# Patient Record
Sex: Female | Born: 1940 | Race: White | Hispanic: No | Smoking: Never smoker
Health system: Southern US, Community
[De-identification: ages and names within clinical notes are randomized; demographics above are authoritative.]

## PROBLEM LIST (undated history)

## (undated) DIAGNOSIS — T8092XA Unspecified transfusion reaction, initial encounter: Secondary | ICD-10-CM

## (undated) DIAGNOSIS — S82209A Unspecified fracture of shaft of unspecified tibia, initial encounter for closed fracture: Secondary | ICD-10-CM

## (undated) DIAGNOSIS — M1990 Unspecified osteoarthritis, unspecified site: Secondary | ICD-10-CM

## (undated) DIAGNOSIS — M810 Age-related osteoporosis without current pathological fracture: Secondary | ICD-10-CM

## (undated) DIAGNOSIS — T7840XA Allergy, unspecified, initial encounter: Secondary | ICD-10-CM

## (undated) DIAGNOSIS — I10 Essential (primary) hypertension: Secondary | ICD-10-CM

## (undated) DIAGNOSIS — M8580 Other specified disorders of bone density and structure, unspecified site: Secondary | ICD-10-CM

## (undated) DIAGNOSIS — R55 Syncope and collapse: Secondary | ICD-10-CM

## (undated) HISTORY — PX: COLONOSCOPY: SHX174

## (undated) HISTORY — DX: Age-related osteoporosis without current pathological fracture: M81.0

## (undated) HISTORY — DX: Unspecified osteoarthritis, unspecified site: M19.90

## (undated) HISTORY — DX: Unspecified fracture of shaft of unspecified tibia, initial encounter for closed fracture: S82.209A

## (undated) HISTORY — DX: Other specified disorders of bone density and structure, unspecified site: M85.80

## (undated) HISTORY — PX: FOOT SURGERY: SHX648

## (undated) HISTORY — DX: Allergy, unspecified, initial encounter: T78.40XA

## (undated) HISTORY — PX: ABDOMINAL HYSTERECTOMY: SHX81

## (undated) HISTORY — DX: Unspecified transfusion reaction, initial encounter: T80.92XA

## (undated) HISTORY — PX: LAPAROSCOPIC BILATERAL SALPINGO OOPHERECTOMY: SHX5890

---
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
## 1998-09-10 ENCOUNTER — Other Ambulatory Visit: Admission: RE | Admit: 1998-09-10 | Discharge: 1998-09-10 | Payer: Self-pay | Admitting: Obstetrics and Gynecology

## 1998-09-17 ENCOUNTER — Encounter: Payer: Self-pay | Admitting: Obstetrics and Gynecology

## 1998-09-17 ENCOUNTER — Ambulatory Visit (HOSPITAL_COMMUNITY): Admission: RE | Admit: 1998-09-17 | Discharge: 1998-09-17 | Payer: Self-pay | Admitting: Obstetrics and Gynecology

## 1999-03-18 ENCOUNTER — Ambulatory Visit (HOSPITAL_COMMUNITY): Admission: RE | Admit: 1999-03-18 | Discharge: 1999-03-18 | Payer: Self-pay | Admitting: Neurosurgery

## 1999-03-18 ENCOUNTER — Encounter: Payer: Self-pay | Admitting: Neurosurgery

## 1999-04-02 ENCOUNTER — Ambulatory Visit (HOSPITAL_COMMUNITY): Admission: RE | Admit: 1999-04-02 | Discharge: 1999-04-02 | Payer: Self-pay | Admitting: Neurosurgery

## 1999-04-02 ENCOUNTER — Encounter: Payer: Self-pay | Admitting: Neurosurgery

## 1999-04-23 ENCOUNTER — Encounter: Payer: Self-pay | Admitting: Neurosurgery

## 1999-04-23 ENCOUNTER — Ambulatory Visit (HOSPITAL_COMMUNITY): Admission: RE | Admit: 1999-04-23 | Discharge: 1999-04-23 | Payer: Self-pay | Admitting: Neurosurgery

## 1999-09-23 ENCOUNTER — Ambulatory Visit (HOSPITAL_COMMUNITY): Admission: RE | Admit: 1999-09-23 | Discharge: 1999-09-23 | Payer: Self-pay | Admitting: Obstetrics and Gynecology

## 1999-09-23 ENCOUNTER — Encounter: Payer: Self-pay | Admitting: Obstetrics and Gynecology

## 1999-09-30 ENCOUNTER — Other Ambulatory Visit: Admission: RE | Admit: 1999-09-30 | Discharge: 1999-09-30 | Payer: Self-pay | Admitting: Obstetrics and Gynecology

## 2000-04-28 ENCOUNTER — Encounter (INDEPENDENT_AMBULATORY_CARE_PROVIDER_SITE_OTHER): Payer: Self-pay

## 2000-04-28 ENCOUNTER — Ambulatory Visit (HOSPITAL_COMMUNITY): Admission: RE | Admit: 2000-04-28 | Discharge: 2000-04-28 | Payer: Self-pay | Admitting: Obstetrics and Gynecology

## 2000-09-26 ENCOUNTER — Encounter: Payer: Self-pay | Admitting: Obstetrics and Gynecology

## 2000-09-26 ENCOUNTER — Ambulatory Visit (HOSPITAL_COMMUNITY): Admission: RE | Admit: 2000-09-26 | Discharge: 2000-09-26 | Payer: Self-pay | Admitting: Obstetrics and Gynecology

## 2000-10-18 ENCOUNTER — Other Ambulatory Visit: Admission: RE | Admit: 2000-10-18 | Discharge: 2000-10-18 | Payer: Self-pay | Admitting: Obstetrics and Gynecology

## 2001-09-28 ENCOUNTER — Ambulatory Visit (HOSPITAL_COMMUNITY): Admission: RE | Admit: 2001-09-28 | Discharge: 2001-09-28 | Payer: Self-pay | Admitting: Obstetrics and Gynecology

## 2001-09-28 ENCOUNTER — Encounter: Payer: Self-pay | Admitting: Obstetrics and Gynecology

## 2001-10-20 ENCOUNTER — Other Ambulatory Visit: Admission: RE | Admit: 2001-10-20 | Discharge: 2001-10-20 | Payer: Self-pay | Admitting: Obstetrics and Gynecology

## 2002-10-01 ENCOUNTER — Ambulatory Visit (HOSPITAL_COMMUNITY): Admission: RE | Admit: 2002-10-01 | Discharge: 2002-10-01 | Payer: Self-pay | Admitting: Obstetrics and Gynecology

## 2002-10-01 ENCOUNTER — Encounter: Payer: Self-pay | Admitting: Obstetrics and Gynecology

## 2002-10-22 ENCOUNTER — Other Ambulatory Visit: Admission: RE | Admit: 2002-10-22 | Discharge: 2002-10-22 | Payer: Self-pay | Admitting: Obstetrics and Gynecology

## 2003-11-18 ENCOUNTER — Ambulatory Visit (HOSPITAL_COMMUNITY): Admission: RE | Admit: 2003-11-18 | Discharge: 2003-11-18 | Payer: Self-pay | Admitting: Obstetrics and Gynecology

## 2003-11-25 ENCOUNTER — Other Ambulatory Visit: Admission: RE | Admit: 2003-11-25 | Discharge: 2003-11-25 | Payer: Self-pay | Admitting: Obstetrics and Gynecology

## 2004-04-06 ENCOUNTER — Ambulatory Visit (HOSPITAL_COMMUNITY): Admission: RE | Admit: 2004-04-06 | Discharge: 2004-04-06 | Payer: Self-pay | Admitting: Gastroenterology

## 2004-08-05 ENCOUNTER — Encounter: Admission: RE | Admit: 2004-08-05 | Discharge: 2004-08-05 | Payer: Self-pay | Admitting: Family Medicine

## 2004-11-18 ENCOUNTER — Ambulatory Visit (HOSPITAL_COMMUNITY): Admission: RE | Admit: 2004-11-18 | Discharge: 2004-11-18 | Payer: Self-pay | Admitting: Obstetrics and Gynecology

## 2004-11-25 ENCOUNTER — Other Ambulatory Visit: Admission: RE | Admit: 2004-11-25 | Discharge: 2004-11-25 | Payer: Self-pay | Admitting: Obstetrics and Gynecology

## 2005-11-19 ENCOUNTER — Ambulatory Visit (HOSPITAL_COMMUNITY): Admission: RE | Admit: 2005-11-19 | Discharge: 2005-11-19 | Payer: Self-pay | Admitting: Obstetrics and Gynecology

## 2005-11-30 ENCOUNTER — Other Ambulatory Visit: Admission: RE | Admit: 2005-11-30 | Discharge: 2005-11-30 | Payer: Self-pay | Admitting: Obstetrics and Gynecology

## 2006-11-21 ENCOUNTER — Ambulatory Visit (HOSPITAL_COMMUNITY): Admission: RE | Admit: 2006-11-21 | Discharge: 2006-11-21 | Payer: Self-pay | Admitting: Obstetrics and Gynecology

## 2006-12-01 ENCOUNTER — Other Ambulatory Visit: Admission: RE | Admit: 2006-12-01 | Discharge: 2006-12-01 | Payer: Self-pay | Admitting: Obstetrics and Gynecology

## 2007-11-28 ENCOUNTER — Ambulatory Visit (HOSPITAL_COMMUNITY): Admission: RE | Admit: 2007-11-28 | Discharge: 2007-11-28 | Payer: Self-pay | Admitting: Obstetrics and Gynecology

## 2008-11-29 ENCOUNTER — Ambulatory Visit (HOSPITAL_COMMUNITY): Admission: RE | Admit: 2008-11-29 | Discharge: 2008-11-29 | Payer: Self-pay | Admitting: Obstetrics and Gynecology

## 2008-12-12 ENCOUNTER — Ambulatory Visit: Payer: Self-pay | Admitting: Obstetrics and Gynecology

## 2008-12-12 ENCOUNTER — Encounter: Payer: Self-pay | Admitting: Obstetrics and Gynecology

## 2008-12-12 ENCOUNTER — Other Ambulatory Visit: Admission: RE | Admit: 2008-12-12 | Discharge: 2008-12-12 | Payer: Self-pay | Admitting: Obstetrics and Gynecology

## 2008-12-17 ENCOUNTER — Ambulatory Visit: Payer: Self-pay | Admitting: Obstetrics and Gynecology

## 2009-12-03 ENCOUNTER — Ambulatory Visit (HOSPITAL_COMMUNITY): Admission: RE | Admit: 2009-12-03 | Discharge: 2009-12-03 | Payer: Self-pay | Admitting: Obstetrics and Gynecology

## 2009-12-15 ENCOUNTER — Ambulatory Visit: Payer: Self-pay | Admitting: Obstetrics and Gynecology

## 2010-10-29 ENCOUNTER — Other Ambulatory Visit: Payer: Self-pay | Admitting: Obstetrics and Gynecology

## 2010-10-29 DIAGNOSIS — Z0000 Encounter for general adult medical examination without abnormal findings: Secondary | ICD-10-CM

## 2010-10-29 DIAGNOSIS — Z1231 Encounter for screening mammogram for malignant neoplasm of breast: Secondary | ICD-10-CM

## 2010-10-31 ENCOUNTER — Encounter: Payer: Self-pay | Admitting: Obstetrics and Gynecology

## 2010-11-30 LAB — HM MAMMOGRAPHY: HM Mammogram: NEGATIVE

## 2010-12-04 ENCOUNTER — Ambulatory Visit (HOSPITAL_COMMUNITY)
Admission: RE | Admit: 2010-12-04 | Discharge: 2010-12-04 | Disposition: A | Discharge status: Home / Self Care | Payer: Medicare Other | Source: Ambulatory Visit | Attending: Obstetrics and Gynecology | Admitting: Obstetrics and Gynecology

## 2010-12-04 DIAGNOSIS — Z1231 Encounter for screening mammogram for malignant neoplasm of breast: Secondary | ICD-10-CM | POA: Insufficient documentation

## 2010-12-17 ENCOUNTER — Other Ambulatory Visit: Payer: Self-pay | Admitting: Obstetrics and Gynecology

## 2010-12-17 ENCOUNTER — Encounter (INDEPENDENT_AMBULATORY_CARE_PROVIDER_SITE_OTHER): Payer: Medicare Other | Admitting: Obstetrics and Gynecology

## 2010-12-17 ENCOUNTER — Other Ambulatory Visit (HOSPITAL_COMMUNITY)
Admission: RE | Admit: 2010-12-17 | Discharge: 2010-12-17 | Disposition: A | Discharge status: Home / Self Care | Payer: Medicare Other | Source: Ambulatory Visit | Attending: Obstetrics and Gynecology | Admitting: Obstetrics and Gynecology

## 2010-12-17 DIAGNOSIS — M949 Disorder of cartilage, unspecified: Secondary | ICD-10-CM

## 2010-12-17 DIAGNOSIS — N814 Uterovaginal prolapse, unspecified: Secondary | ICD-10-CM

## 2010-12-17 DIAGNOSIS — Z124 Encounter for screening for malignant neoplasm of cervix: Secondary | ICD-10-CM

## 2010-12-17 DIAGNOSIS — N952 Postmenopausal atrophic vaginitis: Secondary | ICD-10-CM

## 2010-12-17 DIAGNOSIS — M899 Disorder of bone, unspecified: Secondary | ICD-10-CM

## 2010-12-17 DIAGNOSIS — N8111 Cystocele, midline: Secondary | ICD-10-CM

## 2010-12-17 DIAGNOSIS — R350 Frequency of micturition: Secondary | ICD-10-CM

## 2010-12-31 DIAGNOSIS — Z1211 Encounter for screening for malignant neoplasm of colon: Secondary | ICD-10-CM

## 2011-01-02 LAB — HM PAP SMEAR: HM Pap smear: NORMAL

## 2011-02-02 ENCOUNTER — Ambulatory Visit (INDEPENDENT_AMBULATORY_CARE_PROVIDER_SITE_OTHER): Payer: Medicare Other | Admitting: Family Medicine

## 2011-02-02 ENCOUNTER — Encounter: Payer: Self-pay | Admitting: Family Medicine

## 2011-02-02 VITALS — BP 120/70 | HR 72 | Temp 98.1°F | Resp 12 | Ht 68.0 in | Wt 153.0 lb

## 2011-02-02 DIAGNOSIS — IMO0001 Reserved for inherently not codable concepts without codable children: Secondary | ICD-10-CM

## 2011-02-02 DIAGNOSIS — Z23 Encounter for immunization: Secondary | ICD-10-CM

## 2011-02-02 DIAGNOSIS — E785 Hyperlipidemia, unspecified: Secondary | ICD-10-CM

## 2011-02-02 DIAGNOSIS — M791 Myalgia, unspecified site: Secondary | ICD-10-CM

## 2011-02-02 DIAGNOSIS — Z0000 Encounter for general adult medical examination without abnormal findings: Secondary | ICD-10-CM

## 2011-02-02 LAB — CBC WITH DIFFERENTIAL/PLATELET
Basophils Relative: 0.4 % (ref 0.0–3.0)
Eosinophils Absolute: 0.1 10*3/uL (ref 0.0–0.7)
Hemoglobin: 13.9 g/dL (ref 12.0–15.0)
MCHC: 34.3 g/dL (ref 30.0–36.0)
MCV: 96.4 fl (ref 78.0–100.0)
Monocytes Absolute: 0.5 10*3/uL (ref 0.1–1.0)
Neutro Abs: 5.1 10*3/uL (ref 1.4–7.7)
Neutrophils Relative %: 71.3 % (ref 43.0–77.0)
RBC: 4.21 Mil/uL (ref 3.87–5.11)
RDW: 13 % (ref 11.5–14.6)

## 2011-02-02 LAB — BASIC METABOLIC PANEL
CO2: 28 mEq/L (ref 19–32)
Chloride: 105 mEq/L (ref 96–112)
Creatinine, Ser: 0.7 mg/dL (ref 0.4–1.2)
Glucose, Bld: 96 mg/dL (ref 70–99)

## 2011-02-02 LAB — LIPID PANEL
Cholesterol: 233 mg/dL — ABNORMAL HIGH (ref 0–200)
Total CHOL/HDL Ratio: 3

## 2011-02-02 MED ORDER — TETANUS-DIPHTH-ACELL PERTUSSIS 5-2.5-18.5 LF-MCG/0.5 IM SUSP: 0.5000 mL | Freq: Once | INTRAMUSCULAR | Status: DC

## 2011-02-02 NOTE — Progress Notes (Signed)
Subjective:    Patient ID: Megan Reilly, female    DOB: 05-30-1941, 70 y.o.   MRN: [id number]  HPI Patient is here to establish care and for Medicare wellness visit. She has been generally healthy. He has some osteoarthritis involving the hands. Takes no prescription medications other than rare Atarax for some recurrent urticaria. Immunizations reviewed. She states she has never had chickenpox so shingles vaccine has not being given. Pneumovax 2009. Last tetanus unknown but she thinks over 10 years ago. Colonoscopy 2006.  She has complained of frequent achiness in muscles of both legs. Does not take any prescription medications as above. No claudication symptoms. Arterial Dopplers were done 2 years ago that were reportedly normal. She has no leg edema. Exercises without difficulty. No recent screening lab work. Has had prior elevated total cholesterol around 200 but not checked in several years.  1.  Risk factors based on Past Medical , Social, and Family history  Family history significant for mother having stroke. Father had heart disease at 57. Both parents with hypertension. Father with type 2 diabetes. Patient has no history of smoking. Social alcohol use. 2.  Limitations in physical activities  patient has no limitations. Low risk of falls. Exercises regularly. 3.  Depression/mood no anxiety or depression issues recently 4.  Hearing mild hearing loss and assessed recently with no indication for hearing aid at this time 5.  ADLs fully independent in all 6.  Cognitive function (orientation to time and place, language, writing, speech,memory) patient has no deficits of short or long-term memory. She does have a lisp but she thinks her voice has not really changed her speech not changed in several years. No slurring of speech. Judgment intact. 7.  Home Safety no safety issues identified 8.  Height, weight, and visual acuity. She's lost about 1 inch in height over her whole life. Weight stable. No  visual deficits 9.  Counseling needs tetanus booster. Discussed weightbearing exercise and calcium and vitamin D consumption. 10. Recommendation of preventive services.  Tdap as above. 11. Labs based on risk factors  Lipid, TSH, BMP, and CBC 12. Care Plan  Labs as above.  Tdap.  Discussed shingles vaccine but not indicated if no hx varicella.  Continue with regular exercise. !3.  Advanced Directives.  None yet.  Discussed.    Review of Systems  Constitutional: Negative for fever, chills, activity change, appetite change and unexpected weight change.  HENT: Negative for sore throat and trouble swallowing.   Eyes: Negative for visual disturbance.  Respiratory: Negative for cough and wheezing.   Gastrointestinal: Negative for nausea, vomiting, abdominal pain, diarrhea, constipation, blood in stool and abdominal distention.  Genitourinary: Negative for dysuria.  Musculoskeletal: Negative for back pain.  Skin: Negative for rash.  Neurological: Negative for dizziness, syncope, weakness and headaches.  Hematological: Negative for adenopathy. Does not bruise/bleed easily.  Psychiatric/Behavioral: Negative for confusion and dysphoric mood.       Objective:   Physical Exam  Constitutional: She is oriented to person, place, and time. She appears well-developed and well-nourished. No distress.  HENT:  Right Ear: External ear normal.  Left Ear: External ear normal.  Mouth/Throat: Oropharynx is clear and moist. No oropharyngeal exudate.  Neck: Neck supple. No thyromegaly present.  Cardiovascular: Normal rate, regular rhythm and normal heart sounds.  Exam reveals no gallop.   Pulmonary/Chest: Effort normal and breath sounds normal. No respiratory distress. She has no wheezes. She has no rales.  Musculoskeletal: She exhibits no edema.  Lymphadenopathy:  She has no cervical adenopathy.  Neurological: She is alert and oriented to person, place, and time. No cranial nerve deficit.  Skin: No rash  noted.  Psychiatric: She has a normal mood and affect. Her behavior is normal.          Assessment & Plan:  #1 Medicare wellness visit. Prevention issues discussed as above. Tetanus booster given. Obtain screening lab work. Continue GYN followup with regular mammograms. #2 history reported mild elevated lipids. Recheck lipid panel #3 myalgias mostly involving legs. Check TSH. No evidence for claudication.

## 2011-02-02 NOTE — Patient Instructions (Signed)
Continue with yearly flu vaccine. Continue with yearly mammograms. We will call you with lab results in 1-2 days.

## 2011-02-03 NOTE — Progress Notes (Signed)
Quick Note:  Pt inform and she voiced her understanding ______

## 2011-02-18 ENCOUNTER — Encounter: Payer: Self-pay | Admitting: Family Medicine

## 2011-02-26 NOTE — H&P (Signed)
[facility]  Patient:    Megan Reilly, Megan Reilly                          MRN: [id number] Adm. Date:  04/28/00 Attending:  Rande Brunt. Eda Paschal, M.D.                         History and Physical  CHIEF COMPLAINT:  Persistent left ovarian cyst and endometrial polyp with postmenopausal bleeding.  HISTORY OF PRESENT ILLNESS:  The patient is a 70 year old gravida 6 para 5 ab 1 who has had persistent postmenopausal bleeding. As a result of this, she underwent a sonohysterogram which has shown an intracavitary lesion as the probable source of the postmenopausal bleeding. As a result of this, she enters the hospital for hysteroscopy and excision of endometrial polyp. In addition, because of her spotting, causing her to undergo ultrasound findings for the spotting, she has been followed for an ovarian cyst of her left ovary. Initially, it was a simple cyst of 1.6 x 1.6 cm. However, it has continued to grow under serial exams, and it is now 2.3 x 2.2 x 2.3 cm. Although it remains echo free, there is now calcium in the wall of it. Options have been given the patient including continued observation, but she would like to have it removed because of the fact that I cannot give her an unequivocal guarantee that it is not malignant. Her CA125, however, was normal. She will undergo laparoscopy with left salpingo-oophorectomy, so that it will not be ruptured. However, if at the time of laparoscopy it does not appear that we can do it technically, we will then not continue it, and we will see if she responds to removal of the endometrial polyp in terms of the bleeding; because if she does not respond, she will proceed with oophorectomy, as well as hysterectomy. She does appreciate, however, that once the surgery starts laparoscopically that she may require laparotomy for complications of surgery.  PAST MEDICAL HISTORY:  In August of 1984, she had an exploratory laparotomy done  without a definite diagnosis. At the time of laparotomy, she underwent a right salpingo-oophorectomy and lysis of adhesions. The final pathology report at that time revealed a right tubal abscess; and as noted, she had a right salpingo-oophorectomy. She also had her appendix removed at the same sitting.  PRESENT MEDICATIONS:  Hormonal replacement therapy consisting of both estrogen and progesterone. 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  ALLERGIES:  She is allergic to no drugs.  FAMILY HISTORY:  Father and grandmother are diabetic. Mother is hypertensive. She has an aunt who has had breast cancer.  REVIEW OF SYSTEMS:  HEENT:  Negative. CARDIAC:  Negative. RESPIRATORY: Negative. GI:  Reveals a history of trouble with gas. GU:  Negative. NEUROMUSCULAR:  Back trouble requiring Vioxx. ALLERGIC, IMMUNOLOGICAL, LYMPHATIC, AND ENDOCRINE:  Negative.  SOCIAL HISTORY:  She is a nonsmoker, nondrinker.  PHYSICAL EXAMINATION:  GENERAL:  The patient is a well-developed, well-nourished female in no acute distress.  VITAL SIGNS:  Her blood pressure is 124/70, her pulse is 80 and regular, respirations 16 and non-labored, she is afebrile.  HEENT:  All within normal limits.  NECK:  Supple. Trachea in the midline. Thyroid is not enlarged.  LUNGS:  Clear to auscultation and percussion.  HEART:  No thrills, heaves, or murmurs.  BREASTS:  No masses.  ABDOMEN:  Soft, without guarding or rebound, or masses.  PELVIC:  External and vaginal is within normal limits. Cervix is clean. Pap smear shows no atypia. Uterus is retroverted, normal size and shape. Adnexa are palpably normal.  RECTAL:  Negative.  EXTREMITIES:  Within normal limits.  ADMISSION IMPRESSION: 1. Persistent postmenopausal bleeding with endometrial polyp. 2. Persistent left ovarian cyst with increasing complexity and increase in    size.  PLAN:  Diagnostic  laparoscopy with left salpingo-oophorectomy, hysteroscopy with resection of endometrial polyp. DD:  04/26/00 TD:  04/27/00 Job: [id number]

## 2011-02-26 NOTE — Op Note (Signed)
[facility]  Patient:    Megan Reilly, Megan Reilly                          MRN: [id number] Proc. Date: 04/28/00 Attending:  Rande Brunt. Eda Paschal, M.D.                           Operative Report  PREOPERATIVE DIAGNOSES:  Persistent left ovarian cyst, postmenopausal bleeding, endometrial polyp.  POSTOPERATIVE DIAGNOSES:  Persistent left ovarian cyst, endocervical polyp, postmenopausal bleeding.  OPERATION PERFORMED:  Diagnostic laparoscopy with left salpingo-oophorectomy followed by hysteroscopy with excision of polyp and endometrial sampling.  SURGEON:  Daniel L. Eda Paschal, M.D.  FIRST ASSISTANT:  Timothy P. Fontaine, M.D.  FINDINGS:  At the time of laparoscopy, the patient had 1 band of adhesions involving a small amount of the anterior abdominal wall. The patients uterus was mobile, normal size and shape. The right adnexa was surgically absent. The left adnexa was enlarged by a cyst to approximately 4-5 cm. At the time of hysteroscopy, the patient had an endocervical polyp which was multipolypoid but when the endometrial cavity was visualized the top of the fundus, tubal ostia, anterior and posterior walls of the fundus, lower uterine segment were all without disease.  DESCRIPTION OF PROCEDURE:  After adequate general endotracheal anesthesia, the patient was placed in the dorsal lithotomy position, prepped and draped in the usual sterile manner. A Foley catheter was inserted into the patients bladder and a Hulka catheter was inserted into the patients uterus. A pneumoperitoneum was created with the Veress needle. A 10 mm trocar was placed subumbilically and through that the laparoscope was attached which was attached to the camera. Two 5 mm ports were placed, 1 suprapubically in the midline and 1 in the left lower quadrant. The pelvis was visualized and was as as noted above. Copious irrigation was done and then peritoneal washings were obtained. The 1  adhesions involving the small bowel was lysed staying far from the integrity of the bowel. A bipolar instrument was used which both bipolars and cuts. The adhesions was taken down without any bleeding. Attention was next turned to the left adnexa. The ureter could be easily identified. The infundibulopelvic ligament was bipolar cut. The rest of the attachments to the left adnexa, to the uterus and the broad ligament were bipolar cut and now the specimen was lying free. A 5 mm laparoscope was placed so that an Endopouch could be placed subumbilically and then the specimen was removed intact without any rupture. Copious irrigation was done with Ringers lactate and there was no bleeding. The subumbilical fascial incision was closed with 3 figure-of-eights of #0 Vicryl and all 3 skin incisions were closed with 3-0 monocryl. Attention was next turned to hysteroscopy. The cervix was grasped with a single tooth tenaculum and dilated to a #35 Pratt dilator. At this point, the polyp that had been seen in the office on sonohysterogram was visualized. It was actually an endocervical polyp rather then endometrial and it was removed without difficulty and sent to pathology for tissue diagnosis. The patient was then hysteroscoped and she had a normal endometrial cavity. Three percent sorbitol was used to expand the intrauterine cavity and a camera was used for magnification. Endometrial sampling was obtained and this was also sent to pathology for tissue diagnosis. The procedure was terminated. Estimated blood loss for the entire procedure was less than 100 cc with none replaced. The  fluid deficit was also less than 100 cc. The patient left the operating room in satisfactory condition. DD:  04/28/00 TD:  04/30/00 Job: [id number]

## 2011-02-26 NOTE — Op Note (Signed)
NAME:  Megan Reilly, Megan Reilly                           ACCOUNT NO.:  [id number]   MEDICAL RECORD NO.:  [id number]                   PATIENT TYPE:  AMB   LOCATION:  ENDO                                 FACILITY:  [facility]   PHYSICIAN:  Bernette Redbird, M.D.                DATE OF BIRTH:  07/28/1941   DATE OF PROCEDURE:  04/06/2004  DATE OF DISCHARGE:                                 OPERATIVE REPORT   PROCEDURE:  Colonoscopy.   INDICATIONS:  Screening for colon cancer in an asymptomatic 70 year old  female without any worrisome risk factors.   FINDINGS:  Normal exam to the terminal ileum.   PROCEDURE:  The nature, purpose, and risks of the procedure had previously  been discussed with the patient, who provided written consent.  Sedation was  fentanyl 75 mcg and Versed 7 mg IV without arrhythmias or desaturation.  The  Olympus adjustable-tension pediatric video colonoscope was advanced to the  terminal ileum without significant difficulty, using some external abdominal  compression to control looping.  The terminal ileum had a normal appearance,  and pullback was performed.  The quality of the prep was excellent, and it  is felt that all areas were well-seen.   This was a completely normal examination.  No polyps, cancer, colitis,  vascular malformations, or diverticular disease were observed.  The rectal  ampulla was small and retroflexion was not performed, but antegrade viewing  disclosed no distal rectal lesions.  There were moderate internal  hemorrhoids during pull-out through the anal canal.  The reinspection in the  rectosigmoid was unremarkable.   The patient tolerated the procedure well and there were no apparent  complications.  No biopsies were obtained.   IMPRESSION:  Normal screening colonoscopy in a standard-risk individual  (V76.51).   PLAN:  Consider flexible sigmoidoscopy in five years for continued  screening.                                               Bernette Redbird, M.D.    RB/MEDQ  D:  04/06/2004  T:  04/06/2004  Job:  [id number]   cc:   Marjory Lies, M.D.  [address]  [place]  [place] [address]  Fax: [phone]

## 2011-03-29 ENCOUNTER — Telehealth: Payer: Self-pay | Admitting: Family Medicine

## 2011-03-29 DIAGNOSIS — E876 Hypokalemia: Secondary | ICD-10-CM

## 2011-03-29 NOTE — Telephone Encounter (Signed)
Pt informed CA order done, non-fasting

## 2011-03-29 NOTE — Telephone Encounter (Signed)
Was told that she will need to have her calcium checked. Is this a fasting lab? Please order.

## 2011-03-30 ENCOUNTER — Other Ambulatory Visit (INDEPENDENT_AMBULATORY_CARE_PROVIDER_SITE_OTHER): Payer: Medicare Other

## 2011-04-08 NOTE — Progress Notes (Signed)
Quick Note:  Pt informed ______ 

## 2011-07-22 ENCOUNTER — Encounter: Payer: Self-pay | Admitting: Family Medicine

## 2011-07-22 ENCOUNTER — Ambulatory Visit (INDEPENDENT_AMBULATORY_CARE_PROVIDER_SITE_OTHER): Payer: Medicare Other | Admitting: Family Medicine

## 2011-07-22 VITALS — BP 130/70 | Temp 98.8°F | Wt 154.0 lb

## 2011-07-22 DIAGNOSIS — Z23 Encounter for immunization: Secondary | ICD-10-CM

## 2011-07-22 DIAGNOSIS — J209 Acute bronchitis, unspecified: Secondary | ICD-10-CM

## 2011-07-22 MED ORDER — HYDROCODONE-HOMATROPINE 5-1.5 MG/5ML PO SYRP: 5.0000 mL | ORAL_SOLUTION | Freq: Four times a day (QID) | ORAL | Status: AC | PRN

## 2011-07-22 NOTE — Progress Notes (Signed)
  Subjective:    Patient ID: Megan Reilly, female    DOB: 11-05-1940, 70 y.o.   MRN: [id number]  HPI Acute illness. Five-day history of cough which is dry and nonproductive. She's had minimal if any nasal congestion. No fever. No dyspnea. Patient denies pleuritic pain. Nonsmoker. Has taken Mucinex DM without relief. Cough worse at night. No alleviating factors. Denies any nausea, vomiting, or diarrhea.   Review of Systems  Constitutional: Positive for fatigue. Negative for fever and chills.  Respiratory: Positive for cough. Negative for shortness of breath and wheezing.   Cardiovascular: Negative for chest pain and leg swelling.  Neurological: Negative for dizziness and headaches.       Objective:   Physical Exam  Constitutional: She appears well-developed and well-nourished.  HENT:  Right Ear: External ear normal.  Left Ear: External ear normal.  Mouth/Throat: Oropharynx is clear and moist.  Neck: Neck supple.  Cardiovascular: Normal rate and regular rhythm.   Pulmonary/Chest: Effort normal and breath sounds normal. No respiratory distress. She has no wheezes. She has no rales.  Musculoskeletal: She exhibits no edema.  Lymphadenopathy:    She has no cervical adenopathy.          Assessment & Plan:  Cough probably secondary to acute viral bronchitis. Hycodan for nighttime cough suppression as needed. Followup promptly for fever or worsening cough

## 2011-07-22 NOTE — Patient Instructions (Signed)
Follow up promptly for any fever or worsening symptoms 

## 2011-08-16 ENCOUNTER — Telehealth: Payer: Self-pay | Admitting: Family Medicine

## 2011-08-16 NOTE — Telephone Encounter (Signed)
Pt cannot shake the couhg, sneezing, congestion, etc. I did make her an appt for tomorrow. She'd like to talk to you - see if there's anything you can recommend, because she states she's tried everything she can think of. Thanks.

## 2011-08-16 NOTE — Telephone Encounter (Signed)
Discussed pt, she is going to keep appt tomorrow, she has tried everything, nothing helping.  Rercommended trying a benadryl tonight.

## 2011-08-17 ENCOUNTER — Ambulatory Visit (INDEPENDENT_AMBULATORY_CARE_PROVIDER_SITE_OTHER): Payer: Medicare Other | Admitting: Family Medicine

## 2011-08-17 ENCOUNTER — Encounter: Payer: Self-pay | Admitting: Family Medicine

## 2011-08-17 VITALS — BP 140/80 | Temp 98.9°F | Wt 154.0 lb

## 2011-08-17 DIAGNOSIS — R059 Cough, unspecified: Secondary | ICD-10-CM

## 2011-08-17 DIAGNOSIS — R05 Cough: Secondary | ICD-10-CM

## 2011-08-17 MED ORDER — AZELASTINE HCL 0.1 % NA SOLN: 2.0000 | Freq: Two times a day (BID) | NASAL | Status: DC

## 2011-08-17 NOTE — Patient Instructions (Signed)
Let me know in 2 weeks if cough not resolving. May continue with over the counter antihistamines.

## 2011-08-17 NOTE — Progress Notes (Signed)
  Subjective:    Patient ID: Megan Reilly, female    DOB: 1941/07/23, 70 y.o.   MRN: [id number]  HPI  Intermittent cough for the past month. Frequent sinus congestion with postnasal drip. No facial pain. No headaches. Denies fever or chills. Took Sudafed without much relief. Has also tried Zyrtec, Benadryl, and Atarax without much relief. Denies GERD symptoms. No wheezing. No dyspnea, pleuritic pain, appetite, or weight changes. Nonsmoker.   Review of Systems  Constitutional: Negative for fever, chills, appetite change and unexpected weight change.  HENT: Positive for congestion, rhinorrhea and postnasal drip. Negative for sore throat and voice change.   Respiratory: Positive for cough. Negative for shortness of breath and wheezing.   Gastrointestinal: Negative for abdominal pain.  Neurological: Negative for headaches.       Objective:   Physical Exam  Constitutional: She appears well-developed and well-nourished.  HENT:  Right Ear: External ear normal.  Left Ear: External ear normal.  Mouth/Throat: Oropharynx is clear and moist.  Neck: Neck supple.  Cardiovascular: Normal rate and regular rhythm.   Pulmonary/Chest: Effort normal and breath sounds normal. No respiratory distress. She has no wheezes. She has no rales.  Musculoskeletal: She exhibits no edema.  Lymphadenopathy:    She has no cervical adenopathy.          Assessment & Plan:  Cough probably secondary to allergic postnasal drip. Astelin nasal 2 sprays per nostril twice daily. Continue nighttime use of Atarax or Benadryl. Consider chest x-ray 2 weeks if no better

## 2011-08-25 ENCOUNTER — Telehealth: Payer: Self-pay | Admitting: *Deleted

## 2011-08-25 NOTE — Telephone Encounter (Signed)
Letter written in Dr Lucie Leather one week absence.

## 2011-08-25 NOTE — Telephone Encounter (Signed)
Pt called to request a letter she can provide for her insurance is an effort to get reimbursed for Tdap given at OV.  BC/BS had denied payment.  Per Dr Caryl Never, Tdap is considered medically necessary for parents and grandparents with the need for protection from whooping cough with pertussis.  Will write letter  Letter written, saved to chart and mailed to pt home as requested [facility]

## 2011-11-03 ENCOUNTER — Other Ambulatory Visit: Payer: Self-pay | Admitting: Obstetrics and Gynecology

## 2011-11-03 DIAGNOSIS — Z1231 Encounter for screening mammogram for malignant neoplasm of breast: Secondary | ICD-10-CM

## 2011-12-08 ENCOUNTER — Telehealth: Payer: Self-pay | Admitting: *Deleted

## 2011-12-08 ENCOUNTER — Ambulatory Visit (HOSPITAL_COMMUNITY)
Admission: RE | Admit: 2011-12-08 | Discharge: 2011-12-08 | Disposition: A | Discharge status: Home / Self Care | Payer: Medicare Other | Source: Ambulatory Visit | Attending: Obstetrics and Gynecology | Admitting: Obstetrics and Gynecology

## 2011-12-08 DIAGNOSIS — Z1231 Encounter for screening mammogram for malignant neoplasm of breast: Secondary | ICD-10-CM | POA: Insufficient documentation

## 2011-12-08 NOTE — Telephone Encounter (Signed)
Pt called asking if she had pap smear done in 2012. Pt informed that she did have pap in 2012

## 2012-05-22 ENCOUNTER — Telehealth: Payer: Self-pay | Admitting: Family Medicine

## 2012-05-22 NOTE — Telephone Encounter (Signed)
Caller: [name]/Patient; Patient Name: Megan Reilly; PCP: Evelena Peat; Best Callback Phone Number: [phone].  Started low abdominal pressure about 8:30 pm last evening 8/11, noted frequency so took Allergy medicine and went to bed.  Up every 45 minutes during the night to urinate.  Today heaviness in lower abdomen, pelvis and urinating about every 20 minutes.  Has increased fluids.  Triaged in Urinary Symptoms Female Guideline - Disposition:  See Provider Within 24 Hours due ot one or more urinary tract symptoms and not been previously evaluated.  Gave care information for urinary symptoms.  Appointment at 11:15am tomorrow 8/13 Dr Caryl Never.

## 2012-05-23 ENCOUNTER — Ambulatory Visit (INDEPENDENT_AMBULATORY_CARE_PROVIDER_SITE_OTHER): Payer: Medicare Other | Admitting: Family Medicine

## 2012-05-23 ENCOUNTER — Encounter: Payer: Self-pay | Admitting: Family Medicine

## 2012-05-23 VITALS — BP 138/70 | Temp 98.4°F | Wt 152.0 lb

## 2012-05-23 DIAGNOSIS — R300 Dysuria: Secondary | ICD-10-CM

## 2012-05-23 LAB — POCT URINALYSIS DIPSTICK
Glucose, UA: NEGATIVE
Nitrite, UA: NEGATIVE

## 2012-05-23 MED ORDER — CIPROFLOXACIN HCL 500 MG PO TABS: 500.0000 mg | ORAL_TABLET | Freq: Two times a day (BID) | ORAL | Status: AC

## 2012-05-23 NOTE — Patient Instructions (Addendum)

## 2012-05-23 NOTE — Progress Notes (Signed)
  Subjective:    Patient ID: Megan Reilly, female    DOB: April 11, 1941, 71 y.o.   MRN: [id number]  HPI  Acute visit. Two day history of dysuria. Urine frequency. Occasional very mild burning with urination but mostly suprapubic pressure. Question low-grade fever last night. No nausea or vomiting. No vaginal discharge. No gross hematuria. Only one UTI previously many years ago. Patient has no known drug allergies. No back pain   Review of Systems  Constitutional: Negative for fever, chills and appetite change.  Gastrointestinal: Negative for nausea, vomiting, abdominal pain, diarrhea and constipation.  Genitourinary: Positive for dysuria and frequency.  Musculoskeletal: Negative for back pain.  Neurological: Negative for dizziness.       Objective:   Physical Exam  Constitutional: She appears well-developed and well-nourished.  HENT:  Head: Normocephalic and atraumatic.  Neck: Neck supple. No thyromegaly present.  Cardiovascular: Normal rate, regular rhythm and normal heart sounds.   Pulmonary/Chest: Breath sounds normal.  Abdominal: Soft. Bowel sounds are normal. There is no tenderness.          Assessment & Plan:  Uncomplicated cystitis. Urine culture sent. Cipro 500 mg twice a day for 7 days. Patient will schedule wellness evaluation

## 2012-05-26 LAB — URINE CULTURE

## 2012-08-09 ENCOUNTER — Ambulatory Visit (INDEPENDENT_AMBULATORY_CARE_PROVIDER_SITE_OTHER): Payer: Medicare Other | Admitting: Family Medicine

## 2012-08-09 ENCOUNTER — Encounter: Payer: Self-pay | Admitting: Family Medicine

## 2012-08-09 VITALS — BP 136/76 | HR 72 | Temp 97.8°F | Resp 12 | Ht 68.0 in | Wt 153.0 lb

## 2012-08-09 DIAGNOSIS — Z862 Personal history of diseases of the blood and blood-forming organs and certain disorders involving the immune mechanism: Secondary | ICD-10-CM

## 2012-08-09 DIAGNOSIS — Z23 Encounter for immunization: Secondary | ICD-10-CM

## 2012-08-09 DIAGNOSIS — Z0000 Encounter for general adult medical examination without abnormal findings: Secondary | ICD-10-CM

## 2012-08-09 DIAGNOSIS — Z8639 Personal history of other endocrine, nutritional and metabolic disease: Secondary | ICD-10-CM

## 2012-08-09 LAB — HEPATIC FUNCTION PANEL
ALT: 17 U/L (ref 0–35)
AST: 25 U/L (ref 0–37)
Alkaline Phosphatase: 57 U/L (ref 39–117)
Bilirubin, Direct: 0.1 mg/dL (ref 0.0–0.3)
Total Bilirubin: 0.9 mg/dL (ref 0.3–1.2)

## 2012-08-09 LAB — BASIC METABOLIC PANEL
Chloride: 107 mEq/L (ref 96–112)
Creatinine, Ser: 0.7 mg/dL (ref 0.4–1.2)
Potassium: 5.3 mEq/L — ABNORMAL HIGH (ref 3.5–5.1)
Sodium: 142 mEq/L (ref 135–145)

## 2012-08-09 NOTE — Patient Instructions (Signed)
Continue with yearly flu vaccine Complete Hemoccult cards Continue regular aerobic exercise Consider IgG Varicella antibody test to confirm if prior chickenpox infection

## 2012-08-09 NOTE — Progress Notes (Signed)
  Subjective:    Patient ID: Megan Reilly, female    DOB: 02/28/1941, 71 y.o.   MRN: [id number]  HPI  Patient here for physical exam. She continues to see gynecologist. Mammogram last year normal. She continues to get Pap smear every other year. Aerobic exercise 3 days per week. Nonsmoker. No recent falls. Takes Astelin for seasonal allergies but no other prescription medications. Takes calcium and vitamin D supplement. Last year labs revealed mild elevated calcium 10.9 and on repeat 10.3.  Past Medical History  Diagnosis Date  . Arthritis   . Allergy   . Blood transfusion abn reaction or complication, no procedure mishap     71 years old after tonsilectomy   Past Surgical History  Procedure Date  . Tonsillectomy 1945  . Abdominal hysterectomy     partial 1983  . Foot surgery     bunectomy, hammertoe, bil    reports that she has never smoked. She does not have any smokeless tobacco history on file. Her alcohol and drug histories not on file. family history includes Diabetes in her father; Heart disease in her mother; Heart disease (age of onset:67) in her father; Hyperlipidemia in her father and mother; and Stroke in her mother. No Known Allergies    Review of Systems  Constitutional: Negative for fever, activity change, appetite change, fatigue and unexpected weight change.  HENT: Negative for hearing loss, ear pain, sore throat and trouble swallowing.   Eyes: Negative for visual disturbance.  Respiratory: Negative for cough and shortness of breath.   Cardiovascular: Negative for chest pain and palpitations.  Gastrointestinal: Negative for abdominal pain, diarrhea, constipation and blood in stool.  Genitourinary: Negative for dysuria and hematuria.  Musculoskeletal: Negative for myalgias, back pain and arthralgias.  Skin: Negative for rash.  Neurological: Negative for dizziness, syncope and headaches.  Hematological: Negative for adenopathy. Bruises/bleeds easily (With  aspirin).  Psychiatric/Behavioral: Negative for confusion and dysphoric mood.       Objective:   Physical Exam  Constitutional: She is oriented to person, place, and time. She appears well-developed and well-nourished.  HENT:  Head: Normocephalic and atraumatic.  Eyes: EOM are normal. Pupils are equal, round, and reactive to light.  Neck: Normal range of motion. Neck supple. No thyromegaly present.  Cardiovascular: Normal rate, regular rhythm and normal heart sounds.   No murmur heard. Pulmonary/Chest: Breath sounds normal. No respiratory distress. She has no wheezes. She has no rales.  Abdominal: Soft. Bowel sounds are normal. She exhibits no distension and no mass. There is no tenderness. There is no rebound and no guarding.  Genitourinary:       Per GYN  Musculoskeletal: Normal range of motion. She exhibits no edema.  Lymphadenopathy:    She has no cervical adenopathy.  Neurological: She is alert and oriented to person, place, and time. She displays normal reflexes. No cranial nerve deficit.  Skin: No rash noted.  Psychiatric: She has a normal mood and affect. Her behavior is normal. Judgment and thought content normal.          Assessment & Plan:  Health maintenance. Continue yearly mammogram. Immunizations up to date but no history of shingles vaccine. Patient does not think she's had previous chickenpox. Check with insurance regarding coverage for IgG Varicella antibody. Flu vaccine given. Repeat basic metabolic panel with prior history of mild hypercalcemia

## 2012-08-10 ENCOUNTER — Telehealth: Payer: Self-pay | Admitting: Family Medicine

## 2012-08-10 DIAGNOSIS — Z7189 Other specified counseling: Secondary | ICD-10-CM

## 2012-08-10 DIAGNOSIS — Z7185 Encounter for immunization safety counseling: Secondary | ICD-10-CM

## 2012-08-10 NOTE — Telephone Encounter (Signed)
Caller: Puneet/Patient; Patient Name: Megan Reilly; PCP: Evelena Peat [facility]); Best Callback Phone Number: [phone].  Follow up call from office visit of 08-09-12 regarding IgG Varicell antibody testing.  Per Dr Lucie Leather note in EPIC, 10-30 visit, MD wanted Pt to follow up w/ Insurance.  Pt's insurance will pay for IGG Antibody testing for Chickenpox.  Pt would like to schedule appt. PLEASE FOLLOW UP W/ PT FOR APPT NEEDS TO HAVE BLOOD DRAWN.

## 2012-08-10 NOTE — Telephone Encounter (Signed)
I will order and let pt know and she can schedule.

## 2012-08-10 NOTE — Progress Notes (Signed)
Quick Note:  Pt informed ______ 

## 2012-08-10 NOTE — Telephone Encounter (Signed)
Pt informed, scheduling appt now

## 2012-08-10 NOTE — Telephone Encounter (Signed)
Please advise 

## 2012-08-14 ENCOUNTER — Other Ambulatory Visit (INDEPENDENT_AMBULATORY_CARE_PROVIDER_SITE_OTHER): Payer: Medicare Other

## 2012-08-14 DIAGNOSIS — Z7185 Encounter for immunization safety counseling: Secondary | ICD-10-CM

## 2012-08-14 DIAGNOSIS — Z862 Personal history of diseases of the blood and blood-forming organs and certain disorders involving the immune mechanism: Secondary | ICD-10-CM

## 2012-08-14 DIAGNOSIS — Z8639 Personal history of other endocrine, nutritional and metabolic disease: Secondary | ICD-10-CM

## 2012-08-14 DIAGNOSIS — Z7189 Other specified counseling: Secondary | ICD-10-CM

## 2012-08-14 LAB — HEMOCCULT GUIAC POC 1CARD (OFFICE)
Card #3 Fecal Occult Blood, POC: NEGATIVE
Fecal Occult Blood, POC: NEGATIVE

## 2012-08-14 NOTE — Progress Notes (Signed)
Quick Note:  Pt informed ______ 

## 2012-08-16 NOTE — Progress Notes (Signed)
Quick Note:  Pt requested we mail her Rx for Zostavax now that she has found out she has had chicken pox ______

## 2012-09-21 ENCOUNTER — Ambulatory Visit (INDEPENDENT_AMBULATORY_CARE_PROVIDER_SITE_OTHER): Payer: Medicare Other | Admitting: Family Medicine

## 2012-09-21 ENCOUNTER — Encounter: Payer: Self-pay | Admitting: Family Medicine

## 2012-09-21 VITALS — BP 140/80 | Temp 99.0°F

## 2012-09-21 DIAGNOSIS — R05 Cough: Secondary | ICD-10-CM

## 2012-09-21 DIAGNOSIS — J309 Allergic rhinitis, unspecified: Secondary | ICD-10-CM

## 2012-09-21 DIAGNOSIS — R059 Cough, unspecified: Secondary | ICD-10-CM

## 2012-09-21 MED ORDER — AZELASTINE HCL 0.1 % NA SOLN: 2.0000 | Freq: Two times a day (BID) | NASAL | Status: DC

## 2012-09-21 NOTE — Patient Instructions (Addendum)
Allergic Rhinitis  Allergic rhinitis is when the mucous membranes in the nose respond to allergens. Allergens are particles in the air that cause your body to have an allergic reaction. This causes you to release allergic antibodies. Through a chain of events, these eventually cause you to release histamine into the blood stream (hence the use of antihistamines). Although meant to be protective to the body, it is this release that causes your discomfort, such as frequent sneezing, congestion and an itchy runny nose.    CAUSES    The pollen allergens may come from grasses, trees, and weeds. This is seasonal allergic rhinitis, or "hay fever." Other allergens cause year-round allergic rhinitis (perennial allergic rhinitis) such as house dust mite allergen, pet dander and mold spores.    SYMPTOMS     Nasal stuffiness (congestion).   Runny, itchy nose with sneezing and tearing of the eyes.   There is often an itching of the mouth, eyes and ears.  It cannot be cured, but it can be controlled with medications.  DIAGNOSIS    If you are unable to determine the offending allergen, skin or blood testing may find it.  TREATMENT     Avoid the allergen.   Medications and allergy shots (immunotherapy) can help.   Hay fever may often be treated with antihistamines in pill or nasal spray forms. Antihistamines block the effects of histamine. There are over-the-counter medicines that may help with nasal congestion and swelling around the eyes. Check with your caregiver before taking or giving this medicine.  If the treatment above does not work, there are many new medications your caregiver can prescribe. Stronger medications may be used if initial measures are ineffective. Desensitizing injections can be used if medications and avoidance fails. Desensitization is when a patient is given ongoing shots until the body becomes less sensitive to the allergen. Make sure you follow up with your caregiver if problems continue.   SEEK MEDICAL CARE IF:     You develop fever (more than 100.5 F (38.1 C).   You develop a cough that does not stop easily (persistent).   You have shortness of breath.   You start wheezing.   Symptoms interfere with normal daily activities.  Document Released: 06/22/2001 Document Revised: 12/20/2011 Document Reviewed: 01/01/2009  [employer] Patient Information 2013 [employer].

## 2012-09-21 NOTE — Progress Notes (Signed)
  Subjective:    Patient ID: Megan Reilly, female    DOB: 05-Sep-1941, 71 y.o.   MRN: [id number]  HPI Onset Monday PND and cough.  No sore throat.  No fever or chills.  Cough is mostly nonproductive. No dyspnea.  Sneezing.  Hx perennial allergies. No history of asthma. Nonsmoker. Denies any body aches  Past Medical History  Diagnosis Date  . Arthritis   . Allergy   . Blood transfusion abn reaction or complication, no procedure mishap     71 years old after tonsilectomy   Past Surgical History  Procedure Date  . Tonsillectomy 1945  . Abdominal hysterectomy     partial 1983  . Foot surgery     bunectomy, hammertoe, bil    reports that she has never smoked. She does not have any smokeless tobacco history on file. Her alcohol and drug histories not on file. family history includes Diabetes in her father; Heart disease in her mother; Heart disease (age of onset:67) in her father; Hyperlipidemia in her father and mother; and Stroke in her mother. No Known Allergies    Review of Systems  Constitutional: Negative for fever, chills and unexpected weight change.  HENT: Positive for congestion. Negative for ear pain and voice change.   Respiratory: Positive for cough. Negative for shortness of breath and wheezing.   Neurological: Negative for headaches.       Objective:   Physical Exam  Constitutional: She appears well-developed and well-nourished.  HENT:  Right Ear: External ear normal.  Left Ear: External ear normal.  Mouth/Throat: Oropharynx is clear and moist.  Neck: Neck supple.  Cardiovascular: Normal rate and regular rhythm.   Pulmonary/Chest: Effort normal and breath sounds normal. No respiratory distress. She has no wheezes. She has no rales.  Musculoskeletal: She exhibits no edema.  Lymphadenopathy:    She has no cervical adenopathy.          Assessment & Plan:  Rhinitis. Suspect allergic. She has associated cough probably related to postnasal drip. Astelin nasal  2 sprays per nostril twice daily as needed. Continue over-the-counter Allegra

## 2012-09-21 NOTE — Progress Notes (Signed)
Rx called in 

## 2012-10-09 ENCOUNTER — Other Ambulatory Visit: Payer: Self-pay | Admitting: Dermatology

## 2012-10-09 DIAGNOSIS — C4491 Basal cell carcinoma of skin, unspecified: Secondary | ICD-10-CM

## 2012-10-09 HISTORY — DX: Basal cell carcinoma of skin, unspecified: C44.91

## 2012-11-10 ENCOUNTER — Other Ambulatory Visit: Payer: Self-pay | Admitting: Women's Health

## 2012-11-10 DIAGNOSIS — Z1231 Encounter for screening mammogram for malignant neoplasm of breast: Secondary | ICD-10-CM

## 2012-12-08 ENCOUNTER — Ambulatory Visit (HOSPITAL_COMMUNITY)
Admission: RE | Admit: 2012-12-08 | Discharge: 2012-12-08 | Disposition: A | Discharge status: Home / Self Care | Payer: Medicare Other | Source: Ambulatory Visit | Attending: Women's Health | Admitting: Women's Health

## 2012-12-08 DIAGNOSIS — Z1231 Encounter for screening mammogram for malignant neoplasm of breast: Secondary | ICD-10-CM | POA: Insufficient documentation

## 2012-12-20 ENCOUNTER — Ambulatory Visit (INDEPENDENT_AMBULATORY_CARE_PROVIDER_SITE_OTHER): Payer: Medicare Other | Admitting: Women's Health

## 2012-12-20 ENCOUNTER — Encounter: Payer: Self-pay | Admitting: Women's Health

## 2012-12-20 VITALS — BP 104/64 | Ht 68.5 in | Wt 150.0 lb

## 2012-12-20 DIAGNOSIS — M949 Disorder of cartilage, unspecified: Secondary | ICD-10-CM

## 2012-12-20 DIAGNOSIS — M899 Disorder of bone, unspecified: Secondary | ICD-10-CM

## 2012-12-20 DIAGNOSIS — M810 Age-related osteoporosis without current pathological fracture: Secondary | ICD-10-CM | POA: Insufficient documentation

## 2012-12-20 DIAGNOSIS — M8580 Other specified disorders of bone density and structure, unspecified site: Secondary | ICD-10-CM

## 2012-12-20 DIAGNOSIS — N8111 Cystocele, midline: Secondary | ICD-10-CM

## 2012-12-20 DIAGNOSIS — IMO0002 Reserved for concepts with insufficient information to code with codable children: Secondary | ICD-10-CM

## 2012-12-20 NOTE — Progress Notes (Signed)
MARQUETTE PIONTEK [birth year] [id number]    History:    The patient presents for Breast and pelvic exam. BSO for benign cysts. No HRT/no bleeding. History of normal Paps and mammograms. Osteopenia T score bilateral hip average -0.83/2010. FRAX  8.8%/1%.negativecolonoscopy 2006. Current on all vaccines.   Past medical history, past surgical history, family history and social history were all reviewed and documented in the EPIC chart. Has 4 sons and one daughter.   Exam:  Filed Vitals:   12/20/12 1044  BP: 104/64    General appearance:  Normal Head/Neck:  Normal, without cervical or supraclavicular adenopathy. Thyroid:  Symmetrical, normal in size, without palpable masses or nodularity. Respiratory  Effort:  Normal  Auscultation:  Clear without wheezing or rhonchi Cardiovascular  Auscultation:  Regular rate, without rubs, murmurs or gallops  Edema/varicosities:  Not grossly evident Abdominal  Soft,nontender, without masses, guarding or rebound.  Liver/spleen:  No organomegaly noted  Hernia:  None appreciated  Skin  Inspection:  Grossly normal  Palpation:  Grossly normal Neurologic/psychiatric  Orientation:  Normal with appropriate conversation.  Mood/affect:  Normal  Genitourinary    Breasts: Examined lying and sitting.     Right: Without masses, retractions, discharge or axillary adenopathy.     Left: Without masses, retractions, discharge or axillary adenopathy.   Inguinal/mons:  Normal without inguinal adenopathy  External genitalia:  Normal  BUS/Urethra/Skene's glands:  Normal  Bladder:  Normal  Vagina:  +1 cystocele  Cervix:  Normal  Uterus:   normal in size, shape and contour.  Midline and mobile  Adnexa/parametria:     Rt: Without masses or tenderness.   Lt: Without masses or tenderness.  Anus and perineum: Normal  Digital rectal exam: Normal sphincter tone without palpated masses or tenderness  Assessment/Plan:  72 y.o. D. WF G6 P5 for breast and pelvic exam  with no complaints.  Minimal  symptomatic +1 cystocele Osteopenia  Plan: DEXA, will schedule. Home safety, fall prevention and importance of continued exercise and vitamin D 2000 daily reviewed. SBE's, continue annual mammogram, calcium rich diet encouraged. Labs at primary care. Normal Pap 2012, reviewed new screening guidelines.   Harrington Challenger [facility], 12:04 PM 12/20/2012

## 2012-12-20 NOTE — Patient Instructions (Addendum)

## 2013-01-16 ENCOUNTER — Other Ambulatory Visit: Payer: Self-pay | Admitting: Gynecology

## 2013-01-16 ENCOUNTER — Ambulatory Visit (INDEPENDENT_AMBULATORY_CARE_PROVIDER_SITE_OTHER): Payer: Medicare Other

## 2013-01-16 DIAGNOSIS — M8580 Other specified disorders of bone density and structure, unspecified site: Secondary | ICD-10-CM

## 2013-01-16 DIAGNOSIS — M899 Disorder of bone, unspecified: Secondary | ICD-10-CM

## 2013-01-18 ENCOUNTER — Other Ambulatory Visit: Payer: Self-pay | Admitting: *Deleted

## 2013-01-18 DIAGNOSIS — M8580 Other specified disorders of bone density and structure, unspecified site: Secondary | ICD-10-CM

## 2013-01-19 ENCOUNTER — Other Ambulatory Visit: Payer: Medicare Other

## 2013-01-19 ENCOUNTER — Ambulatory Visit (INDEPENDENT_AMBULATORY_CARE_PROVIDER_SITE_OTHER): Payer: Medicare Other | Admitting: Family Medicine

## 2013-01-19 DIAGNOSIS — M8580 Other specified disorders of bone density and structure, unspecified site: Secondary | ICD-10-CM

## 2013-01-19 DIAGNOSIS — Z299 Encounter for prophylactic measures, unspecified: Secondary | ICD-10-CM

## 2013-01-22 LAB — TB SKIN TEST: TB Skin Test: NEGATIVE

## 2013-01-30 ENCOUNTER — Encounter: Payer: Self-pay | Admitting: Gynecology

## 2013-01-30 ENCOUNTER — Ambulatory Visit (INDEPENDENT_AMBULATORY_CARE_PROVIDER_SITE_OTHER): Payer: Medicare Other | Admitting: Gynecology

## 2013-01-30 VITALS — BP 130/74

## 2013-01-30 DIAGNOSIS — M545 Low back pain, unspecified: Secondary | ICD-10-CM

## 2013-01-30 DIAGNOSIS — Z8262 Family history of osteoporosis: Secondary | ICD-10-CM

## 2013-01-30 DIAGNOSIS — M8580 Other specified disorders of bone density and structure, unspecified site: Secondary | ICD-10-CM

## 2013-01-30 DIAGNOSIS — R29890 Loss of height: Secondary | ICD-10-CM

## 2013-01-30 DIAGNOSIS — M533 Sacrococcygeal disorders, not elsewhere classified: Secondary | ICD-10-CM

## 2013-01-30 DIAGNOSIS — M899 Disorder of bone, unspecified: Secondary | ICD-10-CM

## 2013-01-30 NOTE — Patient Instructions (Addendum)
Osteoporosis  Throughout your life, your body breaks down old bone and replaces it with new bone. As you get older, your body does not replace bone as quickly as it breaks it down. By the age of 30 years, most people begin to gradually lose bone because of the imbalance between bone loss and replacement. Some people lose more bone than others. Bone loss beyond a specified normal degree is considered osteoporosis.    Osteoporosis affects the strength and durability of your bones. The inside of the ends of your bones and your flat bones, like the bones of your pelvis, look like honeycomb, filled with tiny open spaces. As bone loss occurs, your bones become less dense. This means that the open spaces inside your bones become bigger and the walls between these spaces become thinner. This makes your bones weaker. Bones of a person with osteoporosis can become so weak that they can break (fracture) during minor accidents, such as a simple fall.  CAUSES    The following factors have been associated with the development of osteoporosis:   Smoking.   Drinking more than 2 alcoholic drinks several days per week.   Long-term use of certain medicines:   Corticosteroids.   Chemotherapy medicines.   Thyroid medicines.   Antiepileptic medicines.   Gonadal hormone suppression medicine.   Immunosuppression medicine.   Being underweight.   Lack of physical activity.   Lack of exposure to the sun. This can lead to vitamin D deficiency.   Certain medical conditions:   Certain inflammatory bowel diseases, such as Crohn's disease and ulcerative colitis.   Diabetes.   Hyperthyroidism.   Hyperparathyroidism.  RISK FACTORS  Anyone can develop osteoporosis. However, the following factors can increase your risk of developing osteoporosis:   Gender Women are at higher risk than men.   Age Being older than 50 years increases your risk.   Ethnicity White and Asian people have an increased risk.    Weight Being extremely underweight can increase your risk of osteoporosis.   Family history of osteoporosis Having a family member who has developed osteoporosis can increase your risk.  SYMPTOMS    Usually, people with osteoporosis have no symptoms.    DIAGNOSIS    Signs during a physical exam that may prompt your caregiver to suspect osteoporosis include:   Decreased height. This is usually caused by the compression of the bones that form your spine (vertebrae) because they have weakened and become fractured.   A curving or rounding of the upper back (kyphosis).  To confirm signs of osteoporosis, your caregiver may request a procedure that uses 2 low-dose X-ray beams with different levels of energy to measure your bone mineral density (dual-energy X-ray absorptiometry [DXA]). Also, your caregiver may check your level of vitamin D.  TREATMENT    The goal of osteoporosis treatment is to strengthen bones in order to decrease the risk of bone fractures. There are different types of medicines available to help achieve this goal. Some of these medicines work by slowing the processes of bone loss. Some medicines work by increasing bone density. Treatment also involves making sure that your levels of calcium and vitamin D are adequate.  PREVENTION    There are things you can do to help prevent osteoporosis. Adequate intake of calcium and vitamin D can help you achieve optimal bone mineral density. Regular exercise can also help, especially resistance and high-impact activities. If you smoke, quitting smoking is an important part of osteoporosis prevention.  MAKE 

## 2013-01-30 NOTE — Progress Notes (Signed)
Patient is a 72 year old who presented to the office today to discuss the results of her recent bone density study. Patient has complained of low back discomfort for quite some time she is exercising regularly. She is taking her calcium and vitamin D regularly as well. Her mother has history of osteoporosis. Patient believes that she has lost about an inch since last year and her height.  Her bone density done recently here in [facility] gynecology was compare with previous study of 2010. There appears to be statistically significant decrease in her bone mineralization of the one third distal forearm. The lowest T score was at this region of interest with a value of -1.6 (osteopenia). She had a normal Frax analysis   We had a lengthy discussion on osteopenia and osteoporosis. She is very adamant ongoing on any and that resulted agent. I discussed with her the following concerns that I have and thus would recommend a vertebral fracture analysis (VFA). 1. Osteopenia #2 postmenopausal #3 mother with history of osteoporosis #4 low back pain #5 height loss  Have recommended she continue on her calcium and vitamin D regimen not to exceed 1200 mg of calcium daily or 2000 units of vitamin D daily. She should continue with her weightbearing exercises 3 or 4 times a week. She is going to check with her insurance to see if they will cover for the vertebral fracture and analysis of not she will wait 2 years from now to have it done.

## 2013-02-15 ENCOUNTER — Other Ambulatory Visit: Payer: Self-pay | Admitting: Gynecology

## 2013-02-15 DIAGNOSIS — M8580 Other specified disorders of bone density and structure, unspecified site: Secondary | ICD-10-CM

## 2013-02-27 ENCOUNTER — Other Ambulatory Visit: Payer: Self-pay | Admitting: Gynecology

## 2013-02-27 ENCOUNTER — Ambulatory Visit (INDEPENDENT_AMBULATORY_CARE_PROVIDER_SITE_OTHER): Payer: Medicare Other

## 2013-02-27 DIAGNOSIS — M8580 Other specified disorders of bone density and structure, unspecified site: Secondary | ICD-10-CM

## 2013-02-27 DIAGNOSIS — M899 Disorder of bone, unspecified: Secondary | ICD-10-CM

## 2013-06-26 ENCOUNTER — Ambulatory Visit (INDEPENDENT_AMBULATORY_CARE_PROVIDER_SITE_OTHER): Payer: Medicare Other

## 2013-06-26 DIAGNOSIS — Z23 Encounter for immunization: Secondary | ICD-10-CM

## 2013-09-04 ENCOUNTER — Ambulatory Visit (INDEPENDENT_AMBULATORY_CARE_PROVIDER_SITE_OTHER): Payer: Medicare Other | Admitting: Family Medicine

## 2013-09-04 ENCOUNTER — Encounter: Payer: Self-pay | Admitting: Family Medicine

## 2013-09-04 VITALS — BP 120/84 | Temp 98.0°F | Ht 67.75 in | Wt 152.0 lb

## 2013-09-04 DIAGNOSIS — M8580 Other specified disorders of bone density and structure, unspecified site: Secondary | ICD-10-CM

## 2013-09-04 DIAGNOSIS — Z0000 Encounter for general adult medical examination without abnormal findings: Secondary | ICD-10-CM

## 2013-09-04 DIAGNOSIS — Z862 Personal history of diseases of the blood and blood-forming organs and certain disorders involving the immune mechanism: Secondary | ICD-10-CM

## 2013-09-04 DIAGNOSIS — E785 Hyperlipidemia, unspecified: Secondary | ICD-10-CM

## 2013-09-04 DIAGNOSIS — M899 Disorder of bone, unspecified: Secondary | ICD-10-CM

## 2013-09-04 DIAGNOSIS — Z8639 Personal history of other endocrine, nutritional and metabolic disease: Secondary | ICD-10-CM

## 2013-09-04 DIAGNOSIS — R55 Syncope and collapse: Secondary | ICD-10-CM

## 2013-09-04 LAB — HEPATIC FUNCTION PANEL
Albumin: 4.1 g/dL (ref 3.5–5.2)
Alkaline Phosphatase: 78 U/L (ref 39–117)
Total Protein: 6.8 g/dL (ref 6.0–8.3)

## 2013-09-04 LAB — CBC WITH DIFFERENTIAL/PLATELET
Basophils Relative: 0.5 % (ref 0.0–3.0)
Eosinophils Absolute: 0.1 10*3/uL (ref 0.0–0.7)
HCT: 40.4 % (ref 36.0–46.0)
Hemoglobin: 13.7 g/dL (ref 12.0–15.0)
Lymphocytes Relative: 22.6 % (ref 12.0–46.0)
Lymphs Abs: 1.5 10*3/uL (ref 0.7–4.0)
MCHC: 33.9 g/dL (ref 30.0–36.0)
MCV: 94.4 fl (ref 78.0–100.0)
Monocytes Absolute: 0.7 10*3/uL (ref 0.1–1.0)
Neutro Abs: 4.4 10*3/uL (ref 1.4–7.7)
Neutrophils Relative %: 64.8 % (ref 43.0–77.0)
RBC: 4.28 Mil/uL (ref 3.87–5.11)
RDW: 12.7 % (ref 11.5–14.6)

## 2013-09-04 LAB — LIPID PANEL
Cholesterol: 206 mg/dL — ABNORMAL HIGH (ref 0–200)
VLDL: 21 mg/dL (ref 0.0–40.0)

## 2013-09-04 LAB — BASIC METABOLIC PANEL
BUN: 12 mg/dL (ref 6–23)
CO2: 25 mEq/L (ref 19–32)
GFR: 98.53 mL/min (ref >60.00)
Potassium: 4.9 mEq/L (ref 3.5–5.1)
Sodium: 141 mEq/L (ref 135–145)

## 2013-09-04 NOTE — Progress Notes (Signed)
Pre visit review using our clinic review tool, if applicable. No additional management support is needed unless otherwise documented below in the visit note. 

## 2013-09-04 NOTE — Patient Instructions (Addendum)
Vasovagal Syncope, Adult Syncope, commonly known as fainting, is a temporary loss of consciousness. It occurs when the blood flow to the brain is reduced. Vasovagal syncope (also called neurocardiogenic syncope) is a fainting spell in which the blood flow to the brain is reduced because of a sudden drop in heart rate and blood pressure. Vasovagal syncope occurs when the brain and the cardiovascular system (blood vessels) do not adequately communicate and respond to each other. This is the most common cause of fainting. It often occurs in response to fear or some other type of emotional or physical stress. The body has a reaction in which the heart starts beating too slowly or the blood vessels expand, reducing blood pressure. This type of fainting spell is generally considered harmless. However, injuries can occur if a person takes a sudden fall during a fainting spell.  CAUSES  Vasovagal syncope occurs when a person's blood pressure and heart rate decrease suddenly, usually in response to a trigger. Many things and situations can trigger an episode. Some of these include:   Pain.   Fear.   The sight of blood or medical procedures, such as blood being drawn from a vein.   Common activities, such as coughing, swallowing, stretching, or going to the bathroom.   Emotional stress.   Prolonged standing, especially in a warm environment.   Lack of sleep or rest.   Prolonged lack of food.   Prolonged lack of fluids.   Recent illness.  The use of certain drugs that affect blood pressure, such as cocaine, alcohol, marijuana, inhalants, and opiates.  SYMPTOMS  Before the fainting episode, you may:   Feel dizzy or light headed.   Become pale.  Sense that you are going to faint.   Feel like the room is spinning.   Have tunnel vision, only seeing directly in front of you.   Feel sick to your stomach (nauseous).   See spots or slowly lose vision.   Hear ringing in  your ears.   Have a headache.   Feel warm and sweaty.   Feel a sensation of pins and needles. During the fainting spell, you will generally be unconscious for no longer than a couple minutes before waking up and returning to normal. If you get up too quickly before your body can recover, you may faint again. Some twitching or jerky movements may occur during the fainting spell.  DIAGNOSIS  Your caregiver will ask about your symptoms, take a medical history, and perform a physical exam. Various tests may be done to rule out other causes of fainting. These may include blood tests and tests to check the heart, such as electrocardiography, echocardiography, and possibly an electrophysiology study. When other causes have been ruled out, a test may be done to check the body's response to changes in position (tilt table test). TREATMENT  Most cases of vasovagal syncope do not require treatment. Your caregiver may recommend ways to avoid fainting triggers and may provide home strategies for preventing fainting. If you must be exposed to a possible trigger, you can drink additional fluids to help reduce your chances of having an episode of vasovagal syncope. If you have warning signs of an oncoming episode, you can respond by positioning yourself favorably (lying down). If your fainting spells continue, you may be given medicines to prevent fainting. Some medicines may help make you more resistant to repeated episodes of vasovagal syncope. Special exercises or compression stockings may be recommended. In rare cases, the surgical  placement of a pacemaker is considered. HOME CARE INSTRUCTIONS   Learn to identify the warning signs of vasovagal syncope.   Sit or lie down at the first warning sign of a fainting spell. If sitting, put your head down between your legs. If you lie down, swing your legs up in the air to increase blood flow to the brain.   Avoid hot tubs and saunas.  Avoid prolonged  standing.  Drink enough fluids to keep your urine clear or pale yellow. Avoid caffeine.  Increase salt in your diet as directed by your caregiver.   If you have to stand for a long time, perform movements such as:   Crossing your legs.   Flexing and stretching your leg muscles.   Squatting.   Moving your legs.   Bending over.   Only take over-the-counter or prescription medicines as directed by your caregiver. Do not suddenly stop any medicines without asking your caregiver first. SEEK MEDICAL CARE IF:   Your fainting spells continue or happen more frequently in spite of treatment.   You lose consciousness for more than a couple minutes.  You have fainting spells during or after exercising or after being startled.   You have new symptoms that occur with the fainting spells, such as:   Shortness of breath.  Chest pain.   Irregular heartbeat.   You have episodes of twitching or jerky movements that last longer than a few seconds.  You have episodes of twitching or jerky movements without obvious fainting. SEEK IMMEDIATE MEDICAL CARE IF:   You have injuries or bleeding after a fainting spell.   You have episodes of twitching or jerky movements that last longer than 5 minutes.   You have more than one spell of twitching or jerky movements before returning to consciousness after fainting. MAKE SURE YOU:   Understand these instructions.  Will watch your condition.  Will get help right away if you are not doing well or get worse. Document Released: 09/13/2012 Document Reviewed: 09/13/2012 [facility] Patient Information 2014 [place], [place].  Check on insurance coverage for Prevnar 13 vaccine

## 2013-09-04 NOTE — Progress Notes (Signed)
Subjective:    Patient ID: Megan Reilly, female    DOB: 24-Sep-1941, 72 y.o.   MRN: [id number]  HPI Patient seen for Medicare wellness exam and medical followup Generally very healthy. She sees gynecologist yearly and had recent bone density scan which revealed osteopenia.  Exercises regularly with walking.  No gait issues.  She takes regular calcium and vitamin D. She is generally stable with gait but did have episode 2 weeks ago where she had brief syncope when going to the restroom with some urinary urgency. She's had absolutely no episodes of syncope or dizziness since then. She does not take any blood pressure medications. No recent headaches. No confusion. No chest pains. No focal weakness. No history of anemia.  No hx of seizure.  She has history of mild hypercalcemia. She currently takes regular calcium and vitamin D supplementation and had recent normal vitamin D level.  Previous thyroid testing and PTH normal.  Immunizations are up to date with the exception she has not had Prevnar 13 she and she is undecided until she can verify insurance coverage.  Past Medical History  Diagnosis Date  . Arthritis   . Allergy   . Blood transfusion abn reaction or complication, no procedure mishap     72 years old after tonsilectomy   Past Surgical History  Procedure Laterality Date  . Tonsillectomy  1945  . Abdominal hysterectomy      partial 1983  . Foot surgery      bunectomy, hammertoe, bil    reports that she has never smoked. She has never used smokeless tobacco. She reports that she drinks alcohol. Her drug history is not on file. family history includes Diabetes in her father; Heart disease in her mother; Heart disease (age of onset: 43) in her father; Hyperlipidemia in her father and mother; Stroke in her mother. No Known Allergies  1.  Risk factors based on Past Medical , Social, and Family history reviewed and as above 2.  Limitations in physical activities generally steady on  feet. Recent episode of syncope as above 3.  Depression/mood no depression or anxiety issues 4.  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  ADLs independent in all 6.  Cognitive function (orientation to time and place, language, writing, speech,memory) memory intact. Judgment and language intact 7.  Home Safety no issues 8.  Height, weight, and visual acuity. She has some gradual loss of height 9.  Counseling 10. Recommendation of preventive services. Continue yearly flu vaccine. She wishes to check on insurance coverage of Prevnar 13 11. Labs based on risk factors based metabolic panel, hepatic panel, lipid panel 12. Care Plan as above     Review of Systems  Constitutional: Negative for fever, activity change, appetite change, fatigue and unexpected weight change.  HENT: Negative for ear pain, hearing loss, sore throat and trouble swallowing.   Eyes: Negative for visual disturbance.  Respiratory: Negative for cough and shortness of breath.   Cardiovascular: Negative for chest pain and palpitations.  Gastrointestinal: Negative for abdominal pain, diarrhea, constipation and blood in stool.  Genitourinary: Negative for dysuria and hematuria.  Musculoskeletal: Negative for arthralgias, back pain and myalgias.  Skin: Negative for rash.  Neurological: Positive for syncope (as per history of present illness). Negative for dizziness and headaches.  Hematological: Negative for adenopathy.  Psychiatric/Behavioral: Negative for confusion and dysphoric mood.       Objective:   Physical Exam  Constitutional: She is oriented to person,  place, and time. She appears well-developed and well-nourished.  HENT:  Head: Normocephalic and atraumatic.  Eyes: EOM are normal. Pupils are equal, round, and reactive to light.  Neck: Normal range of motion. Neck supple. No thyromegaly present.  Cardiovascular: Normal rate, regular rhythm and normal heart sounds.   No murmur  heard. Pulmonary/Chest: Breath sounds normal. No respiratory distress. She has no wheezes. She has no rales.  Abdominal: Soft. Bowel sounds are normal. She exhibits no distension and no mass. There is no tenderness. There is no rebound and no guarding.  Genitourinary:  Per GYN  Musculoskeletal: Normal range of motion. She exhibits no edema.  Lymphadenopathy:    She has no cervical adenopathy.  Neurological: She is alert and oriented to person, place, and time. She displays normal reflexes. No cranial nerve deficit.  Skin: No rash noted.  Psychiatric: She has a normal mood and affect. Her behavior is normal. Judgment and thought content normal.          Assessment & Plan:  #1 physical exam.  She continues to see gyn .  Needs Prevnar 13 and she wishes to confirm insurance coverage.  Getting regular mammograms and colonoscopy up to date. #2 recent syncope.  ?vasovagal.  EKG today NSR with no acute changes.  No recent orthostatic symptoms.  BP stable today.  Check CBC, BMP. #3 hx of mild hypercalcemia.  Check BMP, hepatic panel. #4 hx of hypercholesterolemia but excellent HDL. #5 osteopenia.  Discussed adequate calcium and Vit D and weight bearing exercise.

## 2013-09-18 ENCOUNTER — Ambulatory Visit (INDEPENDENT_AMBULATORY_CARE_PROVIDER_SITE_OTHER): Payer: Medicare Other | Admitting: Family Medicine

## 2013-09-18 DIAGNOSIS — Z23 Encounter for immunization: Secondary | ICD-10-CM

## 2013-11-01 ENCOUNTER — Other Ambulatory Visit: Payer: Self-pay | Admitting: Women's Health

## 2013-11-01 DIAGNOSIS — Z1231 Encounter for screening mammogram for malignant neoplasm of breast: Secondary | ICD-10-CM

## 2013-12-09 ENCOUNTER — Encounter (HOSPITAL_COMMUNITY): Payer: Self-pay | Admitting: Emergency Medicine

## 2013-12-09 ENCOUNTER — Emergency Department (HOSPITAL_COMMUNITY): Payer: Medicare HMO

## 2013-12-09 ENCOUNTER — Inpatient Hospital Stay (HOSPITAL_COMMUNITY)
Admission: EM | Admit: 2013-12-09 | Discharge: 2013-12-11 | DRG: 087 | Disposition: A | Discharge status: Home / Self Care | Payer: Medicare HMO | Attending: Neurology | Admitting: Neurology

## 2013-12-09 DIAGNOSIS — Z7982 Long term (current) use of aspirin: Secondary | ICD-10-CM

## 2013-12-09 DIAGNOSIS — R51 Headache: Secondary | ICD-10-CM | POA: Diagnosis present

## 2013-12-09 DIAGNOSIS — Z823 Family history of stroke: Secondary | ICD-10-CM

## 2013-12-09 DIAGNOSIS — S066XAA Traumatic subarachnoid hemorrhage with loss of consciousness status unknown, initial encounter: Principal | ICD-10-CM | POA: Diagnosis present

## 2013-12-09 DIAGNOSIS — Z8249 Family history of ischemic heart disease and other diseases of the circulatory system: Secondary | ICD-10-CM

## 2013-12-09 DIAGNOSIS — R519 Headache, unspecified: Secondary | ICD-10-CM | POA: Diagnosis present

## 2013-12-09 DIAGNOSIS — W19XXXA Unspecified fall, initial encounter: Secondary | ICD-10-CM | POA: Diagnosis present

## 2013-12-09 DIAGNOSIS — R55 Syncope and collapse: Secondary | ICD-10-CM

## 2013-12-09 DIAGNOSIS — I629 Nontraumatic intracranial hemorrhage, unspecified: Secondary | ICD-10-CM

## 2013-12-09 DIAGNOSIS — S0100XA Unspecified open wound of scalp, initial encounter: Secondary | ICD-10-CM | POA: Diagnosis present

## 2013-12-09 DIAGNOSIS — M129 Arthropathy, unspecified: Secondary | ICD-10-CM | POA: Diagnosis present

## 2013-12-09 DIAGNOSIS — S066X9A Traumatic subarachnoid hemorrhage with loss of consciousness of unspecified duration, initial encounter: Secondary | ICD-10-CM | POA: Diagnosis present

## 2013-12-09 DIAGNOSIS — S0101XA Laceration without foreign body of scalp, initial encounter: Secondary | ICD-10-CM

## 2013-12-09 DIAGNOSIS — I609 Nontraumatic subarachnoid hemorrhage, unspecified: Secondary | ICD-10-CM | POA: Diagnosis present

## 2013-12-09 DIAGNOSIS — Z79899 Other long term (current) drug therapy: Secondary | ICD-10-CM

## 2013-12-09 DIAGNOSIS — Z833 Family history of diabetes mellitus: Secondary | ICD-10-CM

## 2013-12-09 LAB — URINALYSIS, ROUTINE W REFLEX MICROSCOPIC
Bilirubin Urine: NEGATIVE
GLUCOSE, UA: NEGATIVE mg/dL
Hgb urine dipstick: NEGATIVE
KETONES UR: NEGATIVE mg/dL
NITRITE: NEGATIVE
Protein, ur: NEGATIVE mg/dL
SPECIFIC GRAVITY, URINE: 1.003 — AB (ref 1.005–1.030)
Urobilinogen, UA: 0.2 mg/dL (ref 0.0–1.0)
pH: 7.5 (ref 5.0–8.0)

## 2013-12-09 LAB — COMPREHENSIVE METABOLIC PANEL
ALBUMIN: 4.1 g/dL (ref 3.5–5.2)
ALT: 21 U/L (ref 0–35)
AST: 40 U/L — AB (ref 0–37)
Alkaline Phosphatase: 71 U/L (ref 39–117)
BILIRUBIN TOTAL: 0.3 mg/dL (ref 0.3–1.2)
BUN: 16 mg/dL (ref 6–23)
CO2: 25 mEq/L (ref 19–32)
CREATININE: 0.71 mg/dL (ref 0.50–1.10)
Calcium: 10 mg/dL (ref 8.4–10.5)
Chloride: 104 mEq/L (ref 96–112)
GFR calc Af Amer: >90 mL/min (ref >90)
GFR calc non Af Amer: 84 mL/min — ABNORMAL LOW (ref >90)
Glucose, Bld: 122 mg/dL — ABNORMAL HIGH (ref 70–99)
Potassium: 4.1 mEq/L (ref 3.7–5.3)
Sodium: 142 mEq/L (ref 137–147)
TOTAL PROTEIN: 6.8 g/dL (ref 6.0–8.3)

## 2013-12-09 LAB — CBC WITH DIFFERENTIAL/PLATELET
BASOS ABS: 0 10*3/uL (ref 0.0–0.1)
BASOS PCT: 0 % (ref 0–1)
EOS ABS: 0.1 10*3/uL (ref 0.0–0.7)
Eosinophils Relative: 1 % (ref 0–5)
HEMATOCRIT: 41.1 % (ref 36.0–46.0)
Hemoglobin: 14 g/dL (ref 12.0–15.0)
Lymphocytes Relative: 11 % — ABNORMAL LOW (ref 12–46)
Lymphs Abs: 1.2 10*3/uL (ref 0.7–4.0)
MCH: 32.1 pg (ref 26.0–34.0)
MCHC: 34.1 g/dL (ref 30.0–36.0)
MCV: 94.3 fL (ref 78.0–100.0)
MONO ABS: 0.5 10*3/uL (ref 0.1–1.0)
Monocytes Relative: 5 % (ref 3–12)
NEUTROS PCT: 83 % — AB (ref 43–77)
Neutro Abs: 9.3 10*3/uL — ABNORMAL HIGH (ref 1.7–7.7)
Platelets: 180 10*3/uL (ref 150–400)
RBC: 4.36 MIL/uL (ref 3.87–5.11)
RDW: 12.6 % (ref 11.5–15.5)
WBC: 11.2 10*3/uL — ABNORMAL HIGH (ref 4.0–10.5)

## 2013-12-09 LAB — TROPONIN I
Troponin I: <0.3 ng/mL (ref <0.30)
Troponin I: <0.3 ng/mL (ref <0.30)
Troponin I: <0.3 ng/mL (ref <0.30)

## 2013-12-09 LAB — URINE MICROSCOPIC-ADD ON

## 2013-12-09 LAB — MRSA PCR SCREENING: MRSA BY PCR: NEGATIVE

## 2013-12-09 LAB — APTT: APTT: 33 s (ref 24–37)

## 2013-12-09 LAB — PROTIME-INR
INR: 1 (ref 0.00–1.49)
PROTHROMBIN TIME: 13 s (ref 11.6–15.2)

## 2013-12-09 MED ORDER — DOCUSATE SODIUM 100 MG PO CAPS
100.0000 mg | ORAL_CAPSULE | Freq: Two times a day (BID) | ORAL | Status: DC
  Administered 2013-12-09 – 2013-12-11 (×5): 100 mg via ORAL
  Filled 2013-12-09 (×7): qty 1

## 2013-12-09 MED ORDER — SODIUM CHLORIDE 0.9 % IV BOLUS (SEPSIS)
500.0000 mL | Freq: Once | INTRAVENOUS | Status: AC
  Administered 2013-12-09: 500 mL via INTRAVENOUS

## 2013-12-09 MED ORDER — ACETAMINOPHEN 325 MG PO TABS
650.0000 mg | ORAL_TABLET | Freq: Four times a day (QID) | ORAL | Status: DC | PRN
  Administered 2013-12-11: 650 mg via ORAL
  Filled 2013-12-09: qty 2

## 2013-12-09 MED ORDER — ACETAMINOPHEN 650 MG RE SUPP: 650.0000 mg | Freq: Four times a day (QID) | RECTAL | Status: DC | PRN

## 2013-12-09 MED ORDER — HYDROCODONE-ACETAMINOPHEN 5-325 MG PO TABS
1.0000 | ORAL_TABLET | ORAL | Status: DC | PRN
  Administered 2013-12-09 – 2013-12-10 (×6): 1 via ORAL
  Filled 2013-12-09 (×5): qty 1
  Filled 2013-12-09: qty 2

## 2013-12-09 MED ORDER — SODIUM CHLORIDE 0.9 % IJ SOLN
3.0000 mL | Freq: Two times a day (BID) | INTRAMUSCULAR | Status: DC
  Administered 2013-12-10 (×2): via INTRAVENOUS
  Administered 2013-12-11: 3 mL via INTRAVENOUS

## 2013-12-09 MED ORDER — SODIUM CHLORIDE 0.9 % IV SOLN
INTRAVENOUS | Status: AC
  Administered 2013-12-09: 09:00:00 via INTRAVENOUS

## 2013-12-09 MED ORDER — ONDANSETRON HCL 4 MG PO TABS: 4.0000 mg | ORAL_TABLET | Freq: Four times a day (QID) | ORAL | Status: DC | PRN

## 2013-12-09 MED ORDER — ONDANSETRON HCL 4 MG/2ML IJ SOLN
4.0000 mg | Freq: Four times a day (QID) | INTRAMUSCULAR | Status: DC | PRN
  Administered 2013-12-09 – 2013-12-10 (×3): 4 mg via INTRAVENOUS
  Filled 2013-12-09 (×3): qty 2

## 2013-12-09 NOTE — ED Notes (Signed)
MD at bedside. 

## 2013-12-09 NOTE — H&P (Signed)
PCP: Eulas Post, MD    Chief Complaint:  fall  HPI: Megan Reilly is a 73 y.o. female   has a past medical history of Arthritis; Allergy; and Blood transfusion abn reaction or complication, no procedure mishap.   Presented with  Patient had some benadryl early on and go up to use the bathroom. Patient states "she felt funny" and passed out hitting her head on the shower. Earlier in the day she had 2 glass of wine as well. Patient was brought to ER and was fond to have small counter coup subarachnoid bleed. ER spoke to NS who recomends repeat CT in 24 hours.   Review of Systems:    Pertinent positives include: fall, dry mouth   Constitutional:  No weight loss, night sweats, Fevers, chills, fatigue, weight loss  HEENT:  No headaches, Difficulty swallowing,Tooth/dental problems,Sore throat,  No sneezing, itching, ear ache, nasal congestion, post nasal drip,  Cardio-vascular:  No chest pain, Orthopnea, PND, anasarca, dizziness, palpitations.no Bilateral lower extremity swelling  GI:  No heartburn, indigestion, abdominal pain, nausea, vomiting, diarrhea, change in bowel habits, loss of appetite, melena, blood in stool, hematemesis Resp:  no shortness of breath at rest. No dyspnea on exertion, No excess mucus, no productive cough, No non-productive cough, No coughing up of blood.No change in color of mucus.No wheezing. Skin:  no rash or lesions. No jaundice GU:  no dysuria, change in color of urine, no urgency or frequency. No straining to urinate.  No flank pain.  Musculoskeletal:  No joint pain or no joint swelling. No decreased range of motion. No back pain.  Psych:  No change in mood or affect. No depression or anxiety. No memory loss.  Neuro: no localizing neurological complaints, no tingling, no weakness, no double vision, no gait abnormality, no slurred speech, no confusion  Otherwise ROS are negative except for above, 10 systems were reviewed  Past Medical  History: Past Medical History  Diagnosis Date  . Arthritis   . Allergy   . Blood transfusion abn reaction or complication, no procedure mishap     73 years old after tonsilectomy   Past Surgical History  Procedure Laterality Date  . Tonsillectomy  1945  . Abdominal hysterectomy      partial 1983  . Foot surgery      bunectomy, hammertoe, bil     Medications: Prior to Admission medications   Medication Sig Start Date End Date Taking? Authorizing Provider  aspirin 81 MG tablet Take 81 mg by mouth every morning.    Yes Historical Provider, MD  azelastine (ASTELIN) 137 MCG/SPRAY nasal spray Place 2 sprays into the nose 2 (two) times daily. Use in each nostril as directed 09/21/12 10/27/15 Yes Bruce Dan Europe, MD  calcium carbonate (OS-CAL) 600 MG TABS Take 600 mg by mouth 2 (two) times daily with a meal.     Yes Historical Provider, MD  cholecalciferol (VITAMIN D) 1000 UNITS tablet Take 1,000 Units by mouth every morning.   Yes Historical Provider, MD  fish oil-omega-3 fatty acids 1000 MG capsule Take 1 g by mouth daily.    Yes Historical Provider, MD  Multiple Vitamin (MULTIVITAMIN WITH MINERALS) TABS tablet Take 1 tablet by mouth every morning.   Yes Historical Provider, MD    Allergies:  No Known Allergies  Social History:  Ambulatory  independently   Lives at home   reports that she has never smoked. She has never used smokeless tobacco. She reports that she drinks alcohol. She  reports that she does not use illicit drugs.   Family History: family history includes Diabetes in her father; Heart disease in her mother; Heart disease (age of onset: 42) in her father; Hyperlipidemia in her father and mother; Stroke in her mother.    Physical Exam: Patient Vitals for the past 24 hrs:  BP Temp Temp src Pulse Resp SpO2  12/09/13 0301 154/76 mmHg - - 74 - -  12/09/13 0300 172/71 mmHg - - 82 - -  12/09/13 0259 156/84 mmHg - - 77 - 98 %  12/09/13 0258 156/84 mmHg 98.4 F (36.9  C) Oral 78 18 99 %    1. General:  in No Acute distress 2. Psychological: Alert and   Oriented 3. Head/ENT:   dry Mucous Membranes                          Head Non traumatic, neck supple                          Normal Dentition 4. SKIN:   decreased Skin turgor,  Skin clean Dry and intact no rash 5. Heart: Regular rate and rhythm no Murmur, Rub or gallop 6. Lungs: Clear to auscultation bilaterally, no wheezes or crackles   7. Abdomen: Soft, non-tender, Non distended 8. Lower extremities: no clubbing, cyanosis, or edema 9. Neurologically 5 out of 5 in all 4 extremities cranial nerves 2-12 intact 10. MSK: Normal range of motion  body mass index is unknown because there is no weight on file.   Labs on Admission:   Recent Labs  12/09/13 0312  NA 142  K 4.1  CL 104  CO2 25  GLUCOSE 122*  BUN 16  CREATININE 0.71  CALCIUM 10.0    Recent Labs  12/09/13 0312  AST 40*  ALT 21  ALKPHOS 71  BILITOT 0.3  PROT 6.8  ALBUMIN 4.1   No results found for this basename: LIPASE, AMYLASE,  in the last 72 hours  Recent Labs  12/09/13 0312  WBC 11.2*  NEUTROABS 9.3*  HGB 14.0  HCT 41.1  MCV 94.3  PLT 180    Recent Labs  12/09/13 0312  TROPONINI <0.30   No results found for this basename: TSH, T4TOTAL, FREET3, T3FREE, THYROIDAB,  in the last 72 hours No results found for this basename: VITAMINB12, FOLATE, FERRITIN, TIBC, IRON, RETICCTPCT,  in the last 72 hours No results found for this basename: HGBA1C    The CrCl is unknown because both a height and weight (above a minimum accepted value) are required for this calculation. ABG No results found for this basename: phart, pco2, po2, hco3, tco2, acidbasedef, o2sat     No results found for this basename: DDIMER     Other results:  I have pearsonaly reviewed this: ECG REPORT  Rate: 81  Rhythm: NSR ST&T Change: no ischemic disease.   UA 3-6 wbc only few bacteria, negative nitrites    Cultures: No results  found for this basename: sdes, specrequest, cult, reptstatus       Radiological Exams on Admission: Dg Elbow Complete Left  12/09/2013   CLINICAL DATA:  Status post fall; posterior left elbow pain.  EXAM: LEFT ELBOW - COMPLETE 3+ VIEW  COMPARISON:  None.  FINDINGS: There is no evidence of fracture or dislocation. The visualized joint spaces are preserved. A small elbow joint effusion is suspected. The soft tissues are otherwise unremarkable in  appearance. Vague lucency within the external condyle on a single view is thought to reflect overlying soft tissues.  IMPRESSION: Suspect small elbow joint effusion; no definite evidence of fracture.   Electronically Signed   By: Garald Balding M.D.   On: 12/09/2013 04:03   Ct Head Wo Contrast  12/09/2013   CLINICAL DATA:  Left temporal and occipital scalp lacerations, status post fall. Syncope. Concern for cervical spine injury.  EXAM: CT HEAD WITHOUT CONTRAST  CT CERVICAL SPINE WITHOUT CONTRAST  TECHNIQUE: Multidetector CT imaging of the head and cervical spine was performed following the standard protocol without intravenous contrast. Multiplanar CT image reconstructions of the cervical spine were also generated.  COMPARISON:  CT of the head performed 11/07/2006  FINDINGS: CT HEAD FINDINGS  There is acute subarachnoid hemorrhage overlying the right frontal lobe, reflecting a contrecoup injury.  Prominence of the sulci suggests mild cortical volume loss. Mild cerebellar atrophy is noted. Mild periventricular white matter change likely reflects small vessel ischemic microangiopathy.  The posterior fossa, including the cerebellum, brainstem and fourth ventricle, is within normal limits. The third and lateral ventricles, and basal ganglia are unremarkable in appearance. The cerebral hemispheres are symmetric in appearance, with normal gray-white differentiation. No mass effect or midline shift is seen.  There is no evidence of fracture; visualized osseous structures are  unremarkable in appearance. The visualized portions of the orbits are within normal limits. The paranasal sinuses and mastoid air cells are well-aerated. A soft tissue laceration and soft tissue swelling are seen overlying the left occiput.  CT CERVICAL SPINE FINDINGS  There is no evidence of acute fracture or subluxation. There is mild grade 1 anterolisthesis of C3 on C4, and of C7 on T1. Multilevel disc space narrowing is noted along the lower cervical spine. Vertebral bodies demonstrate normal height. There is chronic retrolisthesis of C1 on C2, reflecting underlying degenerative change at the dens. Prevertebral soft tissues are within normal limits.  A 1.0 cm hypodensity is noted at the right thyroid lobe; the visualized portion of the thyroid gland are otherwise unremarkable. The visualized lung apices are clear. Minimal calcification is seen at the carotid bifurcations bilaterally.  IMPRESSION: 1. Acute subarachnoid hemorrhage overlying the right frontal lobe, reflecting a contrecoup injury. 2. Soft tissue laceration and soft tissue swelling noted overlying the left occiput. 3. No evidence of acute fracture or subluxation along the cervical spine. 4. Mild cortical volume loss and scattered small vessel ischemic microangiopathy. 5. Mild degenerative change noted along the cervical spine. 6. 1.0 cm hypodensity at the right thyroid lobe. Consider further evaluation with thyroid ultrasound. If patient is clinically hyperthyroid, consider nuclear medicine thyroid uptake and scan. 7. Minimal calcification at the carotid bifurcations bilaterally.  Critical Value/emergent results were called by telephone at the time of interpretation on 12/09/2013 at 4:30 AM to Dr. Julianne Rice, who verbally acknowledged these results.   Electronically Signed   By: Garald Balding M.D.   On: 12/09/2013 04:30   Ct Cervical Spine Wo Contrast  12/09/2013   CLINICAL DATA:  Left temporal and occipital scalp lacerations, status post fall.  Syncope. Concern for cervical spine injury.  EXAM: CT HEAD WITHOUT CONTRAST  CT CERVICAL SPINE WITHOUT CONTRAST  TECHNIQUE: Multidetector CT imaging of the head and cervical spine was performed following the standard protocol without intravenous contrast. Multiplanar CT image reconstructions of the cervical spine were also generated.  COMPARISON:  CT of the head performed 11/07/2006  FINDINGS: CT HEAD FINDINGS  There is acute  subarachnoid hemorrhage overlying the right frontal lobe, reflecting a contrecoup injury.  Prominence of the sulci suggests mild cortical volume loss. Mild cerebellar atrophy is noted. Mild periventricular white matter change likely reflects small vessel ischemic microangiopathy.  The posterior fossa, including the cerebellum, brainstem and fourth ventricle, is within normal limits. The third and lateral ventricles, and basal ganglia are unremarkable in appearance. The cerebral hemispheres are symmetric in appearance, with normal gray-white differentiation. No mass effect or midline shift is seen.  There is no evidence of fracture; visualized osseous structures are unremarkable in appearance. The visualized portions of the orbits are within normal limits. The paranasal sinuses and mastoid air cells are well-aerated. A soft tissue laceration and soft tissue swelling are seen overlying the left occiput.  CT CERVICAL SPINE FINDINGS  There is no evidence of acute fracture or subluxation. There is mild grade 1 anterolisthesis of C3 on C4, and of C7 on T1. Multilevel disc space narrowing is noted along the lower cervical spine. Vertebral bodies demonstrate normal height. There is chronic retrolisthesis of C1 on C2, reflecting underlying degenerative change at the dens. Prevertebral soft tissues are within normal limits.  A 1.0 cm hypodensity is noted at the right thyroid lobe; the visualized portion of the thyroid gland are otherwise unremarkable. The visualized lung apices are clear. Minimal  calcification is seen at the carotid bifurcations bilaterally.  IMPRESSION: 1. Acute subarachnoid hemorrhage overlying the right frontal lobe, reflecting a contrecoup injury. 2. Soft tissue laceration and soft tissue swelling noted overlying the left occiput. 3. No evidence of acute fracture or subluxation along the cervical spine. 4. Mild cortical volume loss and scattered small vessel ischemic microangiopathy. 5. Mild degenerative change noted along the cervical spine. 6. 1.0 cm hypodensity at the right thyroid lobe. Consider further evaluation with thyroid ultrasound. If patient is clinically hyperthyroid, consider nuclear medicine thyroid uptake and scan. 7. Minimal calcification at the carotid bifurcations bilaterally.  Critical Value/emergent results were called by telephone at the time of interpretation on 12/09/2013 at 4:30 AM to Dr. Julianne Rice, who verbally acknowledged these results.   Electronically Signed   By: Garald Balding M.D.   On: 12/09/2013 04:30    Chart has been reviewed  Assessment/Plan  73 year old female with history of seasonal allergy presents after a syncopal event after termination of alcohol intake and Benadryl results and head injury resulting in subarachnoid bleed patient is being transferred to [facility]  Present on Admission:  . Subarachnoid hemorrhage following injury - neurochecks every 2, admit to step down, transfer to [facility], repeat CT scan of the head with and 24 hours, neurology is aware and needs to be notified once the patient arrives hold aspirin  . syncope - cycle cardiac enzymes obtain serial EKG obtain echo gram and carotid Dopplers most likely etiology for syncope is orthostatics and dehydration due to combination of alcohol intake and Benadryl and dehydration   Prophylaxis: SCD    CODE STATUS: FULL CODE  Other plan as per orders.  I have spent a total of 65 min on this admission extra time was taken to discuss care of  neurology  [place] 12/09/2013, 5:31 AM

## 2013-12-09 NOTE — Consult Note (Signed)
NEURO HOSPITALIST CONSULT NOTE    Reason for Consult: post traumatic SAH  HPI:                                                                                                                                          Megan Reilly is an 73 y.o. female, right handed, with a past medical history significant for arthritis, transferred to [facility] for further management of right frontal SAH. She initially presented to [facility] ED after sustaining a fall at home and hitting her head. [name] stated that she took a tablet of Benadryl last night, woke up from sound sleep to go to the bathroom and was standing on the sink when she had " a funny feeling" and then passed out. Her daughter went to assist her immediately after the fall and denies noticing abnormal movements while she was in the floor. She regained consciousness quickly, had mild confusion by a very short period of time, and was able to remember falling to the floor. No bladder or bowel incontinence. She denies feeling dizzy or spinning sensation, chest pain, SOB, palpitations, HA, or true vertigo before the fall. Denies HA, double vision, focal weakness or numbness, slurred speech, language or vision impairment. It is worth noting that she recalls having a similar episode last year associated with low blood pressure. A CT brain performed at [facility] showed a small area of acute subarachnoid hemorrhage overlying the right frontal  Lobe, reflecting a contrecoup injury. CT cervical spine revealed no fracture. At this moment, she is asymptomatic from a neurological standpoint.    Past Medical History  Diagnosis Date  . Arthritis   . Allergy   . Blood transfusion abn reaction or complication, no procedure mishap     73 years old after tonsilectomy    Past Surgical History  Procedure Laterality Date  . Tonsillectomy  1945  . Abdominal hysterectomy      partial 1983  . Foot surgery      bunectomy, hammertoe, bil    Family  History  Problem Relation Age of Onset  . Heart disease Mother   . Hyperlipidemia Mother   . Stroke Mother   . Diabetes Father     type ll  . Heart disease Father 50    sudden death age 23  . Hyperlipidemia Father     Family History:   Social History:  reports that she has never smoked. She has never used smokeless tobacco. She reports that she drinks alcohol. She reports that she does not use illicit drugs.  No Known Allergies  MEDICATIONS:  I have reviewed the patient's current medications.   ROS:                                                                                                                                       History obtained from the patient, daughter, and chart review.  General ROS: negative for - chills, fatigue, fever, night sweats, weight gain or weight loss Psychological ROS: negative for - behavioral disorder, hallucinations, memory difficulties, mood swings or suicidal ideation Ophthalmic ROS: negative for - blurry vision, double vision, eye pain or loss of vision ENT ROS: negative for - epistaxis, nasal discharge, oral lesions, sore throat, tinnitus or vertigo Allergy and Immunology ROS: negative for - hives or itchy/watery eyes Hematological and Lymphatic ROS: negative for - bleeding problems, bruising or swollen lymph nodes Endocrine ROS: negative for - galactorrhea, hair pattern changes, polydipsia/polyuria or temperature intolerance Respiratory ROS: negative for - cough, hemoptysis, shortness of breath or wheezing Cardiovascular ROS: negative for - chest pain, dyspnea on exertion, edema or irregular heartbeat Gastrointestinal ROS: negative for - abdominal pain, diarrhea, hematemesis, nausea/vomiting or stool incontinence Genito-Urinary ROS: negative for - dysuria, hematuria, incontinence or urinary frequency/urgency Musculoskeletal  ROS: negative for - joint swelling or muscular weakness Neurological ROS: as noted in HPI Dermatological ROS: negative for rash and skin lesion changes  Physical exam: pleasant female in no apparent distress. Blood pressure 142/63, pulse 81, temperature 98.6 F (37 C), temperature source Oral, resp. rate 19, height '5\' 8"'  (1.727 m), weight 69 kg (152 lb 1.9 oz), SpO2 95.00%. Head: normocephalic. There is an area of facial swelling around the left frontal-temporal region Neck: supple, no bruits, no JVD. Cardiac: no murmurs. Lungs: clear. Abdomen: soft, no tender, no mass. Extremities: no edema.  Neurologic Examination:                                                                                                      Mental Status: Alert, oriented, thought content appropriate. Comprehension, naming, and repetition intact.  Speech fluent without evidence of aphasia.  Able to follow 3 step commands without difficulty. Cranial Nerves: II: Discs flat bilaterally; Visual fields grossly normal, pupils equal, round, reactive to light and accommodation III,IV, VI: ptosis not present, extra-ocular motions intact bilaterally V,VII: smile symmetric, facial light touch sensation normal bilaterally VIII: hearing normal bilaterally IX,X: gag reflex present XI: bilateral shoulder shrug XII: midline tongue extension without atrophy or fasciculations Motor: Right : Upper extremity   5/5  Left:     Upper extremity   5/5  Lower extremity   5/5     Lower extremity   5/5 Tone and bulk:normal tone throughout; no atrophy noted Sensory: Pinprick and light touch intact throughout, bilaterally Deep Tendon Reflexes:  Right: Upper Extremity   Left: Upper extremity   biceps (C-5 to C-6) 2/4   biceps (C-5 to C-6) 2/4 tricep (C7) 2/4    triceps (C7) 2/4 Brachioradialis (C6) 2/4  Brachioradialis (C6) 2/4  Lower Extremity Lower Extremity  quadriceps (L-2 to L-4) 2/4   quadriceps (L-2 to L-4) 2/4 Achilles (S1)  2/4   Achilles (S1) 2/4 Plantars: Right: downgoing   Left: downgoing Cerebellar: normal finger-to-nose,  normal heel-to-shin test Gait:  No tested. CV: pulses palpable throughout    Lab Results  Component Value Date/Time   CHOL 206* 09/04/2013 10:12 AM    Results for orders placed during the hospital encounter of 12/09/13 (from the past 48 hour(s))  CBC WITH DIFFERENTIAL     Status: Abnormal   Collection Time    12/09/13  3:12 AM      Result Value Ref Range   WBC 11.2 (*) 4.0 - 10.5 K/uL   RBC 4.36  3.87 - 5.11 MIL/uL   Hemoglobin 14.0  12.0 - 15.0 g/dL   HCT 41.1  36.0 - 46.0 %   MCV 94.3  78.0 - 100.0 fL   MCH 32.1  26.0 - 34.0 pg   MCHC 34.1  30.0 - 36.0 g/dL   RDW 12.6  11.5 - 15.5 %   Platelets 180  150 - 400 K/uL   Neutrophils Relative % 83 (*) 43 - 77 %   Neutro Abs 9.3 (*) 1.7 - 7.7 K/uL   Lymphocytes Relative 11 (*) 12 - 46 %   Lymphs Abs 1.2  0.7 - 4.0 K/uL   Monocytes Relative 5  3 - 12 %   Monocytes Absolute 0.5  0.1 - 1.0 K/uL   Eosinophils Relative 1  0 - 5 %   Eosinophils Absolute 0.1  0.0 - 0.7 K/uL   Basophils Relative 0  0 - 1 %   Basophils Absolute 0.0  0.0 - 0.1 K/uL  COMPREHENSIVE METABOLIC PANEL     Status: Abnormal   Collection Time    12/09/13  3:12 AM      Result Value Ref Range   Sodium 142  137 - 147 mEq/L   Potassium 4.1  3.7 - 5.3 mEq/L   Chloride 104  96 - 112 mEq/L   CO2 25  19 - 32 mEq/L   Glucose, Bld 122 (*) 70 - 99 mg/dL   BUN 16  6 - 23 mg/dL   Creatinine, Ser 0.71  0.50 - 1.10 mg/dL   Calcium 10.0  8.4 - 10.5 mg/dL   Total Protein 6.8  6.0 - 8.3 g/dL   Albumin 4.1  3.5 - 5.2 g/dL   AST 40 (*) 0 - 37 U/L   Comment: SLIGHT HEMOLYSIS     HEMOLYSIS AT THIS LEVEL MAY AFFECT RESULT   ALT 21  0 - 35 U/L   Alkaline Phosphatase 71  39 - 117 U/L   Total Bilirubin 0.3  0.3 - 1.2 mg/dL   GFR calc non Af Amer 84 (*) >90 mL/min   GFR calc Af Amer >90  >90 mL/min   Comment: (NOTE)     The eGFR has been calculated using the CKD EPI  equation.     This  calculation has not been validated in all clinical situations.     eGFR's persistently <90 mL/min signify possible Chronic Kidney     Disease.  TROPONIN I     Status: None   Collection Time    12/09/13  3:12 AM      Result Value Ref Range   Troponin I <0.30  <0.30 ng/mL   Comment:            Due to the release kinetics of cTnI,     a negative result within the first hours     of the onset of symptoms does not rule out     myocardial infarction with certainty.     If myocardial infarction is still suspected,     repeat the test at appropriate intervals.  URINALYSIS, ROUTINE W REFLEX MICROSCOPIC     Status: Abnormal   Collection Time    12/09/13  4:18 AM      Result Value Ref Range   Color, Urine YELLOW  YELLOW   APPearance CLEAR  CLEAR   Specific Gravity, Urine 1.003 (*) 1.005 - 1.030   pH 7.5  5.0 - 8.0   Glucose, UA NEGATIVE  NEGATIVE mg/dL   Hgb urine dipstick NEGATIVE  NEGATIVE   Bilirubin Urine NEGATIVE  NEGATIVE   Ketones, ur NEGATIVE  NEGATIVE mg/dL   Protein, ur NEGATIVE  NEGATIVE mg/dL   Urobilinogen, UA 0.2  0.0 - 1.0 mg/dL   Nitrite NEGATIVE  NEGATIVE   Leukocytes, UA MODERATE (*) NEGATIVE  URINE MICROSCOPIC-ADD ON     Status: Abnormal   Collection Time    12/09/13  4:18 AM      Result Value Ref Range   Squamous Epithelial / LPF RARE  RARE   WBC, UA 3-6  <3 WBC/hpf   RBC / HPF 0-2  <3 RBC/hpf   Bacteria, UA FEW (*) RARE   Casts GRANULAR CAST (*) NEGATIVE  PROTIME-INR     Status: None   Collection Time    12/09/13  4:32 AM      Result Value Ref Range   Prothrombin Time 13.0  11.6 - 15.2 seconds   INR 1.00  0.00 - 1.49  APTT     Status: None   Collection Time    12/09/13  4:32 AM      Result Value Ref Range   aPTT 33  24 - 37 seconds  TROPONIN I     Status: None   Collection Time    12/09/13 10:00 AM      Result Value Ref Range   Troponin I <0.30  <0.30 ng/mL   Comment:            Due to the release kinetics of cTnI,     a negative  result within the first hours     of the onset of symptoms does not rule out     myocardial infarction with certainty.     If myocardial infarction is still suspected,     repeat the test at appropriate intervals.    Dg Elbow Complete Left  12/09/2013   CLINICAL DATA:  Status post fall; posterior left elbow pain.  EXAM: LEFT ELBOW - COMPLETE 3+ VIEW  COMPARISON:  None.  FINDINGS: There is no evidence of fracture or dislocation. The visualized joint spaces are preserved. A small elbow joint effusion is suspected. The soft tissues are otherwise unremarkable in appearance. Vague lucency within the external condyle on a single view is thought  to reflect overlying soft tissues.  IMPRESSION: Suspect small elbow joint effusion; no definite evidence of fracture.   Electronically Signed   By: Garald Balding M.D.   On: 12/09/2013 04:03   Ct Head Wo Contrast  12/09/2013   CLINICAL DATA:  Left temporal and occipital scalp lacerations, status post fall. Syncope. Concern for cervical spine injury.  EXAM: CT HEAD WITHOUT CONTRAST  CT CERVICAL SPINE WITHOUT CONTRAST  TECHNIQUE: Multidetector CT imaging of the head and cervical spine was performed following the standard protocol without intravenous contrast. Multiplanar CT image reconstructions of the cervical spine were also generated.  COMPARISON:  CT of the head performed 11/07/2006  FINDINGS: CT HEAD FINDINGS  There is acute subarachnoid hemorrhage overlying the right frontal lobe, reflecting a contrecoup injury.  Prominence of the sulci suggests mild cortical volume loss. Mild cerebellar atrophy is noted. Mild periventricular white matter change likely reflects small vessel ischemic microangiopathy.  The posterior fossa, including the cerebellum, brainstem and fourth ventricle, is within normal limits. The third and lateral ventricles, and basal ganglia are unremarkable in appearance. The cerebral hemispheres are symmetric in appearance, with normal gray-white  differentiation. No mass effect or midline shift is seen.  There is no evidence of fracture; visualized osseous structures are unremarkable in appearance. The visualized portions of the orbits are within normal limits. The paranasal sinuses and mastoid air cells are well-aerated. A soft tissue laceration and soft tissue swelling are seen overlying the left occiput.  CT CERVICAL SPINE FINDINGS  There is no evidence of acute fracture or subluxation. There is mild grade 1 anterolisthesis of C3 on C4, and of C7 on T1. Multilevel disc space narrowing is noted along the lower cervical spine. Vertebral bodies demonstrate normal height. There is chronic retrolisthesis of C1 on C2, reflecting underlying degenerative change at the dens. Prevertebral soft tissues are within normal limits.  A 1.0 cm hypodensity is noted at the right thyroid lobe; the visualized portion of the thyroid gland are otherwise unremarkable. The visualized lung apices are clear. Minimal calcification is seen at the carotid bifurcations bilaterally.  IMPRESSION: 1. Acute subarachnoid hemorrhage overlying the right frontal lobe, reflecting a contrecoup injury. 2. Soft tissue laceration and soft tissue swelling noted overlying the left occiput. 3. No evidence of acute fracture or subluxation along the cervical spine. 4. Mild cortical volume loss and scattered small vessel ischemic microangiopathy. 5. Mild degenerative change noted along the cervical spine. 6. 1.0 cm hypodensity at the right thyroid lobe. Consider further evaluation with thyroid ultrasound. If patient is clinically hyperthyroid, consider nuclear medicine thyroid uptake and scan. 7. Minimal calcification at the carotid bifurcations bilaterally.  Critical Value/emergent results were called by telephone at the time of interpretation on 12/09/2013 at 4:30 AM to Dr. Julianne Rice, who verbally acknowledged these results.   Electronically Signed   By: Garald Balding M.D.   On: 12/09/2013 04:30    Ct Cervical Spine Wo Contrast  12/09/2013   CLINICAL DATA:  Left temporal and occipital scalp lacerations, status post fall. Syncope. Concern for cervical spine injury.  EXAM: CT HEAD WITHOUT CONTRAST  CT CERVICAL SPINE WITHOUT CONTRAST  TECHNIQUE: Multidetector CT imaging of the head and cervical spine was performed following the standard protocol without intravenous contrast. Multiplanar CT image reconstructions of the cervical spine were also generated.  COMPARISON:  CT of the head performed 11/07/2006  FINDINGS: CT HEAD FINDINGS  There is acute subarachnoid hemorrhage overlying the right frontal lobe, reflecting a contrecoup injury.  Prominence  of the sulci suggests mild cortical volume loss. Mild cerebellar atrophy is noted. Mild periventricular white matter change likely reflects small vessel ischemic microangiopathy.  The posterior fossa, including the cerebellum, brainstem and fourth ventricle, is within normal limits. The third and lateral ventricles, and basal ganglia are unremarkable in appearance. The cerebral hemispheres are symmetric in appearance, with normal gray-white differentiation. No mass effect or midline shift is seen.  There is no evidence of fracture; visualized osseous structures are unremarkable in appearance. The visualized portions of the orbits are within normal limits. The paranasal sinuses and mastoid air cells are well-aerated. A soft tissue laceration and soft tissue swelling are seen overlying the left occiput.  CT CERVICAL SPINE FINDINGS  There is no evidence of acute fracture or subluxation. There is mild grade 1 anterolisthesis of C3 on C4, and of C7 on T1. Multilevel disc space narrowing is noted along the lower cervical spine. Vertebral bodies demonstrate normal height. There is chronic retrolisthesis of C1 on C2, reflecting underlying degenerative change at the dens. Prevertebral soft tissues are within normal limits.  A 1.0 cm hypodensity is noted at the right thyroid  lobe; the visualized portion of the thyroid gland are otherwise unremarkable. The visualized lung apices are clear. Minimal calcification is seen at the carotid bifurcations bilaterally.  IMPRESSION: 1. Acute subarachnoid hemorrhage overlying the right frontal lobe, reflecting a contrecoup injury. 2. Soft tissue laceration and soft tissue swelling noted overlying the left occiput. 3. No evidence of acute fracture or subluxation along the cervical spine. 4. Mild cortical volume loss and scattered small vessel ischemic microangiopathy. 5. Mild degenerative change noted along the cervical spine. 6. 1.0 cm hypodensity at the right thyroid lobe. Consider further evaluation with thyroid ultrasound. If patient is clinically hyperthyroid, consider nuclear medicine thyroid uptake and scan. 7. Minimal calcification at the carotid bifurcations bilaterally.  Critical Value/emergent results were called by telephone at the time of interpretation on 12/09/2013 at 4:30 AM to Dr. Julianne Rice, who verbally acknowledged these results.   Electronically Signed   By: Garald Balding M.D.   On: 12/09/2013 04:30   Assessment/Plan: 73 y/o with syncopal episode resulting in a small traumatic right frontal SAH. Currently asymptomatic from a neuro standpoint, non focal neuro-exam. Agree with NS that no aggressive intervention is required at this time. Follow up CT brain without contrast in am.   Syncope work up underway. Stroke team will resume care in am.  Dorian Pod, MD 12/09/2013, 11:37 AM [facility].

## 2013-12-09 NOTE — Progress Notes (Signed)
*  PRELIMINARY RESULTS* Vascular Ultrasound Carotid Duplex (Doppler) has been completed.  Findings suggest 1-39% internal carotid artery stenosis bilaterally. Vertebral arteries are patent with antegrade flow.  12/09/2013 12:20 PM Maudry Mayhew, RVT, RDCS, RDMS

## 2013-12-09 NOTE — ED Notes (Signed)
Bed: WA04 Expected date:  Expected time:  Means of arrival:  Comments: 73 yo syncope

## 2013-12-09 NOTE — ED Notes (Signed)
Per EMS, pt. From home who reported of fall in her bathroom at around 2am, pt.'s daughter found pt. Unconscious on the bathroom floor and acquired laceration on left posterior head and left face. Pt. Recalled that she got up from bed and got something to drink and  passed out. Pt. Was alert and oriented upon EMS arrival and claimed that the same incident happened to her sometime in December 2014 ,seen by her MD and nothing abnormal found out. Pt. Is hypertensive.

## 2013-12-09 NOTE — ED Provider Notes (Signed)
CSN: [id number]     Arrival date & time 12/09/13  0258 History   First MD Initiated Contact with Patient 12/09/13 0258     Chief Complaint  Patient presents with  . Fall  . Loss of Consciousness  . Head Injury     (Consider location/radiation/quality/duration/timing/severity/associated sxs/prior Treatment) HPI Patient was in her normal state of health prior to going to sleep. She states she took a Benadryl before bed. She woke up around 1:30 to go to the bathroom. While at the sink she began to feel lightheaded.  She then remembers being on the floor in the bathroom. She sustained a laceration to the back of her head. She's complaining of mild left elbow pain. Patient states she's had similar episodes in the past with a negative workup. She states she still has some mild dizziness. She denies any focal weakness or numbness. She denies any posterior cervical spine tenderness. She last had a tetanus update 1 year ago.    Past Medical History  Diagnosis Date  . Arthritis   . Allergy   . Blood transfusion abn reaction or complication, no procedure mishap     73 years old after tonsilectomy   Past Surgical History  Procedure Laterality Date  . Tonsillectomy  1945  . Abdominal hysterectomy      partial 1983  . Foot surgery      bunectomy, hammertoe, bil   Family History  Problem Relation Age of Onset  . Heart disease Mother   . Hyperlipidemia Mother   . Stroke Mother   . Diabetes Father     type ll  . Heart disease Father 98    sudden death age 51  . Hyperlipidemia Father    History  Substance Use Topics  . Smoking status: Never Smoker   . Smokeless tobacco: Never Used  . Alcohol Use: Yes   OB History   Grav Para Term Preterm Abortions TAB SAB Ect Mult Living   6 5   1  1   5      Review of Systems  Constitutional: Negative for fever and chills.  HENT: Positive for facial swelling.   Respiratory: Negative for shortness of breath.   Gastrointestinal: Negative for nausea  and abdominal pain.  Genitourinary: Negative for dysuria, frequency and flank pain.  Musculoskeletal: Positive for arthralgias. Negative for back pain and neck pain.  Skin: Positive for wound.  Neurological: Positive for dizziness, syncope, light-headedness and headaches. Negative for seizures, weakness and numbness.  All other systems reviewed and are negative.      Allergies  Review of patient's allergies indicates no known allergies.  Home Medications   Current Outpatient Rx  Name  Route  Sig  Dispense  Refill  . aspirin 81 MG tablet   Oral   Take 81 mg by mouth daily.           [name] azelastine (ASTELIN) 137 MCG/SPRAY nasal spray   Nasal   Place 2 sprays into the nose 2 (two) times daily. Use in each nostril as directed   30 mL   5   . calcium carbonate (OS-CAL) 600 MG TABS   Oral   Take 600 mg by mouth 2 (two) times daily with a meal.           . fish oil-omega-3 fatty acids 1000 MG capsule   Oral   Take 2 g by mouth daily.           . magnesium 30 MG tablet  Oral   Take 30 mg by mouth 2 (two) times daily.           . Multiple Vitamins-Minerals (CENTRUM SILVER ULTRA WOMENS PO)   Oral   Take by mouth daily.            BP 154/76  Pulse 74  Temp(Src) 98.4 F (36.9 C) (Oral)  Resp 18  SpO2 98% Physical Exam  Nursing note and vitals reviewed. Constitutional: She is oriented to person, place, and time. She appears well-developed and well-nourished. No distress.  HENT:  Head: Normocephalic.  Mouth/Throat: Oropharynx is clear and moist.  Superficial abrasion over the left temporal area with swelling extending to the zygomatic process. Midface is stable. Patient has a roughly 3 -4 cm laceration to the left occiput. There is no active bleeding. No malocclusion  Eyes: EOM are normal. Pupils are equal, round, and reactive to light.  Neck: Normal range of motion. Neck supple.  No posterior midline cervical spine tenderness.  Cardiovascular: Normal rate and  regular rhythm.   Pulmonary/Chest: Effort normal and breath sounds normal. No respiratory distress. She has no wheezes. She has no rales.  Abdominal: Soft. Bowel sounds are normal. She exhibits no distension and no mass. There is no tenderness. There is no rebound and no guarding.  Musculoskeletal: Normal range of motion. She exhibits no edema and no tenderness.  Mild tenderness over the left radial head. She has full range of motion of the elbow. There is no swelling or deformity. 2+ radial pulses  Neurological: She is alert and oriented to person, place, and time.  Patient is alert and oriented x3 with clear, goal oriented speech. Patient has 5/5 motor in all extremities. Sensation is intact to light touch. Bilateral finger-to-nose is normal with no signs of dysmetria. Patient has a normal gait and walks without assistance.   Skin: Skin is warm and dry. No rash noted. No erythema.  Psychiatric: She has a normal mood and affect. Her behavior is normal.    ED Course  LACERATION REPAIR Date/Time: 12/09/2013 6:02 AM Performed by: Julianne Rice Authorized by: Lita Mains, Jade Burright Body area: head/neck Location details: scalp Laceration length: 3 cm Foreign bodies: no foreign bodies Tendon involvement: none Nerve involvement: none Vascular damage: no Irrigation solution: saline Irrigation method: syringe Amount of cleaning: standard Debridement: none Degree of undermining: none Skin closure: staples Number of sutures: 3 Technique: simple Approximation: close Approximation difficulty: simple Patient tolerance: Patient tolerated the procedure well with no immediate complications.   (including critical care time) Labs Review Labs Reviewed  CBC WITH DIFFERENTIAL  COMPREHENSIVE METABOLIC PANEL  TROPONIN I  URINALYSIS, ROUTINE W REFLEX MICROSCOPIC   Imaging Review No results found.   EKG Interpretation   Date/Time:  Sunday December 09 2013 03:12:39 EST Ventricular Rate:  81 PR  Interval:  144 QRS Duration: 98 QT Interval:  396 QTC Calculation: 460 R Axis:   59 Text Interpretation:  Sinus rhythm Confirmed by Lita Mains  MD, Rafi Kenneth  ([id number]) on 12/09/2013 4:52:01 AM      MDM   Final diagnoses:  None   Discuss CT findings with Dr. Sherwood Gambler who reviewed the images. Recommends repeat CT head in 24 hours. Stating he does not believe the patient needs to be transferred at this time. She continues to be well-appearing and neurologically intact. Discussed with the hospitalist and they will admit for observation.     Julianne Rice, MD 12/09/13 [phone]

## 2013-12-09 NOTE — ED Notes (Signed)
Patient transported to X-ray 

## 2013-12-10 ENCOUNTER — Inpatient Hospital Stay (HOSPITAL_COMMUNITY): Payer: Medicare HMO

## 2013-12-10 ENCOUNTER — Ambulatory Visit (HOSPITAL_COMMUNITY): Payer: Medicare Other

## 2013-12-10 DIAGNOSIS — R519 Headache, unspecified: Secondary | ICD-10-CM | POA: Diagnosis present

## 2013-12-10 DIAGNOSIS — R51 Headache: Secondary | ICD-10-CM | POA: Diagnosis present

## 2013-12-10 DIAGNOSIS — I629 Nontraumatic intracranial hemorrhage, unspecified: Secondary | ICD-10-CM

## 2013-12-10 DIAGNOSIS — I059 Rheumatic mitral valve disease, unspecified: Secondary | ICD-10-CM

## 2013-12-10 LAB — COMPREHENSIVE METABOLIC PANEL
ALT: 17 U/L (ref 0–35)
AST: 24 U/L (ref 0–37)
Albumin: 3 g/dL — ABNORMAL LOW (ref 3.5–5.2)
Alkaline Phosphatase: 58 U/L (ref 39–117)
BILIRUBIN TOTAL: 0.4 mg/dL (ref 0.3–1.2)
BUN: 10 mg/dL (ref 6–23)
CALCIUM: 9.1 mg/dL (ref 8.4–10.5)
CO2: 25 meq/L (ref 19–32)
Chloride: 108 mEq/L (ref 96–112)
Creatinine, Ser: 0.55 mg/dL (ref 0.50–1.10)
GLUCOSE: 121 mg/dL — AB (ref 70–99)
Potassium: 4 mEq/L (ref 3.7–5.3)
Sodium: 143 mEq/L (ref 137–147)
Total Protein: 5.4 g/dL — ABNORMAL LOW (ref 6.0–8.3)

## 2013-12-10 LAB — CBC
HCT: 35.6 % — ABNORMAL LOW (ref 36.0–46.0)
Hemoglobin: 12.1 g/dL (ref 12.0–15.0)
MCH: 32.3 pg (ref 26.0–34.0)
MCHC: 34 g/dL (ref 30.0–36.0)
MCV: 94.9 fL (ref 78.0–100.0)
PLATELETS: 153 10*3/uL (ref 150–400)
RBC: 3.75 MIL/uL — AB (ref 3.87–5.11)
RDW: 13 % (ref 11.5–15.5)
WBC: 7.3 10*3/uL (ref 4.0–10.5)

## 2013-12-10 LAB — HEMOGLOBIN A1C
HEMOGLOBIN A1C: 5.8 % — AB (ref <5.7)
MEAN PLASMA GLUCOSE: 120 mg/dL — AB (ref <117)

## 2013-12-10 LAB — TSH: TSH: 0.736 u[IU]/mL (ref 0.350–4.500)

## 2013-12-10 LAB — PHOSPHORUS: Phosphorus: 2.9 mg/dL (ref 2.3–4.6)

## 2013-12-10 LAB — MAGNESIUM: Magnesium: 2.1 mg/dL (ref 1.5–2.5)

## 2013-12-10 NOTE — Progress Notes (Signed)
UR completed.  Anitha Kreiser, RN BSN MHA CCM Trauma/Neuro ICU Case Manager [phone]  

## 2013-12-10 NOTE — Progress Notes (Signed)
Echocardiogram 2D Echocardiogram has been performed.  Robi Dewolfe 12/10/2013, 12:19 PM

## 2013-12-10 NOTE — Procedures (Signed)
EEG report.  Brief clinical history: 74 y/o with syncopal episode resulting in a small traumatic right frontal SAH.  No prior history of frank epileptic seizures.  Technique: this is a 17 channel routine scalp EEG performed at the bedside with bipolar and monopolar montages arranged in accordance to the international 10/20 system of electrode placement. One channel was dedicated to EKG recording.  The study was performed during wakefulness, drowsiness, and stage 2 sleep. No activating procedures perormed.  Description:In the wakeful state, the best background consisted of a medium amplitude, posterior dominant, well sustained, symmetric and reactive 9 Hz rhythm. Drowsiness demonstrated dropout of the alpha rhythm. Stage 2 sleep showed symmetric and synchronous sleep spindles without intermixed epileptiform discharges.  No focal or generalized epileptiform discharges noted.  No slowing seen.  EKG showed sinus rhythm.  Impression: this is a normal awake and asleep EEG. Please, be aware that a normal EEG does not exclude the possibility of epilepsy.  Clinical correlation is advised.  Dorian Pod, MD

## 2013-12-10 NOTE — Progress Notes (Signed)
Stroke Team Progress Note  HISTORY Megan Reilly is an 73 y.o. female, right handed, with a past medical history significant for arthritis, transferred to [facility] for further management of right frontal SAH.   She initially presented to [facility] ED 12/09/2013 after sustaining a fall at home and hitting her head. Mrs. Mckaskle stated that she took a tablet of Benadryl last night, woke up from sound sleep to go to the bathroom and was standing on the sink when she had " a funny feeling" and then passed out. Her daughter went to assist her immediately after the fall and denies noticing abnormal movements while she was in the floor. She regained consciousness quickly, had mild confusion by a very short period of time, and was able to remember falling to the floor. No bladder or bowel incontinence. She denies feeling dizzy or spinning sensation, chest pain, SOB, palpitations, HA, or true vertigo before the fall. Denies HA, double vision, focal weakness or numbness, slurred speech, language or vision impairment. It is worth noting that she recalls having a similar episode last year associated with low blood pressure. A CT brain performed at [facility] showed a small area of acute subarachnoid hemorrhage overlying the right frontal Lobe, reflecting a contrecoup injury. CT cervical spine revealed no fracture. At this moment, she is asymptomatic from a neurological standpoint.  Patient was not administerd TPA secondary to hemorrhage. She was admitted to the neuro ICU for further evaluation and treatment.  SUBJECTIVE Her daughter is at the bedside.  Overall she feels her condition is gradually improving. Patient had some vomiting this am.   OBJECTIVE Most recent Vital Signs: Filed Vitals:   12/10/13 0600 12/10/13 0700 12/10/13 0757 12/10/13 0800  BP: 125/54 150/60  137/63  Pulse: 73 69  65  Temp:   98.5 F (36.9 C)   TempSrc:   Oral   Resp: 19 19  18   Height:      Weight:      SpO2:  96%  94%   CBG (last 3)  No results  found for this basename: GLUCAP,  in the last 72 hours  IV Fluid Intake:     MEDICATIONS  . docusate sodium  100 mg Oral BID  . sodium chloride  3 mL Intravenous Q12H   PRN:  acetaminophen, acetaminophen, HYDROcodone-acetaminophen, ondansetron (ZOFRAN) IV, ondansetron  Diet:  General thin liquids Activity:  Bedrest with Bathroom privileges DVT Prophylaxis:  SCDs   CLINICALLY SIGNIFICANT STUDIES Basic Metabolic Panel:   Recent Labs Lab 12/09/13 0312 12/10/13 0247  NA 142 143  K 4.1 4.0  CL 104 108  CO2 25 25  GLUCOSE 122* 121*  BUN 16 10  CREATININE 0.71 0.55  CALCIUM 10.0 9.1  MG  --  2.1  PHOS  --  2.9   Liver Function Tests:   Recent Labs Lab 12/09/13 0312 12/10/13 0247  AST 40* 24  ALT 21 17  ALKPHOS 71 58  BILITOT 0.3 0.4  PROT 6.8 5.4*  ALBUMIN 4.1 3.0*   CBC:   Recent Labs Lab 12/09/13 0312 12/10/13 0247  WBC 11.2* 7.3  NEUTROABS 9.3*  --   HGB 14.0 12.1  HCT 41.1 35.6*  MCV 94.3 94.9  PLT 180 153   Coagulation:   Recent Labs Lab 12/09/13 0432  LABPROT 13.0  INR 1.00   Cardiac Enzymes:   Recent Labs Lab 12/09/13 1000 12/09/13 1434 12/09/13 2030  TROPONINI <0.30 <0.30 <0.30   Urinalysis:   Recent Labs Lab 12/09/13  [employer] 7.5  GLUCOSEU NEGATIVE  HGBUR NEGATIVE  BILIRUBINUR NEGATIVE  KETONESUR NEGATIVE  PROTEINUR NEGATIVE  UROBILINOGEN 0.2  NITRITE NEGATIVE  LEUKOCYTESUR MODERATE*   Lipid Panel    Component Value Date/Time   CHOL 206* 09/04/2013 1012   TRIG 105.0 09/04/2013 1012   HDL 50.80 09/04/2013 1012   CHOLHDL 4 09/04/2013 1012   VLDL 21.0 09/04/2013 1012   LDLCALC 134* 09/04/2013 1012   HgbA1C  No results found for this basename: HGBA1C   Urine Drug Screen:   No results found for this basename: labopia,  cocainscrnur,  labbenz,  amphetmu,  thcu,  labbarb    Alcohol Level: No results found for this basename: ETH,  in the last 168 hours  Dg Elbow Complete Left  12/09/2013   Suspect small elbow joint effusion; no definite evidence of fracture.       Ct Head and Spine 12/09/2013    1. Acute subarachnoid hemorrhage overlying the right frontal lobe, reflecting a contrecoup injury. 2. Soft tissue laceration and soft tissue swelling noted overlying the left occiput. 3. No evidence of acute fracture or subluxation along the cervical spine. 4. Mild cortical volume loss and scattered small vessel ischemic microangiopathy. 5. Mild degenerative change noted along the cervical spine. 6. 1.0 cm hypodensity at the right thyroid lobe. Consider further evaluation with thyroid ultrasound. If patient is clinically hyperthyroid, consider nuclear medicine thyroid uptake and scan. 7. Minimal calcification at the carotid bifurcations bilaterally.    CT of the brain  12/10/2013   Small amount of blood products in the right frontal lobe, this suggests hemorrhagic contusion and trace overlying subdural hematoma, less likely subarachnoid blood products.  Prominent left occipital vascular channel versus nondisplaced skull fractures underlying scalp hematoma.     MRI of the brain    MRA of the brain    2D Echocardiogram    Carotid Doppler  No evidence of hemodynamically significant internal carotid artery stenosis. Vertebral artery flow is antegrade.   CXR    EKG  normal sinus rhythm, sinus arrythmia. For complete results please see formal report.   Therapy Recommendations   Physical Exam   Awake alert. Afebrile. Head is nontraumatic. Neck is supple without bruit. Hearing is normal. Cardiac exam no murmur or gallop. Lungs are clear to auscultation. Distal pulses are well felt.:  Neurological Exam ;  Awake  Alert oriented x 3. Normal speech and language.eye movements full without nystagmus.fundi were not visualized. Vision acuity and fields appear normal. Hearing is normal. Palatal movements are normal. Face symmetric. Tongue midline. Normal strength, tone, reflexes and  coordination. Normal sensation. Gait deferred. ASSESSMENT Megan Reilly is a 73 y.o. female presenting following a fall. Imaging confirms a right frontal SAH. Hemorrhage felt to be traumatic secondary to fall. Reason for fall unknown; recommend hospitalists consult to further assess. Patient not thought to have a primary neurologic etiology.  On aspirin 81 mg orally every day prior to admission.  Patient with resultant headache. Work up underway.   "passed out" in December, thought to be syncopal from getting up too fast  Family hx stroke (mother)  Hospital day # 1  TREATMENT/PLAN  Transfer to the floor  Ask hospitalist to take over patient, ? etiology of fainting spells  Check EEG  PT consult  Dr. Leonie Man discussed diagnosis, prognosis,  treatment options and plan of care with patient and daughter.    SHARON BIBY, MSN, RN, ANVP-BC, ANP-BC, GNP-BC  [facility] Pager: [phone] 12/10/2013 9:25 AM  I have personally obtained a history, examined the patient, evaluated imaging results, and formulated the assessment and plan of care. I agree with the above. Antony Contras, MD

## 2013-12-10 NOTE — Progress Notes (Signed)
EEG Completed; Results Pending  

## 2013-12-10 NOTE — Progress Notes (Signed)
Pt. Requesting one early dose of pain med 45 min early. OK'd by Burnetta Sabin NP.

## 2013-12-11 MED ORDER — DSS 100 MG PO CAPS: 100.0000 mg | ORAL_CAPSULE | Freq: Two times a day (BID) | ORAL | Status: DC

## 2013-12-11 MED ORDER — ACETAMINOPHEN 325 MG PO TABS: 650.0000 mg | ORAL_TABLET | Freq: Four times a day (QID) | ORAL | Status: DC | PRN

## 2013-12-11 NOTE — Discharge Summary (Signed)
Stroke Discharge Summary  Patient ID: Megan Reilly   MRN: [id number]      DOB: 01-05-1941  Date of Admission: 12/09/2013 Date of Discharge: 12/11/2013  Attending Physician:  Megan Cloud, Reilly, Stroke Reilly  Consulting Physician(s):   Treatment Team:  Reilly Stroke, Reilly   Patient's PCP:  Megan Reilly  Discharge Diagnoses:  Principal Problem:   Subarachnoid hemorrhage following fall Active Problems:   Headache   Orthostasis   Syncope   Past Medical History  Diagnosis Date  . Arthritis   . Allergy   . Blood transfusion abn reaction or complication, no procedure mishap     74 years old after tonsilectomy   Past Surgical History  Procedure Laterality Date  . Tonsillectomy  1945  . Abdominal hysterectomy      partial 1983  . Foot surgery      bunectomy, hammertoe, bil      Medication List    STOP taking these medications       aspirin 81 MG tablet      TAKE these medications       acetaminophen 325 MG tablet  Commonly known as:  TYLENOL  Take 2 tablets (650 mg total) by mouth every 6 (six) hours as needed for mild pain (or Fever >/= 101).     azelastine 137 MCG/SPRAY nasal spray  Commonly known as:  ASTELIN  Place 2 sprays into the nose 2 (two) times daily. Use in each nostril as directed     calcium carbonate 600 MG Tabs tablet  Commonly known as:  OS-CAL  Take 600 mg by mouth 2 (two) times daily with a meal.     cholecalciferol 1000 UNITS tablet  Commonly known as:  VITAMIN D  Take 1,000 Units by mouth every morning.     DSS 100 MG Caps  Take 100 mg by mouth 2 (two) times daily.     fish oil-omega-3 fatty acids 1000 MG capsule  Take 1 g by mouth daily.     multivitamin with minerals Tabs tablet  Take 1 tablet by mouth every morning.        LABORATORY STUDIES CBC    Component Value Date/Time   WBC 7.3 12/10/2013 0247   RBC 3.75* 12/10/2013 0247   HGB 12.1 12/10/2013 0247   HCT 35.6* 12/10/2013 0247   PLT 153 12/10/2013 0247   MCV 94.9 12/10/2013  0247   MCH 32.3 12/10/2013 0247   MCHC 34.0 12/10/2013 0247   RDW 13.0 12/10/2013 0247   LYMPHSABS 1.2 12/09/2013 0312   MONOABS 0.5 12/09/2013 0312   EOSABS 0.1 12/09/2013 0312   BASOSABS 0.0 12/09/2013 0312   CMP    Component Value Date/Time   NA 143 12/10/2013 0247   K 4.0 12/10/2013 0247   CL 108 12/10/2013 0247   CO2 25 12/10/2013 0247   GLUCOSE 121* 12/10/2013 0247   BUN 10 12/10/2013 0247   CREATININE 0.55 12/10/2013 0247   CALCIUM 9.1 12/10/2013 0247   CALCIUM 10.0 01/19/2013 0910   PROT 5.4* 12/10/2013 0247   ALBUMIN 3.0* 12/10/2013 0247   AST 24 12/10/2013 0247   ALT 17 12/10/2013 0247   ALKPHOS 58 12/10/2013 0247   BILITOT 0.4 12/10/2013 0247   GFRNONAA >90 12/10/2013 0247   GFRAA >90 12/10/2013 0247   COAGS Lab Results  Component Value Date   INR 1.00 12/09/2013   Lipid Panel    Component Value Date/Time   CHOL 206* 09/04/2013 1012  TRIG 105.0 09/04/2013 1012   HDL 50.80 09/04/2013 1012   CHOLHDL 4 09/04/2013 1012   VLDL 21.0 09/04/2013 1012   LDLCALC 134* 09/04/2013 1012   HgbA1C  Lab Results  Component Value Date   HGBA1C 5.8* 12/10/2013   Cardiac Panel (last 3 results)  Recent Labs  12/09/13 1000 12/09/13 1434 12/09/13 2030  TROPONINI <0.30 <0.30 <0.30   Urinalysis    Component Value Date/Time   COLORURINE YELLOW 12/09/2013 0418   APPEARANCEUR CLEAR 12/09/2013 0418   LABSPEC 1.003* 12/09/2013 0418   PHURINE 7.5 12/09/2013 0418   GLUCOSEU NEGATIVE 12/09/2013 0418   HGBUR NEGATIVE 12/09/2013 0418   BILIRUBINUR NEGATIVE 12/09/2013 0418   BILIRUBINUR eng 05/23/2012 1157   KETONESUR NEGATIVE 12/09/2013 0418   PROTEINUR NEGATIVE 12/09/2013 0418   UROBILINOGEN 0.2 12/09/2013 0418   UROBILINOGEN 0.2 05/23/2012 1157   NITRITE NEGATIVE 12/09/2013 0418   NITRITE neg 05/23/2012 1157   LEUKOCYTESUR MODERATE* 12/09/2013 0418   Urine Drug Screen  No results found for this basename: labopia, cocainscrnur, labbenz, amphetmu, thcu, labbarb    Alcohol Level No results found for this basename: eth     SIGNIFICANT  DIAGNOSTIC STUDIES Dg Elbow Complete Left 12/09/2013 Suspect small elbow joint effusion; no definite evidence of fracture.  Ct Head and Spine 12/09/2013 1. Acute subarachnoid hemorrhage overlying the right frontal lobe, reflecting a contrecoup injury. 2. Soft tissue laceration and soft tissue swelling noted overlying the left occiput. 3. No evidence of acute fracture or subluxation along the cervical spine. 4. Mild cortical volume loss and scattered small vessel ischemic microangiopathy. 5. Mild degenerative change noted along the cervical spine. 6. 1.0 cm hypodensity at the right thyroid lobe. Consider further evaluation with thyroid ultrasound. If patient is clinically hyperthyroid, consider nuclear medicine thyroid uptake and scan. 7. Minimal calcification at the carotid bifurcations bilaterally.  CT of the brain 12/10/2013 Small amount of blood products in the right frontal lobe, this suggests hemorrhagic contusion and trace overlying subdural hematoma, less likely subarachnoid blood products. Prominent left occipital vascular channel versus nondisplaced skull fractures underlying scalp hematoma.  2D Echocardiogram EF 55-60% with no source of embolus.  Carotid Doppler No evidence of hemodynamically significant internal carotid artery stenosis. Vertebral artery flow is antegrade.  EEG this is a normal awake and asleep EEG. Please, be  aware that a normal EEG does not exclude the possibility of  epilepsy.  EKG normal sinus rhythm, sinus arrythmia. For complete results please see formal report.      History of Present Illness   Megan Reilly is an 73 y.o. female, right handed, with a past medical history significant for arthritis, transferred to [facility] for further management of right frontal SAH.  She initially presented to [facility] ED 12/09/2013 after sustaining a fall at home and hitting her head. Megan Reilly stated that she took a tablet of Benadryl last night, woke up from sound sleep to go to the bathroom and was  standing on the sink when she had " a funny feeling" and then passed out. Her daughter went to assist her immediately after the fall and denies noticing abnormal movements while she was in the floor. She regained consciousness quickly, had mild confusion by a very short period of time, and was able to remember falling to the floor. No bladder or bowel incontinence. She denies feeling dizzy or spinning sensation, chest pain, SOB, palpitations, HA, or true vertigo before the fall. Denies HA, double vision, focal weakness or numbness, slurred speech, language  or vision impairment. It is worth noting that she recalls having a similar episode last year associated with low blood pressure. A CT brain performed at [facility] showed a small area of acute subarachnoid hemorrhage overlying the right frontal Lobe, reflecting a contrecoup injury. CT cervical spine revealed no fracture. At this moment, she is asymptomatic from a neurological standpoint.  Patient was not administerd TPA secondary to hemorrhage. She was admitted to the neuro ICU for further evaluation and treatment.   Reilly Course Her right frontal SAH was to be traumatic secondary to fall. She had a previous fall in December 2014, thought to be vasovagal. Current fall likely the same etiology, though orthostatic vital signs showed increasing HR with standing (BP stable). Patient not thought to have a primary neurologic etiology. She should have cardiac eval for syncope. She was on aspirin 81 mg orally every day prior to admission which was discontinued.   Patient with continued stroke symptoms of headache alone. Physical therapy, occupational therapy and speech therapy evaluated patient. No therapy needs recommended at discharge.  Patient with vascular risk factors of:  Family hx stroke (mother)  Discharge Exam  Blood pressure 154/91, pulse 86, temperature 98.1 F (36.7 C), temperature source Oral, resp. rate 18, height 5\' 8"  (1.727 m), weight 71.7 kg (158  lb 1.1 oz), SpO2 99.00%.  Awake alert. Afebrile. Head is nontraumatic. Neck is supple without bruit. Hearing is normal. Cardiac exam no murmur or gallop. Lungs are clear to auscultation. Distal pulses are well felt. Neurological Exam Awake Alert oriented x 3. Normal speech and language.eye movements full without nystagmus.fundi were not visualized. Vision acuity and fields appear normal. Hearing is normal. Palatal movements are normal. Face symmetric. Tongue midline.  Normal strength, tone, reflexes and coordination. Normal sensation. Gait deferred.  Discharge Diet   General thin liquids  Discharge Plan    Disposition:  Home with family   OP cardiac eval of syncope, [place] consulted, they will call her with appt date/time  Ongoing risk factor control by Primary Care Physician.  Follow-up Megan Reilly 12/19/2013 at 1045, remove staples at that time  Follow-up with Dr. Antony Contras, Stroke Clinic in 2 months.  35 minutes were spent preparing discharge.  Signed Burnetta Sabin, AVNP-BC, ANP-BC, [facility] Nurse Practitioner 12/11/2013, 1:38 PM  I have personally examined this patient, reviewed pertinent data and developed the plan of care. I agree with above.  Antony Contras, Reilly

## 2013-12-11 NOTE — Progress Notes (Signed)
Stroke Team Progress Note  HISTORY Megan Reilly is an 73 y.o. female, right handed, with a past medical history significant for arthritis, transferred to [facility] for further management of right frontal SAH.   She initially presented to [facility] ED 12/09/2013 after sustaining a fall at home and hitting her head. Megan Reilly stated that she took a tablet of Benadryl last night, woke up from sound sleep to go to the bathroom and was standing on the sink when she had " a funny feeling" and then passed out. Her daughter went to assist her immediately after the fall and denies noticing abnormal movements while she was in the floor. She regained consciousness quickly, had mild confusion by a very short period of time, and was able to remember falling to the floor. No bladder or bowel incontinence. She denies feeling dizzy or spinning sensation, chest pain, SOB, palpitations, HA, or true vertigo before the fall. Denies HA, double vision, focal weakness or numbness, slurred speech, language or vision impairment. It is worth noting that she recalls having a similar episode last year associated with low blood pressure. A CT brain performed at [facility] showed a small area of acute subarachnoid hemorrhage overlying the right frontal Lobe, reflecting a contrecoup injury. CT cervical spine revealed no fracture. At this moment, she is asymptomatic from a neurological standpoint.  Patient was not administerd TPA secondary to hemorrhage. She was admitted to the neuro ICU for further evaluation and treatment.  SUBJECTIVE Patient up in bed with family at bedside. She is anxious to go home.  OBJECTIVE Most recent Vital Signs: Filed Vitals:   12/11/13 0600 12/11/13 0601 12/11/13 0602 12/11/13 0957  BP: 131/88 155/101 130/85 154/91  Pulse: 98 113 92 86  Temp:    98.1 F (36.7 C)  TempSrc:    Oral  Resp:    18  Height:      Weight:      SpO2:    99%   CBG (last 3)  No results found for this basename: GLUCAP,  in the last 72  hours  IV Fluid Intake:     MEDICATIONS  . docusate sodium  100 mg Oral BID  . sodium chloride  3 mL Intravenous Q12H   PRN:  acetaminophen, acetaminophen, HYDROcodone-acetaminophen, ondansetron (ZOFRAN) IV, ondansetron  Diet:  General thin liquids Activity:  Up with assistance DVT Prophylaxis:  SCDs   CLINICALLY SIGNIFICANT STUDIES Basic Metabolic Panel:   Recent Labs Lab 12/09/13 0312 12/10/13 0247  NA 142 143  K 4.1 4.0  CL 104 108  CO2 25 25  GLUCOSE 122* 121*  BUN 16 10  CREATININE 0.71 0.55  CALCIUM 10.0 9.1  MG  --  2.1  PHOS  --  2.9   Liver Function Tests:   Recent Labs Lab 12/09/13 0312 12/10/13 0247  AST 40* 24  ALT 21 17  ALKPHOS 71 58  BILITOT 0.3 0.4  PROT 6.8 5.4*  ALBUMIN 4.1 3.0*   CBC:   Recent Labs Lab 12/09/13 0312 12/10/13 0247  WBC 11.2* 7.3  NEUTROABS 9.3*  --   HGB 14.0 12.1  HCT 41.1 35.6*  MCV 94.3 94.9  PLT 180 153   Coagulation:   Recent Labs Lab 12/09/13 0432  LABPROT 13.0  INR 1.00   Cardiac Enzymes:   Recent Labs Lab 12/09/13 1000 12/09/13 1434 12/09/13 2030  TROPONINI <0.30 <0.30 <0.30   Urinalysis:   Recent Labs Lab 12/09/13 [place]  LABSPEC 1.003*  PHURINE  7.5  GLUCOSEU NEGATIVE  HGBUR NEGATIVE  BILIRUBINUR NEGATIVE  KETONESUR NEGATIVE  PROTEINUR NEGATIVE  UROBILINOGEN 0.2  NITRITE NEGATIVE  LEUKOCYTESUR MODERATE*   Lipid Panel    Component Value Date/Time   CHOL 206* 09/04/2013 1012   TRIG 105.0 09/04/2013 1012   HDL 50.80 09/04/2013 1012   CHOLHDL 4 09/04/2013 1012   VLDL 21.0 09/04/2013 1012   LDLCALC 134* 09/04/2013 1012   HgbA1C  Lab Results  Component Value Date   HGBA1C 5.8* 12/10/2013   Urine Drug Screen:   No results found for this basename: labopia,  cocainscrnur,  labbenz,  amphetmu,  thcu,  labbarb    Alcohol Level: No results found for this basename: ETH,  in the last 168 hours  Dg Elbow Complete Left 12/09/2013   Suspect small elbow joint effusion;  no definite evidence of fracture.       Ct Head and Spine 12/09/2013    1. Acute subarachnoid hemorrhage overlying the right frontal lobe, reflecting a contrecoup injury. 2. Soft tissue laceration and soft tissue swelling noted overlying the left occiput. 3. No evidence of acute fracture or subluxation along the cervical spine. 4. Mild cortical volume loss and scattered small vessel ischemic microangiopathy. 5. Mild degenerative change noted along the cervical spine. 6. 1.0 cm hypodensity at the right thyroid lobe. Consider further evaluation with thyroid ultrasound. If patient is clinically hyperthyroid, consider nuclear medicine thyroid uptake and scan. 7. Minimal calcification at the carotid bifurcations bilaterally.    CT of the brain  12/10/2013   Small amount of blood products in the right frontal lobe, this suggests hemorrhagic contusion and trace overlying subdural hematoma, less likely subarachnoid blood products.  Prominent left occipital vascular channel versus nondisplaced skull fractures underlying scalp hematoma.     2D Echocardiogram  EF 55-60% with no source of embolus.   Carotid Doppler  No evidence of hemodynamically significant internal carotid artery stenosis. Vertebral artery flow is antegrade.   EEG  this is a normal awake and asleep EEG. Please, be  aware that a normal EEG does not exclude the possibility of  epilepsy.   EKG  normal sinus rhythm, sinus arrythmia. For complete results please see formal report.   Therapy Recommendations no PT needs  Physical Exam   Awake alert. Afebrile. Head is nontraumatic. Neck is supple without bruit. Hearing is normal. Cardiac exam no murmur or gallop. Lungs are clear to auscultation. Distal pulses are well felt.:  Neurological Exam ;  Awake  Alert oriented x 3. Normal speech and language.eye movements full without nystagmus.fundi were not visualized. Vision acuity and fields appear normal. Hearing is normal. Palatal movements are normal.  Face symmetric. Tongue midline. Normal strength, tone, reflexes and coordination. Normal sensation. Gait deferred.  ASSESSMENT Megan Reilly is a 73 y.o. female presenting following a fall. Imaging confirms a right frontal SAH. Hemorrhage felt to be traumatic secondary to fall. Reason for fall in Dec thought to be vasovagal - this fall likely the same etiology, though based on increasing HR with orthostatic vital signs, that may be the etiology. She should have cardiac eval for syncope. Patient not thought to have a primary neurologic etiology.  On aspirin 81 mg orally every day prior to admission.  Patient with resultant headache. Work up completed.   "passed out" in December, thought to be syncopal from getting up too fast  Family hx stroke (mother)  Hospital day # 2  TREATMENT/PLAN  Discharge home  OP cardiac eval of  syncope  Staple removed by primary care in 1 week, Dr. Elease Hashimoto 12/19/2013 at 19  Follow up Dr. Leonie Man in 2 months  Dr. Leonie Man discussed diagnosis, prognosis,  treatment options and plan of care with patient and daughter.    Burnetta Sabin, MSN, RN, ANVP-BC, ANP-BC, [facility] Pager: [phone] 12/11/2013 11:25 AM  I have personally obtained a history, examined the patient, evaluated imaging results, and formulated the assessment and plan of care. I agree with the above.  Antony Contras, MD

## 2013-12-11 NOTE — Evaluation (Signed)
Physical Therapy Evaluation Patient Details Name: Megan Reilly MRN: [id number] DOB: 14-Dec-1940 Today's Date: 12/11/2013 Time: 1014-1030 PT Time Calculation (min): 16 min  PT Assessment / Plan / Recommendation History of Present Illness  patient is a 73 yo female s/p syncope and fall hitting her head cause a small SAH.   Clinical Impression  Patient independent, no acute PT needs, will sign off, patient in agreement.    PT Assessment  Patent does not need any further PT services    Follow Up Recommendations  No PT follow up    Does the patient have the potential to tolerate intense rehabilitation      Barriers to Discharge        Equipment Recommendations  None recommended by PT    Recommendations for Other Services     Frequency      Precautions / Restrictions Precautions Precautions: Fall   Pertinent Vitals/Pain No pain at this time      Mobility  Bed Mobility Overal bed mobility: Independent Transfers Overall transfer level: Independent Ambulation/Gait Ambulation/Gait assistance: Independent Ambulation Distance (Feet): 640 Feet Stairs: Yes Stairs assistance: Independent Stair Management: One rail Right;Step to pattern;Forwards Number of Stairs: 4        PT Goals(Current goals can be found in the care plan section) Acute Rehab PT Goals PT Goal Formulation: No goals set, d/c therapy  Visit Information  Last PT Received On: 12/11/13 Assistance Needed: +1 History of Present Illness: patient is a 73 yo female s/p syncope and fall hitting her head cause a small SAH.        Prior Rafael Hernandez expects to be discharged to:: Private residence Living Arrangements: Alone Available Help at Discharge: Family Type of Home: House Home Access: Stairs to enter Technical brewer of Steps: 2 Entrance Stairs-Rails: Right;Left Home Layout: One level Home Equipment: None Additional Comments: walkin shower, grab bars, elevate4d  commode Prior Function Level of Independence: Independent Dominant Hand: Right    Cognition  Cognition Arousal/Alertness: Awake/alert    Extremity/Trunk Assessment Upper Extremity Assessment Upper Extremity Assessment: Overall WFL for tasks assessed Lower Extremity Assessment Lower Extremity Assessment: Overall WFL for tasks assessed   Balance Balance Overall balance assessment: No apparent balance deficits (not formally assessed) Standardized Balance Assessment Standardized Balance Assessment : Dynamic Gait Index Dynamic Gait Index Level Surface: Normal Change in Gait Speed: Mild Impairment Gait with Horizontal Head Turns: Normal Gait with Vertical Head Turns: Normal Gait and Pivot Turn: Normal Step Over Obstacle: Normal Step Around Obstacles: Normal Steps: Mild Impairment Total Score: 22  End of Session PT - End of Session Equipment Utilized During Treatment: Gait belt Activity Tolerance: Patient tolerated treatment well Patient left: in bed;with call bell/phone within reach;with family/visitor present Nurse Communication: Mobility status  GP     Duncan Dull 12/11/2013, 10:51 AM Alben Deeds, [place] DPT  [phone]

## 2013-12-11 NOTE — Progress Notes (Signed)
Pt. Wanting to independently take self to the bathroom; per report from night shift RN was independent in ICU. Pt. Refusing bed alarm. Will monitor.

## 2013-12-13 ENCOUNTER — Telehealth: Payer: Self-pay | Admitting: Family Medicine

## 2013-12-13 NOTE — Telephone Encounter (Signed)
Patient Information:  Caller Name: Bell  Phone: [phone]  Patient: Megan, Reilly  Gender: Female  DOB: [birth year]  Age: 73 Years  PCP: Carolann Littler (Family Practice)  Office Follow Up:  Does the office need to follow up with this patient?: No  Instructions For The Office: N/A  RN Note:  Cosmetic question;  Home care advice: May continue ice for bruising but time is what will fade it away.  Symptoms  Reason For Call & Symptoms: In hospital over wknd for fall in bathroom;  Hit her left eyebrow and has a bruise that is all over the left eye and underneath;  Ice put to it for swelling in the hospital;  No more swelling or pain;  Now just a bad black and blue color and asking what she could do to make the bruising go away sooner?  Has hospital follow up appt.  Reviewed Health History In EMR: N/A  Reviewed Medications In EMR: N/A  Reviewed Allergies In EMR: N/A  Reviewed Surgeries / Procedures: N/A  Date of Onset of Symptoms: 12/13/2013  Guideline(s) Used:  No Protocol Available - Information Only  Disposition Per Guideline:   Home Care  Reason For Disposition Reached:   Information only question and nurse able to answer  Advice Given:  Call Back If:  New symptoms develop  Patient Will Follow Care Advice:  YES

## 2013-12-13 NOTE — Telephone Encounter (Signed)
Noted  

## 2013-12-19 ENCOUNTER — Telehealth: Payer: Self-pay

## 2013-12-19 ENCOUNTER — Encounter: Payer: Self-pay | Admitting: Family Medicine

## 2013-12-19 ENCOUNTER — Ambulatory Visit (INDEPENDENT_AMBULATORY_CARE_PROVIDER_SITE_OTHER): Payer: Medicare HMO | Admitting: Family Medicine

## 2013-12-19 VITALS — BP 138/72 | HR 89 | Wt 153.0 lb

## 2013-12-19 DIAGNOSIS — I609 Nontraumatic subarachnoid hemorrhage, unspecified: Secondary | ICD-10-CM

## 2013-12-19 DIAGNOSIS — S0101XA Laceration without foreign body of scalp, initial encounter: Secondary | ICD-10-CM

## 2013-12-19 DIAGNOSIS — R55 Syncope and collapse: Secondary | ICD-10-CM

## 2013-12-19 DIAGNOSIS — S0100XA Unspecified open wound of scalp, initial encounter: Secondary | ICD-10-CM

## 2013-12-19 NOTE — Telephone Encounter (Signed)
Pt wants to know do you think she should go to see a Cardiology. Pt stated that her test came back normal.

## 2013-12-19 NOTE — Telephone Encounter (Signed)
no

## 2013-12-19 NOTE — Telephone Encounter (Signed)
Pt informed

## 2013-12-19 NOTE — Patient Instructions (Signed)
Vasovagal Syncope, Adult  Syncope, commonly known as fainting, is a temporary loss of consciousness. It occurs when the blood flow to the brain is reduced. Vasovagal syncope (also called neurocardiogenic syncope) is a fainting spell in which the blood flow to the brain is reduced because of a sudden drop in heart rate and blood pressure. Vasovagal syncope occurs when the brain and the cardiovascular system (blood vessels) do not adequately communicate and respond to each other. This is the most common cause of fainting. It often occurs in response to fear or some other type of emotional or physical stress. The body has a reaction in which the heart starts beating too slowly or the blood vessels expand, reducing blood pressure. This type of fainting spell is generally considered harmless. However, injuries can occur if a person takes a sudden fall during a fainting spell.   CAUSES   Vasovagal syncope occurs when a person's blood pressure and heart rate decrease suddenly, usually in response to a trigger. Many things and situations can trigger an episode. Some of these include:   · Pain.    · Fear.    · The sight of blood or medical procedures, such as blood being drawn from a vein.    · Common activities, such as coughing, swallowing, stretching, or going to the bathroom.    · Emotional stress.    · Prolonged standing, especially in a warm environment.    · Lack of sleep or rest.    · Prolonged lack of food.    · Prolonged lack of fluids.    · Recent illness.  · The use of certain drugs that affect blood pressure, such as cocaine, alcohol, marijuana, inhalants, and opiates.    SYMPTOMS   Before the fainting episode, you may:   · Feel dizzy or light headed.    · Become pale.  · Sense that you are going to faint.    · Feel like the room is spinning.    · Have tunnel vision, only seeing directly in front of you.    · Feel sick to your stomach (nauseous).    · See spots or slowly lose vision.    · Hear ringing in your  ears.    · Have a headache.    · Feel warm and sweaty.    · Feel a sensation of pins and needles.  During the fainting spell, you will generally be unconscious for no longer than a couple minutes before waking up and returning to normal. If you get up too quickly before your body can recover, you may faint again. Some twitching or jerky movements may occur during the fainting spell.   DIAGNOSIS   Your caregiver will ask about your symptoms, take a medical history, and perform a physical exam. Various tests may be done to rule out other causes of fainting. These may include blood tests and tests to check the heart, such as electrocardiography, echocardiography, and possibly an electrophysiology study. When other causes have been ruled out, a test may be done to check the body's response to changes in position (tilt table test).  TREATMENT   Most cases of vasovagal syncope do not require treatment. Your caregiver may recommend ways to avoid fainting triggers and may provide home strategies for preventing fainting. If you must be exposed to a possible trigger, you can drink additional fluids to help reduce your chances of having an episode of vasovagal syncope. If you have warning signs of an oncoming episode, you can respond by positioning yourself favorably (lying down).  If your fainting spells continue, you may be   given medicines to prevent fainting. Some medicines may help make you more resistant to repeated episodes of vasovagal syncope. Special exercises or compression stockings may be recommended. In rare cases, the surgical placement of a pacemaker is considered.  HOME CARE INSTRUCTIONS   · Learn to identify the warning signs of vasovagal syncope.    · Sit or lie down at the first warning sign of a fainting spell. If sitting, put your head down between your legs. If you lie down, swing your legs up in the air to increase blood flow to the brain.    · Avoid hot tubs and saunas.  · Avoid prolonged  standing.  · Drink enough fluids to keep your urine clear or pale yellow. Avoid caffeine.  · Increase salt in your diet as directed by your caregiver.    · If you have to stand for a long time, perform movements such as:    · Crossing your legs.    · Flexing and stretching your leg muscles.    · Squatting.    · Moving your legs.    · Bending over.    · Only take over-the-counter or prescription medicines as directed by your caregiver. Do not suddenly stop any medicines without asking your caregiver first.   SEEK MEDICAL CARE IF:   · Your fainting spells continue or happen more frequently in spite of treatment.    · You lose consciousness for more than a couple minutes.  · You have fainting spells during or after exercising or after being startled.    · You have new symptoms that occur with the fainting spells, such as:    · Shortness of breath.  · Chest pain.    · Irregular heartbeat.    · You have episodes of twitching or jerky movements that last longer than a few seconds.  · You have episodes of twitching or jerky movements without obvious fainting.  SEEK IMMEDIATE MEDICAL CARE IF:   · You have injuries or bleeding after a fainting spell.    · You have episodes of twitching or jerky movements that last longer than 5 minutes.    · You have more than one spell of twitching or jerky movements before returning to consciousness after fainting.  MAKE SURE YOU:   · Understand these instructions.  · Will watch your condition.  · Will get help right away if you are not doing well or get worse.  Document Released: 09/13/2012 Document Reviewed: 09/13/2012  [employer]® Patient Information ©2014 [employer].

## 2013-12-19 NOTE — Progress Notes (Signed)
Subjective:    Patient ID: Megan Reilly, female    DOB: [birth year], 73 y.o.   MRN: [id number]  Suture / Staple Removal   Hospital followup. Patient was getting up in the middle and night on 12/09/2013. She was getting ready to urinate when she felt dizzy and had what sounds like vasovagal syncope. She was only out transiently. There was no confusion afterwards. She had taken Benadryl that same night. She's fell and struck her head on the left side. had very brief loss of consciousness. She has no history of seizure. She was admitted. She had question of acute subarachnoid hemorrhage on the right frontal lobe reflecting a contrecoup injury. Followup CT did not confirm any definite subarachnoid but question of small hematoma and contusion. Patient never had any confusion. No significant headaches. She is doing well this time. She had laceration left occipital area and had staples placed.  Patient did not have any arrhythmias. Echocardiogram unremarkable. Carotid Dopplers unremarkable. EEG no seizure activity. She had left elbow pain x-rays reveal no fracture. CT of spine no acute findings.  Past Medical History  Diagnosis Date  . Arthritis   . Allergy   . Blood transfusion abn reaction or complication, no procedure mishap     73 years old after tonsilectomy   Past Surgical History  Procedure Laterality Date  . Tonsillectomy  1945  . Abdominal hysterectomy      partial 1983  . Foot surgery      bunectomy, hammertoe, bil    reports that she has never smoked. She has never used smokeless tobacco. She reports that she drinks alcohol. She reports that she does not use illicit drugs. family history includes Diabetes in her father; Heart disease in her mother; Heart disease (age of onset: 49) in her father; Hyperlipidemia in her father and mother; Stroke in her mother. No Known Allergies    Review of Systems  Constitutional: Negative for fatigue.  Eyes: Negative for visual disturbance.    Respiratory: Negative for cough, chest tightness, shortness of breath and wheezing.   Cardiovascular: Negative for chest pain, palpitations and leg swelling.  Neurological: Negative for dizziness, seizures, syncope, weakness, light-headedness and headaches.       Objective:   Physical Exam  Constitutional: She is oriented to person, place, and time. She appears well-developed and well-nourished.  HENT:  Right Ear: External ear normal.  Left Ear: External ear normal.  Patient's 3 staples left occipital region of scalp. These are removed without difficulty. No signs of secondary infection  Neck: Neck supple.  Cardiovascular: Normal rate and regular rhythm.   Pulmonary/Chest: Effort normal and breath sounds normal. No respiratory distress. She has no wheezes. She has no rales.  Musculoskeletal: She exhibits no edema.  Lymphadenopathy:    She has no cervical adenopathy.  Neurological: She is alert and oriented to person, place, and time. No cranial nerve deficit.  Psychiatric: She has a normal mood and affect. Her behavior is normal. Judgment and thought content normal.          Assessment & Plan:  Status post recent syncope with what sounds like vasovagal syncope. Extensive workup as above unrevealing. She had question of small subarachnoid hemorrhage which is stable with followup CT scan. She's been taken off aspirin. We've suggested increased hydration and she is currently taking Gatorade at night. She's not had any residual symptoms. Staples are removed from her scalp laceration. This is healing well. She is encouraged to avoid further Benadryl at  night and to get up slowly. She does not take any medications and does not have any orthostatic change in blood pressure today

## 2013-12-19 NOTE — Progress Notes (Signed)
Pre visit review using our clinic review tool, if applicable. No additional management support is needed unless otherwise documented below in the visit note. 

## 2013-12-24 ENCOUNTER — Ambulatory Visit (HOSPITAL_COMMUNITY)
Admission: RE | Admit: 2013-12-24 | Discharge: 2013-12-24 | Disposition: A | Discharge status: Home / Self Care | Payer: Medicare HMO | Source: Ambulatory Visit | Attending: Women's Health | Admitting: Women's Health

## 2013-12-24 ENCOUNTER — Telehealth: Payer: Self-pay

## 2013-12-24 DIAGNOSIS — Z1231 Encounter for screening mammogram for malignant neoplasm of breast: Secondary | ICD-10-CM

## 2013-12-24 LAB — HM MAMMOGRAPHY: HM Mammogram: NEGATIVE

## 2013-12-24 NOTE — Telephone Encounter (Signed)
Pt called and stated the she has a appt with the Neurology doctor. Pt was wondering do you think she should go since everything came back normal.

## 2013-12-24 NOTE — Telephone Encounter (Signed)
Left detailed message on pt VM.

## 2013-12-24 NOTE — Telephone Encounter (Signed)
i would still see them

## 2014-04-04 ENCOUNTER — Ambulatory Visit: Payer: Medicare Other | Admitting: Neurology

## 2014-06-06 ENCOUNTER — Ambulatory Visit: Payer: Medicare Other | Admitting: Neurology

## 2014-07-15 ENCOUNTER — Ambulatory Visit (INDEPENDENT_AMBULATORY_CARE_PROVIDER_SITE_OTHER): Payer: Medicare HMO

## 2014-07-15 DIAGNOSIS — Z23 Encounter for immunization: Secondary | ICD-10-CM

## 2014-08-12 ENCOUNTER — Encounter: Payer: Self-pay | Admitting: Family Medicine

## 2014-09-10 ENCOUNTER — Encounter: Payer: Medicare HMO | Admitting: Family Medicine

## 2014-09-17 ENCOUNTER — Encounter: Payer: Self-pay | Admitting: Family Medicine

## 2014-09-17 ENCOUNTER — Ambulatory Visit (INDEPENDENT_AMBULATORY_CARE_PROVIDER_SITE_OTHER): Payer: Medicare HMO | Admitting: Family Medicine

## 2014-09-17 VITALS — BP 140/80 | HR 74 | Temp 97.8°F | Ht 67.0 in | Wt 155.0 lb

## 2014-09-17 DIAGNOSIS — Z0000 Encounter for general adult medical examination without abnormal findings: Secondary | ICD-10-CM

## 2014-09-17 DIAGNOSIS — M859 Disorder of bone density and structure, unspecified: Secondary | ICD-10-CM

## 2014-09-17 LAB — TSH: TSH: 1.58 u[IU]/mL (ref 0.35–4.50)

## 2014-09-17 LAB — CBC WITH DIFFERENTIAL/PLATELET
Basophils Absolute: 0 10*3/uL (ref 0.0–0.1)
Basophils Relative: 0.6 % (ref 0.0–3.0)
EOS PCT: 0.9 % (ref 0.0–5.0)
Eosinophils Absolute: 0.1 10*3/uL (ref 0.0–0.7)
HEMATOCRIT: 40.4 % (ref 36.0–46.0)
HEMOGLOBIN: 13.3 g/dL (ref 12.0–15.0)
LYMPHS ABS: 1.4 10*3/uL (ref 0.7–4.0)
Lymphocytes Relative: 20.3 % (ref 12.0–46.0)
MCHC: 33 g/dL (ref 30.0–36.0)
MCV: 97 fl (ref 78.0–100.0)
MONO ABS: 0.5 10*3/uL (ref 0.1–1.0)
MONOS PCT: 7.7 % (ref 3.0–12.0)
NEUTROS ABS: 4.7 10*3/uL (ref 1.4–7.7)
Neutrophils Relative %: 70.5 % (ref 43.0–77.0)
PLATELETS: 196 10*3/uL (ref 150.0–400.0)
RBC: 4.17 Mil/uL (ref 3.87–5.11)
RDW: 12.6 % (ref 11.5–15.5)
WBC: 6.7 10*3/uL (ref 4.0–10.5)

## 2014-09-17 LAB — HEPATIC FUNCTION PANEL
ALBUMIN: 4.4 g/dL (ref 3.5–5.2)
ALT: 15 U/L (ref 0–35)
AST: 24 U/L (ref 0–37)
Alkaline Phosphatase: 63 U/L (ref 39–117)
Bilirubin, Direct: 0.1 mg/dL (ref 0.0–0.3)
Total Bilirubin: 1 mg/dL (ref 0.2–1.2)
Total Protein: 6.6 g/dL (ref 6.0–8.3)

## 2014-09-17 LAB — LIPID PANEL
CHOLESTEROL: 195 mg/dL (ref 0–200)
HDL: 63.9 mg/dL (ref >39.00)
LDL Cholesterol: 109 mg/dL — ABNORMAL HIGH (ref 0–99)
NONHDL: 131.1
Total CHOL/HDL Ratio: 3
Triglycerides: 112 mg/dL (ref 0.0–149.0)
VLDL: 22.4 mg/dL (ref 0.0–40.0)

## 2014-09-17 LAB — BASIC METABOLIC PANEL
BUN: 11 mg/dL (ref 6–23)
CALCIUM: 10 mg/dL (ref 8.4–10.5)
CO2: 26 mEq/L (ref 19–32)
Chloride: 107 mEq/L (ref 96–112)
Creatinine, Ser: 0.7 mg/dL (ref 0.4–1.2)
GFR: 88.46 mL/min (ref >60.00)
Glucose, Bld: 103 mg/dL — ABNORMAL HIGH (ref 70–99)
POTASSIUM: 4.5 meq/L (ref 3.5–5.1)
SODIUM: 141 meq/L (ref 135–145)

## 2014-09-17 LAB — VITAMIN D 25 HYDROXY (VIT D DEFICIENCY, FRACTURES): VITD: 49.76 ng/mL (ref 30.00–100.00)

## 2014-09-17 NOTE — Progress Notes (Signed)
Pre visit review using our clinic review tool, if applicable. No additional management support is needed unless otherwise documented below in the visit note. 

## 2014-09-17 NOTE — Progress Notes (Signed)
   Subjective:    Patient ID: Megan Reilly, female    DOB: 02-23-1941, 73 y.o.   MRN: [id number]  HPI Patient seen for wellness visit/complete physical. He generally very healthy. She's not taking any regular prescribed medications. She has history of osteopenia. She still sees gynecologist. Mammograms up-to-date. She had colonoscopy 2006 which was normal. Takes regular calcium and vitamin D supplementation.  Patient had single episode last spring which sounded like vasovagal. She's had absolutely no recurrence since then. No headaches. No dizziness. No chest pains. Vaccinations up-to-date  Past Medical History  Diagnosis Date  . Arthritis   . Allergy   . Blood transfusion abn reaction or complication, no procedure mishap     73 years old after tonsilectomy   Past Surgical History  Procedure Laterality Date  . Tonsillectomy  1945  . Abdominal hysterectomy      partial 1983  . Foot surgery      bunectomy, hammertoe, bil    reports that she has never smoked. She has never used smokeless tobacco. She reports that she drinks alcohol. She reports that she does not use illicit drugs. family history includes Diabetes in her father; Heart disease in her mother; Heart disease (age of onset: 38) in her father; Hyperlipidemia in her father and mother; Stroke in her mother. No Known Allergies    Review of Systems  Constitutional: Negative for fever, activity change, appetite change, fatigue and unexpected weight change.  HENT: Negative for ear pain, hearing loss, sore throat and trouble swallowing.   Eyes: Negative for visual disturbance.  Respiratory: Negative for cough and shortness of breath.   Cardiovascular: Negative for chest pain and palpitations.  Gastrointestinal: Negative for abdominal pain, diarrhea, constipation and blood in stool.  Genitourinary: Negative for dysuria and hematuria.  Musculoskeletal: Positive for arthralgias. Negative for myalgias and back pain.  Skin: Negative  for rash.  Neurological: Negative for dizziness, syncope and headaches.  Hematological: Negative for adenopathy.  Psychiatric/Behavioral: Negative for confusion and dysphoric mood.       Objective:   Physical Exam  Constitutional: She is oriented to person, place, and time. She appears well-developed and well-nourished.  HENT:  Head: Normocephalic and atraumatic.  Eyes: EOM are normal. Pupils are equal, round, and reactive to light.  Neck: Normal range of motion. Neck supple. No thyromegaly present.  Cardiovascular: Normal rate, regular rhythm and normal heart sounds.   No murmur heard. Pulmonary/Chest: Breath sounds normal. No respiratory distress. She has no wheezes. She has no rales.  Abdominal: Soft. Bowel sounds are normal. She exhibits no distension and no mass. There is no tenderness. There is no rebound and no guarding.  Musculoskeletal: Normal range of motion. She exhibits no edema.  Lymphadenopathy:    She has no cervical adenopathy.  Neurological: She is alert and oriented to person, place, and time. She displays normal reflexes. No cranial nerve deficit.  Skin: No rash noted.  Psychiatric: She has a normal mood and affect. Her behavior is normal. Judgment and thought content normal.          Assessment & Plan:  Complete physical. Immunizations up-to-date. Labs including 25-hydroxy vitamin D. Patient will schedule repeat colonoscopy this coming year. We discussed the importance of regular calcium and vitamin D along with weightbearing exercise.

## 2014-09-17 NOTE — Patient Instructions (Signed)
Consider repeat colonoscopy in 2016

## 2014-11-20 ENCOUNTER — Encounter: Payer: Self-pay | Admitting: Internal Medicine

## 2014-11-20 ENCOUNTER — Other Ambulatory Visit: Payer: Self-pay | Admitting: Family Medicine

## 2014-11-20 DIAGNOSIS — Z1231 Encounter for screening mammogram for malignant neoplasm of breast: Secondary | ICD-10-CM

## 2014-12-03 DIAGNOSIS — C4491 Basal cell carcinoma of skin, unspecified: Secondary | ICD-10-CM

## 2014-12-03 HISTORY — DX: Basal cell carcinoma of skin, unspecified: C44.91

## 2014-12-17 ENCOUNTER — Other Ambulatory Visit: Payer: Self-pay | Admitting: Women's Health

## 2014-12-17 ENCOUNTER — Other Ambulatory Visit: Payer: Self-pay | Admitting: Gynecology

## 2014-12-17 DIAGNOSIS — M8580 Other specified disorders of bone density and structure, unspecified site: Secondary | ICD-10-CM

## 2014-12-17 DIAGNOSIS — Z13820 Encounter for screening for osteoporosis: Secondary | ICD-10-CM

## 2015-01-06 ENCOUNTER — Ambulatory Visit (HOSPITAL_COMMUNITY): Payer: Medicare HMO

## 2015-01-07 ENCOUNTER — Ambulatory Visit (HOSPITAL_COMMUNITY)
Admission: RE | Admit: 2015-01-07 | Discharge: 2015-01-07 | Disposition: A | Discharge status: Home / Self Care | Payer: Medicare HMO | Source: Ambulatory Visit | Attending: Family Medicine | Admitting: Family Medicine

## 2015-01-07 ENCOUNTER — Ambulatory Visit (INDEPENDENT_AMBULATORY_CARE_PROVIDER_SITE_OTHER): Payer: Medicare HMO | Admitting: Women's Health

## 2015-01-07 ENCOUNTER — Encounter: Payer: Self-pay | Admitting: Women's Health

## 2015-01-07 VITALS — BP 110/80 | Ht 67.0 in | Wt 152.0 lb

## 2015-01-07 DIAGNOSIS — Z01419 Encounter for gynecological examination (general) (routine) without abnormal findings: Secondary | ICD-10-CM | POA: Diagnosis not present

## 2015-01-07 DIAGNOSIS — M8580 Other specified disorders of bone density and structure, unspecified site: Secondary | ICD-10-CM | POA: Diagnosis not present

## 2015-01-07 DIAGNOSIS — Z1231 Encounter for screening mammogram for malignant neoplasm of breast: Secondary | ICD-10-CM | POA: Diagnosis not present

## 2015-01-07 NOTE — Patient Instructions (Signed)
Health Recommendations for Postmenopausal Women Respected and ongoing research has looked at the most common causes of death, disability, and poor quality of life in postmenopausal women. The causes include heart disease, diseases of blood vessels, diabetes, depression, cancer, and bone loss (osteoporosis). Many things can be done to help lower the chances of developing these and other common problems. CARDIOVASCULAR DISEASE Heart Disease: A heart attack is a medical emergency. Know the signs and symptoms of a heart attack. Below are things women can do to reduce their risk for heart disease.   Do not smoke. If you smoke, quit.  Aim for a healthy weight. Being overweight causes many preventable deaths. Eat a healthy and balanced diet and drink an adequate amount of liquids.  Get moving. Make a commitment to be more physically active. Aim for 30 minutes of activity on most, if not all days of the week.  Eat for heart health. Choose a diet that is low in saturated fat and cholesterol and eliminate trans fat. Include whole grains, vegetables, and fruits. Read and understand the labels on food containers before buying.  Know your numbers. Ask your caregiver to check your blood pressure, cholesterol (total, HDL, LDL, triglycerides) and blood glucose. Work with your caregiver on improving your entire clinical picture.  High blood pressure. Limit or stop your table salt intake (try salt substitute and food seasonings). Avoid salty foods and drinks. Read labels on food containers before buying. Eating well and exercising can help control high blood pressure. STROKE  Stroke is a medical emergency. Stroke may be the result of a blood clot in a blood vessel in the brain or by a brain hemorrhage (bleeding). Know the signs and symptoms of a stroke. To lower the risk of developing a stroke:  Avoid fatty foods.  Quit smoking.  Control your diabetes, blood pressure, and irregular heart rate. THROMBOPHLEBITIS  (BLOOD CLOT) OF THE LEG  Becoming overweight and leading a stationary lifestyle may also contribute to developing blood clots. Controlling your diet and exercising will help lower the risk of developing blood clots. CANCER SCREENING  Breast Cancer: Take steps to reduce your risk of breast cancer.  You should practice "breast self-awareness." This means understanding the normal appearance and feel of your breasts and should include breast self-examination. Any changes detected, no matter how small, should be reported to your caregiver.  After age 4, you should have a clinical breast exam (CBE) every year.  Starting at age 48, you should consider having a mammogram (breast X-ray) every year.  If you have a family history of breast cancer, talk to your caregiver about genetic screening.  If you are at high risk for breast cancer, talk to your caregiver about having an MRI and a mammogram every year.  Intestinal or Stomach Cancer: Tests to consider are a rectal exam, fecal occult blood, sigmoidoscopy, and colonoscopy. Women who are high risk may need to be screened at an earlier age and more often.  Cervical Cancer:  Beginning at age 72, you should have a Pap test every 3 years as long as the past 3 Pap tests have been normal.  If you have had past treatment for cervical cancer or a condition that could lead to cancer, you need Pap tests and screening for cancer for at least 20 years after your treatment.  If you had a hysterectomy for a problem that was not cancer or a condition that could lead to cancer, then you no longer need Pap tests.  If you are between ages 65 and 70, and you have had normal Pap tests going back 10 years, you no longer need Pap tests.  If Pap tests have been discontinued, risk factors (such as a new sexual partner) need to be reassessed to determine if screening should be resumed.  Some medical problems can increase the chance of getting cervical cancer. In these  cases, your caregiver may recommend more frequent screening and Pap tests.  Uterine Cancer: If you have vaginal bleeding after reaching menopause, you should notify your caregiver.  Ovarian Cancer: Other than yearly pelvic exams, there are no reliable tests available to screen for ovarian cancer at this time except for yearly pelvic exams.  Lung Cancer: Yearly chest X-rays can detect lung cancer and should be done on high risk women, such as cigarette smokers and women with chronic lung disease (emphysema).  Skin Cancer: A complete body skin exam should be done at your yearly examination. Avoid overexposure to the sun and ultraviolet light lamps. Use a strong sun block cream when in the sun. All of these things are important for lowering the risk of skin cancer. MENOPAUSE Menopause Symptoms: Hormone therapy products are effective for treating symptoms associated with menopause:  Moderate to severe hot flashes.  Night sweats.  Mood swings.  Headaches.  Tiredness.  Loss of sex drive.  Insomnia.  Other symptoms. Hormone replacement carries certain risks, especially in older women. Women who use or are thinking about using estrogen or estrogen with progestin treatments should discuss that with their caregiver. Your caregiver will help you understand the benefits and risks. The ideal dose of hormone replacement therapy is not known. The Food and Drug Administration (FDA) has concluded that hormone therapy should be used only at the lowest doses and for the shortest amount of time to reach treatment goals.  OSTEOPOROSIS Protecting Against Bone Loss and Preventing Fracture If you use hormone therapy for prevention of bone loss (osteoporosis), the risks for bone loss must outweigh the risk of the therapy. Ask your caregiver about other medications known to be safe and effective for preventing bone loss and fractures. To guard against bone loss or fractures, the following is recommended:  If  you are younger than age 50, take 1000 mg of calcium and at least 600 mg of Vitamin D per day.  If you are older than age 50 but younger than age 70, take 1200 mg of calcium and at least 600 mg of Vitamin D per day.  If you are older than age 70, take 1200 mg of calcium and at least 800 mg of Vitamin D per day. Smoking and excessive alcohol intake increases the risk of osteoporosis. Eat foods rich in calcium and vitamin D and do weight bearing exercises several times a week as your caregiver suggests. DIABETES Diabetes Mellitus: If you have type I or type 2 diabetes, you should keep your blood sugar under control with diet, exercise, and recommended medication. Avoid starchy and fatty foods, and too many sweets. Being overweight can make diabetes control more difficult. COGNITION AND MEMORY Cognition and Memory: Menopausal hormone therapy is not recommended for the prevention of cognitive disorders such as Alzheimer's disease or memory loss.  DEPRESSION  Depression may occur at any age, but it is common in elderly women. This may be because of physical, medical, social (loneliness), or financial problems and needs. If you are experiencing depression because of medical problems and control of symptoms, talk to your caregiver about this. Physical   activity and exercise may help with mood and sleep. Community and volunteer involvement may improve your sense of value and worth. If you have depression and you feel that the problem is getting worse or becoming severe, talk to your caregiver about which treatment options are best for you. ACCIDENTS  Accidents are common and can be serious in elderly woman. Prepare your house to prevent accidents. Eliminate throw rugs, place hand bars in bath, shower, and toilet areas. Avoid wearing high heeled shoes or walking on wet, snowy, and icy areas. Limit or stop driving if you have vision or hearing problems, or if you feel you are unsteady with your movements and  reflexes. HEPATITIS C Hepatitis C is a type of viral infection affecting the liver. It is spread mainly through contact with blood from an infected person. It can be treated, but if left untreated, it can lead to severe liver damage over the years. Many people who are infected do not know that the virus is in their blood. If you are a "baby-boomer", it is recommended that you have one screening test for Hepatitis C. IMMUNIZATIONS  Several immunizations are important to consider having during your senior years, including:   Tetanus, diphtheria, and pertussis booster shot.  Influenza every year before the flu season begins.  Pneumonia vaccine.  Shingles vaccine.  Others, as indicated based on your specific needs. Talk to your caregiver about these. Document Released: 11/19/2005 Document Revised: 02/11/2014 Document Reviewed: 07/15/2008 Exit[employer] Patient Information 2015 [employer]. This information is not intended to replace advice given to you by your health care provider. Make sure you discuss any questions you have with your health care provider. Exercise to Stay Healthy Exercise helps you become and stay healthy. EXERCISE IDEAS AND TIPS Choose exercises that:  You enjoy.  Fit into your day. You do not need to exercise really hard to be healthy. You can do exercises at a slow or medium level and stay healthy. You can:  Stretch before and after working out.  Try yoga, Pilates, or tai chi.  Lift weights.  Walk fast, swim, jog, run, climb stairs, bicycle, dance, or rollerskate.  Take aerobic classes. Exercises that burn about 150 calories:  Running 1  miles in 15 minutes.  Playing volleyball for 45 to 60 minutes.  Washing and waxing a car for 45 to 60 minutes.  Playing touch football for 45 minutes.  Walking 1  miles in 35 minutes.  Pushing a stroller 1  miles in 30 minutes.  Playing basketball for 30 minutes.  Raking leaves for 30 minutes.  Bicycling 5  miles in 30 minutes.  Walking 2 miles in 30 minutes.  Dancing for 30 minutes.  Shoveling snow for 15 minutes.  Swimming laps for 20 minutes.  Walking up stairs for 15 minutes.  Bicycling 4 miles in 15 minutes.  Gardening for 30 to 45 minutes.  Jumping rope for 15 minutes.  Washing windows or floors for 45 to 60 minutes. Document Released: 10/30/2010 Document Revised: 12/20/2011 Document Reviewed: 10/30/2010 Exit[employer] Patient Information 2015 [employer]. This information is not intended to replace advice given to you by your health care provider. Make sure you discuss any questions you have with your health care provider.  

## 2015-01-07 NOTE — Progress Notes (Signed)
Megan Reilly 10/12/1940 [id number]    History:    Presents for annual exam.  Postmenopausal/no HRT. BSO for benign cysts. Normal Pap and mammogram history. 2006 negative colonoscopy planning to repeat summer 2016. 2014 Osteopenia T score -1. 6 at femoral neck, -1.6 forearm, FRAX 9.5%/1.5%. Normal vitamin D level. Vaccines current.   Past medical history, past surgical history, family history and social history were all reviewed and documented in the EPIC chart. 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 4 sons and 1 daughter all healthy.  ROS:  A ROS was performed and pertinent positives and negatives are included.  Exam:  Filed Vitals:   01/07/15 0852  BP: 110/80    General appearance:  Normal Thyroid:  Symmetrical, normal in size, without palpable masses or nodularity. Respiratory  Auscultation:  Clear without wheezing or rhonchi Cardiovascular  Auscultation:  Regular rate, without rubs, murmurs or gallops  Edema/varicosities:  Not grossly evident Abdominal  Soft,nontender, without masses, guarding or rebound.  Liver/spleen:  No organomegaly noted  Hernia:  None appreciated  Skin  Inspection:  Grossly normal   Breasts: Examined lying and sitting.     Right: Without masses, retractions, discharge or axillary adenopathy.     Left: Without masses, retractions, discharge or axillary adenopathy. Gentitourinary   Inguinal/mons:  Normal without inguinal adenopathy  External genitalia:  Normal  BUS/Urethra/Skene's glands:  Normal  Vagina:  Normal  Cervix:  Normal  Uterus:   normal in size, shape and contour.  Midline and mobile  Adnexa/parametria:     Rt: Without masses or tenderness.   Lt: Without masses or tenderness.  Anus and perineum: Normal  Digital rectal exam: Normal sphincter tone without palpated masses or tenderness  Assessment/Plan:  74 y.o.DWF G6P5  for annual exam with no complaints.  Postmenopausal/BSO/no bleeding/no HRT/not sexually active Normal  Pap and mammogram history Osteopenia without elevated FRAX  Plan: Keep scheduled DEXA appointment. Home safety, fall prevention and importance of regular weightbearing exercise reviewed. SBE's, continue annual screening mammogram, calcium rich diet, vitamin D 1000 daily encouraged. Repeat colonoscopy summer 2016. UA, Pap not done, screening guidelines reviewed.  Huel Cote WHNP, 1:03 PM 01/07/2015

## 2015-01-08 LAB — URINALYSIS W MICROSCOPIC + REFLEX CULTURE
Bacteria, UA: NONE SEEN
Bilirubin Urine: NEGATIVE
Casts: NONE SEEN
Crystals: NONE SEEN
Glucose, UA: NEGATIVE mg/dL
Hgb urine dipstick: NEGATIVE
KETONES UR: NEGATIVE mg/dL
Leukocytes, UA: NEGATIVE
Nitrite: NEGATIVE
Protein, ur: NEGATIVE mg/dL
Specific Gravity, Urine: <1.005 — ABNORMAL LOW (ref 1.005–1.030)
Squamous Epithelial / LPF: NONE SEEN
Urobilinogen, UA: 0.2 mg/dL (ref 0.0–1.0)
pH: 7.5 (ref 5.0–8.0)

## 2015-01-13 ENCOUNTER — Encounter: Payer: Self-pay | Admitting: Internal Medicine

## 2015-01-20 ENCOUNTER — Encounter: Payer: Medicare HMO | Admitting: Internal Medicine

## 2015-02-04 ENCOUNTER — Encounter (HOSPITAL_COMMUNITY): Payer: Self-pay | Admitting: *Deleted

## 2015-02-04 ENCOUNTER — Emergency Department (HOSPITAL_COMMUNITY)
Admission: EM | Admit: 2015-02-04 | Discharge: 2015-02-04 | Disposition: A | Discharge status: Home / Self Care | Payer: Medicare HMO | Attending: Emergency Medicine | Admitting: Emergency Medicine

## 2015-02-04 DIAGNOSIS — Y9289 Other specified places as the place of occurrence of the external cause: Secondary | ICD-10-CM | POA: Insufficient documentation

## 2015-02-04 DIAGNOSIS — S0990XA Unspecified injury of head, initial encounter: Secondary | ICD-10-CM

## 2015-02-04 DIAGNOSIS — Y9301 Activity, walking, marching and hiking: Secondary | ICD-10-CM | POA: Diagnosis not present

## 2015-02-04 DIAGNOSIS — S0101XA Laceration without foreign body of scalp, initial encounter: Secondary | ICD-10-CM | POA: Insufficient documentation

## 2015-02-04 DIAGNOSIS — M1990 Unspecified osteoarthritis, unspecified site: Secondary | ICD-10-CM | POA: Insufficient documentation

## 2015-02-04 DIAGNOSIS — R55 Syncope and collapse: Secondary | ICD-10-CM

## 2015-02-04 DIAGNOSIS — Z79899 Other long term (current) drug therapy: Secondary | ICD-10-CM | POA: Insufficient documentation

## 2015-02-04 DIAGNOSIS — W01198A Fall on same level from slipping, tripping and stumbling with subsequent striking against other object, initial encounter: Secondary | ICD-10-CM | POA: Diagnosis not present

## 2015-02-04 DIAGNOSIS — Y998 Other external cause status: Secondary | ICD-10-CM | POA: Insufficient documentation

## 2015-02-04 LAB — CBC WITH DIFFERENTIAL/PLATELET
BASOS ABS: 0 10*3/uL (ref 0.0–0.1)
Basophils Relative: 0 % (ref 0–1)
EOS PCT: 0 % (ref 0–5)
Eosinophils Absolute: 0 10*3/uL (ref 0.0–0.7)
HCT: 40.5 % (ref 36.0–46.0)
Hemoglobin: 13.4 g/dL (ref 12.0–15.0)
LYMPHS ABS: 1.2 10*3/uL (ref 0.7–4.0)
Lymphocytes Relative: 10 % — ABNORMAL LOW (ref 12–46)
MCH: 32.2 pg (ref 26.0–34.0)
MCHC: 33.1 g/dL (ref 30.0–36.0)
MCV: 97.4 fL (ref 78.0–100.0)
MONOS PCT: 5 % (ref 3–12)
Monocytes Absolute: 0.6 10*3/uL (ref 0.1–1.0)
Neutro Abs: 10.6 10*3/uL — ABNORMAL HIGH (ref 1.7–7.7)
Neutrophils Relative %: 85 % — ABNORMAL HIGH (ref 43–77)
Platelets: 205 10*3/uL (ref 150–400)
RBC: 4.16 MIL/uL (ref 3.87–5.11)
RDW: 12.3 % (ref 11.5–15.5)
WBC: 12.4 10*3/uL — AB (ref 4.0–10.5)

## 2015-02-04 LAB — BASIC METABOLIC PANEL
Anion gap: 8 (ref 5–15)
BUN: 16 mg/dL (ref 6–23)
CALCIUM: 9.9 mg/dL (ref 8.4–10.5)
CO2: 25 mmol/L (ref 19–32)
Chloride: 109 mmol/L (ref 96–112)
Creatinine, Ser: 0.66 mg/dL (ref 0.50–1.10)
GFR calc Af Amer: >90 mL/min (ref >90)
GFR calc non Af Amer: 85 mL/min — ABNORMAL LOW (ref >90)
Glucose, Bld: 108 mg/dL — ABNORMAL HIGH (ref 70–99)
POTASSIUM: 3.7 mmol/L (ref 3.5–5.1)
SODIUM: 142 mmol/L (ref 135–145)

## 2015-02-04 LAB — CBG MONITORING, ED: Glucose-Capillary: 110 mg/dL — ABNORMAL HIGH (ref 70–99)

## 2015-02-04 MED ORDER — LIDOCAINE-EPINEPHRINE 2 %-1:100000 IJ SOLN
20.0000 mL | Freq: Once | INTRAMUSCULAR | Status: DC
  Filled 2015-02-04: qty 1

## 2015-02-04 NOTE — ED Provider Notes (Signed)
CSN: [id number]     Arrival date & time 02/04/15  1153 History   First MD Initiated Contact with Patient 02/04/15 1514     Chief Complaint  Patient presents with  . Fall     (Consider location/radiation/quality/duration/timing/severity/associated sxs/prior Treatment) HPI   PCP: Eulas Post, MD Blood pressure 151/63, pulse 68, temperature 97.4 F (36.3 C), temperature source Oral, resp. rate 12, SpO2 100 %.  Megan Reilly is a 74 y.o.female with a significant PMH of arthritis, allergy presents to the ER with complaints of near syncopal episode. She reports being in the cancer center gardens because her friend group is donating to help improve the garden and became very hot. The weather is very warm and humid today. She was standing in the sun for a prolonged period oftime when she started to feel very hot and flushed. She told her friends she needed to sit down but before she could she fell backwards and hit her head on the wood bench. Denies LOC, she has a laceration to posterior scalp. She reports hx of vasovagal episodes that her PCP is aware of. She felt fine this morning and reports feeling great now. The patient irritable due to waiting times. She reports not wanting to go to the ER and wanted to see her PCP but her friends refused. Patient would like to leave but was told her insurance would not pay therefore she had decided to wait. Patient reports wanting her laceration fixed and then to go home.  Negative Review of Symptoms: weakness, dysphagia, ataxia, syncope, CP, SOB, abdominal pain, aphagia, neck pain, headache, N/V/D, palpitations. Denies any other injuries.   Past Medical History  Diagnosis Date  . Arthritis   . Allergy   . Blood transfusion abn reaction or complication, no procedure mishap     74 years old after tonsilectomy   Past Surgical History  Procedure Laterality Date  . Tonsillectomy  1945  . Abdominal hysterectomy      partial 1983  . Foot surgery     bunectomy, hammertoe, bil   Family History  Problem Relation Age of Onset  . Heart disease Mother   . Hyperlipidemia Mother   . Stroke Mother   . Diabetes Father     type ll  . Heart disease Father 46    sudden death age 44  . Hyperlipidemia Father    History  Substance Use Topics  . Smoking status: Never Smoker   . Smokeless tobacco: Never Used  . Alcohol Use: 0.0 oz/week    0 Standard drinks or equivalent per week     Comment: glass of wine 3-4 times a week   OB History    Gravida Para Term Preterm AB TAB SAB Ectopic Multiple Living   6 5   1  1   5      Review of Systems  10 Systems reviewed and are negative for acute change except as noted in the HPI.    Allergies  Review of patient's allergies indicates no known allergies.  Home Medications   Prior to Admission medications   Medication Sig Start Date End Date Taking? Authorizing Provider  acetaminophen (TYLENOL) 325 MG tablet Take 2 tablets (650 mg total) by mouth every 6 (six) hours as needed for mild pain (or Fever >/= 101). Patient taking differently: Take 325-650 mg by mouth every 6 (six) hours as needed for mild pain (or Fever >/= 101).  12/11/13  Yes Donzetta Starch, NP  calcium carbonate (OS-CAL)  600 MG TABS Take 600 mg by mouth 2 (two) times daily with a meal.     Yes Historical Provider, MD  Chlorpheniramine Maleate (CHLOR-TRIMETON ALLERGY PO) Take 1 tablet by mouth daily as needed (allergy).   Yes Historical Provider, MD  fish oil-omega-3 fatty acids 1000 MG capsule Take 1 g by mouth daily.    Yes Historical Provider, MD  Magnesium 250 MG TABS Take 1 tablet by mouth daily.   Yes Historical Provider, MD  Multiple Vitamin (MULTIVITAMIN WITH MINERALS) TABS tablet Take 1 tablet by mouth every morning.   Yes Historical Provider, MD  azelastine (ASTELIN) 137 MCG/SPRAY nasal spray Place 2 sprays into the nose 2 (two) times daily. Use in each nostril as directed Patient not taking: Reported on 02/04/2015 09/21/12  10/27/15  Eulas Post, MD  docusate sodium 100 MG CAPS Take 100 mg by mouth 2 (two) times daily. Patient not taking: Reported on 02/04/2015 12/11/13   Donzetta Starch, NP   BP 142/64 mmHg  Pulse 68  Temp(Src) 97.4 F (36.3 C) (Oral)  Resp 15  SpO2 99% Physical Exam  Constitutional: She appears well-developed and well-nourished. No distress.  HENT:  Head: Normocephalic. Head is with contusion and with laceration (4 cm laceration to posterior scalp, not actively bleeding). Head is without raccoon's eyes, without Battle's sign and without abrasion. Hair is normal.    Right Ear: Tympanic membrane normal.  Left Ear: Tympanic membrane normal.  Nose: Nose normal.  Eyes: Pupils are equal, round, and reactive to light.  Neck: Normal range of motion. Neck supple. No spinous process tenderness and no muscular tenderness present.  Cardiovascular: Normal rate and regular rhythm.   Pulmonary/Chest: Effort normal and breath sounds normal. She exhibits no tenderness, no bony tenderness, no crepitus and no retraction.  Abdominal: Soft. There is no tenderness.  Musculoskeletal:  Pt has FROM of all 4 extremities with no pain.  Neurological: She is alert.  Cranial nerves II-VIII and X-XII evaluated and show no deficits. Pt alert and oriented x 3 Upper and lower extremity strength is symmetrical and physiologic Normal muscular tone No facial droop Coordination intact No pronator drift  Skin: Skin is warm and dry. No rash noted.  Nursing note and vitals reviewed.   ED Course  Procedures (including critical care time) Labs Review Labs Reviewed  CBC WITH DIFFERENTIAL/PLATELET - Abnormal; Notable for the following:    WBC 12.4 (*)    Neutrophils Relative % 85 (*)    Neutro Abs 10.6 (*)    Lymphocytes Relative 10 (*)    All other components within normal limits  CBG MONITORING, ED - Abnormal; Notable for the following:    Glucose-Capillary 110 (*)    All other components within normal limits    BASIC METABOLIC PANEL    Imaging Review No results found.   EKG Interpretation   Date/Time:  Tuesday February 04 2015 15:32:26 EDT Ventricular Rate:  68 PR Interval:  124 QRS Duration: 98 QT Interval:  454 QTC Calculation: 483 R Axis:   54 Text Interpretation:  Sinus rhythm Baseline wander in lead(s) V5 since  last tracing no significant change Confirmed by Eulis Foster  MD, Vira Agar ([id number])  on 02/04/2015 3:47:06 PM      MDM   Final diagnoses:  Vasovagal episode  Head injury, initial encounter  Laceration of scalp, initial encounter   Dr. Eulis Foster saw patient and repaired the laceration. Dr. Eulis Foster discharged patient due to patient wanting to leave and not wanting to  wait for the rest of her labs. EKG and CBC unremarkable. BMP is pending.    74 y.o.Razan A Scarboro's evaluation in the Emergency Department is complete. It has been determined that no acute conditions requiring further emergency intervention are present at this time. The patient/guardian have been advised of the diagnosis and plan. We have discussed signs and symptoms that warrant return to the ED, such as changes or worsening in symptoms.  Vital signs are stable at discharge. Filed Vitals:   02/04/15 1614  BP: 142/64  Pulse: 68  Temp:   Resp: 15    Patient/guardian has voiced understanding and agreed to follow-up with the PCP or specialist.   Delos Haring, PA-C 02/04/15 [place], MD 02/05/15 0104

## 2015-02-04 NOTE — Discharge Instructions (Signed)
Keep the wound clean with soap and water daily. Take Tylenol or Motrin for pain. Return here if you develop problems that are concerning for worsening head injury.   Head Injury You have a head injury. Headaches and throwing up (vomiting) are common after a head injury. It should be easy to wake up from sleeping. Sometimes you must stay in the hospital. Most problems happen within the first 24 hours. Side effects may occur up to 7-10 days after the injury.  WHAT ARE THE TYPES OF HEAD INJURIES? Head injuries can be as minor as a bump. Some head injuries can be more severe. More severe head injuries include:  A jarring injury to the brain (concussion).  A bruise of the brain (contusion). This mean there is bleeding in the brain that can cause swelling.  A cracked skull (skull fracture).  Bleeding in the brain that collects, clots, and forms a bump (hematoma). WHEN SHOULD I GET HELP RIGHT AWAY?   You are confused or sleepy.  You cannot be woken up.  You feel sick to your stomach (nauseous) or keep throwing up (vomiting).  Your dizziness or unsteadiness is getting worse.  You have very bad, lasting headaches that are not helped by medicine. Take medicines only as told by your doctor.  You cannot use your arms or legs like normal.  You cannot walk.  You notice changes in the black spots in the center of the colored part of your eye (pupil).  You have clear or bloody fluid coming from your nose or ears.  You have trouble seeing. During the next 24 hours after the injury, you must stay with someone who can watch you. This person should get help right away (call 911 in the [place] if you start to shake and are not able to control it (have seizures), you pass out, or you are unable to wake up. HOW CAN I PREVENT A HEAD INJURY IN THE FUTURE?  Wear seat belts.  Wear a helmet while bike riding and playing sports like football.  Stay away from dangerous activities around the house. WHEN  CAN I RETURN TO NORMAL ACTIVITIES AND ATHLETICS? See your doctor before doing these activities. You should not do normal activities or play contact sports until 1 week after the following symptoms have stopped:  Headache that does not go away.  Dizziness.  Poor attention.  Confusion.  Memory problems.  Sickness to your stomach or throwing up.  Tiredness.  Fussiness.  Bothered by bright lights or loud noises.  Anxiousness or depression.  Restless sleep. MAKE SURE YOU:   Understand these instructions.  Will watch your condition.  Will get help right away if you are not doing well or get worse. Document Released: 09/09/2008 Document Revised: 02/11/2014 Document Reviewed: 06/04/2013 [facility] Patient Information 2015 [place], [place]. This information is not intended to replace advice given to you by your health care provider. Make sure you discuss any questions you have with your health care provider.  Stitches, Staples, or Skin Adhesive Strips  Stitches (sutures), staples, and skin adhesive strips hold the skin together as it heals. They will usually be in place for 7 days or less. HOME CARE  Wash your hands with soap and water before and after you touch your wound.  Only take medicine as told by your doctor.  Cover your wound only if your doctor told you to. Otherwise, leave it open to air.  Do not get your stitches wet or dirty. If they get dirty,  dab them gently with a clean washcloth. Wet the washcloth with soapy water. Do not rub. Pat them dry gently.  Do not put medicine or medicated cream on your stitches unless your doctor told you to.  Do not take out your own stitches or staples. Skin adhesive strips will fall off by themselves.  Do not pick at the wound. Picking can cause an infection.  Do not miss your follow-up appointment.  If you have problems or questions, call your doctor. GET HELP RIGHT AWAY IF:   You have a temperature by mouth above 102 F  (38.9 C), not controlled by medicine.  You have chills.  You have redness or pain around your stitches.  There is puffiness (swelling) around your stitches.  You notice fluid (drainage) from your stitches.  There is a bad smell coming from your wound. MAKE SURE YOU:  Understand these instructions.  Will watch your condition.  Will get help if you are not doing well or get worse. Document Released: 07/25/2009 Document Revised: 12/20/2011 Document Reviewed: 07/25/2009 [facility] Patient Information 2015 [place], [place]. This information is not intended to replace advice given to you by your health care provider. Make sure you discuss any questions you have with your health care provider.

## 2015-02-04 NOTE — ED Provider Notes (Signed)
  Face-to-face evaluation   History: Patient was walking outside, in the heat, when she felt overheated and dizzy. She fell striking the back of her head. She believes she awoke immediately when her head hit the ground. She was with other people who helped her up and into a wheelchair, then brought her here. She denies headache, neck pain or back pain at this time. There are no other preceding symptoms. There is no prolonged loss of consciousness, or seizure activity.  Physical exam: Alert, elderly female, who is calm, comfortable. Head normocephalic. No crepitation of the scalp. Mid occiput laceration, superficial but gaping, 4.5 cm in length. No active bleeding, or drainage of fluid.  LACERATION REPAIR Performed by: Richarda Blade Consent: Verbal consent obtained. Risks and benefits: risks, benefits and alternatives were discussed Patient identity confirmed: provided demographic data Time out performed prior to procedure Prepped and Draped in normal sterile fashion Wound explored Laceration Location: Occiput scalp Laceration Length: 4.5 cm No Foreign Bodies seen or palpated  Irrigation method: Gauze with saline  Amount of cleaning: standard Skin closure: Regular staples  Number of sutures or staples: 3  Patient tolerance: Patient tolerated the procedure well with no immediate complications.  Nursing Notes Reviewed/ Care Coordinated Applicable Imaging Reviewed Interpretation of Laboratory Data incorporated into ED treatment  The patient appears reasonably screened and/or stabilized for discharge and I doubt any other medical condition or other [facility] requiring further screening, evaluation, or treatment in the ED at this time prior to discharge.  Plan: Home Medications- OTC analgesia; Home Treatments- wound care, observe at home, fried to stay tonight; return here if the recommended treatment, does not improve the symptoms; Recommended follow up- PCP 7 days for staple  removal    Medical screening examination/treatment/procedure(s) were conducted as a shared visit with non-physician practitioner(s) and myself.  I personally evaluated the patient during the encounter  Daleen Bo, MD 02/05/15 [phone]

## 2015-02-04 NOTE — ED Notes (Signed)
[facility] with a group of ladies and suddenly began to feel hot. Patient denies any other symptoms. According to witness, the patient's eyes were fixed prior to her falling straight back from a falling position hitting her head on the concrete. Patient states that she had one episode about a year ago and was seen by her primary then that told her that she has ":low blood pressure". Patient states she felt great this morning and was able to go for her morning walk with no problems. Patient has a laceration to the back of her head with bleeding controlled. Patient is alert and oriented.

## 2015-02-05 ENCOUNTER — Telehealth: Payer: Self-pay | Admitting: Family Medicine

## 2015-02-05 NOTE — Telephone Encounter (Signed)
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 Pt was weak. Pt wanted to go to see her pcp dr Elease Hashimoto, but they insisted she go to the ED there. Pt wanted you to know she was there until 4:30 pm and they were not even that busy.  Pt felt like someone should know. Pt very unhappy w/ out ed system

## 2015-02-05 NOTE — Telephone Encounter (Signed)
FYI: pt was seen on 02/04/15 notes are in patient chart.

## 2015-02-12 ENCOUNTER — Encounter: Payer: Self-pay | Admitting: Family Medicine

## 2015-02-12 ENCOUNTER — Ambulatory Visit (INDEPENDENT_AMBULATORY_CARE_PROVIDER_SITE_OTHER): Payer: Medicare HMO | Admitting: Family Medicine

## 2015-02-12 VITALS — BP 132/70 | HR 60 | Temp 98.4°F | Wt 151.0 lb

## 2015-02-12 DIAGNOSIS — S0101XD Laceration without foreign body of scalp, subsequent encounter: Secondary | ICD-10-CM

## 2015-02-12 DIAGNOSIS — R55 Syncope and collapse: Secondary | ICD-10-CM

## 2015-02-12 NOTE — Patient Instructions (Signed)
Vasovagal Syncope, Adult Syncope, commonly known as fainting, is a temporary loss of consciousness. It occurs when the blood flow to the brain is reduced. Vasovagal syncope (also called neurocardiogenic syncope) is a fainting spell in which the blood flow to the brain is reduced because of a sudden drop in heart rate and blood pressure. Vasovagal syncope occurs when the brain and the cardiovascular system (blood vessels) do not adequately communicate and respond to each other. This is the most common cause of fainting. It often occurs in response to fear or some other type of emotional or physical stress. The body has a reaction in which the heart starts beating too slowly or the blood vessels expand, reducing blood pressure. This type of fainting spell is generally considered harmless. However, injuries can occur if a person takes a sudden fall during a fainting spell.  CAUSES  Vasovagal syncope occurs when a person's blood pressure and heart rate decrease suddenly, usually in response to a trigger. Many things and situations can trigger an episode. Some of these include:   Pain.   Fear.   The sight of blood or medical procedures, such as blood being drawn from a vein.   Common activities, such as coughing, swallowing, stretching, or going to the bathroom.   Emotional stress.   Prolonged standing, especially in a warm environment.   Lack of sleep or rest.   Prolonged lack of food.   Prolonged lack of fluids.   Recent illness.  The use of certain drugs that affect blood pressure, such as cocaine, alcohol, marijuana, inhalants, and opiates.  SYMPTOMS  Before the fainting episode, you may:   Feel dizzy or light headed.   Become pale.  Sense that you are going to faint.   Feel like the room is spinning.   Have tunnel vision, only seeing directly in front of you.   Feel sick to your stomach (nauseous).   See spots or slowly lose vision.   Hear ringing in your  ears.   Have a headache.   Feel warm and sweaty.   Feel a sensation of pins and needles. During the fainting spell, you will generally be unconscious for no longer than a couple minutes before waking up and returning to normal. If you get up too quickly before your body can recover, you may faint again. Some twitching or jerky movements may occur during the fainting spell.  DIAGNOSIS  Your caregiver will ask about your symptoms, take a medical history, and perform a physical exam. Various tests may be done to rule out other causes of fainting. These may include blood tests and tests to check the heart, such as electrocardiography, echocardiography, and possibly an electrophysiology study. When other causes have been ruled out, a test may be done to check the body's response to changes in position (tilt table test). TREATMENT  Most cases of vasovagal syncope do not require treatment. Your caregiver may recommend ways to avoid fainting triggers and may provide home strategies for preventing fainting. If you must be exposed to a possible trigger, you can drink additional fluids to help reduce your chances of having an episode of vasovagal syncope. If you have warning signs of an oncoming episode, you can respond by positioning yourself favorably (lying down). If your fainting spells continue, you may be given medicines to prevent fainting. Some medicines may help make you more resistant to repeated episodes of vasovagal syncope. Special exercises or compression stockings may be recommended. In rare cases, the surgical placement  of a pacemaker is considered. HOME CARE INSTRUCTIONS   Learn to identify the warning signs of vasovagal syncope.   Sit or lie down at the first warning sign of a fainting spell. If sitting, put your head down between your legs. If you lie down, swing your legs up in the air to increase blood flow to the brain.   Avoid hot tubs and saunas.  Avoid prolonged  standing.  Drink enough fluids to keep your urine clear or pale yellow. Avoid caffeine.  Increase salt in your diet as directed by your caregiver.   If you have to stand for a long time, perform movements such as:   Crossing your legs.   Flexing and stretching your leg muscles.   Squatting.   Moving your legs.   Bending over.   Only take over-the-counter or prescription medicines as directed by your caregiver. Do not suddenly stop any medicines without asking your caregiver first. SEEK MEDICAL CARE IF:   Your fainting spells continue or happen more frequently in spite of treatment.   You lose consciousness for more than a couple minutes.  You have fainting spells during or after exercising or after being startled.   You have new symptoms that occur with the fainting spells, such as:   Shortness of breath.  Chest pain.   Irregular heartbeat.   You have episodes of twitching or jerky movements that last longer than a few seconds.  You have episodes of twitching or jerky movements without obvious fainting. SEEK IMMEDIATE MEDICAL CARE IF:   You have injuries or bleeding after a fainting spell.   You have episodes of twitching or jerky movements that last longer than 5 minutes.   You have more than one spell of twitching or jerky movements before returning to consciousness after fainting. MAKE SURE YOU:   Understand these instructions.  Will watch your condition.  Will get help right away if you are not doing well or get worse. Document Released: 09/13/2012 Document Reviewed: 09/13/2012 [facility] Patient Information 2015 [place]. This information is not intended to replace advice given to you by your health care provider. Make sure you discuss any questions you have with your health care provider.  Avoid Benadryl and Chlorpheniramine as much as possible as these can contribute to lowering blood pressure.

## 2015-02-12 NOTE — Progress Notes (Signed)
Pre visit review using our clinic review tool, if applicable. No additional management support is needed unless otherwise documented below in the visit note. 

## 2015-02-12 NOTE — Progress Notes (Signed)
   Subjective:    Patient ID: Megan Reilly, female    DOB: [birth year], 74 y.o.   MRN: [id number]  HPI  Patient seen for follow-up regarding recent syncopal episode. She also had syncope episode a year ago and had hospitalization at time and full workup including EEG, echocardiogram, carotid Dopplers, heart monitoring. Was felt she had vasovagal episode. Current episode occurred last Tuesday. She was outside in the heat and states it was felt "muggy ". She recalls feeling somewhat lightheaded and denied any confusion, palpitations, or chest pain. She apparently fell backwards. It was not clear whether she lost consciousness. She hit her occipital scalp and had small laceration. Patient does recall that she had taken chlorpheniramine earlier that morning and with episode year ago had taken Benadryl the same day. She does not take any blood pressure medications nor any medications that would drop her blood pressure other than the over-the-counter chlorpheniramine.  Past Medical History  Diagnosis Date  . Arthritis   . Allergy   . Blood transfusion abn reaction or complication, no procedure mishap     74 years old after tonsilectomy   Past Surgical History  Procedure Laterality Date  . Tonsillectomy  1945  . Abdominal hysterectomy      partial 1983  . Foot surgery      bunectomy, hammertoe, bil    reports that she has never smoked. She has never used smokeless tobacco. She reports that she drinks alcohol. She reports that she does not use illicit drugs. family history includes Diabetes in her father; Heart disease in her mother; Heart disease (age of onset: 73) in her father; Hyperlipidemia in her father and mother; Stroke in her mother. No Known Allergies   Review of Systems  Constitutional: Negative for fatigue.  Eyes: Negative for visual disturbance.  Respiratory: Negative for cough, chest tightness, shortness of breath and wheezing.   Cardiovascular: Negative for chest pain,  palpitations and leg swelling.  Neurological: Negative for dizziness, seizures, syncope, weakness, light-headedness and headaches.       Objective:   Physical Exam  Constitutional: She is oriented to person, place, and time. She appears well-developed and well-nourished.  Neck: Neck supple. No thyromegaly present.  Cardiovascular: Normal rate and regular rhythm.  Exam reveals no gallop.   No murmur heard. Pulmonary/Chest: Effort normal and breath sounds normal. No respiratory distress. She has no wheezes. She has no rales.  Musculoskeletal: She exhibits no edema.  Neurological: She is alert and oriented to person, place, and time. No cranial nerve deficit. Coordination normal.  Full strength throughout.  Skin:  Healing laceration occipital scalp. 3 staples removed. No signs of secondary infection.  Psychiatric: She has a normal mood and affect. Her behavior is normal. Judgment and thought content normal.          Assessment & Plan:  #1 scalp laceration following recent syncopal episode. Staples removed.  Healing well with no infection. #2 recent syncope. This makes the second episode. Suspect vasovagal. We have strongly advise avoiding antihistamine such as Benadryl or chlorpheniramine which been taken on each occasion prior to her passing out. Stay well-hydrated. She is currently drinking one glass of Gatorade per day. We discussed other evaluation such as event monitoring but feel this would have low yield. There is no suspicion clinically of seizure activity. Previous EEG negative.  She has had fairly extensive evaluation of syncope as above in past.

## 2015-02-18 ENCOUNTER — Ambulatory Visit (AMBULATORY_SURGERY_CENTER): Payer: Self-pay

## 2015-02-18 ENCOUNTER — Ambulatory Visit (INDEPENDENT_AMBULATORY_CARE_PROVIDER_SITE_OTHER): Payer: Medicare HMO

## 2015-02-18 ENCOUNTER — Other Ambulatory Visit: Payer: Self-pay | Admitting: Gynecology

## 2015-02-18 VITALS — Ht 69.0 in | Wt 151.2 lb

## 2015-02-18 DIAGNOSIS — Z13820 Encounter for screening for osteoporosis: Secondary | ICD-10-CM

## 2015-02-18 DIAGNOSIS — Z1211 Encounter for screening for malignant neoplasm of colon: Secondary | ICD-10-CM

## 2015-02-18 DIAGNOSIS — M8580 Other specified disorders of bone density and structure, unspecified site: Secondary | ICD-10-CM | POA: Diagnosis not present

## 2015-02-18 NOTE — Progress Notes (Signed)
Per pt, no allergies to soy or egg products.Pt not taking any weight loss meds or using  O2 at home. 

## 2015-02-25 ENCOUNTER — Telehealth: Payer: Self-pay | Admitting: Family Medicine

## 2015-02-25 NOTE — Telephone Encounter (Signed)
Pt would like to know last time she had vit d level check. Pt needs callback after 4 pm

## 2015-02-26 NOTE — Telephone Encounter (Signed)
Pt informed. Per patient to fax lab results to Dr. Toney Rakes office. Lab results faxed to office.

## 2015-03-06 ENCOUNTER — Encounter: Payer: Self-pay | Admitting: Internal Medicine

## 2015-03-24 ENCOUNTER — Ambulatory Visit (AMBULATORY_SURGERY_CENTER): Payer: Medicare HMO | Admitting: Internal Medicine

## 2015-03-24 ENCOUNTER — Encounter: Payer: Self-pay | Admitting: Internal Medicine

## 2015-03-24 VITALS — BP 146/77 | HR 60 | Temp 97.1°F | Resp 15 | Ht 69.0 in | Wt 151.0 lb

## 2015-03-24 DIAGNOSIS — Z1211 Encounter for screening for malignant neoplasm of colon: Secondary | ICD-10-CM | POA: Diagnosis present

## 2015-03-24 MED ORDER — SODIUM CHLORIDE 0.9 % IV SOLN: 500.0000 mL | INTRAVENOUS | Status: DC

## 2015-03-24 NOTE — Progress Notes (Signed)
To recovery, report to Mirts, RN, VSS. 

## 2015-03-24 NOTE — Op Note (Signed)
[facility]  [address]. [place], [address]   COLONOSCOPY PROCEDURE REPORT  PATIENT: Megan Reilly, Megan Reilly  MR#: [id number] BIRTHDATE: 01-Oct-1941 , 74  yrs. old GENDER: female ENDOSCOPIST: Eustace Quail, MD REFERRED PN:PYYFR Burchette, M.D. PROCEDURE DATE:  03/24/2015 PROCEDURE:   Colonoscopy, screening First Screening Colonoscopy - Avg.  risk and is 50 yrs.  old or older - No.  Prior Negative Screening - Now for repeat screening. 10 or more years since last screening  History of Adenoma - Now for follow-up colonoscopy & has been > or = to 3 yrs.  N/A  Polyps removed today? No Recommend repeat exam, <10 yrs? No ASA CLASS:   Class II INDICATIONS:Screening for colonic neoplasia and Colorectal Neoplasm Risk Assessment for this procedure is average risk. Reports index examination 11 years ago, elsewhere, without polyps. MEDICATIONS: Monitored anesthesia care and Propofol 250 mg IV  DESCRIPTION OF PROCEDURE:   After the risks benefits and alternatives of the procedure were thoroughly explained, informed consent was obtained.  The digital rectal exam revealed no abnormalities of the rectum.   The LB [id number]  endoscope was introduced through the anus and advanced to the cecum, which was identified by both the appendix and ileocecal valve. No adverse events experienced.   The quality of the prep was excellent. (MiraLax was used)  The instrument was then slowly withdrawn as the colon was fully examined. Estimated blood loss is zero unless otherwise noted in this procedure report.     COLON FINDINGS: There was moderate diverticulosis noted in the left colon.   The examination was otherwise normal.  Retroflexed views revealed internal hemorrhoids. The time to cecum = 3.2 Withdrawal time = 11.2   The scope was withdrawn and the procedure completed. COMPLICATIONS: There were no immediate complications.  ENDOSCOPIC IMPRESSION: 1.   Moderate diverticulosis was  noted in the left colon 2.   The examination was otherwise normal  RECOMMENDATIONS: 1. Return to the care of your primary provider.  GI follow up as needed  eSigned:  Eustace Quail, MD 03/24/2015 9:18 AM   cc: Carolann Littler, MD and The Patient

## 2015-03-24 NOTE — Patient Instructions (Signed)
YOU HAD AN ENDOSCOPIC PROCEDURE TODAY AT [facility]:   Refer to the procedure report that was given to you for any specific questions about what was found during the examination.  If the procedure report does not answer your questions, please call your gastroenterologist to clarify.  If you requested that your care partner not be given the details of your procedure findings, then the procedure report has been included in a sealed envelope for you to review at your convenience later.  YOU SHOULD EXPECT: Some feelings of bloating in the abdomen. Passage of more gas than usual.  Walking can help get rid of the air that was put into your GI tract during the procedure and reduce the bloating. If you had a lower endoscopy (such as a colonoscopy or flexible sigmoidoscopy) you may notice spotting of blood in your stool or on the toilet paper. If you underwent a bowel prep for your procedure, you may not have a normal bowel movement for a few days.  Please Note:  You might notice some irritation and congestion in your nose or some drainage.  This is from the oxygen used during your procedure.  There is no need for concern and it should clear up in a day or so.  SYMPTOMS TO REPORT IMMEDIATELY:   Following lower endoscopy (colonoscopy or flexible sigmoidoscopy):  Excessive amounts of blood in the stool  Significant tenderness or worsening of abdominal pains  Swelling of the abdomen that is new, acute  Fever of 100F or higher  For urgent or emergent issues, a gastroenterologist can be reached at any hour by calling [phone].   DIET: Your first meal following the procedure should be a small meal and then it is ok to progress to your normal diet. Heavy or fried foods are harder to digest and may make you feel nauseous or bloated.  Likewise, meals heavy in dairy and vegetables can increase bloating.  Drink plenty of fluids but you should avoid alcoholic beverages for 24  hours.  ACTIVITY:  You should plan to take it easy for the rest of today and you should NOT DRIVE or use heavy machinery until tomorrow (because of the sedation medicines used during the test).    FOLLOW UP: Our staff will call the number listed on your records the next business day following your procedure to check on you and address any questions or concerns that you may have regarding the information given to you following your procedure. If we do not reach you, we will leave a message.  However, if you are feeling well and you are not experiencing any problems, there is no need to return our call.  We will assume that you have returned to your regular daily activities without incident.  If any biopsies were taken you will be contacted by phone or by letter within the next 1-3 weeks.  Please call us at [phone] if you have not heard about the biopsies in 3 weeks.    SIGNATURES/CONFIDENTIALITY: You and/or your care partner have signed paperwork which will be entered into your electronic medical record.  These signatures attest to the fact that that the information above on your After Visit Summary has been reviewed and is understood.  Full responsibility of the confidentiality of this discharge information lies with you and/or your care-partner.  Diverticulosis, high fiber diet-handouts given  Return to primary care, GI follow-up as needed.

## 2015-03-25 ENCOUNTER — Telehealth: Payer: Self-pay | Admitting: *Deleted

## 2015-03-25 NOTE — Telephone Encounter (Signed)
  Follow up Call-  Call back number 03/24/2015  Post procedure Call Back phone  # [phone]  Permission to leave phone message Yes     No answer,left message.

## 2015-07-24 ENCOUNTER — Ambulatory Visit (INDEPENDENT_AMBULATORY_CARE_PROVIDER_SITE_OTHER): Payer: Medicare HMO | Admitting: *Deleted

## 2015-07-24 DIAGNOSIS — Z23 Encounter for immunization: Secondary | ICD-10-CM

## 2015-07-25 ENCOUNTER — Ambulatory Visit: Payer: Medicare HMO | Admitting: *Deleted

## 2015-09-23 ENCOUNTER — Encounter: Payer: Self-pay | Admitting: Family Medicine

## 2015-09-23 ENCOUNTER — Ambulatory Visit (INDEPENDENT_AMBULATORY_CARE_PROVIDER_SITE_OTHER): Payer: Medicare HMO | Admitting: Family Medicine

## 2015-09-23 VITALS — BP 110/80 | HR 73 | Temp 98.1°F | Resp 16 | Ht 70.0 in | Wt 152.2 lb

## 2015-09-23 DIAGNOSIS — Z0000 Encounter for general adult medical examination without abnormal findings: Secondary | ICD-10-CM

## 2015-09-23 LAB — HEPATIC FUNCTION PANEL
ALBUMIN: 4.5 g/dL (ref 3.5–5.2)
ALT: 14 U/L (ref 0–35)
AST: 20 U/L (ref 0–37)
Alkaline Phosphatase: 72 U/L (ref 39–117)
BILIRUBIN TOTAL: 0.8 mg/dL (ref 0.2–1.2)
Bilirubin, Direct: 0.2 mg/dL (ref 0.0–0.3)
Total Protein: 6.2 g/dL (ref 6.0–8.3)

## 2015-09-23 LAB — CBC WITH DIFFERENTIAL/PLATELET
Basophils Absolute: 0 10*3/uL (ref 0.0–0.1)
Basophils Relative: 0.5 % (ref 0.0–3.0)
EOS ABS: 0.1 10*3/uL (ref 0.0–0.7)
Eosinophils Relative: 1.1 % (ref 0.0–5.0)
HCT: 42.2 % (ref 36.0–46.0)
Hemoglobin: 13.9 g/dL (ref 12.0–15.0)
LYMPHS ABS: 1.3 10*3/uL (ref 0.7–4.0)
Lymphocytes Relative: 17.1 % (ref 12.0–46.0)
MCHC: 32.9 g/dL (ref 30.0–36.0)
MCV: 97.6 fl (ref 78.0–100.0)
MONO ABS: 0.5 10*3/uL (ref 0.1–1.0)
Monocytes Relative: 6.1 % (ref 3.0–12.0)
NEUTROS ABS: 5.8 10*3/uL (ref 1.4–7.7)
Neutrophils Relative %: 75.2 % (ref 43.0–77.0)
PLATELETS: 198 10*3/uL (ref 150.0–400.0)
RBC: 4.32 Mil/uL (ref 3.87–5.11)
RDW: 12.8 % (ref 11.5–15.5)
WBC: 7.7 10*3/uL (ref 4.0–10.5)

## 2015-09-23 LAB — LIPID PANEL
CHOLESTEROL: 197 mg/dL (ref 0–200)
HDL: 63.3 mg/dL (ref >39.00)
LDL CALC: 111 mg/dL — AB (ref 0–99)
NonHDL: 133.27
TRIGLYCERIDES: 113 mg/dL (ref 0.0–149.0)
Total CHOL/HDL Ratio: 3
VLDL: 22.6 mg/dL (ref 0.0–40.0)

## 2015-09-23 LAB — BASIC METABOLIC PANEL
BUN: 13 mg/dL (ref 6–23)
CHLORIDE: 107 meq/L (ref 96–112)
CO2: 30 mEq/L (ref 19–32)
Calcium: 10.4 mg/dL (ref 8.4–10.5)
Creatinine, Ser: 0.69 mg/dL (ref 0.40–1.20)
GFR: 88.22 mL/min (ref >60.00)
Glucose, Bld: 112 mg/dL — ABNORMAL HIGH (ref 70–99)
POTASSIUM: 4.9 meq/L (ref 3.5–5.1)
SODIUM: 144 meq/L (ref 135–145)

## 2015-09-23 LAB — TSH: TSH: 1.7 u[IU]/mL (ref 0.35–4.50)

## 2015-09-23 LAB — VITAMIN D 25 HYDROXY (VIT D DEFICIENCY, FRACTURES): VITD: 32.24 ng/mL (ref 30.00–100.00)

## 2015-09-23 NOTE — Progress Notes (Signed)
Subjective:    Patient ID: Megan Reilly, female    DOB: [birth year], 74 y.o.   MRN: [id number]  HPI Patient here for physical. She sees gynecologist regularly. She had DEXA scan last year which showed osteopenia. She takes regular calcium but infrequent vitamin D. She has some arthritis issues which are relatively stable. Immunizations up-to-date. Never smoked. Exercises 2-3 days per week. She takes no regular prescription medications. She gets regular mammograms. Colonoscopy up-to-date.  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 No further syncope episodes since last year  Past Medical History  Diagnosis Date  . Arthritis   . Allergy   . Blood transfusion abn reaction or complication, no procedure mishap     74 years old after tonsilectomy  . Osteopenia    Past Surgical History  Procedure Laterality Date  . Tonsillectomy  1945  . Abdominal hysterectomy      partial 1983  . Foot surgery      bunectomy, hammertoe, bil  . Colonoscopy      reports that she has never smoked. She has never used smokeless tobacco. She reports that she drinks about 3.0 oz of alcohol per week. She reports that she does not use illicit drugs. family history includes Diabetes in her father; Heart disease in her mother; Heart disease (age of onset: 1) in her father; Heart failure in her mother; Hyperlipidemia in her father and mother; Stroke in her mother. No Known Allergies    Review of Systems  Constitutional: Negative for fever, activity change, appetite change, fatigue and unexpected weight change.  HENT: Negative for ear pain, hearing loss, sore throat, trouble swallowing and voice change.   Eyes: Negative for visual disturbance.  Respiratory: Negative for cough and shortness of breath.   Cardiovascular: Negative for chest pain and palpitations.  Gastrointestinal: Negative for abdominal pain, diarrhea, constipation and blood in stool.  Endocrine: Negative for polydipsia and  polyuria.  Genitourinary: Negative for dysuria and hematuria.  Musculoskeletal: Positive for back pain and arthralgias. Negative for myalgias.  Skin: Negative for rash.  Neurological: Negative for dizziness, syncope and headaches.  Hematological: Negative for adenopathy.  Psychiatric/Behavioral: Positive for sleep disturbance. Negative for confusion and dysphoric mood.       Objective:   Physical Exam  Constitutional: She is oriented to person, place, and time. She appears well-developed and well-nourished.  HENT:  Head: Normocephalic and atraumatic.  Eyes: EOM are normal. Pupils are equal, round, and reactive to light.  Neck: Normal range of motion. Neck supple. No thyromegaly present.  Cardiovascular: Normal rate, regular rhythm and normal heart sounds.   No murmur heard. Pulmonary/Chest: Breath sounds normal. No respiratory distress. She has no wheezes. She has no rales.  Abdominal: Soft. Bowel sounds are normal. She exhibits no distension and no mass. There is no tenderness. There is no rebound and no guarding.  Genitourinary:  Per GYN  Musculoskeletal: Normal range of motion. She exhibits no edema.  Lymphadenopathy:    She has no cervical adenopathy.  Neurological: She is alert and oriented to person, place, and time. She displays normal reflexes. No cranial nerve deficit.  Skin: No rash noted.  Psychiatric: She has a normal mood and affect. Her behavior is normal. Judgment and thought content normal.          Assessment & Plan:  Physical exam. Obtain screening labs including vitamin D level. Vaccines are up-to-date. We discussed weightbearing exercise. Include some upper body strengthening. Increase consistency of vitamin D intake.  Continue yearly flu vaccine

## 2015-09-23 NOTE — Patient Instructions (Signed)
Continue with daily Calcium 1200 mg  And Vit D 1,000 IU  Continue with daily weight bearing exercise.

## 2015-09-23 NOTE — Progress Notes (Signed)
Pre visit review using our clinic review tool, if applicable. No additional management support is needed unless otherwise documented below in the visit note. 

## 2015-11-04 ENCOUNTER — Other Ambulatory Visit: Payer: Self-pay | Admitting: Dermatology

## 2015-11-04 DIAGNOSIS — L821 Other seborrheic keratosis: Secondary | ICD-10-CM | POA: Diagnosis not present

## 2015-11-04 DIAGNOSIS — L570 Actinic keratosis: Secondary | ICD-10-CM | POA: Diagnosis not present

## 2015-11-04 DIAGNOSIS — C44612 Basal cell carcinoma of skin of right upper limb, including shoulder: Secondary | ICD-10-CM | POA: Diagnosis not present

## 2015-11-25 ENCOUNTER — Other Ambulatory Visit: Payer: Self-pay

## 2015-11-25 DIAGNOSIS — M25511 Pain in right shoulder: Secondary | ICD-10-CM | POA: Diagnosis not present

## 2015-11-25 DIAGNOSIS — Z1231 Encounter for screening mammogram for malignant neoplasm of breast: Secondary | ICD-10-CM

## 2016-01-05 DIAGNOSIS — R69 Illness, unspecified: Secondary | ICD-10-CM | POA: Diagnosis not present

## 2016-01-08 ENCOUNTER — Ambulatory Visit
Admission: RE | Admit: 2016-01-08 | Discharge: 2016-01-08 | Disposition: A | Discharge status: Home / Self Care | Payer: Medicare HMO | Source: Ambulatory Visit

## 2016-01-08 DIAGNOSIS — Z1231 Encounter for screening mammogram for malignant neoplasm of breast: Secondary | ICD-10-CM | POA: Diagnosis not present

## 2016-01-22 ENCOUNTER — Other Ambulatory Visit: Payer: Self-pay | Admitting: Dermatology

## 2016-01-22 DIAGNOSIS — C44612 Basal cell carcinoma of skin of right upper limb, including shoulder: Secondary | ICD-10-CM | POA: Diagnosis not present

## 2016-03-02 ENCOUNTER — Ambulatory Visit (INDEPENDENT_AMBULATORY_CARE_PROVIDER_SITE_OTHER): Payer: Medicare HMO | Admitting: Family Medicine

## 2016-03-02 VITALS — BP 136/82 | HR 75 | Temp 98.2°F | Ht 70.0 in | Wt 156.0 lb

## 2016-03-02 DIAGNOSIS — M170 Bilateral primary osteoarthritis of knee: Secondary | ICD-10-CM

## 2016-03-02 DIAGNOSIS — R600 Localized edema: Secondary | ICD-10-CM | POA: Diagnosis not present

## 2016-03-02 NOTE — Progress Notes (Signed)
Pre visit review using our clinic review tool, if applicable. No additional management support is needed unless otherwise documented below in the visit note. 

## 2016-03-02 NOTE — Progress Notes (Signed)
   Subjective:    Patient ID: Megan Reilly, female    DOB: May 17, 1941, 75 y.o.   MRN: [id number]  HPI Patient seen for the following issues  Bilateral foot and ankle edema. Present for several months but slightly progressive recently. No dietary changes. Symptoms improved with elevation and are improved first thing in the morning. Denies any dyspnea with exertion. No orthopnea. No claudication symptoms. She has used nonprescription compression hose in the past which helped slightly. Normal renal function. TSH and other labs were normal last December No exacerbating factors.  Bilateral knee pain right greater than left. She saw orthopedist a few months ago and reportedly had x-rays of her right knee which showed "arthritis ". She's had minimal if any swelling. No injury. Still able to ambulate without much difficulty. She does not take any regular medications. Occasional Tylenol. Infrequent use of Advil. She has some arthritis pain in her hands but no generalized arthralgias.  Past Medical History  Diagnosis Date  . Arthritis   . Allergy   . Blood transfusion abn reaction or complication, no procedure mishap     75 years old after tonsilectomy  . Osteopenia    Past Surgical History  Procedure Laterality Date  . Tonsillectomy  1945  . Abdominal hysterectomy      partial 1983  . Foot surgery      bunectomy, hammertoe, bil  . Colonoscopy      reports that she has never smoked. She has never used smokeless tobacco. She reports that she drinks about 3.0 oz of alcohol per week. She reports that she does not use illicit drugs. family history includes Diabetes in her father; Heart disease in her mother; Heart disease (age of onset: 82) in her father; Heart failure in her mother; Hyperlipidemia in her father and mother; Stroke in her mother. No Known Allergies    Review of Systems  Constitutional: Negative for fatigue.  Eyes: Negative for visual disturbance.  Respiratory: Negative  for cough, chest tightness, shortness of breath and wheezing.   Cardiovascular: Positive for leg swelling (Mostly ankles and feet as per history of present illness). Negative for chest pain and palpitations.  Musculoskeletal: Positive for arthralgias.  Neurological: Negative for dizziness, seizures, syncope, weakness, light-headedness and headaches.       Objective:   Physical Exam  Constitutional: She appears well-developed and well-nourished.  Cardiovascular: Normal rate and regular rhythm.   Pulmonary/Chest: Effort normal and breath sounds normal. No respiratory distress. She has no wheezes. She has no rales.  Musculoskeletal: She exhibits edema.  Trace edema feet and ankles bilaterally. This is nonpitting. Both feet are warm to touch with good capillary refill. Good dorsalis pedis and posterior tibial pulses.  Knee exam-right and left reveals no effusion. No warmth. No erythema. No ecchymosis. No localized tenderness. No popliteal edema.          Assessment & Plan:  #1 mild bilateral foot and leg edema. Suspected venous stasis. Edema consistently improves with elevation. We've recommended continued compression hose. We've not recommended any diuretics at this point. She has no evidence whatsoever to suggest arterial disease  #2 bilateral knee pain. Suspect osteoarthritis. We discussed importance of regular strengthening exercises especially for the quadriceps. Avoid regular use of nonsteroidals given her age. Use Tylenol as needed.  Eulas Post MD [facility] at [facility]

## 2016-03-02 NOTE — Patient Instructions (Signed)
Osteoarthritis Osteoarthritis is a disease that causes soreness and inflammation of a joint. It occurs when the cartilage at the affected joint wears down. Cartilage acts as a cushion, covering the ends of bones where they meet to form a joint. Osteoarthritis is the most common form of arthritis. It often occurs in older people. The joints affected most often by this condition include those in the:  Ends of the fingers.  Thumbs.  Neck.  Lower back.  Knees.  Hips. CAUSES  Over time, the cartilage that covers the ends of bones begins to wear away. This causes bone to rub on bone, producing pain and stiffness in the affected joints.  RISK FACTORS Certain factors can increase your chances of having osteoarthritis, including:  Older age.  Excessive body weight.  Overuse of joints.  Previous joint injury. SIGNS AND SYMPTOMS   Pain, swelling, and stiffness in the joint.  Over time, the joint may lose its normal shape.  Small deposits of bone (osteophytes) may grow on the edges of the joint.  Bits of bone or cartilage can break off and float inside the joint space. This may cause more pain and damage. DIAGNOSIS  Your health care provider will do a physical exam and ask about your symptoms. Various tests may be ordered, such as:  X-rays of the affected joint.  Blood tests to rule out other types of arthritis. Additional tests may be used to diagnose your condition. TREATMENT  Goals of treatment are to control pain and improve joint function. Treatment plans may include:  A prescribed exercise program that allows for rest and joint relief.  A weight control plan.  Pain relief techniques, such as:  Properly applied heat and cold.  Electric pulses delivered to nerve endings under the skin (transcutaneous electrical nerve stimulation [TENS]).  Massage.  Certain nutritional supplements.  Medicines to control pain, such as:  Acetaminophen.  Nonsteroidal  anti-inflammatory drugs (NSAIDs), such as naproxen.  Narcotic or central-acting agents, such as tramadol.  Corticosteroids. These can be given orally or as an injection.  Surgery to reposition the bones and relieve pain (osteotomy) or to remove loose pieces of bone and cartilage. Joint replacement may be needed in advanced states of osteoarthritis. HOME CARE INSTRUCTIONS   Take medicines only as directed by your health care provider.  Maintain a healthy weight. Follow your health care provider's instructions for weight control. This may include dietary instructions.  Exercise as directed. Your health care provider can recommend specific types of exercise. These may include:  Strengthening exercises. These are done to strengthen the muscles that support joints affected by arthritis. They can be performed with weights or with exercise bands to add resistance.  Aerobic activities. These are exercises, such as brisk walking or low-impact aerobics, that get your heart pumping.  Range-of-motion activities. These keep your joints limber.  Balance and agility exercises. These help you maintain daily living skills.  Rest your affected joints as directed by your health care provider.  Keep all follow-up visits as directed by your health care provider. SEEK MEDICAL CARE IF:   Your skin turns red.  You develop a rash in addition to your joint pain.  You have worsening joint pain.  You have a fever along with joint or muscle aches. SEEK IMMEDIATE MEDICAL CARE IF:  You have a significant loss of weight or appetite.  You have night sweats. FOR MORE INFORMATION   National Institute of Arthritis and Musculoskeletal and Skin Diseases: www.niams.nih.gov  National Institute on   Aging: http://kim-miller.com/  American College of Rheumatology: www.rheumatology.org   This information is not intended to replace advice given to you by your health care provider. Make sure you discuss any questions you  have with your health care provider.   Document Released: 09/27/2005 Document Revised: 10/18/2014 Document Reviewed: 06/04/2013 Elsevier Interactive Patient Education 2016 Elsevier Inc. Venous Stasis or Chronic Venous Insufficiency Chronic venous insufficiency, also called venous stasis, is a condition that affects the veins in the legs. The condition prevents blood from being pumped through these veins effectively. Blood may no longer be pumped effectively from the legs back to the heart. This condition can range from mild to severe. With proper treatment, you should be able to continue with an active life. CAUSES  Chronic venous insufficiency occurs when the vein walls become stretched, weakened, or damaged or when valves within the vein are damaged. Some common causes of this include:  High blood pressure inside the veins (venous hypertension).  Increased blood pressure in the leg veins from long periods of sitting or standing.  A blood clot that blocks blood flow in a vein (deep vein thrombosis).  Inflammation of a superficial vein (phlebitis) that causes a blood clot to form. RISK FACTORS Various things can make you more likely to develop chronic venous insufficiency, including:  Family history of this condition.  Obesity.  Pregnancy.  Sedentary lifestyle.  Smoking.  Jobs requiring long periods of standing or sitting in one place.  Being a certain age. Women in their 40s and 9s and men in their 32s are more likely to develop this condition. SIGNS AND SYMPTOMS  Symptoms may include:   Varicose veins.  Skin breakdown or ulcers.  Reddened or discolored skin on the leg.  Brown, smooth, tight, and painful skin just above the ankle, usually on the inside surface (lipodermatosclerosis).  Swelling. DIAGNOSIS  To diagnose this condition, your health care provider will take a medical history and do a physical exam. The following tests may be ordered to confirm the  diagnosis:  Duplex ultrasound--A procedure that produces a picture of a blood vessel and nearby organs and also provides information on blood flow through the blood vessel.  Plethysmography--A procedure that tests blood flow.  A venogram, or venography--A procedure used to look at the veins using X-ray and dye. TREATMENT The goals of treatment are to help you return to an active life and to minimize pain or disability. Treatment will depend on the severity of the condition. Medical procedures may be needed for severe cases. Treatment options may include:   Use of compression stockings. These can help with symptoms and lower the chances of the problem getting worse, but they do not cure the problem.  Sclerotherapy--A procedure involving an injection of a material that "dissolves" the damaged veins. Other veins in the network of blood vessels take over the function of the damaged veins.  Surgery to remove the vein or cut off blood flow through the vein (vein stripping or laser ablation surgery).  Surgery to repair a valve. HOME CARE INSTRUCTIONS   Wear compression stockings as directed by your health care provider.  Only take over-the-counter or prescription medicines for pain, discomfort, or fever as directed by your health care provider.  Follow up with your health care provider as directed. SEEK MEDICAL CARE IF:   You have redness, swelling, or increasing pain in the affected area.  You see a red streak or line that extends up or down from the affected area.  You  have a breakdown or loss of skin in the affected area, even if the breakdown is small.  You have an injury to the affected area. SEEK IMMEDIATE MEDICAL CARE IF:   You have an injury and open wound in the affected area.  Your pain is severe and does not improve with medicine.  You have sudden numbness or weakness in the foot or ankle below the affected area, or you have trouble moving your foot or ankle.  You have a  fever or persistent symptoms for more than 2-3 days.  You have a fever and your symptoms suddenly get worse. MAKE SURE YOU:   Understand these instructions.  Will watch your condition.  Will get help right away if you are not doing well or get worse.   This information is not intended to replace advice given to you by your health care provider. Make sure you discuss any questions you have with your health care provider.   Document Released: 01/31/2007 Document Revised: 07/18/2013 Document Reviewed: 06/04/2013 Elsevier Interactive Patient Education [employer].

## 2016-05-03 ENCOUNTER — Telehealth: Payer: Self-pay

## 2016-05-03 ENCOUNTER — Telehealth: Payer: Self-pay | Admitting: Family Medicine

## 2016-05-03 NOTE — Telephone Encounter (Signed)
Pt is scheduled for an appointment with Dr. Elease Hashimoto on 05/04/16 at 11:45am.  Patient Name: Megan Reilly Gender: Female DOB: 09/29/1941 Age: 75 Y 28 M 9 D Return Phone Number: [phone] (Primary) Address: City/State/Zip: [place] Client [place] [facility] - Client Client Site [place] - Day Physician Carolann Littler - MD Contact Type Call Who Is Calling Patient / Member / Family / Caregiver Call Type Triage / Clinical Relationship To Patient Self Return Phone Number [phone] (Primary) Chief Complaint Weakness, Generalized Reason for Call Symptomatic / Request for [place] states she has been sleeping a lot, not feeling good, thinks she might have a virus.Appointment Disposition EMR Appointment Scheduled Info pasted into Epic Yes PreDisposition Did not know what to do Translation No Nurse Assessment Nurse: Dimas Chyle, RN, Dellis Filbert Date/Time Eilene Ghazi Time): 05/03/2016 12:45:09 PM Confirm and document reason for call. If symptomatic, describe symptoms. You must click the next button to save text entered. ---Caller states she has been sleeping a lot, not feeling good, thinks she might have a virus. Started last Thursday. No fever Has the patient traveled out of the country within the last 30 days? ---No Does the patient have any new or worsening symptoms? ---Yes Will a triage be completed? ---Yes Related visit to physician within the last 2 weeks? ---N/A Does the PT have any chronic conditions? (i.e. diabetes, asthma, etc.) ---Yes List chronic conditions. ---hypotension Is this a behavioral health or substance abuse call? ---No Guidelines Guideline Title Affirmed Question Affirmed Notes Nurse Date/Time (Eastern Time) Weakness (Generalized) and Fatigue [1] MODERATE weakness (i.e., interferes with work, school, normal activities) AND [2] persists > 3 days Terrence Dupont 05/03/2016 12:47:36  PM Disp. Time Eilene Ghazi Time) Disposition Final User 05/03/2016 12:49:47 PM See Physician within 24 Hours Yes Dimas Chyle, RN, Guerry Minors Understands: Yes Disagree/Comply: Comply Care Advice Given Per Guideline SEE PHYSICIAN WITHIN 24 HOURS: * IF OFFICE WILL BE OPEN: You need to be seen within the next 24 hours. Call your doctor when the office opens, and make an appointment. CALL BACK IF: * You become worse. CARE ADVICE given perWeakness and Fatigue (Adult) guideline. Referrals REFERRED TO PCP OFFICE

## 2016-05-03 NOTE — Telephone Encounter (Signed)
[facility] [place] [facility] - Client [place] [facility] Patient Name: LINAE MURA DOB: 1941-09-21 Initial Comment Caller states she has been sleeping a lot, not feeling good, thinks she might have a virus. Nurse Assessment Nurse: Dimas Chyle, RN, Dellis Filbert Date/Time Eilene Ghazi Time): 05/03/2016 12:45:09 PM Confirm and document reason for call. If symptomatic, describe symptoms. You must click the next button to save text entered. ---Caller states she has been sleeping a lot, not feeling good, thinks she might have a virus. Started last Thursday. No fever. Has the patient traveled out of the country within the last 30 days? ---No Does the patient have any new or worsening symptoms? ---Yes Will a triage be completed? ---Yes Related visit to physician within the last 2 weeks? ---N/A Does the PT have any chronic conditions? (i.e. diabetes, asthma, etc.) ---Yes List chronic conditions. ---hypotension Is this a behavioral health or substance abuse call? ---No Guidelines Guideline Title Affirmed Question Affirmed Notes Weakness (Generalized) and Fatigue [1] MODERATE weakness (i.e., interferes with work, school, normal activities) AND [2] persists > 3 days Final Disposition User See Physician within 24 Hours [place] Neighbors, RN, [employer] Referrals REFERRED TO PCP OFFICE Disagree/Comply: Leta Baptist

## 2016-05-04 ENCOUNTER — Ambulatory Visit (INDEPENDENT_AMBULATORY_CARE_PROVIDER_SITE_OTHER): Payer: Medicare HMO | Admitting: Family Medicine

## 2016-05-04 VITALS — BP 140/80 | HR 79 | Temp 98.6°F | Ht 70.0 in | Wt 147.9 lb

## 2016-05-04 DIAGNOSIS — R5383 Other fatigue: Secondary | ICD-10-CM

## 2016-05-04 NOTE — Patient Instructions (Signed)
Stay well hydrated Follow up for any fever or new symptoms such as rash, urinary symptoms, or abdominal pain.

## 2016-05-04 NOTE — Progress Notes (Signed)
Subjective:     Patient ID: Megan Reilly, female   DOB: 11-05-1940, 75 y.o.   MRN: [id number]  HPI Patient seen today for acute visit with very nonspecific symptoms of fatigue Onset a few days ago. She recalls last Friday having some chills but no fever. She denies any headache, nasal congestion, cough, dysuria, skin rash, sinusitis symptoms, tick bites, abdominal pain. Last Saturday she felt somewhat better. She then noted fatigue and by Sunday. She denies any recent heat related illness. No dizziness. Appetite has been good. She has lost some weight recently due to her efforts She states she feels better today compared with yesterday  Past Medical History:  Diagnosis Date  . Allergy   . Arthritis   . Blood transfusion abn reaction or complication, no procedure mishap    75 years old after tonsilectomy  . Osteopenia    Past Surgical History:  Procedure Laterality Date  . ABDOMINAL HYSTERECTOMY     partial 1983  . COLONOSCOPY    . FOOT SURGERY     bunectomy, hammertoe, bil  . TONSILLECTOMY  1945    reports that she has never smoked. She has never used smokeless tobacco. She reports that she drinks about 3.0 oz of alcohol per week . She reports that she does not use drugs. family history includes Diabetes in her father; Heart disease in her mother; Heart disease (age of onset: 57) in her father; Heart failure in her mother; Hyperlipidemia in her father and mother; Stroke in her mother. No Known Allergies   Review of Systems  Constitutional: Positive for chills and fatigue. Negative for appetite change, fever and unexpected weight change.  HENT: Negative for congestion and sore throat.   Respiratory: Negative for cough.   Cardiovascular: Negative for chest pain.  Gastrointestinal: Negative for abdominal pain, diarrhea, nausea and vomiting.  Musculoskeletal: Negative for arthralgias, myalgias and neck stiffness.  Skin: Negative for rash.  Neurological: Negative for headaches.   Hematological: Negative for adenopathy.       Objective:   Physical Exam  Constitutional: She appears well-developed and well-nourished.  HENT:  Right Ear: External ear normal.  Left Ear: External ear normal.  Mouth/Throat: Oropharynx is clear and moist.  Neck: Neck supple.  Cardiovascular: Normal rate and regular rhythm.   Pulmonary/Chest: Effort normal and breath sounds normal. No respiratory distress. She has no wheezes. She has no rales.  Abdominal: Soft. She exhibits no mass. There is no tenderness. There is no rebound and no guarding.  Musculoskeletal: She exhibits no edema.  Lymphadenopathy:    She has no cervical adenopathy.  Skin: No rash noted.       Assessment:     Nonspecific fatigue. Present past few days. She has not had any fever. Nonfocal exam and nontoxic in appearance. She may have viral syndrome. She has no urinary symptoms or other specific concerns    Plan:     -Observe for now. -Stay well-hydrated. -If not improving over the last couple days consider CBC, comprehensive metabolic panel, and urinalysis.  Eulas Post MD [facility]

## 2016-05-04 NOTE — Progress Notes (Signed)
Pre visit review using our clinic review tool, if applicable. No additional management support is needed unless otherwise documented below in the visit note. 

## 2016-05-07 ENCOUNTER — Telehealth: Payer: Self-pay | Admitting: Family Medicine

## 2016-05-07 NOTE — Telephone Encounter (Signed)
Pt is calling to let md know she is better

## 2016-05-26 DIAGNOSIS — Z0000 Encounter for general adult medical examination without abnormal findings: Secondary | ICD-10-CM | POA: Diagnosis not present

## 2016-05-26 DIAGNOSIS — J3089 Other allergic rhinitis: Secondary | ICD-10-CM | POA: Diagnosis not present

## 2016-07-06 DIAGNOSIS — R69 Illness, unspecified: Secondary | ICD-10-CM | POA: Diagnosis not present

## 2016-07-15 ENCOUNTER — Ambulatory Visit (INDEPENDENT_AMBULATORY_CARE_PROVIDER_SITE_OTHER): Payer: Medicare HMO | Admitting: Family Medicine

## 2016-07-15 DIAGNOSIS — Z23 Encounter for immunization: Secondary | ICD-10-CM | POA: Diagnosis not present

## 2016-09-29 ENCOUNTER — Ambulatory Visit (INDEPENDENT_AMBULATORY_CARE_PROVIDER_SITE_OTHER): Payer: Medicare HMO | Admitting: Family Medicine

## 2016-09-29 ENCOUNTER — Encounter: Payer: Self-pay | Admitting: Family Medicine

## 2016-09-29 VITALS — BP 130/74 | HR 77 | Temp 97.9°F | Ht 67.5 in | Wt 153.0 lb

## 2016-09-29 DIAGNOSIS — Z23 Encounter for immunization: Secondary | ICD-10-CM | POA: Diagnosis not present

## 2016-09-29 DIAGNOSIS — Z0000 Encounter for general adult medical examination without abnormal findings: Secondary | ICD-10-CM | POA: Diagnosis not present

## 2016-09-29 LAB — HEPATIC FUNCTION PANEL
ALK PHOS: 67 U/L (ref 39–117)
ALT: 12 U/L (ref 0–35)
AST: 20 U/L (ref 0–37)
Albumin: 4.3 g/dL (ref 3.5–5.2)
BILIRUBIN DIRECT: 0.1 mg/dL (ref 0.0–0.3)
BILIRUBIN TOTAL: 0.7 mg/dL (ref 0.2–1.2)
Total Protein: 6.2 g/dL (ref 6.0–8.3)

## 2016-09-29 LAB — LIPID PANEL
CHOL/HDL RATIO: 3
Cholesterol: 193 mg/dL (ref 0–200)
HDL: 62.5 mg/dL (ref >39.00)
LDL Cholesterol: 113 mg/dL — ABNORMAL HIGH (ref 0–99)
NonHDL: 130.61
Triglycerides: 87 mg/dL (ref 0.0–149.0)
VLDL: 17.4 mg/dL (ref 0.0–40.0)

## 2016-09-29 LAB — CBC WITH DIFFERENTIAL/PLATELET
Basophils Absolute: 0 10*3/uL (ref 0.0–0.1)
Basophils Relative: 0.5 % (ref 0.0–3.0)
EOS ABS: 0.2 10*3/uL (ref 0.0–0.7)
Eosinophils Relative: 2.4 % (ref 0.0–5.0)
HCT: 41.1 % (ref 36.0–46.0)
Hemoglobin: 13.9 g/dL (ref 12.0–15.0)
Lymphocytes Relative: 20.8 % (ref 12.0–46.0)
Lymphs Abs: 1.4 10*3/uL (ref 0.7–4.0)
MCHC: 33.8 g/dL (ref 30.0–36.0)
MCV: 94.5 fl (ref 78.0–100.0)
MONO ABS: 0.4 10*3/uL (ref 0.1–1.0)
MONOS PCT: 6.3 % (ref 3.0–12.0)
NEUTROS PCT: 70 % (ref 43.0–77.0)
Neutro Abs: 4.7 10*3/uL (ref 1.4–7.7)
Platelets: 201 10*3/uL (ref 150.0–400.0)
RBC: 4.35 Mil/uL (ref 3.87–5.11)
RDW: 12.7 % (ref 11.5–15.5)
WBC: 6.8 10*3/uL (ref 4.0–10.5)

## 2016-09-29 LAB — TSH: TSH: 1.66 u[IU]/mL (ref 0.35–4.50)

## 2016-09-29 LAB — BASIC METABOLIC PANEL
BUN: 15 mg/dL (ref 6–23)
CALCIUM: 10.1 mg/dL (ref 8.4–10.5)
CO2: 30 mEq/L (ref 19–32)
Chloride: 107 mEq/L (ref 96–112)
Creatinine, Ser: 0.67 mg/dL (ref 0.40–1.20)
GFR: 91.01 mL/min (ref >60.00)
Glucose, Bld: 98 mg/dL (ref 70–99)
Potassium: 4.1 mEq/L (ref 3.5–5.1)
SODIUM: 143 meq/L (ref 135–145)

## 2016-09-29 LAB — VITAMIN D 25 HYDROXY (VIT D DEFICIENCY, FRACTURES): VITD: 38.45 ng/mL (ref 30.00–100.00)

## 2016-09-29 NOTE — Progress Notes (Signed)
Pre visit review using our clinic review tool, if applicable. No additional management support is needed unless otherwise documented below in the visit note. 

## 2016-09-29 NOTE — Patient Instructions (Signed)
Continue with regular weightbearing exercise Continue daily calcium 1200 mg daily and vitamin D 800-1,000 international units daily Continue with yearly mammogram We will call you with lab work done today Repeat bone density scan this spring

## 2016-09-29 NOTE — Progress Notes (Signed)
Subjective:     Patient ID: Megan Reilly, female   DOB: 20-Jul-1941, 75 y.o.   MRN: [id number]  HPI Patient seen for physical exam. She has history of osteoarthritis, osteopenia, history of vasovagal syncope (none recently), remote history of subarachnoid bleed secondary to injury. She currently takes no regular prescription medications. On Zyrtec for allergy symptoms. In reviewing immunizations she needs Pneumovax otherwise immunizations are up-to-date. She's had some recent progressive right knee pain. Has seen orthopedist and has been told she has osteoarthritis. Colonoscopy up-to-date. She gets yearly mammograms. Takes daily calcium and vitamin D No recent falls.  No depression symptoms.  Past Medical History:  Diagnosis Date  . Allergy   . Arthritis   . Blood transfusion abn reaction or complication, no procedure mishap    75 years old after tonsilectomy  . Osteopenia    Past Surgical History:  Procedure Laterality Date  . ABDOMINAL HYSTERECTOMY     partial 1983  . COLONOSCOPY    . FOOT SURGERY     bunectomy, hammertoe, bil  . TONSILLECTOMY  1945    reports that she has never smoked. She has never used smokeless tobacco. She reports that she drinks about 3.0 oz of alcohol per week . She reports that she does not use drugs. family history includes Diabetes in her father; Heart disease in her mother; Heart disease (age of onset: 23) in her father; Heart failure in her mother; Hyperlipidemia in her father and mother; Stroke in her mother. No Known Allergies   Review of Systems  Constitutional: Negative for activity change, appetite change, fatigue, fever and unexpected weight change.  HENT: Negative for ear pain, hearing loss, sore throat and trouble swallowing.   Eyes: Negative for visual disturbance.  Respiratory: Negative for cough and shortness of breath.   Cardiovascular: Negative for chest pain and palpitations.  Gastrointestinal: Negative for abdominal pain, blood in stool,  constipation and diarrhea.  Genitourinary: Negative for dysuria and hematuria.  Musculoskeletal: Positive for arthralgias. Negative for back pain and myalgias.  Skin: Negative for rash.  Neurological: Negative for dizziness, syncope and headaches.  Hematological: Negative for adenopathy.  Psychiatric/Behavioral: Negative for confusion and dysphoric mood.       Objective:   Physical Exam  Constitutional: She is oriented to person, place, and time. She appears well-developed and well-nourished.  HENT:  Head: Normocephalic and atraumatic.  Eyes: EOM are normal. Pupils are equal, round, and reactive to light.  Neck: Normal range of motion. Neck supple. No thyromegaly present.  Cardiovascular: Normal rate, regular rhythm and normal heart sounds.   No murmur heard. Pulmonary/Chest: Breath sounds normal. No respiratory distress. She has no wheezes. She has no rales.  Abdominal: Soft. Bowel sounds are normal. She exhibits no distension and no mass. There is no tenderness. There is no rebound and no guarding.  Musculoskeletal: Normal range of motion. She exhibits no edema.  Increased crepitus with flexion and extension of right knee No warmth or erythema  Lymphadenopathy:    She has no cervical adenopathy.  Neurological: She is alert and oriented to person, place, and time. She displays normal reflexes. No cranial nerve deficit.  Skin: No rash noted.  Psychiatric: She has a normal mood and affect. Her behavior is normal. Judgment and thought content normal.       Assessment:     Physical exam. Patient history of osteopenia and osteoarthritis mostly involving the right knee    Plan:     -Obtain screening lab work -Pneumovax  given -Continue with yearly flu vaccine -Continue daily calcium and vitamin D supplementation and regular weightbearing exercise -She will get repeat DEXA scan this spring  Eulas Post MD  [facility]

## 2016-10-07 DIAGNOSIS — M1711 Unilateral primary osteoarthritis, right knee: Secondary | ICD-10-CM | POA: Diagnosis not present

## 2016-10-07 DIAGNOSIS — G8929 Other chronic pain: Secondary | ICD-10-CM | POA: Diagnosis not present

## 2016-10-07 DIAGNOSIS — M25561 Pain in right knee: Secondary | ICD-10-CM | POA: Diagnosis not present

## 2016-11-02 DIAGNOSIS — L719 Rosacea, unspecified: Secondary | ICD-10-CM | POA: Diagnosis not present

## 2016-11-02 DIAGNOSIS — L570 Actinic keratosis: Secondary | ICD-10-CM | POA: Diagnosis not present

## 2016-11-02 DIAGNOSIS — D692 Other nonthrombocytopenic purpura: Secondary | ICD-10-CM | POA: Diagnosis not present

## 2016-11-02 DIAGNOSIS — D229 Melanocytic nevi, unspecified: Secondary | ICD-10-CM | POA: Diagnosis not present

## 2016-11-02 DIAGNOSIS — Z85828 Personal history of other malignant neoplasm of skin: Secondary | ICD-10-CM | POA: Diagnosis not present

## 2016-11-30 ENCOUNTER — Other Ambulatory Visit: Payer: Self-pay | Admitting: Family Medicine

## 2016-11-30 DIAGNOSIS — Z1231 Encounter for screening mammogram for malignant neoplasm of breast: Secondary | ICD-10-CM

## 2016-12-03 ENCOUNTER — Other Ambulatory Visit: Payer: Self-pay | Admitting: *Deleted

## 2016-12-03 DIAGNOSIS — M8580 Other specified disorders of bone density and structure, unspecified site: Secondary | ICD-10-CM

## 2017-01-04 DIAGNOSIS — R69 Illness, unspecified: Secondary | ICD-10-CM | POA: Diagnosis not present

## 2017-01-10 ENCOUNTER — Ambulatory Visit (INDEPENDENT_AMBULATORY_CARE_PROVIDER_SITE_OTHER): Payer: Medicare HMO | Admitting: Women's Health

## 2017-01-10 ENCOUNTER — Encounter: Payer: Self-pay | Admitting: Women's Health

## 2017-01-10 ENCOUNTER — Ambulatory Visit
Admission: RE | Admit: 2017-01-10 | Discharge: 2017-01-10 | Disposition: A | Discharge status: Home / Self Care | Payer: Medicare HMO | Source: Ambulatory Visit | Attending: Family Medicine | Admitting: Family Medicine

## 2017-01-10 VITALS — BP 123/74 | Ht 69.0 in | Wt 157.8 lb

## 2017-01-10 DIAGNOSIS — M8580 Other specified disorders of bone density and structure, unspecified site: Secondary | ICD-10-CM

## 2017-01-10 DIAGNOSIS — Z01419 Encounter for gynecological examination (general) (routine) without abnormal findings: Secondary | ICD-10-CM

## 2017-01-10 DIAGNOSIS — H5210 Myopia, unspecified eye: Secondary | ICD-10-CM | POA: Diagnosis not present

## 2017-01-10 DIAGNOSIS — Z1231 Encounter for screening mammogram for malignant neoplasm of breast: Secondary | ICD-10-CM

## 2017-01-10 DIAGNOSIS — H25813 Combined forms of age-related cataract, bilateral: Secondary | ICD-10-CM | POA: Diagnosis not present

## 2017-01-10 DIAGNOSIS — N952 Postmenopausal atrophic vaginitis: Secondary | ICD-10-CM

## 2017-01-10 NOTE — Progress Notes (Signed)
Megan Reilly Jun 06, 1941 [id number]    History:    Presents for breast and pelvic exam. BSO benign cysts on no HRT. No bleeding. Normal Pap and mammogram history. Primary care manages meds and labs, vaccines current. 02/2015 T score -1.8 femoral neck FRAX 9.5%/1.5%. Not sexually active many years. [facility] 16 negative colonoscopy.  Past medical history, past surgical history, family history and social history were all reviewed and documented in the EPIC chart. Retired Radio producer, continues to substitute. Has 4 sons and 1 daughter.  ROS:  A ROS was performed and pertinent positives and negatives are included.  Exam:  Vitals:   01/10/17 1050  BP: 123/74  Weight: 157 lb 12.8 oz (71.6 kg)  Height: 5\' 9"  (1.753 m)   Body mass index is 23.3 kg/m.   General appearance:  Normal Thyroid:  Symmetrical, normal in size, without palpable masses or nodularity. Respiratory  Auscultation:  Clear without wheezing or rhonchi Cardiovascular  Auscultation:  Regular rate, without rubs, murmurs or gallops  Edema/varicosities:  Not grossly evident Abdominal  Soft,nontender, without masses, guarding or rebound.  Liver/spleen:  No organomegaly noted  Hernia:  None appreciated  Skin  Inspection:  Grossly normal   Breasts: Examined lying and sitting.     Right: Without masses, retractions, discharge or axillary adenopathy.     Left: Without masses, retractions, discharge or axillary adenopathy. Gentitourinary   Inguinal/mons:  Normal without inguinal adenopathy  External genitalia:  Normal  BUS/Urethra/Skene's glands:  Normal  Vagina:  Normal  Cervix:  Normal  Uterus:   normal in size, shape and contour.  Midline and mobile  Adnexa/parametria:     Rt: Without masses or tenderness.   Lt: Without masses or tenderness.  Anus and perineum: Normal  Digital rectal exam: Normal sphincter tone without palpated masses or tenderness  Assessment/Plan:  [name] for breast and pelvic exam  with no complaints.  BSO for benign cysts on no HRT/ no bleeding Asymptomatic vaginal atrophy Osteopenia without elevated FRAX Right knee pain-orthopedist manages Primary care manages labs and meds   Plan: Keep scheduled DEXA 02/2017.  Home safety, fall prevention and importance of weightbearing exercise reviewed. SBE's, continue annual screening mammogram, calcium rich diet, vitamin D 2000 daily encouraged. Pap screening reviewed and explained. Return to office as needed.    Huel Cote [facility], 11:26 AM 01/10/2017

## 2017-01-10 NOTE — Patient Instructions (Signed)
Health Maintenance for Postmenopausal Women Menopause is a normal process in which your reproductive ability comes to an end. This process happens gradually over a span of months to years, usually between the ages of 33 and 38. Menopause is complete when you have missed 12 consecutive menstrual periods. It is important to talk with your health care provider about some of the most common conditions that affect postmenopausal women, such as heart disease, cancer, and bone loss (osteoporosis). Adopting a healthy lifestyle and getting preventive care can help to promote your health and wellness. Those actions can also lower your chances of developing some of these common conditions. What should I know about menopause? During menopause, you may experience a number of symptoms, such as:  Moderate-to-severe hot flashes.  Night sweats.  Decrease in sex drive.  Mood swings.  Headaches.  Tiredness.  Irritability.  Memory problems.  Insomnia. Choosing to treat or not to treat menopausal changes is an individual decision that you make with your health care provider. What should I know about hormone replacement therapy and supplements? Hormone therapy products are effective for treating symptoms that are associated with menopause, such as hot flashes and night sweats. Hormone replacement carries certain risks, especially as you become older. If you are thinking about using estrogen or estrogen with progestin treatments, discuss the benefits and risks with your health care provider. What should I know about heart disease and stroke? Heart disease, heart attack, and stroke become more likely as you age. This may be due, in part, to the hormonal changes that your body experiences during menopause. These can affect how your body processes dietary fats, triglycerides, and cholesterol. Heart attack and stroke are both medical emergencies. There are many things that you can do to help prevent heart disease  and stroke:  Have your blood pressure checked at least every 1-2 years. High blood pressure causes heart disease and increases the risk of stroke.  If you are 48-61 years old, ask your health care provider if you should take aspirin to prevent a heart attack or a stroke.  Do not use any tobacco products, including cigarettes, chewing tobacco, or electronic cigarettes. If you need help quitting, ask your health care provider.  It is important to eat a healthy diet and maintain a healthy weight.  Be sure to include plenty of vegetables, fruits, low-fat dairy products, and lean protein.  Avoid eating foods that are high in solid fats, added sugars, or salt (sodium).  Get regular exercise. This is one of the most important things that you can do for your health.  Try to exercise for at least 150 minutes each week. The type of exercise that you do should increase your heart rate and make you sweat. This is known as moderate-intensity exercise.  Try to do strengthening exercises at least twice each week. Do these in addition to the moderate-intensity exercise.  Know your numbers.Ask your health care provider to check your cholesterol and your blood glucose. Continue to have your blood tested as directed by your health care provider. What should I know about cancer screening? There are several types of cancer. Take the following steps to reduce your risk and to catch any cancer development as early as possible. Breast Cancer  Practice breast self-awareness.  This means understanding how your breasts normally appear and feel.  It also means doing regular breast self-exams. Let your health care provider know about any changes, no matter how small.  If you are 40 or older,  have a clinician do a breast exam (clinical breast exam or CBE) every year. Depending on your age, family history, and medical history, it may be recommended that you also have a yearly breast X-ray (mammogram).  If you  have a family history of breast cancer, talk with your health care provider about genetic screening.  If you are at high risk for breast cancer, talk with your health care provider about having an MRI and a mammogram every year.  Breast cancer (BRCA) gene test is recommended for women who have family members with BRCA-related cancers. Results of the assessment will determine the need for genetic counseling and BRCA1 and for BRCA2 testing. BRCA-related cancers include these types:  Breast. This occurs in males or females.  Ovarian.  Tubal. This may also be called fallopian tube cancer.  Cancer of the abdominal or pelvic lining (peritoneal cancer).  Prostate.  Pancreatic. Cervical, Uterine, and Ovarian Cancer  Your health care provider may recommend that you be screened regularly for cancer of the pelvic organs. These include your ovaries, uterus, and vagina. This screening involves a pelvic exam, which includes checking for microscopic changes to the surface of your cervix (Pap test).  For women ages 21-65, health care providers may recommend a pelvic exam and a Pap test every three years. For women ages 23-65, they may recommend the Pap test and pelvic exam, combined with testing for human papilloma virus (HPV), every five years. Some types of HPV increase your risk of cervical cancer. Testing for HPV may also be done on women of any age who have unclear Pap test results.  Other health care providers may not recommend any screening for nonpregnant women who are considered low risk for pelvic cancer and have no symptoms. Ask your health care provider if a screening pelvic exam is right for you.  If you have had past treatment for cervical cancer or a condition that could lead to cancer, you need Pap tests and screening for cancer for at least 20 years after your treatment. If Pap tests have been discontinued for you, your risk factors (such as having a new sexual partner) need to be reassessed  to determine if you should start having screenings again. Some women have medical problems that increase the chance of getting cervical cancer. In these cases, your health care provider may recommend that you have screening and Pap tests more often.  If you have a family history of uterine cancer or ovarian cancer, talk with your health care provider about genetic screening.  If you have vaginal bleeding after reaching menopause, tell your health care provider.  There are currently no reliable tests available to screen for ovarian cancer. Lung Cancer  Lung cancer screening is recommended for adults 99-83 years old who are at high risk for lung cancer because of a history of smoking. A yearly low-dose CT scan of the lungs is recommended if you:  Currently smoke.  Have a history of at least 30 pack-years of smoking and you currently smoke or have quit within the past 15 years. A pack-year is smoking an average of one pack of cigarettes per day for one year. Yearly screening should:  Continue until it has been 15 years since you quit.  Stop if you develop a health problem that would prevent you from having lung cancer treatment. Colorectal Cancer  This type of cancer can be detected and can often be prevented.  Routine colorectal cancer screening usually begins at age 72 and continues  through age 75.  If you have risk factors for colon cancer, your health care provider may recommend that you be screened at an earlier age.  If you have a family history of colorectal cancer, talk with your health care provider about genetic screening.  Your health care provider may also recommend using home test kits to check for hidden blood in your stool.  A small camera at the end of a tube can be used to examine your colon directly (sigmoidoscopy or colonoscopy). This is done to check for the earliest forms of colorectal cancer.  Direct examination of the colon should be repeated every 5-10 years until  age 75. However, if early forms of precancerous polyps or small growths are found or if you have a family history or genetic risk for colorectal cancer, you may need to be screened more often. Skin Cancer  Check your skin from head to toe regularly.  Monitor any moles. Be sure to tell your health care provider:  About any new moles or changes in moles, especially if there is a change in a mole's shape or color.  If you have a mole that is larger than the size of a pencil eraser.  If any of your family members has a history of skin cancer, especially at a young age, talk with your health care provider about genetic screening.  Always use sunscreen. Apply sunscreen liberally and repeatedly throughout the day.  Whenever you are outside, protect yourself by wearing long sleeves, pants, a wide-brimmed hat, and sunglasses. What should I know about osteoporosis? Osteoporosis is a condition in which bone destruction happens more quickly than new bone creation. After menopause, you may be at an increased risk for osteoporosis. To help prevent osteoporosis or the bone fractures that can happen because of osteoporosis, the following is recommended:  If you are 19-50 years old, get at least 1,000 mg of calcium and at least 600 mg of vitamin D per day.  If you are older than age 50 but younger than age 70, get at least 1,200 mg of calcium and at least 600 mg of vitamin D per day.  If you are older than age 70, get at least 1,200 mg of calcium and at least 800 mg of vitamin D per day. Smoking and excessive alcohol intake increase the risk of osteoporosis. Eat foods that are rich in calcium and vitamin D, and do weight-bearing exercises several times each week as directed by your health care provider. What should I know about how menopause affects my mental health? Depression may occur at any age, but it is more common as you become older. Common symptoms of depression include:  Low or sad  mood.  Changes in sleep patterns.  Changes in appetite or eating patterns.  Feeling an overall lack of motivation or enjoyment of activities that you previously enjoyed.  Frequent crying spells. Talk with your health care provider if you think that you are experiencing depression. What should I know about immunizations? It is important that you get and maintain your immunizations. These include:  Tetanus, diphtheria, and pertussis (Tdap) booster vaccine.  Influenza every year before the flu season begins.  Pneumonia vaccine.  Shingles vaccine. Your health care provider may also recommend other immunizations. This information is not intended to replace advice given to you by your health care provider. Make sure you discuss any questions you have with your health care provider. Document Released: 11/19/2005 Document Revised: 04/16/2016 Document Reviewed: 07/01/2015 Elsevier Interactive Patient   Education  2017 [employer]  

## 2017-01-12 ENCOUNTER — Other Ambulatory Visit: Payer: Self-pay | Admitting: Family Medicine

## 2017-01-12 DIAGNOSIS — R928 Other abnormal and inconclusive findings on diagnostic imaging of breast: Secondary | ICD-10-CM

## 2017-01-19 ENCOUNTER — Ambulatory Visit
Admission: RE | Admit: 2017-01-19 | Discharge: 2017-01-19 | Disposition: A | Discharge status: Home / Self Care | Payer: Medicare HMO | Source: Ambulatory Visit | Attending: Family Medicine | Admitting: Family Medicine

## 2017-01-19 DIAGNOSIS — R928 Other abnormal and inconclusive findings on diagnostic imaging of breast: Secondary | ICD-10-CM

## 2017-01-19 DIAGNOSIS — N6489 Other specified disorders of breast: Secondary | ICD-10-CM | POA: Diagnosis not present

## 2017-01-21 ENCOUNTER — Other Ambulatory Visit: Payer: Medicare HMO

## 2017-02-17 DIAGNOSIS — M1712 Unilateral primary osteoarthritis, left knee: Secondary | ICD-10-CM | POA: Diagnosis not present

## 2017-02-17 DIAGNOSIS — M1711 Unilateral primary osteoarthritis, right knee: Secondary | ICD-10-CM | POA: Diagnosis not present

## 2017-02-23 ENCOUNTER — Encounter: Payer: Self-pay | Admitting: Gynecology

## 2017-03-11 DIAGNOSIS — M810 Age-related osteoporosis without current pathological fracture: Secondary | ICD-10-CM

## 2017-03-11 HISTORY — DX: Age-related osteoporosis without current pathological fracture: M81.0

## 2017-03-15 ENCOUNTER — Ambulatory Visit (INDEPENDENT_AMBULATORY_CARE_PROVIDER_SITE_OTHER): Payer: Medicare HMO

## 2017-03-15 ENCOUNTER — Other Ambulatory Visit: Payer: Self-pay | Admitting: Gynecology

## 2017-03-15 ENCOUNTER — Encounter: Payer: Self-pay | Admitting: Gynecology

## 2017-03-15 DIAGNOSIS — M810 Age-related osteoporosis without current pathological fracture: Secondary | ICD-10-CM

## 2017-03-15 DIAGNOSIS — M8580 Other specified disorders of bone density and structure, unspecified site: Secondary | ICD-10-CM

## 2017-03-31 DIAGNOSIS — R69 Illness, unspecified: Secondary | ICD-10-CM | POA: Diagnosis not present

## 2017-04-05 ENCOUNTER — Telehealth: Payer: Self-pay | Admitting: Gynecology

## 2017-04-05 ENCOUNTER — Ambulatory Visit (INDEPENDENT_AMBULATORY_CARE_PROVIDER_SITE_OTHER): Payer: Medicare HMO | Admitting: Gynecology

## 2017-04-05 ENCOUNTER — Encounter: Payer: Self-pay | Admitting: Gynecology

## 2017-04-05 VITALS — BP 132/80

## 2017-04-05 DIAGNOSIS — M810 Age-related osteoporosis without current pathological fracture: Secondary | ICD-10-CM

## 2017-04-05 NOTE — Progress Notes (Signed)
Patient is a 76 year old that presented to the office today to discuss her recent bone density study that was done after her annual exam. Review of her record indicated she had a bone density study in 2016 which demonstrated that her lowest T score was -1.8 at the left femoral neck and she had a normal Frax analysis. Early this month she had a bone density study which indicated that her lowest T score was at the right femoral neck with a value of -2.9 in the osteoporotic range. Patient states that she takes calcium and vitamin D and exercises regularly and she is on no other medication. She drinks a glass of wine maybe 4 times a week. Her mother did have osteoporosis. She is on no hormone replacement therapy and reports no fractures after the age of 25. She has never been on any corticosteroids.  Based on these findings have explained to her that her bone mass is 25% below normal and her risk of a hip fracture is 11 times greater than the general population. We had a lengthy discussion of the different treatment options for her osteoporosis as follows:  The risk and benefits of oral bisphosphonate therapy were conveyed to the patient in today's visit. Benefits include a significant risk reduction of vertebral, hip and non vertebral fractures. We also discussed potential adverse effects to include precipitation or aggravation of GERD reflux disease as well as the risk of upper GI bleeding although this is reported in the literature to be extremely rare. Other rare adverse effects that were discussed of patient taking oral bisphosphonate included a 1 in 10,000-10,000 risk for osteonecrosis of the jaw, and a risk for atypical femoral fractures of approximately 1 in 5000-10,000. Signs and symptoms of atypical femoral fracture were also discussed and the patient was informed to contact our office if any unusual symptoms were to develop.  The risk and benefits of IV bisphosphonate were discussed with the patient  today to include a significant risk reduction for vertebral, hip and non-vertebral fractures. Potential risk of therapy were also discussed to include a 10-15% chance for a flulike reaction which may last 2-3 days after IV bisphosphonate. We also discussed that longer reactions lasting perhaps weeks to months have been rarely reported to the FDA following IV bisphosphonate treatment. This would also require symptomatic management. Other potential risk that were discussed included the following: Regular eye irritation, approximately 1 and 1000-10,000 risk for osteonecrosis of the jaw as well as a risk for atypical femoral fracture of approximately 1 in 5000-10,000 based on current data on oral bisphosphonate. Signs and symptoms of atypical femoral fracture were also discussed and the patient was asked to contact the office if she develops any of these symptoms.  The risk and benefits of Prolia were discussed with the patient today to include a significant risk reduction for vertebral, hip and non-vertebral fractures. Potential risk also discussed were skin advanced to include rash, pruritus, eczema and other skin reactions. The recurrence of infections and clinical studies with PROLIA, including serious infections were also discussed with the patient. In 3 year clinical trial with Prolia although six-year trial data does not show any evidence for any additional or accelerating risk of infection. Other risks that were discussed included a 1 in 1000-10,000 risk of osteonecrosis of the jaw based on current available data  The risk and benefits of Forteo were discussed with the patient today to include a significant risk reduction for vertebral and nontender vertebral fractures. The most  common side effects that have been reported include: Dizziness, and leg cramps although generally not severe enough to warrant discontinuation in the majority of patient on this medication. The black box warning was discussed in  depth, which is based on the fact that supra-pharmacological doses of Forteo did increase the risk for benign and malignant bone tumors in rats, though there has not been an observed increase incidence in humans to date, based on over a decade of clinical experience. Patient does not have any relative contraindication to Forteo, including no history of previous therapeutic radiation therapy, active neoplasms involving the skeleton or Paget's disease.  She has decided to proceed with the Prolia. We are going to check her calcium and vitamin D level and make arrangements for her to receive the medication every 6 months. Have also recommended that a follow-up bone density study one year after initiating therapy to monitor response to treatment is recommended then every 2 years. They're we also discussed going on a drug holiday after 7 years of therapy.  Greater than 90%, spent counseling coronary care for this patient. Time of consultation 25 minutes

## 2017-04-05 NOTE — Telephone Encounter (Signed)
Pam, please make arrangements for this patient to receive Prolia 60 mg subcutaneous every 6 months. She is having her calcium level and vitamin D level drawn today. Patient knows that if her insurance company does not cover it we will need to call in a prescription for Fosamax 70 mg every weekly. Patient with osteoporosis T score -2.9 right femoral neck. Thank you

## 2017-04-05 NOTE — Patient Instructions (Signed)
Denosumab injection What is this medicine? DENOSUMAB (den oh sue mab) slows bone breakdown. Prolia is used to treat osteoporosis in women after menopause and in men. Delton See is used to treat a high calcium level due to cancer and to prevent bone fractures and other bone problems caused by multiple myeloma or cancer bone metastases. Delton See is also used to treat giant cell tumor of the bone. This medicine may be used for other purposes; ask your health care provider or pharmacist if you have questions. COMMON BRAND NAME(S): Prolia, XGEVA What should I tell my health care provider before I take this medicine? They need to know if you have any of these conditions: -dental disease -having surgery or tooth extraction -infection -kidney disease -low levels of calcium or Vitamin D in the blood -malnutrition -on hemodialysis -skin conditions or sensitivity -thyroid or parathyroid disease -an unusual reaction to denosumab, other medicines, foods, dyes, or preservatives -pregnant or trying to get pregnant -breast-feeding How should I use this medicine? This medicine is for injection under the skin. It is given by a health care professional in a hospital or clinic setting. If you are getting Prolia, a special MedGuide will be given to you by the pharmacist with each prescription and refill. Be sure to read this information carefully each time. For Prolia, talk to your pediatrician regarding the use of this medicine in children. Special care may be needed. For Delton See, talk to your pediatrician regarding the use of this medicine in children. While this drug may be prescribed for children as young as 13 years for selected conditions, precautions do apply. Overdosage: If you think you have taken too much of this medicine contact a poison control center or emergency room at once. NOTE: This medicine is only for you. Do not share this medicine with others. What if I miss a dose? It is important not to miss your  dose. Call your doctor or health care professional if you are unable to keep an appointment. What may interact with this medicine? Do not take this medicine with any of the following medications: -other medicines containing denosumab This medicine may also interact with the following medications: -medicines that lower your chance of fighting infection -steroid medicines like prednisone or cortisone This list may not describe all possible interactions. Give your health care provider a list of all the medicines, herbs, non-prescription drugs, or dietary supplements you use. Also tell them if you smoke, drink alcohol, or use illegal drugs. Some items may interact with your medicine. What should I watch for while using this medicine? Visit your doctor or health care professional for regular checks on your progress. Your doctor or health care professional may order blood tests and other tests to see how you are doing. Call your doctor or health care professional for advice if you get a fever, chills or sore throat, or other symptoms of a cold or flu. Do not treat yourself. This drug may decrease your body's ability to fight infection. Try to avoid being around people who are sick. You should make sure you get enough calcium and vitamin D while you are taking this medicine, unless your doctor tells you not to. Discuss the foods you eat and the vitamins you take with your health care professional. See your dentist regularly. Brush and floss your teeth as directed. Before you have any dental work done, tell your dentist you are receiving this medicine. Do not become pregnant while taking this medicine or for 5 months after stopping  it. Talk with your doctor or health care professional about your birth control options while taking this medicine. Women should inform their doctor if they wish to become pregnant or think they might be pregnant. There is a potential for serious side effects to an unborn child. Talk  to your health care professional or pharmacist for more information. What side effects may I notice from receiving this medicine? Side effects that you should report to your doctor or health care professional as soon as possible: -allergic reactions like skin rash, itching or hives, swelling of the face, lips, or tongue -bone pain -breathing problems -dizziness -jaw pain, especially after dental work -redness, blistering, peeling of the skin -signs and symptoms of infection like fever or chills; cough; sore throat; pain or trouble passing urine -signs of low calcium like fast heartbeat, muscle cramps or muscle pain; pain, tingling, numbness in the hands or feet; seizures -unusual bleeding or bruising -unusually weak or tired Side effects that usually do not require medical attention (report to your doctor or health care professional if they continue or are bothersome): -constipation -diarrhea -headache -joint pain -loss of appetite -muscle pain -runny nose -tiredness -upset stomach This list may not describe all possible side effects. Call your doctor for medical advice about side effects. You may report side effects to FDA at 1-800-FDA-1088. Where should I keep my medicine? This medicine is only given in a clinic, doctor's office, or other health care setting and will not be stored at home. NOTE: This sheet is a summary. It may not cover all possible information. If you have questions about this medicine, talk to your doctor, pharmacist, or health care provider.  2018 Elsevier/Gold Standard (2016-10-19 19:17:21) Osteoporosis Osteoporosis is the thinning and loss of density in the bones. Osteoporosis makes the bones more brittle, fragile, and likely to break (fracture). Over time, osteoporosis can cause the bones to become so weak that they fracture after a simple fall. The bones most likely to fracture are the bones in the hip, wrist, and spine. What are the causes? The exact cause  is not known. What increases the risk? Anyone can develop osteoporosis. You may be at greater risk if you have a family history of the condition or have poor nutrition. You may also have a higher risk if you are:  Female.  60 years old or older.  A smoker.  Not physically active.  White or Asian.  Slender.  What are the signs or symptoms? A fracture might be the first sign of the disease, especially if it results from a fall or injury that would not usually cause a bone to break. Other signs and symptoms include:  Low back and neck pain.  Stooped posture.  Height loss.  How is this diagnosed? To make a diagnosis, your health care provider may:  Take a medical history.  Perform a physical exam.  Order tests, such as: ? A bone mineral density test. ? A dual-energy X-ray absorptiometry test.  How is this treated? The goal of osteoporosis treatment is to strengthen your bones to reduce your risk of a fracture. Treatment may involve:  Making lifestyle changes, such as: ? Eating a diet rich in calcium. ? Doing weight-bearing and muscle-strengthening exercises. ? Stopping tobacco use. ? Limiting alcohol intake.  Taking medicine to slow the process of bone loss or to increase bone density.  Monitoring your levels of calcium and vitamin D.  Follow these instructions at home:  Include calcium and vitamin D in  your diet. Calcium is important for bone health, and vitamin D helps the body absorb calcium.  Perform weight-bearing and muscle-strengthening exercises as directed by your health care provider.  Do not use any tobacco products, including cigarettes, chewing tobacco, and electronic cigarettes. If you need help quitting, ask your health care provider.  Limit your alcohol intake.  Take medicines only as directed by your health care provider.  Keep all follow-up visits as directed by your health care provider. This is important.  Take precautions at home to  lower your risk of falling, such as: ? Keeping rooms well lit and clutter free. ? Installing safety rails on stairs. ? Using rubber mats in the bathroom and other areas that are often wet or slippery. Get help right away if: You fall or injure yourself. This information is not intended to replace advice given to you by your health care provider. Make sure you discuss any questions you have with your health care provider. Document Released: 07/07/2005 Document Revised: 03/01/2016 Document Reviewed: 03/07/2014 Elsevier Interactive Patient Education  2017 [employer].

## 2017-04-06 ENCOUNTER — Telehealth: Payer: Self-pay | Admitting: Family Medicine

## 2017-04-06 LAB — VITAMIN D 25 HYDROXY (VIT D DEFICIENCY, FRACTURES): Vit D, 25-Hydroxy: 40 ng/mL (ref 30–100)

## 2017-04-06 LAB — CALCIUM: Calcium: 10.1 mg/dL (ref 8.6–10.4)

## 2017-04-06 NOTE — Telephone Encounter (Signed)
Patient is aware 

## 2017-04-06 NOTE — Telephone Encounter (Signed)
I think she would be wise to start prolia.  She has very low bone density by recent DEXA and very high risk of fracture.  Most patients have tolerated very well.

## 2017-04-06 NOTE — Telephone Encounter (Signed)
Pt would like dr burchette opinion on taking prolia due to side effects. Pt gyn dr Toney Rakes would like patient to start getting prolia injection due to osteoporosis

## 2017-04-12 IMAGING — MG 2D DIGITAL SCREENING BILATERAL MAMMOGRAM WITH CAD AND ADJUNCT TO
9 of 15 series · 9 of 35 positions shown · non-contrast
Comparison: Previous exam(s).

CLINICAL DATA: Screening.

EXAM:
2D DIGITAL SCREENING BILATERAL MAMMOGRAM WITH CAD AND ADJUNCT TOMO

[R CC]
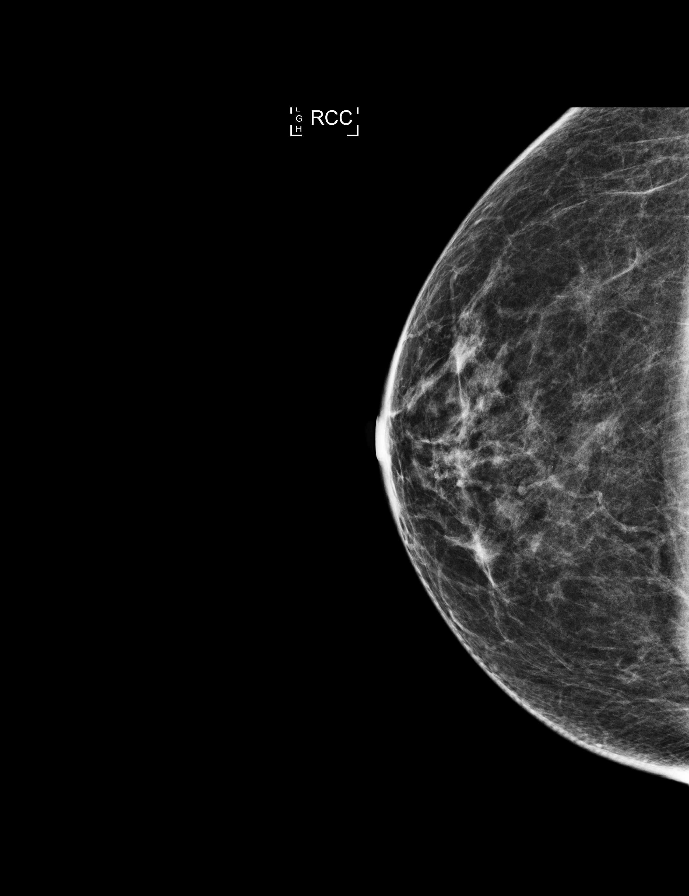

[L CC]
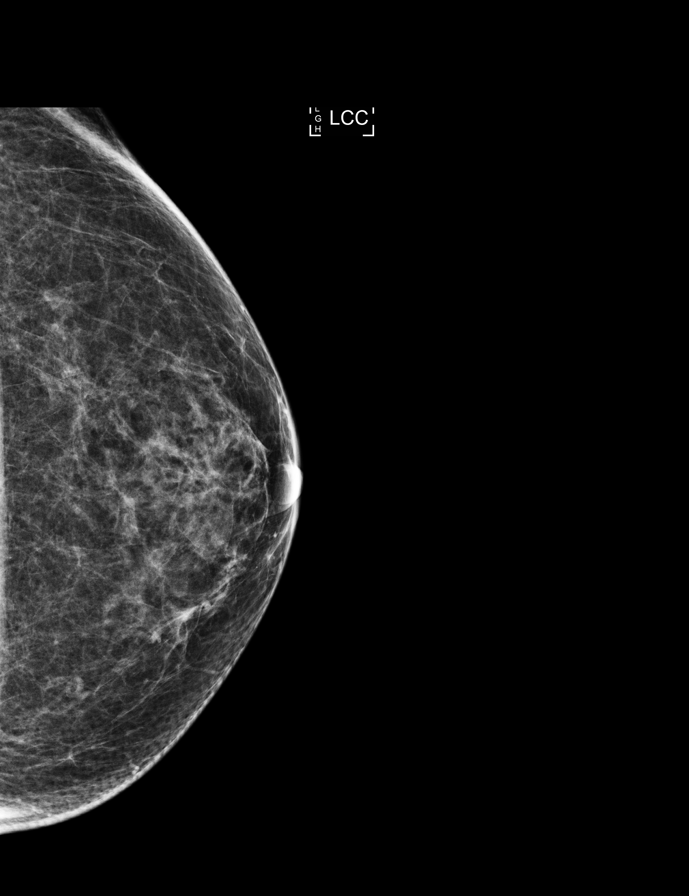

[R MLO synth-2D]
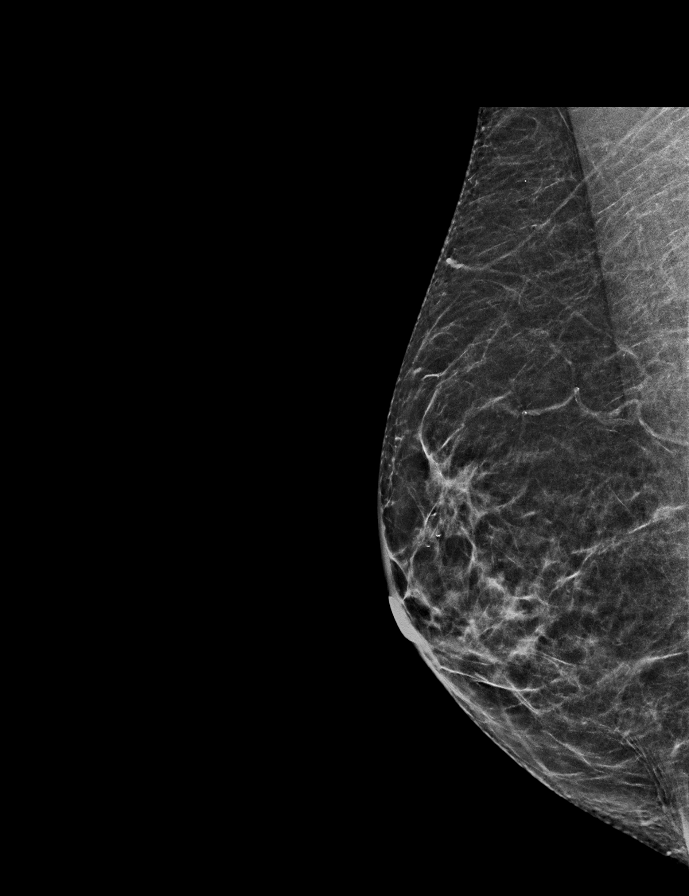

[L MLO (1 of 2)]
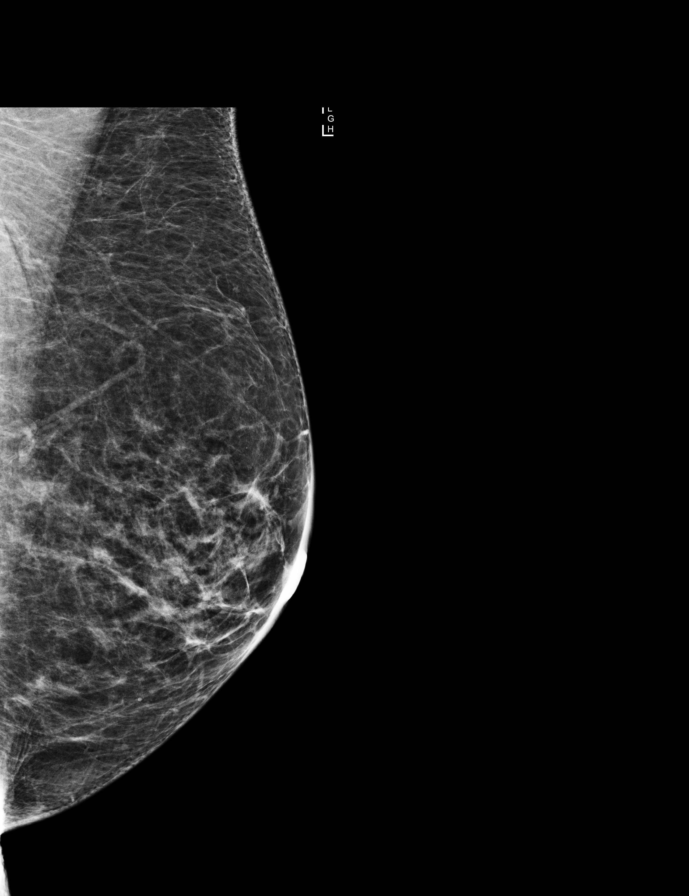

[L CC synth-2D]
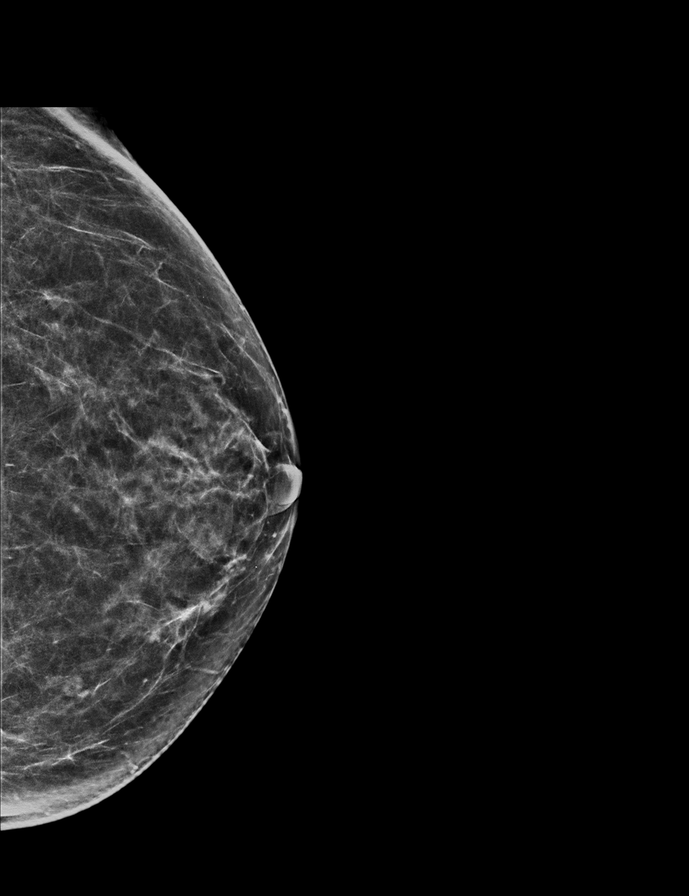

[L MLO (2 of 2)]
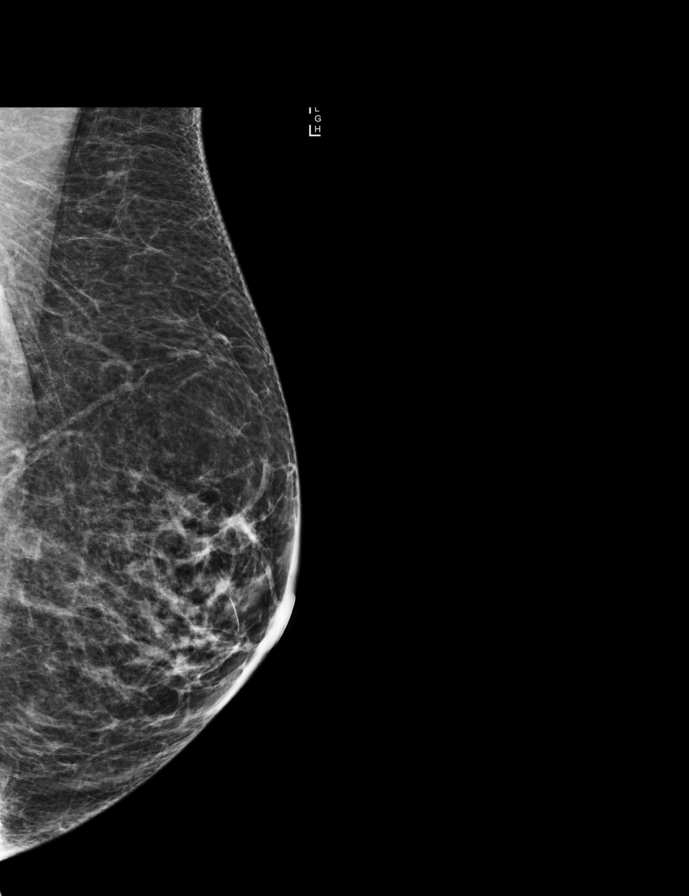

[R MLO]
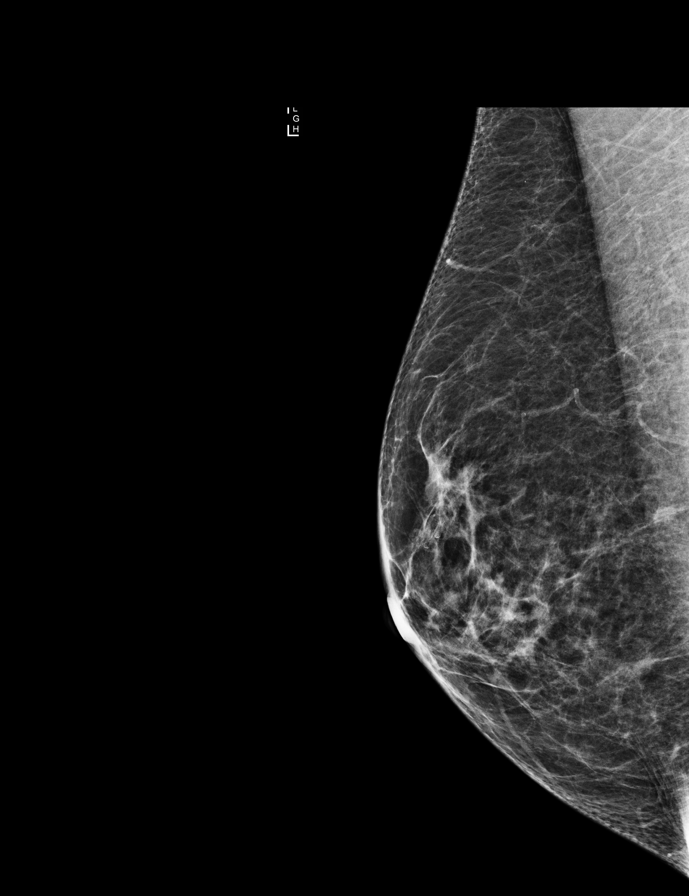

[L MLO synth-2D (1 of 2)]
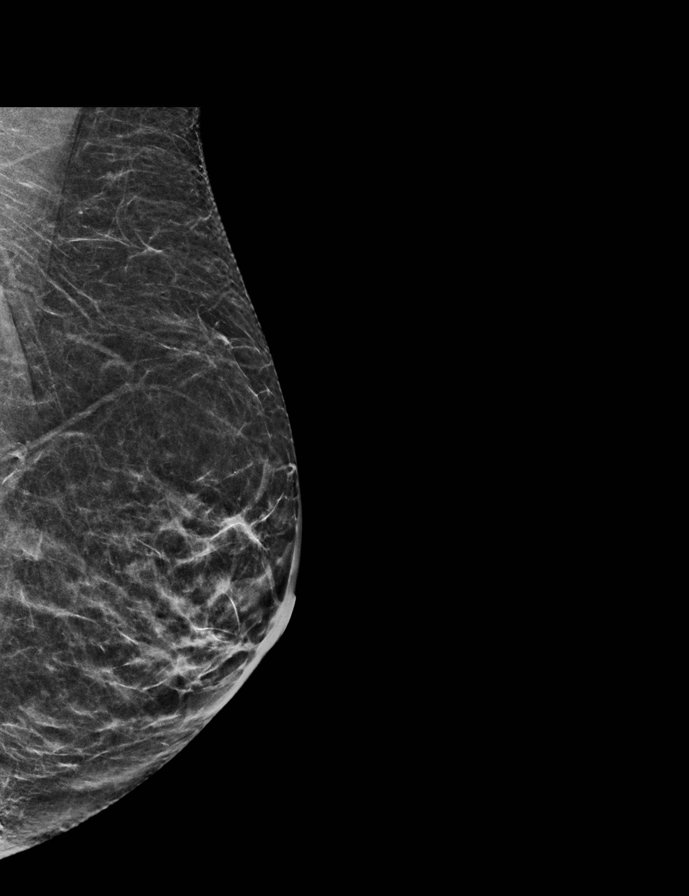

[L MLO synth-2D (2 of 2)]
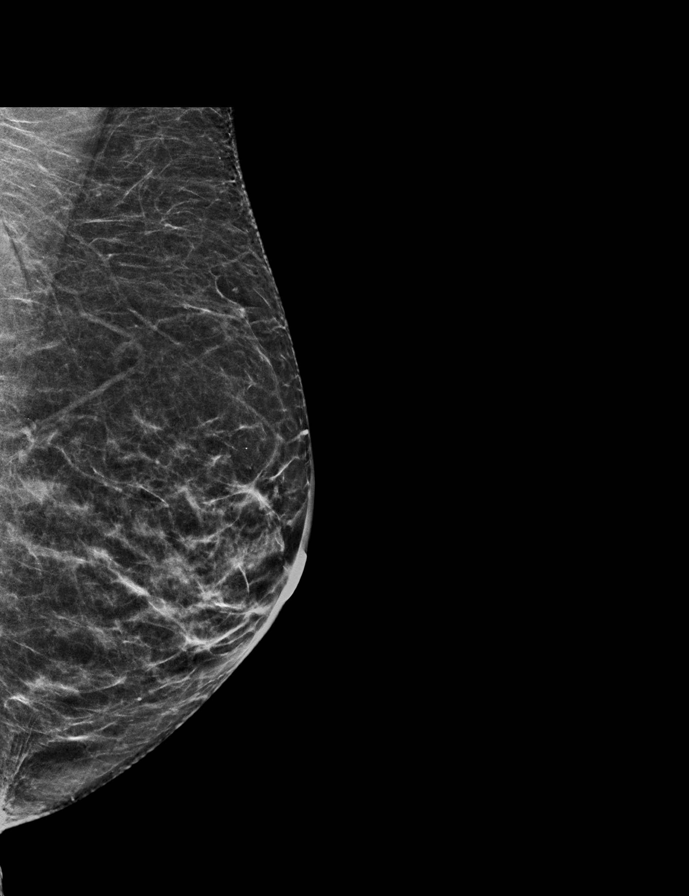

[9 of 35 positions shown; findings below may reference images not displayed]

ACR Breast Density Category b: There are scattered areas of
fibroglandular density.
FINDINGS: In the right breast, possible distortion warrants further
evaluation. In the left breast, no findings suspicious for
malignancy. Images were processed with CAD.
IMPRESSION: Further evaluation is suggested for possible distortion in the right
breast.

RECOMMENDATION:
Diagnostic mammogram and possibly ultrasound of the right breast.
(Code:[id number])

The patient will be contacted regarding the findings, and additional
imaging will be scheduled.

BI-RADS CATEGORY  0: Incomplete. Need additional imaging evaluation
and/or prior mammograms for comparison.

## 2017-04-15 DIAGNOSIS — K045 Chronic apical periodontitis: Secondary | ICD-10-CM | POA: Diagnosis not present

## 2017-04-15 DIAGNOSIS — M272 Inflammatory conditions of jaws: Secondary | ICD-10-CM | POA: Diagnosis not present

## 2017-04-19 NOTE — Telephone Encounter (Signed)
Phone call to pt , she does NOT want Prolia injections due to information from her dentist and the possible side effects that she has read about however she does want her insurance checked for the coverage for Prolia. She also requested results of recent labs calcium 10.1  04/05/17 and vit d 40.0 / Will her back call with insurance benefits.

## 2017-04-26 DIAGNOSIS — R69 Illness, unspecified: Secondary | ICD-10-CM | POA: Diagnosis not present

## 2017-05-17 NOTE — Telephone Encounter (Signed)
PC to pt PA needed for Prolia. Insurance benefit 20% co insurance approx $216.00 (medicare allowable $1064.00). Also with/without OV $40 co pay. PT does not want Prolia at this time and will call back if she changes her mind.

## 2017-05-25 ENCOUNTER — Encounter: Payer: Self-pay | Admitting: Anesthesiology

## 2017-06-09 ENCOUNTER — Other Ambulatory Visit: Payer: Self-pay | Admitting: Family Medicine

## 2017-06-09 DIAGNOSIS — N6489 Other specified disorders of breast: Secondary | ICD-10-CM

## 2017-06-29 DIAGNOSIS — L821 Other seborrheic keratosis: Secondary | ICD-10-CM | POA: Diagnosis not present

## 2017-06-29 DIAGNOSIS — D229 Melanocytic nevi, unspecified: Secondary | ICD-10-CM | POA: Diagnosis not present

## 2017-06-29 DIAGNOSIS — M1711 Unilateral primary osteoarthritis, right knee: Secondary | ICD-10-CM | POA: Diagnosis not present

## 2017-07-04 ENCOUNTER — Ambulatory Visit (INDEPENDENT_AMBULATORY_CARE_PROVIDER_SITE_OTHER): Payer: Medicare HMO

## 2017-07-04 DIAGNOSIS — Z23 Encounter for immunization: Secondary | ICD-10-CM

## 2017-07-05 DIAGNOSIS — R69 Illness, unspecified: Secondary | ICD-10-CM | POA: Diagnosis not present

## 2017-08-02 ENCOUNTER — Ambulatory Visit: Payer: Medicare HMO

## 2017-08-02 ENCOUNTER — Ambulatory Visit
Admission: RE | Admit: 2017-08-02 | Discharge: 2017-08-02 | Disposition: A | Discharge status: Home / Self Care | Payer: Medicare HMO | Source: Ambulatory Visit | Attending: Family Medicine | Admitting: Family Medicine

## 2017-08-02 DIAGNOSIS — N6489 Other specified disorders of breast: Secondary | ICD-10-CM

## 2017-08-02 DIAGNOSIS — R928 Other abnormal and inconclusive findings on diagnostic imaging of breast: Secondary | ICD-10-CM | POA: Diagnosis not present

## 2017-08-02 DIAGNOSIS — R69 Illness, unspecified: Secondary | ICD-10-CM | POA: Diagnosis not present

## 2017-10-11 NOTE — Progress Notes (Addendum)
Subjective:   Megan Reilly is a 77 y.o. female who presents for Medicare Annual (Subsequent) preventive examination.  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   Mammogram 01/2017 and repeated in October;  October report went to GYN but Ms. Hinote will try to get a copy for the office. States there were no further issues.  Bone density 03/2017 - has osteoporosis. Was on Fosamax but Dr. Toney Rakes wanted her to start prolia. Dexa report notes "osteoporosis" but no report Dr. Elease Hashimoto to start fosamax Education provided     IMM  Zoster 07/2012 Educated to check with insurance regarding coverage of Shingles vaccination on Part D or Part B and may have lower co-pay if provided on the Part D side      Objective:     Vitals: BP 130/80   Pulse 67   Ht 5\' 7"  (1.702 m)   Wt 153 lb (69.4 kg)   BMI 23.96 kg/m   Body mass index is 23.96 kg/m.  Advanced Directives 10/12/2017 03/24/2015 02/04/2015 12/09/2013  Does Patient Have a Medical Advance Directive? Yes Yes Yes Patient has advance directive, copy not in chart  Type of Advance Directive - [place];Living will [place];Living will [place];Living will  Does patient want to make changes to medical advance directive? - - No - Patient declined -  Copy of [place] in Chart? - - No - copy requested Copy requested from family  Would patient like information on creating a medical advance directive? - - No - patient declined information -  Pre-existing out of facility DNR order (yellow form or pink MOST form) - - - No    Tobacco Social History   Tobacco Use  Smoking Status Never Smoker  Smokeless  Tobacco Never Used     Counseling given: Yes   Clinical Intake:    Past Medical History:  Diagnosis Date  . Allergy   . Arthritis   . Blood transfusion abn reaction or complication, no procedure mishap    77 years old after tonsilectomy  . Osteopenia   . Osteoporosis 03/2017   Past Surgical History:  Procedure Laterality Date  . ABDOMINAL HYSTERECTOMY     partial 1983  . COLONOSCOPY    . FOOT SURGERY     bunectomy, hammertoe, bil  . TONSILLECTOMY  1945   Family History  Problem Relation Age of Onset  . Heart disease Mother   . Hyperlipidemia Mother   . Stroke Mother   . Heart failure Mother   . Diabetes Father        type ll  . Heart disease Father 33       sudden death age 29  . Hyperlipidemia Father   . Breast cancer Maternal Aunt    Social History   Socioeconomic History  . Marital status: Divorced    Spouse name: Not on file  . Number of children: Not on file  . Years of education: Not on file  . Highest education level: Not on file  Social Needs  . Financial resource strain: Not on file  . Food insecurity - worry: Not  on file  . Food insecurity - inability: Not on file  . Transportation needs - medical: Not on file  . Transportation needs - non-medical: Not on file  Occupational History  . Not on file  Tobacco Use  . Smoking status: Never Smoker  . Smokeless tobacco: Never Used  Substance and Sexual Activity  . Alcohol use: Yes    Alcohol/week: 3.0 oz    Types: 5 Glasses of wine per week    Comment: glass of wine 3-4 times a week  . Drug use: No  . Sexual activity: No    Comment: INTERCOURSE AGE 29, SEXUAL PARTNERS LESS THAN 5  Other Topics Concern  . Not on file  Social History Narrative  . Not on file    Outpatient Encounter Medications as of 10/12/2017  Medication Sig  . alendronate (FOSAMAX) 70 MG tablet Take 1 tablet (70 mg total) by mouth every 7 (seven) days. Take with a full glass of water on an empty stomach.  . calcium carbonate  (OS-CAL) 600 MG TABS Take 600 mg by mouth daily.   . cetirizine (ZYRTEC) 10 MG tablet Take 10 mg by mouth daily.  . Cholecalciferol (VITAMIN D-3) 1000 UNITS CAPS Take by mouth.  . fish oil-omega-3 fatty acids 1000 MG capsule Take 1 g by mouth daily. Take 2 pills daily  . Magnesium 250 MG TABS Take 1 tablet by mouth daily.  . Multiple Vitamin (MULTIVITAMIN WITH MINERALS) TABS tablet Take 1 tablet by mouth every morning.  . Turmeric 1053 MG TABS Take by mouth.   No facility-administered encounter medications on file as of 10/12/2017.     Activities of Daily Living No flowsheet data found.  Patient Care Team: Eulas Post, MD as PCP - General (Family Medicine)  GYN; Dr. Toney Rakes April 2018; Elon Alas following ostopenia    Assessment:   This is a routine wellness examination for [place].  Exercise Activities and Dietary recommendations    Goals    . Patient Stated     To continue to teach and stay active         Fall Risk Fall Risk  10/12/2017 09/29/2016 09/23/2015 09/17/2014 09/17/2014  Falls in the past year? No No No Yes No  Number falls in past yr: - - - 1 -  Injury with Fall? - - - Yes -  Risk for fall due to : - - - History of fall(s) -     Depression Screen PHQ 2/9 Scores 10/12/2017 09/29/2016 09/23/2015 09/17/2014  PHQ - 2 Score 0 0 0 0     Cognitive Function   Ad8 score reviewed for issues:  Issues making decisions:  Less interest in hobbies / activities:  Repeats questions, stories (family complaining):  Trouble using ordinary gadgets (microwave, computer, phone):  Forgets the month or year:   Mismanaging finances:   Remembering appts:  Daily problems with thinking and/or memory: Ad8 score is=0 Still teaches High School  Still engaged in the community  Loves to teach      Immunization History  Administered Date(s) Administered  . Influenza Split 07/22/2011, 08/09/2012  . Influenza, High Dose Seasonal PF 07/24/2015, 07/15/2016, 07/04/2017   . Influenza,inj,Quad PF,6+ Mos 06/26/2013, 07/15/2014  . PPD Test 01/19/2013  . Pneumococcal Conjugate-13 10/12/2007, 09/18/2013  . Pneumococcal Polysaccharide-23 09/29/2016  . Tdap 02/02/2011  . Zoster 07/11/2012    Screening Tests Health Maintenance  Topic Date Due  . TETANUS/TDAP  02/01/2021  . INFLUENZA VACCINE  Completed  . DEXA  SCAN  Completed  . PNA vac Low Risk Adult  Completed        Plan:      PCP Notes  Health Maintenance Up to date Was told she has osteoporosis but did not have complete report. Starting fosamax and referred to the osteoporosis foundation for more information on meds etc.   Also states she will have her mammogram report from October sent to the office   Abnormal Screens  None   Referrals  As noted   Patient concerns; As noted   Nurse Concerns; As noted   Next PCP apt Today       I have personally reviewed and noted the following in the patient's chart:   . Medical and social history . Use of alcohol, tobacco or illicit drugs  . Current medications and supplements . Functional ability and status . Nutritional status . Physical activity . Advanced directives . List of other physicians . Hospitalizations, surgeries, and ER visits in previous 12 months . Vitals . Screenings to include cognitive, depression, and falls . Referrals and appointments  In addition, I have reviewed and discussed with patient certain preventive protocols, quality metrics, and best practice recommendations. A written personalized care plan for preventive services as well as general preventive health recommendations were provided to patient.     ZHYQM,VHQIO, RN  10/12/2017  Above reviewed and agree.  We discussed treatment options for osteoporosis in some detail.  She will try Fosamax 70 mg once weekly.  Reviewed possible side effects.  Eulas Post MD [facility]

## 2017-10-12 ENCOUNTER — Other Ambulatory Visit: Payer: Self-pay | Admitting: Women's Health

## 2017-10-12 ENCOUNTER — Ambulatory Visit: Payer: Medicare HMO

## 2017-10-12 ENCOUNTER — Ambulatory Visit (INDEPENDENT_AMBULATORY_CARE_PROVIDER_SITE_OTHER): Payer: Medicare HMO | Admitting: Family Medicine

## 2017-10-12 ENCOUNTER — Encounter: Payer: Self-pay | Admitting: Family Medicine

## 2017-10-12 ENCOUNTER — Telehealth: Payer: Self-pay | Admitting: *Deleted

## 2017-10-12 VITALS — BP 130/80 | HR 67 | Temp 98.7°F | Ht 67.0 in | Wt 153.9 lb

## 2017-10-12 VITALS — BP 130/80 | HR 67 | Ht 67.0 in | Wt 153.0 lb

## 2017-10-12 DIAGNOSIS — Z0000 Encounter for general adult medical examination without abnormal findings: Secondary | ICD-10-CM

## 2017-10-12 LAB — HEPATIC FUNCTION PANEL
ALBUMIN: 4.3 g/dL (ref 3.5–5.2)
ALK PHOS: 72 U/L (ref 39–117)
ALT: 12 U/L (ref 0–35)
AST: 17 U/L (ref 0–37)
BILIRUBIN DIRECT: 0.1 mg/dL (ref 0.0–0.3)
TOTAL PROTEIN: 6.3 g/dL (ref 6.0–8.3)
Total Bilirubin: 0.7 mg/dL (ref 0.2–1.2)

## 2017-10-12 LAB — BASIC METABOLIC PANEL
BUN: 17 mg/dL (ref 6–23)
CHLORIDE: 105 meq/L (ref 96–112)
CO2: 27 meq/L (ref 19–32)
CREATININE: 0.56 mg/dL (ref 0.40–1.20)
Calcium: 9.9 mg/dL (ref 8.4–10.5)
GFR: 111.63 mL/min (ref >60.00)
Glucose, Bld: 95 mg/dL (ref 70–99)
POTASSIUM: 4.1 meq/L (ref 3.5–5.1)
Sodium: 141 mEq/L (ref 135–145)

## 2017-10-12 LAB — CBC WITH DIFFERENTIAL/PLATELET
BASOS ABS: 0 10*3/uL (ref 0.0–0.1)
Basophils Relative: 0.5 % (ref 0.0–3.0)
EOS PCT: 1.8 % (ref 0.0–5.0)
Eosinophils Absolute: 0.1 10*3/uL (ref 0.0–0.7)
HEMATOCRIT: 42.1 % (ref 36.0–46.0)
Hemoglobin: 14.1 g/dL (ref 12.0–15.0)
LYMPHS ABS: 1.5 10*3/uL (ref 0.7–4.0)
Lymphocytes Relative: 21.9 % (ref 12.0–46.0)
MCHC: 33.4 g/dL (ref 30.0–36.0)
MCV: 97.8 fl (ref 78.0–100.0)
MONOS PCT: 7.7 % (ref 3.0–12.0)
Monocytes Absolute: 0.5 10*3/uL (ref 0.1–1.0)
NEUTROS ABS: 4.7 10*3/uL (ref 1.4–7.7)
Neutrophils Relative %: 68.1 % (ref 43.0–77.0)
PLATELETS: 210 10*3/uL (ref 150.0–400.0)
RBC: 4.31 Mil/uL (ref 3.87–5.11)
RDW: 12.7 % (ref 11.5–15.5)
WBC: 6.9 10*3/uL (ref 4.0–10.5)

## 2017-10-12 LAB — LIPID PANEL
CHOL/HDL RATIO: 3
CHOLESTEROL: 188 mg/dL (ref 0–200)
HDL: 56 mg/dL (ref >39.00)
LDL Cholesterol: 110 mg/dL — ABNORMAL HIGH (ref 0–99)
NonHDL: 131.82
TRIGLYCERIDES: 109 mg/dL (ref 0.0–149.0)
VLDL: 21.8 mg/dL (ref 0.0–40.0)

## 2017-10-12 LAB — TSH: TSH: 1.8 u[IU]/mL (ref 0.35–4.50)

## 2017-10-12 MED ORDER — ALENDRONATE SODIUM 70 MG PO TABS: 70.0000 mg | ORAL_TABLET | ORAL | 11 refills | Status: DC

## 2017-10-12 NOTE — Telephone Encounter (Signed)
Telephone call, reviewed DEXA, was seen at her primary care Dr. Elease Hashimoto at [facility] today who recommended Fosamax for worsening Osteoporosis, Dr. Toney Rakes had recommended Prolia does not want to do. Reviewed Fosamax side effects of reflux, GERD, reviewed importance of taking first thing in the morning with a full glass of water, staying upright and no food for at least 30 minutes.  Importance to continue regular exercise, encouraged or tai chi. Yoga. Home safety and fall prevention discussed.  Anderson Malta, could you please send a DEXA report to Dr. Elease Hashimoto, I could not figure out how to send that report, he was unable to pull up at office appointment today. thanks

## 2017-10-12 NOTE — Progress Notes (Signed)
Subjective:     Patient ID: Megan Reilly, female   DOB: December 13, 1940, 77 y.o.   MRN: [id number]  HPI Patient is here today for physical exam and also for Medicare wellness visit. She has history of osteoarthritis right knee which has been progressive past year. Starting to limit her more functionally. She has seen orthopedist. She had steroid injections which helped slightly. She is reluctant to have knee replacement surgery this time.  She sees GYN. She had bone density last year which showed osteoporosis. There had been discussions of Prolia injection which she declined. She's never tried Fosamax. No history of GERD.  She is considering new shingles vaccine. Other immunizations up-to-date. Mammogram up-to-date. Colonoscopy up-to-date. Patient has never smoked Stays very active and exercises fairly regularly -though limited some recently by knee.    Past Medical History:  Diagnosis Date  . Allergy   . Arthritis   . Blood transfusion abn reaction or complication, no procedure mishap    77 years old after tonsilectomy  . Osteopenia   . Osteoporosis 03/2017   Past Surgical History:  Procedure Laterality Date  . ABDOMINAL HYSTERECTOMY     partial 1983  . COLONOSCOPY    . FOOT SURGERY     bunectomy, hammertoe, bil  . TONSILLECTOMY  1945    reports that  has never smoked. she has never used smokeless tobacco. She reports that she drinks about 3.0 oz of alcohol per week. She reports that she does not use drugs. family history includes Breast cancer in her maternal aunt; Diabetes in her father; Heart disease in her mother; Heart disease (age of onset: 2) in her father; Heart failure in her mother; Hyperlipidemia in her father and mother; Stroke in her mother. No Known Allergies   Review of Systems  Constitutional: Negative for activity change, appetite change, fatigue, fever and unexpected weight change.  HENT: Negative for ear pain, hearing loss, sore throat and trouble swallowing.    Eyes: Negative for visual disturbance.  Respiratory: Negative for cough and shortness of breath.   Cardiovascular: Negative for chest pain and palpitations.  Gastrointestinal: Negative for abdominal pain, blood in stool, constipation and diarrhea.  Genitourinary: Negative for dysuria and hematuria.  Musculoskeletal: Positive for arthralgias. Negative for back pain and myalgias.  Skin: Negative for rash.  Neurological: Negative for dizziness, syncope and headaches.  Hematological: Negative for adenopathy.  Psychiatric/Behavioral: Negative for confusion and dysphoric mood.       Objective:   Physical Exam  Constitutional: She is oriented to person, place, and time. She appears well-developed and well-nourished.  HENT:  Head: Normocephalic and atraumatic.  Eyes: EOM are normal. Pupils are equal, round, and reactive to light.  Neck: Normal range of motion. Neck supple. No thyromegaly present.  Cardiovascular: Normal rate, regular rhythm and normal heart sounds.  No murmur heard. Pulmonary/Chest: Breath sounds normal. No respiratory distress. She has no wheezes. She has no rales.  Abdominal: Soft. Bowel sounds are normal. She exhibits no distension and no mass. There is no tenderness. There is no rebound and no guarding.  Genitourinary:  Genitourinary Comments: Per gyn  Musculoskeletal: Normal range of motion. She exhibits no edema.  Lymphadenopathy:    She has no cervical adenopathy.  Neurological: She is alert and oriented to person, place, and time. She displays normal reflexes. No cranial nerve deficit.  Skin: No rash noted.  Psychiatric: She has a normal mood and affect. Her behavior is normal. Judgment and thought content normal.  Assessment:     Physical exam. The following issues were discussed.    Plan:     -Discussed strengthening exercises for quadriceps especially given her progressive right knee arthritis -Discussed treatments for osteoporosis. Recommended  continued regular weightbearing exercise and daily calcium and vitamin D. She had recent vitamin D level which was 40 -Discussed possible treatments for osteoporosis. She is willing to try Fosamax. She has no history of reflux or any contraindications. Start 70 mg once weekly. We reviewed potential side effects -Consider new shingles vaccine and she will check on coverage -Obtain screening labs today  Eulas Post MD [facility] at [facility]

## 2017-10-12 NOTE — Telephone Encounter (Signed)
Pt called asking to personally speak with you, asked if you would call her after 4pm [phone]

## 2017-10-12 NOTE — Patient Instructions (Signed)
Megan Reilly , Thank you for taking time to come for your Medicare Wellness Visit. I appreciate your ongoing commitment to your health goals. Please review the following plan we discussed and let me know if I can assist you in the future.   Recommendations for Dexa Scan Female over the age of 10 Man age 77 or older If you broke a bone past the age of 68 Women menopausal age with risk factors (thin frame; smoker; hx of fx ) Post menopausal women under the age of 59 with risk factors A man age 74 to 85 with risk factors Other: Spine xray that is showing break of bone loss Back pain with possible break Height loss of 1/2 inch or more within one year Total loss in height of 1.5 inches from your original height  Calcium 1254m with Vit D 800u per day; more as directed by physician Strength building exercises discussed; can include walking; housework; small weights or stretch bands; silver sneakers if access to the [facility]  Please visit the osteoporosis [email or web address] for up to date recommendations    Will attempt to have your October mammogram report sent here. Office can fax to [phone] to Dr. BElease Hashimoto I added a goal below as we discussed.   These are the goals we discussed: Goals    . Patient Stated     To continue to teach and stay active         This is a list of the screening recommended for you and due dates:  Health Maintenance  Topic Date Due  . Tetanus Vaccine  02/01/2021  . Flu Shot  Completed  . DEXA scan (bone density measurement)  Completed  . Pneumonia vaccines  Completed   Health Maintenance, Female Adopting a healthy lifestyle and getting preventive care can go a long way to promote health and wellness. Talk with your health care provider about what schedule of regular examinations is right for you. This is a good chance for you to check in with your provider about disease prevention and staying healthy. In between checkups, there are plenty of things you can do  on your own. Experts have done a lot of research about which lifestyle changes and preventive measures are most likely to keep you healthy. Ask your health care provider for more information. Weight and diet Eat a healthy diet  Be sure to include plenty of vegetables, fruits, low-fat dairy products, and lean protein.  Do not eat a lot of foods high in solid fats, added sugars, or salt.  Get regular exercise. This is one of the most important things you can do for your health. ? Most adults should exercise for at least 150 minutes each week. The exercise should increase your heart rate and make you sweat (moderate-intensity exercise). ? Most adults should also do strengthening exercises at least twice a week. This is in addition to the moderate-intensity exercise.  Maintain a healthy weight  Body mass index (BMI) is a measurement that can be used to identify possible weight problems. It estimates body fat based on height and weight. Your health care provider can help determine your BMI and help you achieve or maintain a healthy weight.  For females 27years of age and older: ? A BMI below 18.5 is considered underweight. ? A BMI of 18.5 to 24.9 is normal. ? A BMI of 25 to 29.9 is considered overweight. ? A BMI of 30 and above is considered obese.  Watch levels of  cholesterol and blood lipids  You should start having your blood tested for lipids and cholesterol at 77 years of age, then have this test every 5 years.  You may need to have your cholesterol levels checked more often if: ? Your lipid or cholesterol levels are high. ? You are older than 77 years of age. ? You are at high risk for heart disease.  Cancer screening Lung Cancer  Lung cancer screening is recommended for adults 8-77 years old who are at high risk for lung cancer because of a history of smoking.  A yearly low-dose CT scan of the lungs is recommended for people who: ? Currently smoke. ? Have quit within the  past 15 years. ? Have at least a 30-pack-year history of smoking. A pack year is smoking an average of one pack of cigarettes a day for 1 year.  Yearly screening should continue until it has been 15 years since you quit.  Yearly screening should stop if you develop a health problem that would prevent you from having lung cancer treatment.  Breast Cancer  Practice breast self-awareness. This means understanding how your breasts normally appear and feel.  It also means doing regular breast self-exams. Let your health care provider know about any changes, no matter how small.  If you are in your 20s or 30s, you should have a clinical breast exam (CBE) by a health care provider every 1-3 years as part of a regular health exam.  If you are 95 or older, have a CBE every year. Also consider having a breast X-ray (mammogram) every year.  If you have a family history of breast cancer, talk to your health care provider about genetic screening.  If you are at high risk for breast cancer, talk to your health care provider about having an MRI and a mammogram every year.  Breast cancer gene (BRCA) assessment is recommended for women who have family members with BRCA-related cancers. BRCA-related cancers include: ? Breast. ? Ovarian. ? Tubal. ? Peritoneal cancers.  Results of the assessment will determine the need for genetic counseling and BRCA1 and BRCA2 testing.  Cervical Cancer Your health care provider may recommend that you be screened regularly for cancer of the pelvic organs (ovaries, uterus, and vagina). This screening involves a pelvic examination, including checking for microscopic changes to the surface of your cervix (Pap test). You may be encouraged to have this screening done every 3 years, beginning at age 84.  For women ages 32-65, health care providers may recommend pelvic exams and Pap testing every 3 years, or they may recommend the Pap and pelvic exam, combined with testing for  human papilloma virus (HPV), every 5 years. Some types of HPV increase your risk of cervical cancer. Testing for HPV may also be done on women of any age with unclear Pap test results.  Other health care providers may not recommend any screening for nonpregnant women who are considered low risk for pelvic cancer and who do not have symptoms. Ask your health care provider if a screening pelvic exam is right for you.  If you have had past treatment for cervical cancer or a condition that could lead to cancer, you need Pap tests and screening for cancer for at least 20 years after your treatment. If Pap tests have been discontinued, your risk factors (such as having a new sexual partner) need to be reassessed to determine if screening should resume. Some women have medical problems that increase the chance of  getting cervical cancer. In these cases, your health care provider may recommend more frequent screening and Pap tests.  Colorectal Cancer  This type of cancer can be detected and often prevented.  Routine colorectal cancer screening usually begins at 77 years of age and continues through 77 years of age.  Your health care provider may recommend screening at an earlier age if you have risk factors for colon cancer.  Your health care provider may also recommend using home test kits to check for hidden blood in the stool.  A small camera at the end of a tube can be used to examine your colon directly (sigmoidoscopy or colonoscopy). This is done to check for the earliest forms of colorectal cancer.  Routine screening usually begins at age 71.  Direct examination of the colon should be repeated every 5-10 years through 77 years of age. However, you may need to be screened more often if early forms of precancerous polyps or small growths are found.  Skin Cancer  Check your skin from head to toe regularly.  Tell your health care provider about any new moles or changes in moles, especially if  there is a change in a mole's shape or color.  Also tell your health care provider if you have a mole that is larger than the size of a pencil eraser.  Always use sunscreen. Apply sunscreen liberally and repeatedly throughout the day.  Protect yourself by wearing long sleeves, pants, a wide-brimmed hat, and sunglasses whenever you are outside.  Heart disease, diabetes, and high blood pressure  High blood pressure causes heart disease and increases the risk of stroke. High blood pressure is more likely to develop in: ? People who have blood pressure in the high end of the normal range (130-139/85-89 mm Hg). ? People who are overweight or obese. ? People who are African American.  If you are 44-19 years of age, have your blood pressure checked every 3-5 years. If you are 4 years of age or older, have your blood pressure checked every year. You should have your blood pressure measured twice-once when you are at a hospital or clinic, and once when you are not at a hospital or clinic. Record the average of the two measurements. To check your blood pressure when you are not at a hospital or clinic, you can use: ? An automated blood pressure machine at a pharmacy. ? A home blood pressure monitor.  If you are between 80 years and 5 years old, ask your health care provider if you should take aspirin to prevent strokes.  Have regular diabetes screenings. This involves taking a blood sample to check your fasting blood sugar level. ? If you are at a normal weight and have a low risk for diabetes, have this test once every three years after 77 years of age. ? If you are overweight and have a high risk for diabetes, consider being tested at a younger age or more often. Preventing infection Hepatitis B  If you have a higher risk for hepatitis B, you should be screened for this virus. You are considered at high risk for hepatitis B if: ? You were born in a country where hepatitis B is common. Ask your  health care provider which countries are considered high risk. ? Your parents were born in a high-risk country, and you have not been immunized against hepatitis B (hepatitis B vaccine). ? You have HIV or AIDS. ? You use needles to inject street drugs. ? You  live with someone who has hepatitis B. ? You have had sex with someone who has hepatitis B. ? You get hemodialysis treatment. ? You take certain medicines for conditions, including cancer, organ transplantation, and autoimmune conditions.  Hepatitis C  Blood testing is recommended for: ? Everyone born from [birth year] through 1965. ? Anyone with known risk factors for hepatitis C.  Sexually transmitted infections (STIs)  You should be screened for sexually transmitted infections (STIs) including gonorrhea and chlamydia if: ? You are sexually active and are younger than 77 years of age. ? You are older than 77 years of age and your health care provider tells you that you are at risk for this type of infection. ? Your sexual activity has changed since you were last screened and you are at an increased risk for chlamydia or gonorrhea. Ask your health care provider if you are at risk.  If you do not have HIV, but are at risk, it may be recommended that you take a prescription medicine daily to prevent HIV infection. This is called pre-exposure prophylaxis (PrEP). You are considered at risk if: ? You are sexually active and do not regularly use condoms or know the HIV status of your partner(s). ? You take drugs by injection. ? You are sexually active with a partner who has HIV.  Talk with your health care provider about whether you are at high risk of being infected with HIV. If you choose to begin PrEP, you should first be tested for HIV. You should then be tested every 3 months for as long as you are taking PrEP. Pregnancy  If you are premenopausal and you may become pregnant, ask your health care provider about preconception  counseling.  If you may become pregnant, take 400 to 800 micrograms (mcg) of folic acid every day.  If you want to prevent pregnancy, talk to your health care provider about birth control (contraception). Osteoporosis and menopause  Osteoporosis is a disease in which the bones lose minerals and strength with aging. This can result in serious bone fractures. Your risk for osteoporosis can be identified using a bone density scan.  If you are 68 years of age or older, or if you are at risk for osteoporosis and fractures, ask your health care provider if you should be screened.  Ask your health care provider whether you should take a calcium or vitamin D supplement to lower your risk for osteoporosis.  Menopause may have certain physical symptoms and risks.  Hormone replacement therapy may reduce some of these symptoms and risks. Talk to your health care provider about whether hormone replacement therapy is right for you. Follow these instructions at home:  Schedule regular health, dental, and eye exams.  Stay current with your immunizations.  Do not use any tobacco products including cigarettes, chewing tobacco, or electronic cigarettes.  If you are pregnant, do not drink alcohol.  If you are breastfeeding, limit how much and how often you drink alcohol.  Limit alcohol intake to no more than 1 drink per day for nonpregnant women. One drink equals 12 ounces of beer, 5 ounces of wine, or 1 ounces of hard liquor.  Do not use street drugs.  Do not share needles.  Ask your health care provider for help if you need support or information about quitting drugs.  Tell your health care provider if you often feel depressed.  Tell your health care provider if you have ever been abused or do not feel safe at home.  This information is not intended to replace advice given to you by your health care provider. Make sure you discuss any questions you have with your health care provider. Document  Released: 04/12/2011 Document Revised: 03/04/2016 Document Reviewed: 07/01/2015 Elsevier Interactive Patient Education  2018 [place] in the Home Falls can cause injuries and can affect people from all age groups. There are many simple things that you can do to make your home safe and to help prevent falls. What can I do on the outside of my home?  Regularly repair the edges of walkways and driveways and fix any cracks.  Remove high doorway thresholds.  Trim any shrubbery on the main path into your home.  Use bright outdoor lighting.  Clear walkways of debris and clutter, including tools and rocks.  Regularly check that handrails are securely fastened and in good repair. Both sides of any steps should have handrails.  Install guardrails along the edges of any raised decks or porches.  Have leaves, snow, and ice cleared regularly.  Use sand or salt on walkways during winter months.  In the garage, clean up any spills right away, including grease or oil spills. What can I do in the bathroom?  Use night lights.  Install grab bars by the toilet and in the tub and shower. Do not use towel bars as grab bars.  Use non-skid mats or decals on the floor of the tub or shower.  If you need to sit down while you are in the shower, use a plastic, non-slip stool.  Keep the floor dry. Immediately clean up any water that spills on the floor.  Remove soap buildup in the tub or shower on a regular basis.  Attach bath mats securely with double-sided non-slip rug tape.  Remove throw rugs and other tripping hazards from the floor. What can I do in the bedroom?  Use night lights.  Make sure that a bedside light is easy to reach.  Do not use oversized bedding that drapes onto the floor.  Have a firm chair that has side arms to use for getting dressed.  Remove throw rugs and other tripping hazards from the floor. What can I do in the kitchen?  Clean up any  spills right away.  Avoid walking on wet floors.  Place frequently used items in easy-to-reach places.  If you need to reach for something above you, use a sturdy step stool that has a grab bar.  Keep electrical cables out of the way.  Do not use floor polish or wax that makes floors slippery. If you have to use wax, make sure that it is non-skid floor wax.  Remove throw rugs and other tripping hazards from the floor. What can I do in the stairways?  Do not leave any items on the stairs.  Make sure that there are handrails on both sides of the stairs. Fix handrails that are broken or loose. Make sure that handrails are as long as the stairways.  Check any carpeting to make sure that it is firmly attached to the stairs. Fix any carpet that is loose or worn.  Avoid having throw rugs at the top or bottom of stairways, or secure the rugs with carpet tape to prevent them from moving.  Make sure that you have a light switch at the top of the stairs and the bottom of the stairs. If you do not have them, have them installed. What are some other fall prevention tips?  Wear closed-toe shoes that fit well and support your feet. Wear shoes that have rubber soles or low heels.  When you use a stepladder, make sure that it is completely opened and that the sides are firmly locked. Have someone hold the ladder while you are using it. Do not climb a closed stepladder.  Add color or contrast paint or tape to grab bars and handrails in your home. Place contrasting color strips on the first and last steps.  Use mobility aids as needed, such as canes, walkers, scooters, and crutches.  Turn on lights if it is dark. Replace any light bulbs that burn out.  Set up furniture so that there are clear paths. Keep the furniture in the same spot.  Fix any uneven floor surfaces.  Choose a carpet design that does not hide the edge of steps of a stairway.  Be aware of any and all pets.  Review your  medicines with your healthcare provider. Some medicines can cause dizziness or changes in blood pressure, which increase your risk of falling. Talk with your health care provider about other ways that you can decrease your risk of falls. This may include working with a physical therapist or trainer to improve your strength, balance, and endurance. This information is not intended to replace advice given to you by your health care provider. Make sure you discuss any questions you have with your health care provider. Document Released: 09/17/2002 Document Revised: 02/24/2016 Document Reviewed: 11/01/2014 Elsevier Interactive Patient Education  [employer].

## 2017-10-12 NOTE — Patient Instructions (Signed)
Osteoporosis Osteoporosis is the thinning and loss of density in the bones. Osteoporosis makes the bones more brittle, fragile, and likely to break (fracture). Over time, osteoporosis can cause the bones to become so weak that they fracture after a simple fall. The bones most likely to fracture are the bones in the hip, wrist, and spine. What are the causes? The exact cause is not known. What increases the risk? Anyone can develop osteoporosis. You may be at greater risk if you have a family history of the condition or have poor nutrition. You may also have a higher risk if you are:  Female.  50 years old or older.  A smoker.  Not physically active.  White or Asian.  Slender.  What are the signs or symptoms? A fracture might be the first sign of the disease, especially if it results from a fall or injury that would not usually cause a bone to break. Other signs and symptoms include:  Low back and neck pain.  Stooped posture.  Height loss.  How is this diagnosed? To make a diagnosis, your health care provider may:  Take a medical history.  Perform a physical exam.  Order tests, such as: ? A bone mineral density test. ? A dual-energy X-ray absorptiometry test.  How is this treated? The goal of osteoporosis treatment is to strengthen your bones to reduce your risk of a fracture. Treatment may involve:  Making lifestyle changes, such as: ? Eating a diet rich in calcium. ? Doing weight-bearing and muscle-strengthening exercises. ? Stopping tobacco use. ? Limiting alcohol intake.  Taking medicine to slow the process of bone loss or to increase bone density.  Monitoring your levels of calcium and vitamin D.  Follow these instructions at home:  Include calcium and vitamin D in your diet. Calcium is important for bone health, and vitamin D helps the body absorb calcium.  Perform weight-bearing and muscle-strengthening exercises as directed by your health care  provider.  Do not use any tobacco products, including cigarettes, chewing tobacco, and electronic cigarettes. If you need help quitting, ask your health care provider.  Limit your alcohol intake.  Take medicines only as directed by your health care provider.  Keep all follow-up visits as directed by your health care provider. This is important.  Take precautions at home to lower your risk of falling, such as: ? Keeping rooms well lit and clutter free. ? Installing safety rails on stairs. ? Using rubber mats in the bathroom and other areas that are often wet or slippery. Get help right away if: You fall or injure yourself. This information is not intended to replace advice given to you by your health care provider. Make sure you discuss any questions you have with your health care provider. Document Released: 07/07/2005 Document Revised: 03/01/2016 Document Reviewed: 03/07/2014 Elsevier Interactive Patient Education  2018 [employer]  

## 2017-10-13 ENCOUNTER — Telehealth: Payer: Self-pay | Admitting: Family Medicine

## 2017-10-13 NOTE — Telephone Encounter (Signed)
Patient is aware that her calcium levels were normal and she will begin her fosamax this week

## 2017-10-13 NOTE — Telephone Encounter (Signed)
Copied from [place] [id number]. Topic: General - Other >> Oct 13, 2017  3:43 PM Cecelia Byars, NT wrote: Reason for CRM: Patient called and wants a return call from [facility] concerning her calcium levels are ok to take her new medication  [phone]

## 2017-12-19 ENCOUNTER — Telehealth: Payer: Self-pay | Admitting: Family Medicine

## 2017-12-19 NOTE — Telephone Encounter (Signed)
Pt called back and given information per Dr Elease Hashimoto; however the pt would like to know if not presribing mean that the Dr does not endorse this product?she states that a message can be left at on her voice mail [phone]; will route to office for provider review.

## 2017-12-19 NOTE — Telephone Encounter (Signed)
Copied from [place] [phone]. Topic: Quick Communication - See Telephone Encounter >> Dec 19, 2017  3:43 PM Lamarr Lulas, CMA wrote: CRM for notification. See Telephone encounter for: follow up call  12/19/17. >> Dec 19, 2017  5:37 PM Neva Seat wrote: Pt called back after hours.  Pt said to leave a voicemail since there is phone tag.

## 2017-12-19 NOTE — Telephone Encounter (Signed)
Left message on machine for patient to return our call requesting more information from patient about CBD.  Dr Elease Hashimoto does not prescribe this.  CRM created

## 2017-12-19 NOTE — Telephone Encounter (Signed)
Left message on machine for patient to return our call.  CRM created 

## 2017-12-19 NOTE — Telephone Encounter (Signed)
Copied from [place] [phone]. Topic: Quick Communication - See Telephone Encounter >> Dec 19, 2017 11:04 AM Lolita Rieger, RMA wrote: CRM for notification. See Telephone encounter for:   12/19/17.pt would like a call back to discuss the use of CBD hemp products please call pt [phone]

## 2017-12-19 NOTE — Telephone Encounter (Signed)
CBD oil is not highly regulated and at this point I feel too little is known to endorse for most medical conditions.

## 2017-12-20 ENCOUNTER — Other Ambulatory Visit: Payer: Self-pay | Admitting: Family Medicine

## 2017-12-20 DIAGNOSIS — N6489 Other specified disorders of breast: Secondary | ICD-10-CM

## 2017-12-20 NOTE — Telephone Encounter (Signed)
Patient is aware 

## 2017-12-21 DIAGNOSIS — M170 Bilateral primary osteoarthritis of knee: Secondary | ICD-10-CM | POA: Diagnosis not present

## 2017-12-21 DIAGNOSIS — M1711 Unilateral primary osteoarthritis, right knee: Secondary | ICD-10-CM | POA: Diagnosis not present

## 2018-01-10 DIAGNOSIS — R69 Illness, unspecified: Secondary | ICD-10-CM | POA: Diagnosis not present

## 2018-01-12 ENCOUNTER — Ambulatory Visit
Admission: RE | Admit: 2018-01-12 | Discharge: 2018-01-12 | Disposition: A | Discharge status: Home / Self Care | Payer: Medicare HMO | Source: Ambulatory Visit | Attending: Family Medicine | Admitting: Family Medicine

## 2018-01-12 DIAGNOSIS — R928 Other abnormal and inconclusive findings on diagnostic imaging of breast: Secondary | ICD-10-CM | POA: Diagnosis not present

## 2018-01-12 DIAGNOSIS — N6489 Other specified disorders of breast: Secondary | ICD-10-CM

## 2018-01-12 DIAGNOSIS — M1711 Unilateral primary osteoarthritis, right knee: Secondary | ICD-10-CM | POA: Diagnosis not present

## 2018-01-30 ENCOUNTER — Encounter: Payer: Self-pay | Admitting: Family Medicine

## 2018-01-30 ENCOUNTER — Ambulatory Visit (INDEPENDENT_AMBULATORY_CARE_PROVIDER_SITE_OTHER): Payer: Medicare HMO | Admitting: Family Medicine

## 2018-01-30 VITALS — BP 116/68 | HR 59 | Temp 98.3°F | Resp 15 | Ht 67.0 in | Wt 155.3 lb

## 2018-01-30 DIAGNOSIS — Z01818 Encounter for other preprocedural examination: Secondary | ICD-10-CM

## 2018-01-30 NOTE — Progress Notes (Signed)
Subjective:     Patient ID: Megan Reilly, female   DOB: 1941/04/12, 77 y.o.   MRN: [id number]  HPI Patient seen for preoperative clearance. She is looking at right total knee replacement in June. She has been much less active this year because of her progressive knee issues with weakness and pain with ambulation. She has no cardiac history. Has never smoked. No history of any chronic lung issues.  She has history of osteoporosis and takes Fosamax but no other prescription medications. Patient had echocardiogram 4 years ago which showed ejection fraction of 55-60% and only mild mitral regurgitation.  No history of hypertension or diabetes. No history of CAD or cerebrovascular disease  Past Medical History:  Diagnosis Date  . Allergy   . Arthritis   . Blood transfusion abn reaction or complication, no procedure mishap    77 years old after tonsilectomy  . Osteopenia   . Osteoporosis 03/2017   Past Surgical History:  Procedure Laterality Date  . ABDOMINAL HYSTERECTOMY     partial 1983  . COLONOSCOPY    . FOOT SURGERY     bunectomy, hammertoe, bil  . TONSILLECTOMY  1945    reports that she has never smoked. She has never used smokeless tobacco. She reports that she drinks about 3.0 oz of alcohol per week. She reports that she does not use drugs. family history includes Breast cancer in her maternal aunt; Diabetes in her father; Heart disease in her mother; Heart disease (age of onset: 6) in her father; Heart failure in her mother; Hyperlipidemia in her father and mother; Stroke in her mother. No Known Allergies   Review of Systems  Constitutional: Negative for chills and fever.  Respiratory: Negative for shortness of breath.   Cardiovascular: Negative for chest pain, palpitations and leg swelling.  Gastrointestinal: Negative for abdominal pain.  Genitourinary: Negative for dysuria.  Neurological: Negative for dizziness and syncope.  Psychiatric/Behavioral: Negative for confusion.        Objective:   Physical Exam  Constitutional: She is oriented to person, place, and time. She appears well-developed and well-nourished.  HENT:  Mouth/Throat: Oropharynx is clear and moist.  Neck: Neck supple.  No carotid bruits  Cardiovascular: Normal rate and regular rhythm.  Pulmonary/Chest: Effort normal and breath sounds normal. No respiratory distress. She has no wheezes. She has no rales.  Musculoskeletal: She exhibits no edema.  Neurological: She is alert and oriented to person, place, and time.       Assessment:     Preoperative assessment. Patient has osteoarthritis right knee with upcoming right total knee replacement. EKG today shows mild sinus bradycardia with heart rate around 54 with no acute changes or concerns    Plan:     -Paperwork completed for upcoming surgery. No known contraindications -She will be getting preoperative blood work through orthopedic office  Eulas Post MD [facility] at [facility]'

## 2018-03-08 NOTE — H&P (Signed)
TOTAL KNEE ADMISSION H&P  Patient is being admitted for right total knee arthroplasty.  Subjective:  Chief Complaint:right knee pain.  HPI: Megan Reilly, 77 y.o. female, has a history of pain and functional disability in the right knee due to arthritis and has failed non-surgical conservative treatments for greater than 12 weeks to includeNSAID's and/or analgesics, corticosteriod injections, use of assistive devices and activity modification.  Onset of symptoms was gradual, starting 1+ years ago with gradually worsening course since that time. The patient noted no past surgery on the right knee(s).  Patient currently rates pain in the right knee(s) at 10 out of 10 with activity. Patient has night pain, worsening of pain with activity and weight bearing, pain that interferes with activities of daily living, pain with passive range of motion, crepitus and joint swelling.  Patient has evidence of joint space narrowing by imaging studies.  There is no active infection.  PCP -Dr Carolann Littler NKDA  Patient Active Problem List   Diagnosis Date Noted  . Vasovagal syncope 12/19/2013  . Headache(784.0) 12/10/2013  . Subarachnoid hemorrhage following injury ([place]) 12/09/2013  . Subarachnoid bleed ([place]) 12/09/2013  . Loss of height 01/30/2013  . Back pain, lumbosacral 01/30/2013  . Family history of osteoporosis 01/30/2013  . 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 Osteoporosis 12/20/2012   Past Medical History:  Diagnosis Date  . Allergy   . Arthritis   . Blood transfusion abn reaction or complication, no procedure mishap    77 years old after tonsilectomy  . Osteopenia   . Osteoporosis 03/2017    Past Surgical History:  Procedure Laterality Date  . ABDOMINAL HYSTERECTOMY     partial 1983  . COLONOSCOPY    . FOOT SURGERY     bunectomy, hammertoe, bil  . TONSILLECTOMY  1945    No current facility-administered medications for this encounter.    Current Outpatient  Medications  Medication Sig Dispense Refill Last Dose  . alendronate (FOSAMAX) 70 MG tablet Take 1 tablet (70 mg total) by mouth every 7 (seven) days. Take with a full glass of water on an empty stomach. 4 tablet 11 Taking  . calcium carbonate (OS-CAL) 600 MG TABS Take 600 mg by mouth daily.    Taking  . cetirizine (ZYRTEC) 10 MG tablet Take 10 mg by mouth daily.   Taking  . Cholecalciferol (VITAMIN D-3) 1000 UNITS CAPS Take by mouth.   Taking  . fish oil-omega-3 fatty acids 1000 MG capsule Take 1 g by mouth daily.    Taking  . Magnesium 250 MG TABS Take 1 tablet by mouth daily.   Taking  . Multiple Vitamin (MULTIVITAMIN WITH MINERALS) TABS tablet Take 1 tablet by mouth every morning.   Taking  . Turmeric 1053 MG TABS Take by mouth.   Taking   No Known Allergies  Social History   Tobacco Use  . Smoking status: Never Smoker  . Smokeless tobacco: Never Used  Substance Use Topics  . Alcohol use: Yes    Alcohol/week: 3.0 oz    Types: 5 Glasses of wine per week    Comment: glass of wine 3-4 times a week    Family History  Problem Relation Age of Onset  . Heart disease Mother   . Hyperlipidemia Mother   . Stroke Mother   . Heart failure Mother   . Diabetes Father        type ll  . Heart disease Father 69  sudden death age 48  . Hyperlipidemia Father   . Breast cancer Maternal Aunt      Review of Systems  Constitutional: Positive for malaise/fatigue. Negative for chills, fever and weight loss.  HENT: Positive for hearing loss and tinnitus.   Respiratory: Negative for cough and shortness of breath.   Cardiovascular: Negative for chest pain, palpitations and orthopnea.       Has had vasovagal fainting in past; none for >1year  Gastrointestinal: Positive for constipation. Negative for nausea and vomiting.  Genitourinary: Negative for dysuria, frequency and urgency.  Musculoskeletal: Positive for joint pain.       Frequent calf muscle spasms at night  Neurological: Negative  for dizziness, tingling, focal weakness and headaches.  Endo/Heme/Allergies: Bruises/bleeds easily.  Psychiatric/Behavioral: The patient has insomnia.   All other systems reviewed and are negative.   Objective:  Physical Exam  Constitutional: She is oriented to person, place, and time. She appears well-developed and well-nourished.  HENT:  Head: Normocephalic and atraumatic.  Right Ear: External ear normal.  Left Ear: External ear normal.  +hearing aides  Eyes: Pupils are equal, round, and reactive to light. EOM are normal.  Neck: Normal range of motion. Neck supple.  Cardiovascular: Normal rate, regular rhythm, normal heart sounds and intact distal pulses.  Respiratory: Effort normal and breath sounds normal.  GI: Soft. Bowel sounds are normal.  Musculoskeletal:       Right knee: She exhibits effusion. Tenderness found. Medial joint line and lateral joint line tenderness noted.  Right knee has significant crepitus, valgus deformity 5 degrees   Neurological: She is alert and oriented to person, place, and time. She has normal reflexes.  Skin: Skin is warm and dry.  Psychiatric: She has a normal mood and affect. Her behavior is normal. Judgment and thought content normal.    Vital signs in last 24 hours: BP: ()/()  Arterial Line BP: ()/()   Labs:   Estimated body mass index is 24.32 kg/m as calculated from the following:   Height as of 01/30/18: 5\' 7"  (1.702 m).   Weight as of 01/30/18: 70.4 kg (155 lb 4.8 oz).   Imaging Review Plain radiographs demonstrate moderate degenerative joint disease of the right knee(s). The overall alignment ismild valgus. The bone quality appears to be adequate for age and reported activity level. X-rays dated 01/12/18 show bone-on-bone arthritis of lateral and patellofemoral compartments  Preoperative templating of the joint replacement has been completed, documented, and submitted to the Operating Room personnel in order to optimize  intra-operative equipment management.   Anticipated LOS equal to or greater than 2 midnights due to - Age 55 and older with one or more of the following:  - Obesity  - Expected need for hospital services (PT, OT, Nursing) required for safe  discharge  - Anticipated need for postoperative skilled nursing care or inpatient rehab        Assessment/Plan:  End stage arthritis, right knee   The patient history, physical examination, clinical judgment of the provider and imaging studies are consistent with end stage degenerative joint disease of the right knee(s) and total knee arthroplasty is deemed medically necessary. The treatment options including medical management, injection therapy arthroscopy and arthroplasty were discussed at length. The risks and benefits of total knee arthroplasty were presented and reviewed. The risks due to aseptic loosening, infection, stiffness, patella tracking problems, thromboembolic complications and other imponderables were discussed. The patient acknowledged the explanation, agreed to proceed with the plan and consent was  signed. Patient is being admitted for inpatient treatment for surgery, pain control, PT, OT, prophylactic antibiotics, VTE prophylaxis, progressive ambulation and ADL's and discharge planning. The patient is planning to be discharged home with home health services; supportive family, single level home.  IV [facility] Outpatient PT appointment set  Patient was instructed on what medications to stop prior to surgery. - Follow up visit in 2 weeks with Dr. Wynelle Link - Begin physical therapy following surgery - Pre-operative lab work as pre Pre-Surgical Testing - Prescriptions will be provided in hospital at time of discharge

## 2018-03-10 ENCOUNTER — Ambulatory Visit (INDEPENDENT_AMBULATORY_CARE_PROVIDER_SITE_OTHER): Payer: Medicare HMO | Admitting: Family Medicine

## 2018-03-10 ENCOUNTER — Encounter: Payer: Self-pay | Admitting: Family Medicine

## 2018-03-10 VITALS — BP 110/64 | HR 60 | Temp 98.8°F | Wt 153.7 lb

## 2018-03-10 DIAGNOSIS — M542 Cervicalgia: Secondary | ICD-10-CM

## 2018-03-10 NOTE — Progress Notes (Signed)
  Subjective:     Patient ID: Megan Reilly, female   DOB: [birth year], 77 y.o.   MRN: [id number]  HPI Patient seen with left-sided neck pain. Started about a week ago and the base of her neck and spreads to the trapezius area. No injury. 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 No radiculitis symptoms.  No left arm pain.  No chest pain.  Symptoms are slightly improved today. She tried some heat with temporary improvement. She occasionally takes Advil. She has upcoming knee replacement surgery in a couple weeks  Past Medical History:  Diagnosis Date  . Allergy   . Arthritis   . Blood transfusion abn reaction or complication, no procedure mishap    77 years old after tonsilectomy  . Osteopenia   . Osteoporosis 03/2017   Past Surgical History:  Procedure Laterality Date  . ABDOMINAL HYSTERECTOMY     partial 1983  . COLONOSCOPY    . FOOT SURGERY     bunectomy, hammertoe, bil  . TONSILLECTOMY  1945    reports that she has never smoked. She has never used smokeless tobacco. She reports that she drinks about 3.0 oz of alcohol per week. She reports that she does not use drugs. family history includes Breast cancer in her maternal aunt; Diabetes in her father; Heart disease in her mother; Heart disease (age of onset: 92) in her father; Heart failure in her mother; Hyperlipidemia in her father and mother; Stroke in her mother. No Known Allergies   Review of Systems  Constitutional: Negative for chills and fever.  Respiratory: Negative for shortness of breath.   Cardiovascular: Negative for chest pain.  Musculoskeletal: Positive for neck pain.  Neurological: Negative for weakness and numbness.       Objective:   Physical Exam  Constitutional: She appears well-developed and well-nourished.  Cardiovascular: Normal rate and regular rhythm.  Pulmonary/Chest: Effort normal and breath sounds normal. She has no wheezes. She has no rales.  Musculoskeletal:  Patient has some localized  tenderness left paracervical muscles lower cervical region and extending into the trapezius muscle on the left side. No spinal tenderness. She has good range of motion with flexion and extension. Fairly limited range of motion with rotation or lateral bending to either side.  Neurological:  Full strength upper extremities. Symmetric reflexes.       Assessment:     Left-sided neck pain. Suspect muscular    Plan:     -Recommend conservative therapy with topical heat and topical muscle creams such as Biofreeze -Recommend gentle range of motion exercises for neck -Follow-up for any persistent or worsening symptoms  Eulas Post MD [facility] at [facility]

## 2018-03-10 NOTE — Patient Instructions (Signed)
Continue with topical heat, muscle massage, and topical sports cream.

## 2018-03-14 DIAGNOSIS — M1711 Unilateral primary osteoarthritis, right knee: Secondary | ICD-10-CM | POA: Diagnosis not present

## 2018-03-17 NOTE — Patient Instructions (Signed)
Megan Reilly  03/17/2018   Your procedure is scheduled on: 03-27-18    Report to [facility] Main  Entrance    Report to admitting at 12:00PM    Call this number if you have problems the morning of surgery [phone]     Remember: Do not eat food After Midnight. YOU MAY HAVE CLEAR LIQUIDS UNTIL 8:45AM!     Take these medicines the morning of surgery with A SIP OF WATER: NONE                                 You may not have any metal on your body including hair pins and              piercings  Do not wear jewelry, make-up, lotions, powders or perfumes, deodorant             Do not wear nail polish.  Do not shave  48 hours prior to surgery.              Men may shave face and neck.   Do not bring valuables to the hospital. [place].  Contacts, dentures or bridgework may not be worn into surgery.  Leave suitcase in the car. After surgery it may be brought to your room.                Please read over the following fact sheets you were given: _____________________________________________________________________                CLEAR LIQUID DIET   Foods Allowed                                                                     Foods Excluded  Coffee and tea, regular and decaf                             liquids that you cannot  Plain Jell-O in any flavor                                             see through such as: Fruit ices (not with fruit pulp)                                     milk, soups, orange juice  Iced Popsicles                                    All solid food Carbonated beverages, regular and diet  Cranberry, grape and apple juices Sports drinks like Gatorade Lightly seasoned clear broth or consume(fat free) Sugar, honey syrup  Sample Menu Breakfast                                Lunch                                     Supper Cranberry  juice                    Beef broth                            Chicken broth Jell-O                                     Grape juice                           Apple juice Coffee or tea                        Jell-O                                      Popsicle                                                Coffee or tea                        Coffee or tea  _____________________________________________________________________  [facility] - Preparing for Surgery Before surgery, you can play an important role.  Because skin is not sterile, your skin needs to be as free of germs as possible.  You can reduce the number of germs on your skin by washing with CHG (chlorahexidine gluconate) soap before surgery.  CHG is an antiseptic cleaner which kills germs and bonds with the skin to continue killing germs even after washing. Please DO NOT use if you have an allergy to CHG or antibacterial soaps.  If your skin becomes reddened/irritated stop using the CHG and inform your nurse when you arrive at Short Stay. Do not shave (including legs and underarms) for at least 48 hours prior to the first CHG shower.  You may shave your face/neck. Please follow these instructions carefully:  1.  Shower with CHG Soap the night before surgery and the  morning of Surgery.  2.  If you choose to wash your hair, wash your hair first as usual with your  normal  shampoo.  3.  After you shampoo, rinse your hair and body thoroughly to remove the  shampoo.                           4.  Use CHG as you would any other liquid soap.  You can apply chg directly  to the skin and wash  Gently with a scrungie or clean washcloth.  5.  Apply the CHG Soap to your body ONLY FROM THE NECK DOWN.   Do not use on face/ open                           Wound or open sores. Avoid contact with eyes, ears mouth and genitals (private parts).                       Wash face,  Genitals (private parts) with your normal soap.              6.  Wash thoroughly, paying special attention to the area where your surgery  will be performed.  7.  Thoroughly rinse your body with warm water from the neck down.  8.  DO NOT shower/wash with your normal soap after using and rinsing off  the CHG Soap.                9.  Pat yourself dry with a clean towel.            10.  Wear clean pajamas.            11.  Place clean sheets on your bed the night of your first shower and do not  sleep with pets. Day of Surgery : Do not apply any lotions/deodorants the morning of surgery.  Please wear clean clothes to the hospital/surgery center.  FAILURE TO FOLLOW THESE INSTRUCTIONS MAY RESULT IN THE CANCELLATION OF YOUR SURGERY PATIENT SIGNATURE_________________________________  NURSE SIGNATURE__________________________________  ________________________________________________________________________   Megan Reilly  An incentive spirometer is a tool that can help keep your lungs clear and active. This tool measures how well you are filling your lungs with each breath. Taking long deep breaths may help reverse or decrease the chance of developing breathing (pulmonary) problems (especially infection) following:  A long period of time when you are unable to move or be active. BEFORE THE PROCEDURE   If the spirometer includes an indicator to show your best effort, your nurse or respiratory therapist will set it to a desired goal.  If possible, sit up straight or lean slightly forward. Try not to slouch.  Hold the incentive spirometer in an upright position. INSTRUCTIONS FOR USE  1. Sit on the edge of your bed if possible, or sit up as far as you can in bed or on a chair. 2. Hold the incentive spirometer in an upright position. 3. Breathe out normally. 4. Place the mouthpiece in your mouth and seal your lips tightly around it. 5. Breathe in slowly and as deeply as possible, raising the piston or the ball toward the top of the  column. 6. Hold your breath for 3-5 seconds or for as long as possible. Allow the piston or ball to fall to the bottom of the column. 7. Remove the mouthpiece from your mouth and breathe out normally. 8. Rest for a few seconds and repeat Steps 1 through 7 at least 10 times every 1-2 hours when you are awake. Take your time and take a few normal breaths between deep breaths. 9. The spirometer may include an indicator to show your best effort. Use the indicator as a goal to work toward during each repetition. 10. After each set of 10 deep breaths, practice coughing to be sure your lungs are clear. If you have an incision (the cut made at the time of  surgery), support your incision when coughing by placing a pillow or rolled up towels firmly against it. Once you are able to get out of bed, walk around indoors and cough well. You may stop using the incentive spirometer when instructed by your caregiver.  RISKS AND COMPLICATIONS  Take your time so you do not get dizzy or light-headed.  If you are in pain, you may need to take or ask for pain medication before doing incentive spirometry. It is harder to take a deep breath if you are having pain. AFTER USE  Rest and breathe slowly and easily.  It can be helpful to keep track of a log of your progress. Your caregiver can provide you with a simple table to help with this. If you are using the spirometer at home, follow these instructions: [place] IF:   You are having difficultly using the spirometer.  You have trouble using the spirometer as often as instructed.  Your pain medication is not giving enough relief while using the spirometer.  You develop fever of 100.5 F (38.1 C) or higher. SEEK IMMEDIATE MEDICAL CARE IF:   You cough up bloody sputum that had not been present before.  You develop fever of 102 F (38.9 C) or greater.  You develop worsening pain at or near the incision site. MAKE SURE YOU:   Understand these  instructions.  Will watch your condition.  Will get help right away if you are not doing well or get worse. Document Released: 02/07/2007 Document Revised: 12/20/2011 Document Reviewed: 04/10/2007 Exit[employer] Patient Information 2014 [employer], [place].    _____________________________________________________________________   WHAT IS A BLOOD TRANSFUSION? Blood Transfusion Information  A transfusion is the replacement of blood or some of its parts. Blood is made up of multiple cells which provide different functions.  Red blood cells carry oxygen and are used for blood loss replacement.  White blood cells fight against infection.  Platelets control bleeding.  Plasma helps clot blood.  Other blood products are available for specialized needs, such as hemophilia or other clotting disorders. BEFORE THE TRANSFUSION  Who gives blood for transfusions?   Healthy volunteers who are fully evaluated to make sure their blood is safe. This is blood bank blood. Transfusion therapy is the safest it has ever been in the practice of medicine. Before blood is taken from a donor, a complete history is taken to make sure that person has no history of diseases nor engages in risky social behavior (examples are intravenous drug use or sexual activity with multiple partners). The donor's travel history is screened to minimize risk of transmitting infections, such as malaria. The donated blood is tested for signs of infectious diseases, such as HIV and hepatitis. The blood is then tested to be sure it is compatible with you in order to minimize the chance of a transfusion reaction. If you or a relative donates blood, this is often done in anticipation of surgery and is not appropriate for emergency situations. It takes many days to process the donated blood. RISKS AND COMPLICATIONS Although transfusion therapy is very safe and saves many lives, the main dangers of transfusion include:   Getting an infectious  disease.  Developing a transfusion reaction. This is an allergic reaction to something in the blood you were given. Every precaution is taken to prevent this. The decision to have a blood transfusion has been considered carefully by your caregiver before blood is given. Blood is not given unless the benefits outweigh the risks.  AFTER THE TRANSFUSION  Right after receiving a blood transfusion, you will usually feel much better and more energetic. This is especially true if your red blood cells have gotten low (anemic). The transfusion raises the level of the red blood cells which carry oxygen, and this usually causes an energy increase.  The nurse administering the transfusion will monitor you carefully for complications. HOME CARE INSTRUCTIONS  No special instructions are needed after a transfusion. You may find your energy is better. Speak with your caregiver about any limitations on activity for underlying diseases you may have. SEEK MEDICAL CARE IF:   Your condition is not improving after your transfusion.  You develop redness or irritation at the intravenous (IV) site. SEEK IMMEDIATE MEDICAL CARE IF:  Any of the following symptoms occur over the next 12 hours:  Shaking chills.  You have a temperature by mouth above 102 F (38.9 C), not controlled by medicine.  Chest, back, or muscle pain.  People around you feel you are not acting correctly or are confused.  Shortness of breath or difficulty breathing.  Dizziness and fainting.  You get a rash or develop hives.  You have a decrease in urine output.  Your urine turns a dark color or changes to pink, red, or brown. Any of the following symptoms occur over the next 10 days:  You have a temperature by mouth above 102 F (38.9 C), not controlled by medicine.  Shortness of breath.  Weakness after normal activity.  The white part of the eye turns yellow (jaundice).  You have a decrease in the amount of urine or are  urinating less often.  Your urine turns a dark color or changes to pink, red, or brown. Document Released: 09/24/2000 Document Revised: 12/20/2011 Document Reviewed: 05/13/2008 [facility] Patient Information 2014 [place], [place].  _______________________________________________________________________

## 2018-03-17 NOTE — Progress Notes (Signed)
EKG 01-30-18 Epic

## 2018-03-21 ENCOUNTER — Other Ambulatory Visit: Payer: Self-pay

## 2018-03-21 ENCOUNTER — Encounter (HOSPITAL_COMMUNITY): Payer: Self-pay

## 2018-03-21 ENCOUNTER — Encounter (HOSPITAL_COMMUNITY)
Admission: RE | Admit: 2018-03-21 | Discharge: 2018-03-21 | Disposition: A | Discharge status: Home / Self Care | Payer: Medicare HMO | Source: Ambulatory Visit | Attending: Orthopedic Surgery | Admitting: Orthopedic Surgery

## 2018-03-21 DIAGNOSIS — Z01812 Encounter for preprocedural laboratory examination: Secondary | ICD-10-CM | POA: Diagnosis not present

## 2018-03-21 HISTORY — DX: Syncope and collapse: R55

## 2018-03-21 LAB — URINALYSIS, ROUTINE W REFLEX MICROSCOPIC
Bilirubin Urine: NEGATIVE
Glucose, UA: NEGATIVE mg/dL
Hgb urine dipstick: NEGATIVE
Ketones, ur: NEGATIVE mg/dL
Leukocytes, UA: NEGATIVE
Nitrite: NEGATIVE
Protein, ur: NEGATIVE mg/dL
Specific Gravity, Urine: 1.004 — ABNORMAL LOW (ref 1.005–1.030)
pH: 6 (ref 5.0–8.0)

## 2018-03-21 LAB — COMPREHENSIVE METABOLIC PANEL
ALT: 15 U/L (ref 14–54)
AST: 20 U/L (ref 15–41)
Albumin: 4.3 g/dL (ref 3.5–5.0)
Alkaline Phosphatase: 53 U/L (ref 38–126)
Anion gap: 7 (ref 5–15)
BUN: 17 mg/dL (ref 6–20)
CO2: 29 mmol/L (ref 22–32)
Calcium: 9.9 mg/dL (ref 8.9–10.3)
Chloride: 107 mmol/L (ref 101–111)
Creatinine, Ser: 0.55 mg/dL (ref 0.44–1.00)
GFR calc Af Amer: >60 mL/min (ref >60)
GFR calc non Af Amer: >60 mL/min (ref >60)
Glucose, Bld: 109 mg/dL — ABNORMAL HIGH (ref 65–99)
Potassium: 4.5 mmol/L (ref 3.5–5.1)
Sodium: 143 mmol/L (ref 135–145)
Total Bilirubin: 0.9 mg/dL (ref 0.3–1.2)
Total Protein: 6.8 g/dL (ref 6.5–8.1)

## 2018-03-21 LAB — CBC
HCT: 41.2 % (ref 36.0–46.0)
Hemoglobin: 13.8 g/dL (ref 12.0–15.0)
MCH: 32.6 pg (ref 26.0–34.0)
MCHC: 33.5 g/dL (ref 30.0–36.0)
MCV: 97.4 fL (ref 78.0–100.0)
Platelets: 186 10*3/uL (ref 150–400)
RBC: 4.23 MIL/uL (ref 3.87–5.11)
RDW: 12.5 % (ref 11.5–15.5)
WBC: 7.1 10*3/uL (ref 4.0–10.5)

## 2018-03-21 LAB — PROTIME-INR
INR: 0.98
Prothrombin Time: 12.9 seconds (ref 11.4–15.2)

## 2018-03-21 LAB — SURGICAL PCR SCREEN
MRSA, PCR: NEGATIVE
STAPHYLOCOCCUS AUREUS: NEGATIVE

## 2018-03-21 LAB — ABO/RH: ABO/RH(D): A POS

## 2018-03-21 LAB — APTT: aPTT: 32 seconds (ref 24–36)

## 2018-03-26 MED ORDER — BUPIVACAINE LIPOSOME 1.3 % IJ SUSP
20.0000 mL | INTRAMUSCULAR | Status: DC
  Filled 2018-03-26: qty 20

## 2018-03-26 MED ORDER — TRANEXAMIC ACID 1000 MG/10ML IV SOLN
1000.0000 mg | INTRAVENOUS | Status: AC
  Administered 2018-03-27: 1000 mg via INTRAVENOUS
  Filled 2018-03-26: qty 1100

## 2018-03-27 ENCOUNTER — Inpatient Hospital Stay (HOSPITAL_COMMUNITY)
Admission: RE | Admit: 2018-03-27 | Discharge: 2018-03-28 | DRG: 470 | Disposition: A | Discharge status: Home / Self Care | Payer: Medicare HMO | Attending: Orthopedic Surgery | Admitting: Orthopedic Surgery

## 2018-03-27 ENCOUNTER — Other Ambulatory Visit: Payer: Self-pay

## 2018-03-27 ENCOUNTER — Encounter (HOSPITAL_COMMUNITY): Payer: Self-pay | Admitting: Emergency Medicine

## 2018-03-27 ENCOUNTER — Inpatient Hospital Stay (HOSPITAL_COMMUNITY): Payer: Medicare HMO | Admitting: Anesthesiology

## 2018-03-27 ENCOUNTER — Encounter (HOSPITAL_COMMUNITY)
Admission: RE | Disposition: A | Discharge status: Home / Self Care | Payer: Self-pay | Source: Home / Self Care | Attending: Orthopedic Surgery

## 2018-03-27 DIAGNOSIS — M1710 Unilateral primary osteoarthritis, unspecified knee: Secondary | ICD-10-CM | POA: Diagnosis present

## 2018-03-27 DIAGNOSIS — G8918 Other acute postprocedural pain: Secondary | ICD-10-CM | POA: Diagnosis not present

## 2018-03-27 DIAGNOSIS — M25561 Pain in right knee: Secondary | ICD-10-CM | POA: Diagnosis present

## 2018-03-27 DIAGNOSIS — M179 Osteoarthritis of knee, unspecified: Secondary | ICD-10-CM | POA: Diagnosis present

## 2018-03-27 DIAGNOSIS — Z90710 Acquired absence of both cervix and uterus: Secondary | ICD-10-CM | POA: Diagnosis not present

## 2018-03-27 DIAGNOSIS — M810 Age-related osteoporosis without current pathological fracture: Secondary | ICD-10-CM | POA: Diagnosis not present

## 2018-03-27 DIAGNOSIS — Z79899 Other long term (current) drug therapy: Secondary | ICD-10-CM

## 2018-03-27 DIAGNOSIS — M1711 Unilateral primary osteoarthritis, right knee: Principal | ICD-10-CM | POA: Diagnosis present

## 2018-03-27 DIAGNOSIS — M2100 Valgus deformity, not elsewhere classified, unspecified site: Secondary | ICD-10-CM | POA: Diagnosis present

## 2018-03-27 DIAGNOSIS — Z7983 Long term (current) use of bisphosphonates: Secondary | ICD-10-CM

## 2018-03-27 HISTORY — PX: TOTAL KNEE ARTHROPLASTY: SHX125

## 2018-03-27 LAB — TYPE AND SCREEN
ABO/RH(D): A POS
Antibody Screen: NEGATIVE

## 2018-03-27 SURGERY — ARTHROPLASTY, KNEE, TOTAL
Anesthesia: Spinal | Site: Knee | Laterality: Right

## 2018-03-27 MED ORDER — METOCLOPRAMIDE HCL 5 MG/ML IJ SOLN: 5.0000 mg | Freq: Three times a day (TID) | INTRAMUSCULAR | Status: DC | PRN

## 2018-03-27 MED ORDER — ACETAMINOPHEN 325 MG PO TABS: 325.0000 mg | ORAL_TABLET | Freq: Four times a day (QID) | ORAL | Status: DC | PRN

## 2018-03-27 MED ORDER — ASPIRIN EC 325 MG PO TBEC
325.0000 mg | DELAYED_RELEASE_TABLET | Freq: Two times a day (BID) | ORAL | Status: DC
  Administered 2018-03-28: 325 mg via ORAL
  Filled 2018-03-27: qty 1

## 2018-03-27 MED ORDER — POLYETHYLENE GLYCOL 3350 17 G PO PACK: 17.0000 g | PACK | Freq: Every day | ORAL | Status: DC | PRN

## 2018-03-27 MED ORDER — FENTANYL CITRATE (PF) 100 MCG/2ML IJ SOLN
100.0000 ug | Freq: Once | INTRAMUSCULAR | Status: AC
  Administered 2018-03-27: 100 ug via INTRAVENOUS
  Filled 2018-03-27: qty 2

## 2018-03-27 MED ORDER — CHLORHEXIDINE GLUCONATE 4 % EX LIQD: 60.0000 mL | Freq: Once | CUTANEOUS | Status: DC

## 2018-03-27 MED ORDER — METHOCARBAMOL 500 MG PO TABS
500.0000 mg | ORAL_TABLET | Freq: Four times a day (QID) | ORAL | Status: DC | PRN
  Administered 2018-03-28: 500 mg via ORAL
  Filled 2018-03-27: qty 1

## 2018-03-27 MED ORDER — PROPOFOL 10 MG/ML IV BOLUS
INTRAVENOUS | Status: DC | PRN
  Administered 2018-03-27: 30 mg via INTRAVENOUS
  Administered 2018-03-27: 20 mg via INTRAVENOUS

## 2018-03-27 MED ORDER — SODIUM CHLORIDE 0.9 % IJ SOLN
INTRAMUSCULAR | Status: DC | PRN
  Administered 2018-03-27: 60 mL

## 2018-03-27 MED ORDER — SODIUM CHLORIDE 0.9 % IR SOLN
Status: DC | PRN
Start: 2018-03-27 — End: 2018-03-27
  Administered 2018-03-27: 1000 mL

## 2018-03-27 MED ORDER — CEFAZOLIN SODIUM-DEXTROSE 1-4 GM/50ML-% IV SOLN
1.0000 g | Freq: Four times a day (QID) | INTRAVENOUS | Status: AC
  Administered 2018-03-27 – 2018-03-28 (×2): 1 g via INTRAVENOUS
  Filled 2018-03-27 (×2): qty 50

## 2018-03-27 MED ORDER — DEXAMETHASONE SODIUM PHOSPHATE 10 MG/ML IJ SOLN
INTRAMUSCULAR | Status: DC | PRN
  Administered 2018-03-27: 10 mg via INTRAVENOUS

## 2018-03-27 MED ORDER — LIDOCAINE HCL (CARDIAC) PF 100 MG/5ML IV SOSY
PREFILLED_SYRINGE | INTRAVENOUS | Status: DC | PRN
  Administered 2018-03-27: 60 mg via INTRAVENOUS

## 2018-03-27 MED ORDER — ONDANSETRON HCL 4 MG/2ML IJ SOLN: 4.0000 mg | Freq: Four times a day (QID) | INTRAMUSCULAR | Status: DC | PRN

## 2018-03-27 MED ORDER — BISACODYL 10 MG RE SUPP: 10.0000 mg | Freq: Every day | RECTAL | Status: DC | PRN

## 2018-03-27 MED ORDER — BUPIVACAINE IN DEXTROSE 0.75-8.25 % IT SOLN
INTRATHECAL | Status: DC | PRN
  Administered 2018-03-27: 1.8 mL via INTRATHECAL

## 2018-03-27 MED ORDER — ONDANSETRON HCL 4 MG PO TABS: 4.0000 mg | ORAL_TABLET | Freq: Four times a day (QID) | ORAL | Status: DC | PRN

## 2018-03-27 MED ORDER — DOCUSATE SODIUM 100 MG PO CAPS
100.0000 mg | ORAL_CAPSULE | Freq: Two times a day (BID) | ORAL | Status: DC
  Administered 2018-03-27 – 2018-03-28 (×2): 100 mg via ORAL
  Filled 2018-03-27 (×2): qty 1

## 2018-03-27 MED ORDER — LORATADINE 10 MG PO TABS: 10.0000 mg | ORAL_TABLET | Freq: Every day | ORAL | Status: DC | PRN

## 2018-03-27 MED ORDER — EPHEDRINE SULFATE-NACL 50-0.9 MG/10ML-% IV SOSY
PREFILLED_SYRINGE | INTRAVENOUS | Status: DC | PRN
  Administered 2018-03-27 (×2): 5 mg via INTRAVENOUS

## 2018-03-27 MED ORDER — METHOCARBAMOL 1000 MG/10ML IJ SOLN
500.0000 mg | Freq: Four times a day (QID) | INTRAMUSCULAR | Status: DC | PRN
  Filled 2018-03-27: qty 5

## 2018-03-27 MED ORDER — FLEET ENEMA 7-19 GM/118ML RE ENEM: 1.0000 | ENEMA | Freq: Once | RECTAL | Status: DC | PRN

## 2018-03-27 MED ORDER — SODIUM CHLORIDE 0.9 % IV SOLN
INTRAVENOUS | Status: DC
  Administered 2018-03-27: via INTRAVENOUS

## 2018-03-27 MED ORDER — CEFAZOLIN SODIUM-DEXTROSE 2-4 GM/100ML-% IV SOLN
2.0000 g | INTRAVENOUS | Status: AC
  Administered 2018-03-27: 2 g via INTRAVENOUS
  Filled 2018-03-27: qty 100

## 2018-03-27 MED ORDER — MIDAZOLAM HCL 2 MG/2ML IJ SOLN
2.0000 mg | Freq: Once | INTRAMUSCULAR | Status: AC
  Administered 2018-03-27: 2 mg via INTRAVENOUS
  Filled 2018-03-27: qty 2

## 2018-03-27 MED ORDER — ACETAMINOPHEN 500 MG PO TABS
1000.0000 mg | ORAL_TABLET | Freq: Four times a day (QID) | ORAL | Status: DC
  Administered 2018-03-27 – 2018-03-28 (×2): 1000 mg via ORAL
  Filled 2018-03-27 (×2): qty 2

## 2018-03-27 MED ORDER — TRANEXAMIC ACID 1000 MG/10ML IV SOLN
1000.0000 mg | Freq: Once | INTRAVENOUS | Status: AC
  Administered 2018-03-27: 1000 mg via INTRAVENOUS
  Filled 2018-03-27: qty 1100

## 2018-03-27 MED ORDER — ONDANSETRON HCL 4 MG/2ML IJ SOLN
INTRAMUSCULAR | Status: DC | PRN
Start: 2018-03-27 — End: 2018-03-27
  Administered 2018-03-27: 4 mg via INTRAVENOUS

## 2018-03-27 MED ORDER — DIPHENHYDRAMINE HCL 12.5 MG/5ML PO ELIX: 12.5000 mg | ORAL_SOLUTION | ORAL | Status: DC | PRN

## 2018-03-27 MED ORDER — EPHEDRINE 5 MG/ML INJ
INTRAVENOUS | Status: AC
  Filled 2018-03-27: qty 10

## 2018-03-27 MED ORDER — TRAMADOL HCL 50 MG PO TABS: 50.0000 mg | ORAL_TABLET | Freq: Four times a day (QID) | ORAL | Status: DC | PRN

## 2018-03-27 MED ORDER — MENTHOL 3 MG MT LOZG: 1.0000 | LOZENGE | OROMUCOSAL | Status: DC | PRN

## 2018-03-27 MED ORDER — OXYCODONE HCL 5 MG PO TABS: 5.0000 mg | ORAL_TABLET | ORAL | Status: DC | PRN

## 2018-03-27 MED ORDER — DEXAMETHASONE SODIUM PHOSPHATE 10 MG/ML IJ SOLN: 8.0000 mg | Freq: Once | INTRAMUSCULAR | Status: DC

## 2018-03-27 MED ORDER — GLYCOPYRROLATE 0.2 MG/ML IV SOSY
PREFILLED_SYRINGE | INTRAVENOUS | Status: AC
  Filled 2018-03-27: qty 5

## 2018-03-27 MED ORDER — BUPIVACAINE HCL (PF) 0.75 % IJ SOLN
INTRAMUSCULAR | Status: DC | PRN
  Administered 2018-03-27: 25 mL via PERINEURAL

## 2018-03-27 MED ORDER — BUPIVACAINE LIPOSOME 1.3 % IJ SUSP
INTRAMUSCULAR | Status: DC | PRN
  Administered 2018-03-27: 20 mL

## 2018-03-27 MED ORDER — MEPERIDINE HCL 50 MG/ML IJ SOLN: 6.2500 mg | INTRAMUSCULAR | Status: DC | PRN

## 2018-03-27 MED ORDER — LACTATED RINGERS IV SOLN
INTRAVENOUS | Status: DC
  Administered 2018-03-27 (×3): via INTRAVENOUS

## 2018-03-27 MED ORDER — SODIUM CHLORIDE 0.9 % IJ SOLN
INTRAMUSCULAR | Status: AC
  Filled 2018-03-27: qty 10

## 2018-03-27 MED ORDER — OXYCODONE HCL 5 MG PO TABS: 10.0000 mg | ORAL_TABLET | ORAL | Status: DC | PRN

## 2018-03-27 MED ORDER — PROPOFOL 10 MG/ML IV BOLUS
INTRAVENOUS | Status: AC
  Filled 2018-03-27: qty 60

## 2018-03-27 MED ORDER — HYDROMORPHONE HCL 1 MG/ML IJ SOLN: 0.5000 mg | INTRAMUSCULAR | Status: DC | PRN

## 2018-03-27 MED ORDER — SODIUM CHLORIDE 0.9 % IJ SOLN
INTRAMUSCULAR | Status: AC
  Filled 2018-03-27: qty 50

## 2018-03-27 MED ORDER — PROPOFOL 500 MG/50ML IV EMUL
INTRAVENOUS | Status: DC | PRN
  Administered 2018-03-27: 100 ug/kg/min via INTRAVENOUS

## 2018-03-27 MED ORDER — DEXAMETHASONE SODIUM PHOSPHATE 10 MG/ML IJ SOLN
10.0000 mg | Freq: Once | INTRAMUSCULAR | Status: AC
  Administered 2018-03-28: 10 mg via INTRAVENOUS
  Filled 2018-03-27: qty 1

## 2018-03-27 MED ORDER — METOCLOPRAMIDE HCL 5 MG PO TABS: 5.0000 mg | ORAL_TABLET | Freq: Three times a day (TID) | ORAL | Status: DC | PRN

## 2018-03-27 MED ORDER — PHENOL 1.4 % MT LIQD
1.0000 | OROMUCOSAL | Status: DC | PRN
  Filled 2018-03-27: qty 177

## 2018-03-27 MED ORDER — LACTATED RINGERS IV SOLN: INTRAVENOUS | Status: DC

## 2018-03-27 MED ORDER — ACETAMINOPHEN 10 MG/ML IV SOLN
1000.0000 mg | Freq: Four times a day (QID) | INTRAVENOUS | Status: AC
  Administered 2018-03-27 – 2018-03-28 (×2): 1000 mg via INTRAVENOUS
  Filled 2018-03-27 (×3): qty 100

## 2018-03-27 MED ORDER — SODIUM CHLORIDE 0.9 % IR SOLN
Status: DC | PRN
  Administered 2018-03-27: 1000 mL

## 2018-03-27 MED ORDER — FENTANYL CITRATE (PF) 100 MCG/2ML IJ SOLN: 25.0000 ug | INTRAMUSCULAR | Status: DC | PRN

## 2018-03-27 MED ORDER — PHENYLEPHRINE 40 MCG/ML (10ML) SYRINGE FOR IV PUSH (FOR BLOOD PRESSURE SUPPORT)
PREFILLED_SYRINGE | INTRAVENOUS | Status: DC | PRN
  Administered 2018-03-27 (×3): 80 ug via INTRAVENOUS

## 2018-03-27 SURGICAL SUPPLY — 53 items
BAG DECANTER FOR FLEXI CONT (MISCELLANEOUS) ×2 IMPLANT
BAG SPEC THK2 15X12 ZIP CLS (MISCELLANEOUS) ×1
BAG ZIPLOCK 12X15 (MISCELLANEOUS) ×2 IMPLANT
BANDAGE ACE 6X5 VEL STRL LF (GAUZE/BANDAGES/DRESSINGS) ×2 IMPLANT
BANDAGE ELASTIC 6 VELCRO ST LF (GAUZE/BANDAGES/DRESSINGS) ×1 IMPLANT
BLADE SAG 18X100X1.27 (BLADE) ×2 IMPLANT
BLADE SAW SGTL 11.0X1.19X90.0M (BLADE) ×2 IMPLANT
BOWL SMART MIX CTS (DISPOSABLE) ×2 IMPLANT
CAP KNEE TOTAL 3 SIGMA ×1 IMPLANT
CEMENT HV SMART SET (Cement) ×4 IMPLANT
COVER SURGICAL LIGHT HANDLE (MISCELLANEOUS) ×2 IMPLANT
CUFF TOURN SGL QUICK 34 (TOURNIQUET CUFF) ×2
CUFF TRNQT CYL 34X4X40X1 (TOURNIQUET CUFF) ×1 IMPLANT
DECANTER SPIKE VIAL GLASS SM (MISCELLANEOUS) ×2 IMPLANT
DRAPE U-SHAPE 47X51 STRL (DRAPES) ×2 IMPLANT
DRSG ADAPTIC 3X8 NADH LF (GAUZE/BANDAGES/DRESSINGS) ×2 IMPLANT
DRSG PAD ABDOMINAL 8X10 ST (GAUZE/BANDAGES/DRESSINGS) ×2 IMPLANT
DURAPREP 26ML APPLICATOR (WOUND CARE) ×2 IMPLANT
ELECT REM PT RETURN 15FT ADLT (MISCELLANEOUS) ×2 IMPLANT
EVACUATOR 1/8 PVC DRAIN (DRAIN) ×2 IMPLANT
GAUZE SPONGE 4X4 12PLY STRL (GAUZE/BANDAGES/DRESSINGS) ×2 IMPLANT
GLOVE BIO SURGEON STRL SZ 6.5 (GLOVE) ×2 IMPLANT
GLOVE BIO SURGEON STRL SZ8 (GLOVE) ×4 IMPLANT
GLOVE BIOGEL PI IND STRL 6.5 (GLOVE) ×2 IMPLANT
GLOVE BIOGEL PI IND STRL 8 (GLOVE) ×1 IMPLANT
GLOVE BIOGEL PI INDICATOR 6.5 (GLOVE) ×2
GLOVE BIOGEL PI INDICATOR 8 (GLOVE) ×1
GOWN STRL REUS W/TWL LRG LVL3 (GOWN DISPOSABLE) ×4 IMPLANT
HANDPIECE INTERPULSE COAX TIP (DISPOSABLE) ×2
HOLDER FOLEY CATH W/STRAP (MISCELLANEOUS) IMPLANT
IMMOBILIZER KNEE 20 (SOFTGOODS) ×2
IMMOBILIZER KNEE 20 THIGH 36 (SOFTGOODS) ×1 IMPLANT
MANIFOLD NEPTUNE II (INSTRUMENTS) ×2 IMPLANT
NS IRRIG 1000ML POUR BTL (IV SOLUTION) ×2 IMPLANT
PACK TOTAL KNEE CUSTOM (KITS) ×2 IMPLANT
PAD ABD 7.5X8 STRL (GAUZE/BANDAGES/DRESSINGS) ×1 IMPLANT
PADDING CAST ABS 6INX4YD NS (CAST SUPPLIES) ×1
PADDING CAST ABS COTTON 6X4 NS (CAST SUPPLIES) IMPLANT
PADDING CAST COTTON 6X4 STRL (CAST SUPPLIES) ×6 IMPLANT
POSITIONER SURGICAL ARM (MISCELLANEOUS) ×2 IMPLANT
SET HNDPC FAN SPRY TIP SCT (DISPOSABLE) ×1 IMPLANT
STRIP CLOSURE SKIN 1/2X4 (GAUZE/BANDAGES/DRESSINGS) ×4 IMPLANT
SUT MNCRL AB 4-0 PS2 18 (SUTURE) ×2 IMPLANT
SUT STRATAFIX 0 PDS 27 VIOLET (SUTURE) ×2
SUT VIC AB 2-0 CT1 27 (SUTURE) ×6
SUT VIC AB 2-0 CT1 TAPERPNT 27 (SUTURE) ×3 IMPLANT
SUTURE STRATFX 0 PDS 27 VIOLET (SUTURE) ×1 IMPLANT
SYR 50ML LL SCALE MARK (SYRINGE) ×2 IMPLANT
TAPE STRIPS DRAPE STRL (GAUZE/BANDAGES/DRESSINGS) ×1 IMPLANT
TRAY FOLEY MTR SLVR 16FR STAT (SET/KITS/TRAYS/PACK) ×2 IMPLANT
WATER STERILE IRR 1000ML POUR (IV SOLUTION) ×4 IMPLANT
WRAP KNEE MAXI GEL POST OP (GAUZE/BANDAGES/DRESSINGS) ×2 IMPLANT
YANKAUER SUCT BULB TIP 10FT TU (MISCELLANEOUS) ×2 IMPLANT

## 2018-03-27 NOTE — Anesthesia Postprocedure Evaluation (Signed)
Anesthesia Post Note  Patient: Megan Reilly  Procedure(s) Performed: RIGHT TOTAL KNEE ARTHROPLASTY (Right Knee)     Patient location during evaluation: PACU Anesthesia Type: Spinal Level of consciousness: oriented and awake and alert Pain management: pain level controlled Vital Signs Assessment: post-procedure vital signs reviewed and stable Respiratory status: spontaneous breathing, respiratory function stable and patient connected to nasal cannula oxygen Cardiovascular status: blood pressure returned to baseline and stable Postop Assessment: no headache, no backache and no apparent nausea or vomiting Anesthetic complications: no    Last Vitals:  Vitals:   03/27/18 1745 03/27/18 1800  BP: (!) 119/54 130/64  Pulse: (!) 56 60  Resp: 15 16  Temp: (!) 36.3 C (!) 35.6 C  SpO2: 100% 100%    Last Pain:  Vitals:   03/27/18 1800  TempSrc:   PainSc: 0-No pain    LLE Motor Response: Purposeful movement (03/27/18 1800) LLE Sensation: Numbness (03/27/18 1800) RLE Motor Response: Purposeful movement (03/27/18 1800) RLE Sensation: Numbness (03/27/18 1800) L Sensory Level: S1-Sole of foot, small toes (03/27/18 1800) R Sensory Level: S1-Sole of foot, small toes (03/27/18 1800)  Polly Barner

## 2018-03-27 NOTE — Transfer of Care (Signed)
Immediate Anesthesia Transfer of Care Note  Patient: Megan Reilly  Procedure(s) Performed: RIGHT TOTAL KNEE ARTHROPLASTY (Right Knee)  Patient Location: PACU  Anesthesia Type:Spinal  Level of Consciousness: awake, alert , oriented and patient cooperative  Airway & Oxygen Therapy: Patient Spontanous Breathing and Patient connected to face mask oxygen  Post-op Assessment: Report given to RN and Post -op Vital signs reviewed and stable  Post vital signs: stable  Last Vitals:  Vitals Value Taken Time  BP 119/65 03/27/2018  5:31 PM  Temp    Pulse 67 03/27/2018  5:34 PM  Resp 14 03/27/2018  5:34 PM  SpO2 100 % 03/27/2018  5:34 PM  Vitals shown include unvalidated device data.  Last Pain:  Vitals:   03/27/18 1246  TempSrc:   PainSc: 2       Patients Stated Pain Goal: 4 (16/07/37 1062)  Complications: No apparent anesthesia complications

## 2018-03-27 NOTE — Progress Notes (Deleted)
AssistedDr. Oddono with right, ultrasound guided, adductor canal block. Side rails up, monitors on throughout procedure. See vital signs in flow sheet. Tolerated Procedure well.  

## 2018-03-27 NOTE — Anesthesia Procedure Notes (Signed)
Spinal  Patient location during procedure: OR End time: 03/27/2018 3:41 PM Staffing Resident/CRNA: Caryl Pina T, CRNA Performed: resident/CRNA  Preanesthetic Checklist Completed: patient identified, site marked, surgical consent, pre-op evaluation, timeout performed, IV checked, risks and benefits discussed and monitors and equipment checked Spinal Block Patient position: sitting Prep: ChloraPrep Patient monitoring: continuous pulse ox, blood pressure and heart rate Approach: midline Location: L4-5 Injection technique: single-shot Needle Needle type: Pencan  Needle gauge: 24 G Needle length: 9 cm Assessment Sensory level: T6 Additional Notes Expiration date of kit checked and confirmed. Patient tolerated procedure well, without complications.

## 2018-03-27 NOTE — Discharge Instructions (Signed)
° °Dr. Frank Aluisio °Total Joint Specialist °[facility] °[address] °, [place] [address] °[phone] ° °TOTAL KNEE REPLACEMENT POSTOPERATIVE DIRECTIONS ° °Knee Rehabilitation, Guidelines Following Surgery  °Results after knee surgery are often greatly improved when you follow the exercise, range of motion and muscle strengthening exercises prescribed by your doctor. Safety measures are also important to protect the knee from further injury. Any time any of these exercises cause you to have increased pain or swelling in your knee joint, decrease the amount until you are comfortable again and slowly increase them. If you have problems or questions, call your caregiver or physical therapist for advice.  ° °HOME CARE INSTRUCTIONS  °• Remove items at home which could result in a fall. This includes throw rugs or furniture in walking pathways.  °· ICE to the affected knee every three hours for 30 minutes at a time and then as needed for pain and swelling.  Continue to use ice on the knee for pain and swelling from surgery. You may notice swelling that will progress down to the foot and ankle.  This is normal after surgery.  Elevate the leg when you are not up walking on it.   °· Continue to use the breathing machine which will help keep your temperature down.  It is common for your temperature to cycle up and down following surgery, especially at night when you are not up moving around and exerting yourself.  The breathing machine keeps your lungs expanded and your temperature down. °· Do not place pillow under knee, focus on keeping the knee straight while resting ° °DIET °You may resume your previous home diet once your are discharged from the hospital. ° °DRESSING / WOUND CARE / SHOWERING °You may shower 3 days after surgery, but keep the wounds dry during showering.  You may use an occlusive plastic wrap (Press'n Seal for example), NO SOAKING/SUBMERGING IN THE BATHTUB.  If the bandage  gets wet, change with a clean dry gauze.  If the incision gets wet, pat the wound dry with a clean towel. °You may start showering once you are discharged home but do not submerge the incision under water. Just pat the incision dry and apply a dry gauze dressing on daily. °Change the surgical dressing daily and reapply a dry dressing each time. ° °ACTIVITY °Walk with your walker as instructed. °Use walker as long as suggested by your caregivers. °Avoid periods of inactivity such as sitting longer than an hour when not asleep. This helps prevent blood clots.  °You may resume a sexual relationship in one month or when given the OK by your doctor.  °You may return to work once you are cleared by your doctor.  °Do not drive a car for 6 weeks or until released by you surgeon.  °Do not drive while taking narcotics. ° °WEIGHT BEARING °Weight bearing as tolerated with assist device (walker, cane, etc) as directed, use it as long as suggested by your surgeon or therapist, typically at least 4-6 weeks. ° °POSTOPERATIVE CONSTIPATION PROTOCOL °Constipation - defined medically as fewer than three stools per week and severe constipation as less than one stool per week. ° °One of the most common issues patients have following surgery is constipation.  Even if you have a regular bowel pattern at home, your normal regimen is likely to be disrupted due to multiple reasons following surgery.  Combination of anesthesia, postoperative narcotics, change in appetite and fluid intake all can affect your bowels.    In order to avoid complications following surgery, here are some recommendations in order to help you during your recovery period. ° °Colace (docusate) - Pick up an over-the-counter form of Colace or another stool softener and take twice a day as long as you are requiring postoperative pain medications.  Take with a full glass of water daily.  If you experience loose stools or diarrhea, hold the colace until you stool forms back  up.  If your symptoms do not get better within 1 week or if they get worse, check with your doctor. ° °Dulcolax (bisacodyl) - Pick up over-the-counter and take as directed by the product packaging as needed to assist with the movement of your bowels.  Take with a full glass of water.  Use this product as needed if not relieved by Colace only.  ° °MiraLax (polyethylene glycol) - Pick up over-the-counter to have on hand.  MiraLax is a solution that will increase the amount of water in your bowels to assist with bowel movements.  Take as directed and can mix with a glass of water, juice, soda, coffee, or tea.  Take if you go more than two days without a movement. °Do not use MiraLax more than once per day. Call your doctor if you are still constipated or irregular after using this medication for 7 days in a row. ° °If you continue to have problems with postoperative constipation, please contact the office for further assistance and recommendations.  If you experience "the worst abdominal pain ever" or develop nausea or vomiting, please contact the office immediatly for further recommendations for treatment. ° °ITCHING ° If you experience itching with your medications, try taking only a single pain pill, or even half a pain pill at a time.  You can also use Benadryl over the counter for itching or also to help with sleep.  ° °TED HOSE STOCKINGS °Wear the elastic stockings on both legs for three weeks following surgery during the day but you may remove then at night for sleeping. ° °MEDICATIONS °See your medication summary on the “After Visit Summary” that the nursing staff will review with you prior to discharge.  You may have some home medications which will be placed on hold until you complete the course of blood thinner medication.  It is important for you to complete the blood thinner medication as prescribed by your surgeon.  Continue your approved medications as instructed at time of discharge. ° °PRECAUTIONS °If  you experience chest pain or shortness of breath - call 911 immediately for transfer to the hospital emergency department.  °If you develop a fever greater that 101 F, purulent drainage from wound, increased redness or drainage from wound, foul odor from the wound/dressing, or calf pain - CONTACT YOUR SURGEON.   °                                                °FOLLOW-UP APPOINTMENTS °Make sure you keep all of your appointments after your operation with your surgeon and caregivers. You should call the office at the above phone number and make an appointment for approximately two weeks after the date of your surgery or on the date instructed by your surgeon outlined in the "After Visit Summary". ° ° °RANGE OF MOTION AND STRENGTHENING EXERCISES  °Rehabilitation of the knee is important following a knee injury or   an operation. After just a few days of immobilization, the muscles of the thigh which control the knee become weakened and shrink (atrophy). Knee exercises are designed to build up the tone and strength of the thigh muscles and to improve knee motion. Often times heat used for twenty to thirty minutes before working out will loosen up your tissues and help with improving the range of motion but do not use heat for the first two weeks following surgery. These exercises can be done on a training (exercise) mat, on the floor, on a table or on a bed. Use what ever works the best and is most comfortable for you Knee exercises include:  °• Leg Lifts - While your knee is still immobilized in a splint or cast, you can do straight leg raises. Lift the leg to 60 degrees, hold for 3 sec, and slowly lower the leg. Repeat 10-20 times 2-3 times daily. Perform this exercise against resistance later as your knee gets better.  °• Quad and Hamstring Sets - Tighten up the muscle on the front of the thigh (Quad) and hold for 5-10 sec. Repeat this 10-20 times hourly. Hamstring sets are done by pushing the foot backward against an  object and holding for 5-10 sec. Repeat as with quad sets.  °· Leg Slides: Lying on your back, slowly slide your foot toward your buttocks, bending your knee up off the floor (only go as far as is comfortable). Then slowly slide your foot back down until your leg is flat on the floor again. °· Angel Wings: Lying on your back spread your legs to the side as far apart as you can without causing discomfort.  °A rehabilitation program following serious knee injuries can speed recovery and prevent re-injury in the future due to weakened muscles. Contact your doctor or a physical therapist for more information on knee rehabilitation.  ° °IF YOU ARE TRANSFERRED TO A SKILLED REHAB FACILITY °If the patient is transferred to a skilled rehab facility following release from the hospital, a list of the current medications will be sent to the facility for the patient to continue.  When discharged from the skilled rehab facility, please have the facility set up the patient's Home Health Physical Therapy prior to being released. Also, the skilled facility will be responsible for providing the patient with their medications at time of release from the facility to include their pain medication, the muscle relaxants, and their blood thinner medication. If the patient is still at the rehab facility at time of the two week follow up appointment, the skilled rehab facility will also need to assist the patient in arranging follow up appointment in our office and any transportation needs. ° °MAKE SURE YOU:  °• Understand these instructions.  °• Get help right away if you are not doing well or get worse.  ° ° °Pick up stool softner and laxative for home use following surgery while on pain medications. °Do not submerge incision under water. °Please use good hand washing techniques while changing dressing each day. °May shower starting three days after surgery. °Please use a clean towel to pat the incision dry following showers. °Continue to  use ice for pain and swelling after surgery. °Do not use any lotions or creams on the incision until instructed by your surgeon. ° °

## 2018-03-27 NOTE — Anesthesia Procedure Notes (Signed)
Anesthesia Regional Block: Adductor canal block   Pre-Anesthetic Checklist: ,, timeout performed, Correct Patient, Correct Site, Correct Laterality, Correct Procedure, Correct Position, site marked, Risks and benefits discussed,  Surgical consent,  Pre-op evaluation,  At surgeon's request and post-op pain management  Laterality: Left  Prep: chloraprep       Needles:  Injection technique: Single-shot  Needle Type: Echogenic Stimulator Needle     Needle Length: 5cm  Needle Gauge: 22     Additional Needles:   Procedures:, nerve stimulator,,, ultrasound used (permanent image in chart),,,,   Nerve Stimulator or Paresthesia:  Response: quadraceps contraction, 0.45 mA,   Additional Responses:   Narrative:  Start time: 03/27/2018 2:51 PM End time: 03/27/2018 2:54 PM Injection made incrementally with aspirations every 5 mL.  Performed by: Personally  Anesthesiologist: Janeece Riggers, MD  Additional Notes: Functioning IV was confirmed and monitors were applied.  A 56mm 22ga Arrow echogenic stimulator needle was used. Sterile prep and drape,hand hygiene and sterile gloves were used. Ultrasound guidance: relevant anatomy identified, needle position confirmed, local anesthetic spread visualized around nerve(s)., vascular puncture avoided.  Image printed for medical record. Negative aspiration and negative test dose prior to incremental administration of local anesthetic. The patient tolerated the procedure well.

## 2018-03-27 NOTE — Anesthesia Preprocedure Evaluation (Signed)
Anesthesia Evaluation  Patient identified by MRN, date of birth, ID band Patient awake    Reviewed: Allergy & Precautions, H&P , NPO status , Patient's Chart, lab work & pertinent test results, reviewed documented beta blocker date and time   Airway Mallampati: II  TM Distance: >3 FB Neck ROM: full    Dental no notable dental hx.    Pulmonary    Pulmonary exam normal breath sounds clear to auscultation       Cardiovascular Exercise Tolerance: Good  Rhythm:regular Rate:Normal  ECHO 3/15  Study Conclusions  - Left ventricle: The cavity size was normal. Systolic function was normal. The estimated ejection fraction was in the range of 55% to 60%. Wall motion was normal; there were no regional wall motion abnormalities. - Mitral valve: Mild regurgitation. - Atrial septum: No defect or patent foramen ovale was identified. - Pulmonary arteries: PA peak pressure: 33mm Hg (S). Transthoracic echocardiography.    Neuro/Psych  Headaches, Subarachnoid hemorrhage following injury  Subarachnoid bleed       GI/Hepatic negative GI ROS,   Endo/Other    Renal/GU   negative genitourinary   Musculoskeletal  (+) Arthritis , Osteoarthritis,    Abdominal   Peds  Hematology   Anesthesia Other Findings   Reproductive/Obstetrics negative OB ROS                            Anesthesia Physical Anesthesia Plan  ASA: II  Anesthesia Plan: Spinal   Post-op Pain Management:  Regional for Post-op pain   Induction:   PONV Risk Score and Plan: 2 and Ondansetron and Treatment may vary due to age or medical condition  Airway Management Planned: Nasal Cannula, Natural Airway and Mask  Additional Equipment:   Intra-op Plan:   Post-operative Plan:   Informed Consent: I have reviewed the patients History and Physical, chart, labs and discussed the procedure including the risks, benefits and  alternatives for the proposed anesthesia with the patient or authorized representative who has indicated his/her understanding and acceptance.   Dental Advisory Given  Plan Discussed with: Surgeon, Anesthesiologist and CRNA  Anesthesia Plan Comments: (  )       Anesthesia Quick Evaluation

## 2018-03-27 NOTE — Progress Notes (Signed)
Time out for right knee adductor block was performed at 1441 not 1341.

## 2018-03-27 NOTE — Progress Notes (Addendum)
According to pt instructions from PST, pt was supposed to stop drinking clear liquids at 8:45.  Pt stated that she continued to drink sips of water today from 9am-12pm due to dry mouth. Pt stated she didn't realize that she was not supposed to drink after 8:45.  Dr. Ambrose Pancoast informed of this information and was notified of other fluid intake prior to 8:45 including jello, black coffee (no cream or sugar), water, and juice.

## 2018-03-27 NOTE — Interval H&P Note (Signed)
History and Physical Interval Note:  03/27/2018 2:25 PM  Megan Reilly  has presented today for surgery, with the diagnosis of Osteoarthritis Right Knee  The various methods of treatment have been discussed with the patient and family. After consideration of risks, benefits and other options for treatment, the patient has consented to  Procedure(s): RIGHT TOTAL KNEE ARTHROPLASTY (Right) as a surgical intervention .  The patient's history has been reviewed, patient examined, no change in status, stable for surgery.  I have reviewed the patient's chart and labs.  Questions were answered to the patient's satisfaction.     Pilar Plate Calynn Ferrero

## 2018-03-27 NOTE — Progress Notes (Signed)
AssistedDr. Oddono with right, ultrasound guided, adductor canal block. Side rails up, monitors on throughout procedure. See vital signs in flow sheet. Tolerated Procedure well.  

## 2018-03-27 NOTE — Op Note (Signed)
OPERATIVE REPORT-TOTAL KNEE ARTHROPLASTY   Pre-operative diagnosis- Osteoarthritis  Right knee(s)  Post-operative diagnosis- Osteoarthritis Right knee(s)  Procedure-  Right  Total Knee Arthroplasty  Surgeon- Dione Plover. Kholton Coate, MD  Assistant- Theresa Duty, PA-C   Anesthesia-  Adductor canal block and spinal  EBL- 25 ml   Drains Hemovac  Tourniquet time-  Total Tourniquet Time Documented: Calf (Right) - 39 minutes Total: Calf (Right) - 39 minutes     Complications- None  Condition-PACU - hemodynamically stable.   Brief Clinical Note  Megan Reilly is a 77 y.o. year old female with end stage OA of her left knee with progressively worsening pain and dysfunction. She has constant pain, with activity and at rest and significant functional deficits with difficulties even with ADLs. She has had extensive non-op management including analgesics, injections of cortisone, and home exercise program, but remains in significant pain with significant dysfunction. Radiographs show bone on bone arthritis lateral and patellofemoral with valgus deformity. She presents now for left Total Knee Arthroplasty.    Procedure in detail---   The patient is brought into the operating room and positioned supine on the operating table. After successful administration of  Adductor canal block and spinal,   a tourniquet is placed high on the  Right thigh(s) and the lower extremity is prepped and draped in the usual sterile fashion. Time out is performed by the operating team and then the  Right lower extremity is wrapped in Esmarch, knee flexed and the tourniquet inflated to 300 mmHg.       A midline incision is made with a ten blade through the subcutaneous tissue to the level of the extensor mechanism. A fresh blade is used to make a medial parapatellar arthrotomy. Soft tissue over the proximal medial tibia is subperiosteally elevated to the joint line with a knife and into the semimembranosus bursa with a  Cobb elevator. Soft tissue over the proximal lateral tibia is elevated with attention being paid to avoiding the patellar tendon on the tibial tubercle. The patella is everted, knee flexed 90 degrees and the ACL and PCL are removed. Findings are bone on bone lateral and patellofemoral with large global osteophytes.        The drill is used to create a starting hole in the distal femur and the canal is thoroughly irrigated with sterile saline to remove the fatty contents. The 5 degree Right  valgus alignment guide is placed into the femoral canal and the distal femoral cutting block is pinned to remove 10 mm off the distal femur. Resection is made with an oscillating saw.      The tibia is subluxed forward and the menisci are removed. The extramedullary alignment guide is placed referencing proximally at the medial aspect of the tibial tubercle and distally along the second metatarsal axis and tibial crest. The block is pinned to remove 63mm off the more deficient lateral  side. Resection is made with an oscillating saw. Size 4is the most appropriate size for the tibia and the proximal tibia is prepared with the modular drill and keel punch for that size.      The femoral sizing guide is placed and size 4 is most appropriate. Rotation is marked off the epicondylar axis and confirmed by creating a rectangular flexion gap at 90 degrees. The size 4 cutting block is pinned in this rotation and the anterior, posterior and chamfer cuts are made with the oscillating saw. The intercondylar block is then placed and that cut is  made.      Trial size 4 tibial component, trial size 4 narow posterior stabilized femur and a 10  mm posterior stabilized rotating platform insert trial is placed. Full extension is achieved with excellent varus/valgus and anterior/posterior balance throughout full range of motion. The patella is everted and thickness measured to be 22  mm. Free hand resection is taken to 12 mm, a 35 template is  placed, lug holes are drilled, trial patella is placed, and it tracks normally. Osteophytes are removed off the posterior femur with the trial in place. All trials are removed and the cut bone surfaces prepared with pulsatile lavage. Cement is mixed and once ready for implantation, the size 4 tibial implant, size  4 narrow posterior stabilized femoral component, and the size 35 patella are cemented in place and the patella is held with the clamp. The trial insert is placed and the knee held in full extension. The Exparel (20 ml mixed with 60 ml saline) is injected into the extensor mechanism, posterior capsule, medial and lateral gutters and subcutaneous tissues.  All extruded cement is removed and once the cement is hard the permanent 10 mm posterior stabilized rotating platform insert is placed into the tibial tray.      The wound is copiously irrigated with saline solution and the extensor mechanism closed over a hemovac drain with #1 V-loc suture. The tourniquet is released for a total tourniquet time of 39  minutes. Flexion against gravity is 140 degrees and the patella tracks normally. Subcutaneous tissue is closed with 2.0 vicryl and subcuticular with running 4.0 Monocryl. The incision is cleaned and dried and steri-strips and a bulky sterile dressing are applied. The limb is placed into a knee immobilizer and the patient is awakened and transported to recovery in stable condition.      Please note that a surgical assistant was a medical necessity for this procedure in order to perform it in a safe and expeditious manner. Surgical assistant was necessary to retract the ligaments and vital neurovascular structures to prevent injury to them and also necessary for proper positioning of the limb to allow for anatomic placement of the prosthesis.   Dione Plover Erleen Egner, MD    03/27/2018, 4:48 PM

## 2018-03-28 ENCOUNTER — Encounter (HOSPITAL_COMMUNITY): Payer: Self-pay | Admitting: Orthopedic Surgery

## 2018-03-28 LAB — BASIC METABOLIC PANEL
Anion gap: 8 (ref 5–15)
BUN: 10 mg/dL (ref 6–20)
CO2: 24 mmol/L (ref 22–32)
CREATININE: 0.6 mg/dL (ref 0.44–1.00)
Calcium: 9.5 mg/dL (ref 8.9–10.3)
Chloride: 109 mmol/L (ref 101–111)
GFR calc Af Amer: >60 mL/min (ref >60)
GFR calc non Af Amer: >60 mL/min (ref >60)
GLUCOSE: 163 mg/dL — AB (ref 65–99)
POTASSIUM: 4.3 mmol/L (ref 3.5–5.1)
SODIUM: 141 mmol/L (ref 135–145)

## 2018-03-28 LAB — CBC
HEMATOCRIT: 35.1 % — AB (ref 36.0–46.0)
HEMOGLOBIN: 11.9 g/dL — AB (ref 12.0–15.0)
MCH: 32.8 pg (ref 26.0–34.0)
MCHC: 33.9 g/dL (ref 30.0–36.0)
MCV: 96.7 fL (ref 78.0–100.0)
Platelets: 192 10*3/uL (ref 150–400)
RBC: 3.63 MIL/uL — ABNORMAL LOW (ref 3.87–5.11)
RDW: 12.2 % (ref 11.5–15.5)
WBC: 13.2 10*3/uL — ABNORMAL HIGH (ref 4.0–10.5)

## 2018-03-28 MED ORDER — OXYCODONE HCL 5 MG PO TABS: 5.0000 mg | ORAL_TABLET | Freq: Four times a day (QID) | ORAL | 0 refills | Status: DC | PRN

## 2018-03-28 MED ORDER — METHOCARBAMOL 500 MG PO TABS: 500.0000 mg | ORAL_TABLET | Freq: Four times a day (QID) | ORAL | 0 refills | Status: DC | PRN

## 2018-03-28 MED ORDER — ASPIRIN 325 MG PO TBEC: 325.0000 mg | DELAYED_RELEASE_TABLET | Freq: Two times a day (BID) | ORAL | 0 refills | Status: AC

## 2018-03-28 MED ORDER — TRAMADOL HCL 50 MG PO TABS: 50.0000 mg | ORAL_TABLET | Freq: Four times a day (QID) | ORAL | 0 refills | Status: DC | PRN

## 2018-03-28 NOTE — Evaluation (Signed)
Physical Therapy Evaluation Patient Details Name: Megan Reilly MRN: [id number] DOB: 09-06-1941 Today's Date: 03/28/2018   History of Present Illness  77 yo female s/p R TKA 03/27/18  Clinical Impression  On eval, pt required Min assist for mobility. She walked ~70 feet with a RW. Pain rated 5/10 with activity. Discussed d/c plan-pt is hoping to leave later today (she stated she spoke with MD about discharge this a.m). Will plan to practice stair negotiation this afternoon in case MD plans to discharge pt today.     Follow Up Recommendations Follow surgeon's recommendation for DC plan and follow-up therapies    Equipment Recommendations  None recommended by PT    Recommendations for Other Services       Precautions / Restrictions Precautions Precautions: Fall Required Braces or Orthoses: Knee Immobilizer - Right Knee Immobilizer - Right: Discontinue once straight leg raise with < 10 degree lag Restrictions Weight Bearing Restrictions: No Other Position/Activity Restrictions: WBAT      Mobility  Bed Mobility Overal bed mobility: Needs Assistance             General bed mobility comments: RN assisted pt out of bed and onto bsc  Transfers Overall transfer level: Needs assistance Equipment used: Rolling walker (2 wheeled) Transfers: Sit to/from Stand Sit to Stand: Min assist         General transfer comment: VCS safety, technique, hand/LE placement. Assist to rise, steady, control descent.   Ambulation/Gait Ambulation/Gait assistance: Min assist Gait Distance (Feet): 70 Feet Assistive device: Rolling walker (2 wheeled) Gait Pattern/deviations: Step-to pattern     General Gait Details: Assist to stabilize pt throughout distance. Cues for safety, distance from RW, sequence. Pt tends to move quickly. No c/o lightheadedness.   Stairs            Wheelchair Mobility    Modified Rankin (Stroke Patients Only)       Balance Overall balance assessment: Mild  deficits observed, not formally tested                                           Pertinent Vitals/Pain Pain Assessment: 0-10 Pain Score: 5  Pain Location: R knee Pain Descriptors / Indicators: Aching;Sore Pain Intervention(s): Monitored during session;Repositioned;Ice applied    Home Living Family/patient expects to be discharged to:: Private residence Living Arrangements: Alone Available Help at Discharge: Family Type of Home: House Home Access: Stairs to enter Entrance Stairs-Rails: Right Entrance Stairs-Number of Steps: 2 Home Layout: One level Home Equipment: Grab bars - tub/shower;Walker - 2 wheels;Shower seat      Prior Function Level of Independence: Independent               Hand Dominance        Extremity/Trunk Assessment   Upper Extremity Assessment Upper Extremity Assessment: Generalized weakness    Lower Extremity Assessment Lower Extremity Assessment: Generalized weakness    Cervical / Trunk Assessment Cervical / Trunk Assessment: Kyphotic  Communication   Communication: No difficulties  Cognition Arousal/Alertness: Awake/alert Behavior During Therapy: WFL for tasks assessed/performed Overall Cognitive Status: Within Functional Limits for tasks assessed                                        General Comments      Exercises  Total Joint Exercises Ankle Circles/Pumps: AROM;Both;10 reps;Supine Quad Sets: AROM;Both;10 reps;Supine Heel Slides: AAROM;Right;10 reps;Supine Hip ABduction/ADduction: AROM;Right;10 reps;Supine Straight Leg Raises: AROM;Right;10 reps;Supine Goniometric ROM: ~10-70 degrees   Assessment/Plan    PT Assessment Patient needs continued PT services  PT Problem List Decreased strength;Decreased range of motion;Decreased balance;Decreased mobility;Decreased cognition;Decreased knowledge of use of DME;Decreased activity tolerance;Pain;Decreased safety awareness       PT Treatment  Interventions DME instruction;Gait training;Functional mobility training;Therapeutic activities;Balance training;Patient/family education;Stair training;Therapeutic exercise    PT Goals (Current goals can be found in the Care Plan section)  Acute Rehab PT Goals Patient Stated Goal: home. regain independence. PT Goal Formulation: With patient Time For Goal Achievement: 04/11/18 Potential to Achieve Goals: Good    Frequency 7X/week   Barriers to discharge        Co-evaluation               AM-PAC PT "6 Clicks" Daily Activity  Outcome Measure Difficulty turning over in bed (including adjusting bedclothes, sheets and blankets)?: A Little Difficulty moving from lying on back to sitting on the side of the bed? : A Little Difficulty sitting down on and standing up from a chair with arms (e.g., wheelchair, bedside commode, etc,.)?: A Little Help needed moving to and from a bed to chair (including a wheelchair)?: A Little Help needed walking in hospital room?: A Little Help needed climbing 3-5 steps with a railing? : A Little 6 Click Score: 18    End of Session Equipment Utilized During Treatment: Gait belt Activity Tolerance: Patient tolerated treatment well Patient left: in chair;with call bell/phone within reach   PT Visit Diagnosis: Pain;Difficulty in walking, not elsewhere classified (R26.2);Other abnormalities of gait and mobility (R26.89);Muscle weakness (generalized) (M62.81);Unsteadiness on feet (R26.81) Pain - Right/Left: Right Pain - part of body: Knee    Time: 5366-4403 PT Time Calculation (min) (ACUTE ONLY): 22 min   Charges:   PT Evaluation $PT Eval Low Complexity: 1 Low     PT G Codes:          Weston Anna, MPT Pager: [phone]

## 2018-03-28 NOTE — Progress Notes (Signed)
   Subjective: 1 Day Post-Op Procedure(s) (LRB): RIGHT TOTAL KNEE ARTHROPLASTY (Right) Patient reports pain as mild.   We will start therapy today.  Plan is to go Home after hospital stay.  Objective: Vital signs in last 24 hours: Temp:  [96 F (35.6 C)-98.2 F (36.8 C)] 97.7 F (36.5 C) (06/18 0554) Pulse Rate:  [51-86] 72 (06/18 0554) Resp:  [0-23] 16 (06/18 0554) BP: (117-149)/(54-84) 140/71 (06/18 0554) SpO2:  [89 %-100 %] 98 % (06/18 0554) Weight:  [69.9 kg (154 lb)] 69.9 kg (154 lb) (06/17 1246)  Intake/Output from previous day:  Intake/Output Summary (Last 24 hours) at 03/28/2018 1132 Last data filed at 03/28/2018 0952 Gross per 24 hour  Intake 3262.5 ml  Output 3740 ml  Net -477.5 ml    Intake/Output this shift: Total I/O In: 200 [P.O.:200] Out: 800 [Urine:800]  Labs: Recent Labs    03/28/18 0728  HGB 11.9*   Recent Labs    03/28/18 0728  WBC 13.2*  RBC 3.63*  HCT 35.1*  PLT 192   Recent Labs    03/28/18 0728  NA 141  K 4.3  CL 109  CO2 24  BUN 10  CREATININE 0.60  GLUCOSE 163*  CALCIUM 9.5   No results for input(s): LABPT, INR in the last 72 hours.  EXAM General - Patient is Alert, Appropriate and Oriented Extremity - Neurologically intact Neurovascular intact No cellulitis present Compartment soft Dressing - dressing C/D/I Motor Function - intact, moving foot and toes well on exam.  Hemovac pulled without difficulty.  Past Medical History:  Diagnosis Date  . Allergy   . Arthritis   . Blood transfusion abn reaction or complication, no procedure mishap    77 years old after tonsilectomy  . Osteopenia   . Osteoporosis 03/2017  . 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

## 2018-03-28 NOTE — Progress Notes (Signed)
Patient discharged to home with daughter. Given all belongings, instructions, prescriptions. Dtr present for all teaching. Both verbalized understanding of instructions. Escorted to pov via w/c.

## 2018-03-28 NOTE — Addendum Note (Signed)
Addendum  created 03/28/18 0801 by Lollie Sails, CRNA   Charge Capture section accepted

## 2018-03-28 NOTE — Progress Notes (Signed)
Spoke with patient and daughter at bedside. Confirmed plan for OP PT, already arranged. Has RW and 3n1. [phone]

## 2018-03-28 NOTE — Progress Notes (Signed)
Physical Therapy Treatment Patient Details Name: Megan Reilly MRN: [id number] DOB: May 15, 1941 Today's Date: 03/28/2018    History of Present Illness 77 yo female s/p R TKA 03/27/18    PT Comments    POD # 1 pm session With daughter present for "hands on" instruction.  Assisted with transfers, amb, stair  training and HEP.    Follow Up Recommendations  Follow surgeon's recommendation for DC plan and follow-up therapies     Equipment Recommendations  None recommended by PT    Recommendations for Other Services       Precautions / Restrictions Precautions Precautions: Fall Precaution Comments: pt able to perform active SLR so did not use KI Restrictions Weight Bearing Restrictions: No Other Position/Activity Restrictions: WBAT    Mobility  Bed Mobility               General bed mobility comments: OOB in recliner  Transfers Overall transfer level: Needs assistance Equipment used: Rolling walker (2 wheeled) Transfers: Sit to/from Stand Sit to Stand: Min assist         General transfer comment: VCS safety, technique, hand/LE placement. Assist to rise, steady, control descent.   Ambulation/Gait Ambulation/Gait assistance: Min guard Gait Distance (Feet): 45 Feet Assistive device: Rolling walker (2 wheeled) Gait Pattern/deviations: Step-to pattern Gait velocity: decreased   General Gait Details: 25% VC's on proper walker to self distance and safety with turns using walker throughout   Stairs Stairs: Yes Stairs assistance: Min guard;Min assist Stair Management: One rail Left;Step to pattern;Forwards;With walker Number of Stairs: 2 General stair comments: performed twice with daughter "hands on" with instruction on proper tech and safety   Wheelchair Mobility    Modified Rankin (Stroke Patients Only)       Balance                                            Cognition Arousal/Alertness: Awake/alert Behavior During Therapy: WFL  for tasks assessed/performed Overall Cognitive Status: Within Functional Limits for tasks assessed                                        Exercises   Total Knee Replacement TE's 10 reps B LE ankle pumps 10 reps towel squeezes 10 reps knee presses 10 reps heel slides  10 reps SAQ's 10 reps SLR's 10 reps ABD Followed by ICE     General Comments        Pertinent Vitals/Pain Pain Assessment: 0-10 Pain Score: 2  Pain Location: R knee Pain Descriptors / Indicators: Aching;Sore Pain Intervention(s): Monitored during session;Repositioned;Premedicated before session;Ice applied    Home Living                      Prior Function            PT Goals (current goals can now be found in the care plan section) Progress towards PT goals: Progressing toward goals    Frequency    7X/week      PT Plan Current plan remains appropriate    Co-evaluation              AM-PAC PT "6 Clicks" Daily Activity  Outcome Measure  Difficulty turning over in bed (including adjusting bedclothes, sheets and blankets)?: A Little Difficulty  moving from lying on back to sitting on the side of the bed? : A Little Difficulty sitting down on and standing up from a chair with arms (e.g., wheelchair, bedside commode, etc,.)?: A Little Help needed moving to and from a bed to chair (including a wheelchair)?: A Little Help needed walking in hospital room?: A Little Help needed climbing 3-5 steps with a railing? : A Little 6 Click Score: 18    End of Session Equipment Utilized During Treatment: Gait belt Activity Tolerance: Patient tolerated treatment well Patient left: in chair;with call bell/phone within reach Nurse Communication: (pt ready for D/C to home) PT Visit Diagnosis: Pain;Difficulty in walking, not elsewhere classified (R26.2);Other abnormalities of gait and mobility (R26.89);Muscle weakness (generalized) (M62.81);Unsteadiness on feet (R26.81) Pain -  Right/Left: Right Pain - part of body: Knee     Time: 1310-1340 PT Time Calculation (min) (ACUTE ONLY): 30 min  Charges:  $Gait Training: 8-22 mins $Therapeutic Exercise: 8-22 mins                    G Codes:       Rica Koyanagi  PTA [facility]  Acute  Rehab Pager      [phone]

## 2018-03-29 NOTE — Discharge Summary (Signed)
Physician Discharge Summary   Patient ID: Megan Reilly MRN: [id number] DOB/AGE: January 27, 1941 77 y.o.  Admit date: 03/27/2018 Discharge date: 03/28/2018  Primary Diagnosis: Osteoarthritis right knee   Admission Diagnoses:  Past Medical History:  Diagnosis Date  . Allergy   . Arthritis   . Blood transfusion abn reaction or complication, no procedure mishap    77 years old after tonsilectomy  . Osteopenia   . Osteoporosis 03/2017  . Vasovagal syncope    2 prior incidences last was in 2015 ; treated in ED , reports no recurrence since then    Discharge Diagnoses:   Principal Problem:   OA (osteoarthritis) of knee  Estimated body mass index is 22.74 kg/m as calculated from the following:   Height as of this encounter: '5\' 9"'  (1.753 m).   Weight as of this encounter: 69.9 kg (154 lb).  Procedure:  Procedure(s) (LRB): RIGHT TOTAL KNEE ARTHROPLASTY (Right)   Consults: None  HPI: Megan Reilly is a 77 y.o. year old female with end stage OA of her left knee with progressively worsening pain and dysfunction. She has constant pain, with activity and at rest and significant functional deficits with difficulties even with ADLs. She has had extensive non-op management including analgesics, injections of cortisone, and home exercise program, but remains in significant pain with significant dysfunction. Radiographs show bone on bone arthritis lateral and patellofemoral with valgus deformity. She presents now for left Total Knee Arthroplasty.  Laboratory Data: Admission on 03/27/2018, Discharged on 03/28/2018  Component Date Value Ref Range Status  . WBC 03/28/2018 13.2* 4.0 - 10.5 K/uL Final  . RBC 03/28/2018 3.63* 3.87 - 5.11 MIL/uL Final  . Hemoglobin 03/28/2018 11.9* 12.0 - 15.0 g/dL Final  . HCT 03/28/2018 35.1* 36.0 - 46.0 % Final  . MCV 03/28/2018 96.7  78.0 - 100.0 fL Final  . MCH 03/28/2018 32.8  26.0 - 34.0 pg Final  . MCHC 03/28/2018 33.9  30.0 - 36.0 g/dL Final  . RDW 03/28/2018  12.2  11.5 - 15.5 % Final  . Platelets 03/28/2018 192  150 - 400 K/uL Final   Performed at [facility], [place] [address], [place], [place] [address]  . Sodium 03/28/2018 141  135 - 145 mmol/L Final  . Potassium 03/28/2018 4.3  3.5 - 5.1 mmol/L Final  . Chloride 03/28/2018 109  101 - 111 mmol/L Final  . CO2 03/28/2018 24  22 - 32 mmol/L Final  . Glucose, Bld 03/28/2018 163* 65 - 99 mg/dL Final  . BUN 03/28/2018 10  6 - 20 mg/dL Final  . Creatinine, Ser 03/28/2018 0.60  0.44 - 1.00 mg/dL Final  . Calcium 03/28/2018 9.5  8.9 - 10.3 mg/dL Final  . GFR calc non Af Amer 03/28/2018 >60  >60 mL/min Final  . GFR calc Af Amer 03/28/2018 >60  >60 mL/min Final   Comment: (NOTE) The eGFR has been calculated using the CKD EPI equation. This calculation has not been validated in all clinical situations. eGFR's persistently <60 mL/min signify possible Chronic Kidney Disease.   Georgiann Hahn gap 03/28/2018 8  5 - 15 Final   Performed at [facility], [place] [address], [place],  [address]  Hospital Outpatient Visit on 03/21/2018  Component Date Value Ref Range Status  . Color, Urine 03/21/2018 STRAW* YELLOW Final  . APPearance 03/21/2018 HAZY* CLEAR Final  . Specific Gravity, Urine 03/21/2018 1.004* 1.005 - 1.030 Final  . pH 03/21/2018 6.0  5.0 - 8.0 Final  .  Glucose, UA 03/21/2018 NEGATIVE  NEGATIVE mg/dL Final  . Hgb urine dipstick 03/21/2018 NEGATIVE  NEGATIVE Final  . Bilirubin Urine 03/21/2018 NEGATIVE  NEGATIVE Final  . Ketones, ur 03/21/2018 NEGATIVE  NEGATIVE mg/dL Final  . Protein, ur 03/21/2018 NEGATIVE  NEGATIVE mg/dL Final  . Nitrite 03/21/2018 NEGATIVE  NEGATIVE Final  . Leukocytes, UA 03/21/2018 NEGATIVE  NEGATIVE Final   Performed at [place] [address], [place], [place] [address]  . aPTT 03/21/2018 32  24 - 36 seconds Final   Performed at [facility], [place] [address] [place], [place] [address]  .  WBC 03/21/2018 7.1  4.0 - 10.5 K/uL Final  . RBC 03/21/2018 4.23  3.87 - 5.11 MIL/uL Final  . Hemoglobin 03/21/2018 13.8  12.0 - 15.0 g/dL Final  . HCT 03/21/2018 41.2  36.0 - 46.0 % Final  . MCV 03/21/2018 97.4  78.0 - 100.0 fL Final  . MCH 03/21/2018 32.6  26.0 - 34.0 pg Final  . MCHC 03/21/2018 33.5  30.0 - 36.0 g/dL Final  . RDW 03/21/2018 12.5  11.5 - 15.5 % Final  . Platelets 03/21/2018 186  150 - 400 K/uL Final   Performed at [facility], [place] [address] [place], [place] [address]  . Sodium 03/21/2018 143  135 - 145 mmol/L Final  . Potassium 03/21/2018 4.5  3.5 - 5.1 mmol/L Final  . Chloride 03/21/2018 107  101 - 111 mmol/L Final  . CO2 03/21/2018 29  22 - 32 mmol/L Final  . Glucose, Bld 03/21/2018 109* 65 - 99 mg/dL Final  . BUN 03/21/2018 17  6 - 20 mg/dL Final  . Creatinine, Ser 03/21/2018 0.55  0.44 - 1.00 mg/dL Final  . Calcium 03/21/2018 9.9  8.9 - 10.3 mg/dL Final  . Total Protein 03/21/2018 6.8  6.5 - 8.1 g/dL Final  . Albumin 03/21/2018 4.3  3.5 - 5.0 g/dL Final  . AST 03/21/2018 20  15 - 41 U/L Final  . ALT 03/21/2018 15  14 - 54 U/L Final  . Alkaline Phosphatase 03/21/2018 53  38 - 126 U/L Final  . Total Bilirubin 03/21/2018 0.9  0.3 - 1.2 mg/dL Final  . GFR calc non Af Amer 03/21/2018 >60  >60 mL/min Final  . GFR calc Af Amer 03/21/2018 >60  >60 mL/min Final   Comment: (NOTE) The eGFR has been calculated using the CKD EPI equation. This calculation has not been validated in all clinical situations. eGFR's persistently <60 mL/min signify possible Chronic Kidney Disease.   Georgiann Hahn gap 03/21/2018 7  5 - 15 Final   Performed at [facility], [place] [address], [place], [place] [address]  . Prothrombin Time 03/21/2018 12.9  11.4 - 15.2 seconds Final  . INR 03/21/2018 0.98   Final   Performed at [facility], [place] [address], [place], [place] [address]  . ABO/RH(D) 03/21/2018 A POS   Final  . Antibody Screen  03/21/2018 NEG   Final  . Sample Expiration 03/21/2018 03/30/2018   Final  . Extend sample reason 03/21/2018    Final                   Value:NO TRANSFUSIONS OR PREGNANCY IN THE PAST 3 MONTHS Performed at [facility], [place] [address], [place],  [address]   . MRSA, PCR 03/21/2018 NEGATIVE  NEGATIVE Final  . Staphylococcus aureus 03/21/2018 NEGATIVE  NEGATIVE Final   Comment: (NOTE) The  Xpert SA Assay (FDA approved for NASAL specimens in patients 41 years of age and older), is one component of a comprehensive surveillance program. It is not intended to diagnose infection nor to guide or monitor treatment. Performed at [facility], [place] [address], [place], [place] [address]   . ABO/RH(D) 03/21/2018    Final                   Value:A POS Performed at [facility], [place] [address] [place], [place] [address]      X-Rays:No results found.  EKG: Orders placed or performed in visit on 01/30/18  . EKG 12-Lead     Hospital Course: Megan Reilly is a 77 y.o. who was admitted to [facility]. They were brought to the operating room on 03/27/2018 and underwent Procedure(s): RIGHT TOTAL KNEE ARTHROPLASTY.  Patient tolerated the procedure well and was later transferred to the recovery room and then to the orthopaedic floor for postoperative care.  They were given PO and IV analgesics for pain control following their surgery.  They were given 24 hours of postoperative antibiotics of  Anti-infectives (From admission, onward)   Start     Dose/Rate Route Frequency Ordered Stop   03/28/18 0600  ceFAZolin (ANCEF) IVPB 2g/100 mL premix     2 g 200 mL/hr over 30 Minutes Intravenous On call to O.R. 03/27/18 1214 03/28/18 0630   03/27/18 2300  ceFAZolin (ANCEF) IVPB 1 g/50 mL premix     1 g 100 mL/hr over 30 Minutes Intravenous Every 6 hours 03/27/18 1903 03/28/18 0600     and started on DVT prophylaxis in the form of Aspirin.   PT  and OT were ordered for total joint protocol.  Discharge planning consulted to help with postop disposition and equipment needs.  Patient had a good night on the evening of surgery. They started to get up OOB with therapy on day one. Patient was seen in round and was ready to go home. Hemovac drain was pulled without difficulty. Continued to work with therapy that day, was meeting all of her goals and was stable. Pt was discharged to home later that afternoon. Instructed patient that dressing could be changed on POD #2 and that she may shower on POD #3.   Diet: Regular diet Activity: WBAT Follow-up: in 2 weeks Disposition - Home with outpatient physical therapy Discharged Condition: stable   Discharge Instructions    Call MD / Call 911   Complete by:  As directed    If you experience chest pain or shortness of breath, CALL 911 and be transported to the hospital emergency room.  If you develope a fever above 101 F, pus (white drainage) or increased drainage or redness at the wound, or calf pain, call your surgeon's office.   Change dressing   Complete by:  As directed    Change dressing on Wednesday (03/29/18), then change the dressing daily with sterile 4 x 4 inch gauze dressing and apply TED hose.  You may clean the incision with alcohol prior to redressing.   Constipation Prevention   Complete by:  As directed    Drink plenty of fluids.  Prune juice may be helpful.  You may use a stool softener, such as Colace (over the counter) 100 mg twice a day.  Use MiraLax (over the counter) for constipation as needed.   Diet - low sodium heart healthy   Complete by:  As directed  Discharge instructions   Complete by:  As directed    Dr. Gaynelle Arabian Total Joint Specialist [facility] [address] [place], [place] [address] [phone]  TOTAL KNEE REPLACEMENT POSTOPERATIVE DIRECTIONS  Knee Rehabilitation, Guidelines Following Surgery  Results after knee surgery are often  greatly improved when you follow the exercise, range of motion and muscle strengthening exercises prescribed by your doctor. Safety measures are also important to protect the knee from further injury. Any time any of these exercises cause you to have increased pain or swelling in your knee joint, decrease the amount until you are comfortable again and slowly increase them. If you have problems or questions, call your caregiver or physical therapist for advice.   HOME CARE INSTRUCTIONS  Remove items at home which could result in a fall. This includes throw rugs or furniture in walking pathways.  ICE to the affected knee every three hours for 30 minutes at a time and then as needed for pain and swelling.  Continue to use ice on the knee for pain and swelling from surgery. You may notice swelling that will progress down to the foot and ankle.  This is normal after surgery.  Elevate the leg when you are not up walking on it.   Continue to use the breathing machine which will help keep your temperature down.  It is common for your temperature to cycle up and down following surgery, especially at night when you are not up moving around and exerting yourself.  The breathing machine keeps your lungs expanded and your temperature down. Do not place pillow under knee, focus on keeping the knee straight while resting   DIET You may resume your previous home diet once your are discharged from the hospital.  DRESSING / WOUND CARE / SHOWERING You may shower 3 days after surgery, but keep the wounds dry during showering.  You may use an occlusive plastic wrap (Press'n Seal for example), NO SOAKING/SUBMERGING IN THE BATHTUB.  If the bandage gets wet, change with a clean dry gauze.  If the incision gets wet, pat the wound dry with a clean towel. You may start showering once you are discharged home but do not submerge the incision under water. Just pat the incision dry and apply a dry gauze dressing on daily. Change the  surgical dressing daily and reapply a dry dressing each time.  ACTIVITY Walk with your walker as instructed. Use walker as long as suggested by your caregivers. Avoid periods of inactivity such as sitting longer than an hour when not asleep. This helps prevent blood clots.  You may resume a sexual relationship in one month or when given the OK by your doctor.  You may return to work once you are cleared by your doctor.  Do not drive a car for 6 weeks or until released by you surgeon.  Do not drive while taking narcotics.  WEIGHT BEARING Weight bearing as tolerated with assist device (walker, cane, etc) as directed, use it as long as suggested by your surgeon or therapist, typically at least 4-6 weeks.  POSTOPERATIVE CONSTIPATION PROTOCOL Constipation - defined medically as fewer than three stools per week and severe constipation as less than one stool per week.  One of the most common issues patients have following surgery is constipation.  Even if you have a regular bowel pattern at home, your normal regimen is likely to be disrupted due to multiple reasons following surgery.  Combination of anesthesia, postoperative narcotics, change in  appetite and fluid intake all can affect your bowels.  In order to avoid complications following surgery, here are some recommendations in order to help you during your recovery period.  Colace (docusate) - Pick up an over-the-counter form of Colace or another stool softener and take twice a day as long as you are requiring postoperative pain medications.  Take with a full glass of water daily.  If you experience loose stools or diarrhea, hold the colace until you stool forms back up.  If your symptoms do not get better within 1 week or if they get worse, check with your doctor.  Dulcolax (bisacodyl) - Pick up over-the-counter and take as directed by the product packaging as needed to assist with the movement of your bowels.  Take with a full glass of water.   Use this product as needed if not relieved by Colace only.   MiraLax (polyethylene glycol) - Pick up over-the-counter to have on hand.  MiraLax is a solution that will increase the amount of water in your bowels to assist with bowel movements.  Take as directed and can mix with a glass of water, juice, soda, coffee, or tea.  Take if you go more than two days without a movement. Do not use MiraLax more than once per day. Call your doctor if you are still constipated or irregular after using this medication for 7 days in a row.  If you continue to have problems with postoperative constipation, please contact the office for further assistance and recommendations.  If you experience "the worst abdominal pain ever" or develop nausea or vomiting, please contact the office immediatly for further recommendations for treatment.  ITCHING  If you experience itching with your medications, try taking only a single pain pill, or even half a pain pill at a time.  You can also use Benadryl over the counter for itching or also to help with sleep.   TED HOSE STOCKINGS Wear the elastic stockings on both legs for three weeks following surgery during the day but you may remove then at night for sleeping.  MEDICATIONS See your medication summary on the "After Visit Summary" that the nursing staff will review with you prior to discharge.  You may have some home medications which will be placed on hold until you complete the course of blood thinner medication.  It is important for you to complete the blood thinner medication as prescribed by your surgeon.  Continue your approved medications as instructed at time of discharge.  PRECAUTIONS If you experience chest pain or shortness of breath - call 911 immediately for transfer to the hospital emergency department.  If you develop a fever greater that 101 F, purulent drainage from wound, increased redness or drainage from wound, foul odor from the wound/dressing, or calf  pain - CONTACT YOUR SURGEON.                                                   FOLLOW-UP APPOINTMENTS Make sure you keep all of your appointments after your operation with your surgeon and caregivers. You should call the office at the above phone number and make an appointment for approximately two weeks after the date of your surgery or on the date instructed by your surgeon outlined in the "After Visit Summary".   RANGE OF MOTION AND STRENGTHENING EXERCISES  Rehabilitation  of the knee is important following a knee injury or an operation. After just a few days of immobilization, the muscles of the thigh which control the knee become weakened and shrink (atrophy). Knee exercises are designed to build up the tone and strength of the thigh muscles and to improve knee motion. Often times heat used for twenty to thirty minutes before working out will loosen up your tissues and help with improving the range of motion but do not use heat for the first two weeks following surgery. These exercises can be done on a training (exercise) mat, on the floor, on a table or on a bed. Use what ever works the best and is most comfortable for you Knee exercises include:  Leg Lifts - While your knee is still immobilized in a splint or cast, you can do straight leg raises. Lift the leg to 60 degrees, hold for 3 sec, and slowly lower the leg. Repeat 10-20 times 2-3 times daily. Perform this exercise against resistance later as your knee gets better.  Quad and Hamstring Sets - Tighten up the muscle on the front of the thigh (Quad) and hold for 5-10 sec. Repeat this 10-20 times hourly. Hamstring sets are done by pushing the foot backward against an object and holding for 5-10 sec. Repeat as with quad sets.  Leg Slides: Lying on your back, slowly slide your foot toward your buttocks, bending your knee up off the floor (only go as far as is comfortable). Then slowly slide your foot back down until your leg is flat on the floor  again. Angel Wings: Lying on your back spread your legs to the side as far apart as you can without causing discomfort.  A rehabilitation program following serious knee injuries can speed recovery and prevent re-injury in the future due to weakened muscles. Contact your doctor or a physical therapist for more information on knee rehabilitation.   IF YOU ARE TRANSFERRED TO A SKILLED REHAB FACILITY If the patient is transferred to a skilled rehab facility following release from the hospital, a list of the current medications will be sent to the facility for the patient to continue.  When discharged from the skilled rehab facility, please have the facility set up the patient's [place] prior to being released. Also, the skilled facility will be responsible for providing the patient with their medications at time of release from the facility to include their pain medication, the muscle relaxants, and their blood thinner medication. If the patient is still at the rehab facility at time of the two week follow up appointment, the skilled rehab facility will also need to assist the patient in arranging follow up appointment in our office and any transportation needs.  MAKE SURE YOU:  Understand these instructions.  Get help right away if you are not doing well or get worse.    Pick up stool softner and laxative for home use following surgery while on pain medications. Do not submerge incision under water. Please use good hand washing techniques while changing dressing each day. May shower starting three days after surgery. Please use a clean towel to pat the incision dry following showers. Continue to use ice for pain and swelling after surgery. Do not use any lotions or creams on the incision until instructed by your surgeon.   Do not put a pillow under the knee. Place it under the heel.   Complete by:  As directed    Driving restrictions   Complete  by:  As directed    No driving  for two weeks   TED hose   Complete by:  As directed    Use stockings (TED hose) for three weeks on both leg(s).  You may remove them at night for sleeping.   Weight bearing as tolerated   Complete by:  As directed      Allergies as of 03/28/2018   No Known Allergies     Medication List    TAKE these medications   acetaminophen 500 MG tablet Commonly known as:  TYLENOL Take 500 mg by mouth as needed.   alendronate 70 MG tablet Commonly known as:  FOSAMAX Take 1 tablet (70 mg total) by mouth every 7 (seven) days. Take with a full glass of water on an empty stomach. What changed:    when to take this  additional instructions   aspirin 325 MG EC tablet Take 1 tablet (325 mg total) by mouth 2 (two) times daily for 20 days. Take one tablet (325 mg) Aspirin two times a day for three weeks following surgery to prevent blood clots. Then take one baby Aspirin (81 mg) once a day for three weeks. Then discontinue aspirin.   calcium carbonate 600 MG Tabs tablet Commonly known as:  OS-CAL Take 300 mg by mouth every other day.   cetirizine 10 MG tablet Commonly known as:  ZYRTEC Take 10 mg by mouth daily as needed for allergies.   fish oil-omega-3 fatty acids 1000 MG capsule Take 1 g by mouth daily.   Magnesium 400 MG Caps Take 1 tablet by mouth daily.   Melatonin 3 MG Tabs Take 3 mg by mouth every other day.   methocarbamol 500 MG tablet Commonly known as:  ROBAXIN Take 1 tablet (500 mg total) by mouth every 6 (six) hours as needed for muscle spasms.   multivitamin with minerals Tabs tablet Take 1 tablet by mouth every morning.   oxyCODONE 5 MG immediate release tablet Commonly known as:  Oxy IR/ROXICODONE Take 1-2 tablets (5-10 mg total) by mouth every 6 (six) hours as needed for moderate pain or severe pain.   traMADol 50 MG tablet Commonly known as:  ULTRAM Take 1-2 tablets (50-100 mg total) by mouth every 6 (six) hours as needed for moderate pain (for pain  unresponsive to oxycodone).   Turmeric 500 MG Tabs Take 500 mg by mouth daily.   VITAMIN D-3 PO Take 2,500 Units by mouth daily.            Discharge Care Instructions  (From admission, onward)        Start     Ordered   03/28/18 0000  Weight bearing as tolerated     03/28/18 1415   03/28/18 0000  Change dressing    Comments:  Change dressing on Wednesday (03/29/18), then change the dressing daily with sterile 4 x 4 inch gauze dressing and apply TED hose.  You may clean the incision with alcohol prior to redressing.   03/28/18 1415     Follow-up Information    Gaynelle Arabian, MD Follow up in 2 week(s).   Specialty:  Orthopedic Surgery Contact information: [address] [place] [address] [phone]           Signed: Theresa Duty, PA-C Orthopaedic Surgery 03/29/2018, 1:49 PM

## 2018-03-30 DIAGNOSIS — M25661 Stiffness of right knee, not elsewhere classified: Secondary | ICD-10-CM | POA: Insufficient documentation

## 2018-04-03 DIAGNOSIS — M25661 Stiffness of right knee, not elsewhere classified: Secondary | ICD-10-CM | POA: Diagnosis not present

## 2018-04-05 DIAGNOSIS — M25661 Stiffness of right knee, not elsewhere classified: Secondary | ICD-10-CM | POA: Diagnosis not present

## 2018-04-07 DIAGNOSIS — M25661 Stiffness of right knee, not elsewhere classified: Secondary | ICD-10-CM | POA: Diagnosis not present

## 2018-04-10 DIAGNOSIS — M25661 Stiffness of right knee, not elsewhere classified: Secondary | ICD-10-CM | POA: Diagnosis not present

## 2018-04-12 DIAGNOSIS — M25661 Stiffness of right knee, not elsewhere classified: Secondary | ICD-10-CM | POA: Diagnosis not present

## 2018-04-14 DIAGNOSIS — M25661 Stiffness of right knee, not elsewhere classified: Secondary | ICD-10-CM | POA: Diagnosis not present

## 2018-04-18 DIAGNOSIS — M25661 Stiffness of right knee, not elsewhere classified: Secondary | ICD-10-CM | POA: Diagnosis not present

## 2018-04-20 DIAGNOSIS — M25661 Stiffness of right knee, not elsewhere classified: Secondary | ICD-10-CM | POA: Diagnosis not present

## 2018-04-25 DIAGNOSIS — M25661 Stiffness of right knee, not elsewhere classified: Secondary | ICD-10-CM | POA: Diagnosis not present

## 2018-04-27 DIAGNOSIS — M25661 Stiffness of right knee, not elsewhere classified: Secondary | ICD-10-CM | POA: Diagnosis not present

## 2018-04-28 DIAGNOSIS — Z471 Aftercare following joint replacement surgery: Secondary | ICD-10-CM | POA: Diagnosis not present

## 2018-04-28 DIAGNOSIS — Z96651 Presence of right artificial knee joint: Secondary | ICD-10-CM | POA: Diagnosis not present

## 2018-05-09 DIAGNOSIS — M25661 Stiffness of right knee, not elsewhere classified: Secondary | ICD-10-CM | POA: Diagnosis not present

## 2018-05-16 DIAGNOSIS — M25661 Stiffness of right knee, not elsewhere classified: Secondary | ICD-10-CM | POA: Diagnosis not present

## 2018-05-19 DIAGNOSIS — M25661 Stiffness of right knee, not elsewhere classified: Secondary | ICD-10-CM | POA: Diagnosis not present

## 2018-05-23 DIAGNOSIS — M25661 Stiffness of right knee, not elsewhere classified: Secondary | ICD-10-CM | POA: Diagnosis not present

## 2018-05-30 DIAGNOSIS — Z96659 Presence of unspecified artificial knee joint: Secondary | ICD-10-CM | POA: Insufficient documentation

## 2018-07-03 DIAGNOSIS — R69 Illness, unspecified: Secondary | ICD-10-CM | POA: Diagnosis not present

## 2018-07-26 DIAGNOSIS — H5213 Myopia, bilateral: Secondary | ICD-10-CM | POA: Diagnosis not present

## 2018-07-26 DIAGNOSIS — R69 Illness, unspecified: Secondary | ICD-10-CM | POA: Diagnosis not present

## 2018-08-01 ENCOUNTER — Other Ambulatory Visit: Payer: Self-pay | Admitting: Dermatology

## 2018-08-01 DIAGNOSIS — D2372 Other benign neoplasm of skin of left lower limb, including hip: Secondary | ICD-10-CM | POA: Diagnosis not present

## 2018-08-01 DIAGNOSIS — D229 Melanocytic nevi, unspecified: Secondary | ICD-10-CM | POA: Diagnosis not present

## 2018-08-01 DIAGNOSIS — D485 Neoplasm of uncertain behavior of skin: Secondary | ICD-10-CM | POA: Diagnosis not present

## 2018-08-01 DIAGNOSIS — L570 Actinic keratosis: Secondary | ICD-10-CM | POA: Diagnosis not present

## 2018-09-25 ENCOUNTER — Other Ambulatory Visit: Payer: Self-pay | Admitting: Family Medicine

## 2018-10-13 ENCOUNTER — Other Ambulatory Visit: Payer: Self-pay

## 2018-10-13 ENCOUNTER — Encounter: Payer: Self-pay | Admitting: Family Medicine

## 2018-10-13 ENCOUNTER — Ambulatory Visit (INDEPENDENT_AMBULATORY_CARE_PROVIDER_SITE_OTHER): Payer: Medicare HMO | Admitting: Family Medicine

## 2018-10-13 ENCOUNTER — Ambulatory Visit: Payer: Medicare HMO

## 2018-10-13 VITALS — BP 122/74 | HR 72 | Temp 98.0°F | Ht 66.5 in | Wt 152.7 lb

## 2018-10-13 DIAGNOSIS — Z0000 Encounter for general adult medical examination without abnormal findings: Secondary | ICD-10-CM | POA: Diagnosis not present

## 2018-10-13 LAB — LIPID PANEL
Cholesterol: 206 mg/dL — ABNORMAL HIGH (ref 0–200)
HDL: 63.1 mg/dL (ref >39.00)
LDL Cholesterol: 127 mg/dL — ABNORMAL HIGH (ref 0–99)
NONHDL: 143.2
Total CHOL/HDL Ratio: 3
Triglycerides: 82 mg/dL (ref 0.0–149.0)
VLDL: 16.4 mg/dL (ref 0.0–40.0)

## 2018-10-13 LAB — BASIC METABOLIC PANEL
BUN: 15 mg/dL (ref 6–23)
CALCIUM: 10.4 mg/dL (ref 8.4–10.5)
CO2: 29 meq/L (ref 19–32)
Chloride: 105 mEq/L (ref 96–112)
Creatinine, Ser: 0.63 mg/dL (ref 0.40–1.20)
GFR: 97.19 mL/min (ref >60.00)
Glucose, Bld: 97 mg/dL (ref 70–99)
Potassium: 4.5 mEq/L (ref 3.5–5.1)
SODIUM: 141 meq/L (ref 135–145)

## 2018-10-13 LAB — CBC WITH DIFFERENTIAL/PLATELET
BASOS ABS: 0 10*3/uL (ref 0.0–0.1)
Basophils Relative: 0.8 % (ref 0.0–3.0)
Eosinophils Absolute: 0.2 10*3/uL (ref 0.0–0.7)
Eosinophils Relative: 3.8 % (ref 0.0–5.0)
HCT: 42.9 % (ref 36.0–46.0)
Hemoglobin: 14.2 g/dL (ref 12.0–15.0)
LYMPHS ABS: 1.7 10*3/uL (ref 0.7–4.0)
Lymphocytes Relative: 30.6 % (ref 12.0–46.0)
MCHC: 33.1 g/dL (ref 30.0–36.0)
MCV: 96.7 fl (ref 78.0–100.0)
Monocytes Absolute: 0.6 10*3/uL (ref 0.1–1.0)
Monocytes Relative: 10.5 % (ref 3.0–12.0)
NEUTROS PCT: 54.3 % (ref 43.0–77.0)
Neutro Abs: 3.1 10*3/uL (ref 1.4–7.7)
Platelets: 197 10*3/uL (ref 150.0–400.0)
RBC: 4.44 Mil/uL (ref 3.87–5.11)
RDW: 13.2 % (ref 11.5–15.5)
WBC: 5.7 10*3/uL (ref 4.0–10.5)

## 2018-10-13 LAB — HEPATIC FUNCTION PANEL
ALK PHOS: 46 U/L (ref 39–117)
ALT: 11 U/L (ref 0–35)
AST: 18 U/L (ref 0–37)
Albumin: 4.3 g/dL (ref 3.5–5.2)
BILIRUBIN DIRECT: 0.1 mg/dL (ref 0.0–0.3)
BILIRUBIN TOTAL: 0.7 mg/dL (ref 0.2–1.2)
Total Protein: 6.2 g/dL (ref 6.0–8.3)

## 2018-10-13 LAB — TSH: TSH: 3.38 u[IU]/mL (ref 0.35–4.50)

## 2018-10-13 MED ORDER — ALENDRONATE SODIUM 70 MG PO TABS: ORAL_TABLET | ORAL | 3 refills | Status: DC

## 2018-10-13 NOTE — Patient Instructions (Signed)
Preventive Care 78 Years and Older, Female Preventive care refers to lifestyle choices and visits with your health care provider that can promote health and wellness. What does preventive care include?  A yearly physical exam. This is also called an annual well check.  Dental exams once or twice a year.  Routine eye exams. Ask your health care provider how often you should have your eyes checked.  Personal lifestyle choices, including: ? Daily care of your teeth and gums. ? Regular physical activity. ? Eating a healthy diet. ? Avoiding tobacco and drug use. ? Limiting alcohol use. ? Practicing safe sex. ? Taking low-dose aspirin every day. ? Taking vitamin and mineral supplements as recommended by your health care provider. What happens during an annual well check? The services and screenings done by your health care provider during your annual well check will depend on your age, overall health, lifestyle risk factors, and family history of disease. Counseling Your health care provider may ask you questions about your:  Alcohol use.  Tobacco use.  Drug use.  Emotional well-being.  Home and relationship well-being.  Sexual activity.  Eating habits.  History of falls.  Memory and ability to understand (cognition).  Work and work Statistician.  Reproductive health.  Screening You may have the following tests or measurements:  Height, weight, and BMI.  Blood pressure.  Lipid and cholesterol levels. These may be checked every 5 years, or more frequently if you are over 30 years old.  Skin check.  Lung cancer screening. You may have this screening every year starting at age 27 if you have a 30-pack-year history of smoking and currently smoke or have quit within the past 15 years.  Colorectal cancer screening. All adults should have this screening starting at age 33 and continuing until age 46. You will have tests every 1-10 years, depending on your results and the  type of screening test. People at increased risk should start screening at an earlier age. Screening tests may include: ? Guaiac-based fecal occult blood testing. ? Fecal immunochemical test (FIT). ? Stool DNA test. ? Virtual colonoscopy. ? Sigmoidoscopy. During this test, a flexible tube with a tiny camera (sigmoidoscope) is used to examine your rectum and lower colon. The sigmoidoscope is inserted through your anus into your rectum and lower colon. ? Colonoscopy. During this test, a long, thin, flexible tube with a tiny camera (colonoscope) is used to examine your entire colon and rectum.  Hepatitis C blood test.  Hepatitis B blood test.  Sexually transmitted disease (STD) testing.  Diabetes screening. This is done by checking your blood sugar (glucose) after you have not eaten for a while (fasting). You may have this done every 1-3 years.  Bone density scan. This is done to screen for osteoporosis. You may have this done starting at age 37.  Mammogram. This may be done every 1-2 years. Talk to your health care provider about how often you should have regular mammograms. Talk with your health care provider about your test results, treatment options, and if necessary, the need for more tests. Vaccines Your health care provider may recommend certain vaccines, such as:  Influenza vaccine. This is recommended every year.  Tetanus, diphtheria, and acellular pertussis (Tdap, Td) vaccine. You may need a Td booster every 10 years.  Varicella vaccine. You may need this if you have not been vaccinated.  Zoster vaccine. You may need this after age 38.  Measles, mumps, and rubella (MMR) vaccine. You may need at least  one dose of MMR if you were born in 1957 or later. You may also need a second dose.  Pneumococcal 13-valent conjugate (PCV13) vaccine. One dose is recommended after age 24.  Pneumococcal polysaccharide (PPSV23) vaccine. One dose is recommended after age 24.  Meningococcal  vaccine. You may need this if you have certain conditions.  Hepatitis A vaccine. You may need this if you have certain conditions or if you travel or work in places where you may be exposed to hepatitis A.  Hepatitis B vaccine. You may need this if you have certain conditions or if you travel or work in places where you may be exposed to hepatitis B.  Haemophilus influenzae type b (Hib) vaccine. You may need this if you have certain conditions. Talk to your health care provider about which screenings and vaccines you need and how often you need them. This information is not intended to replace advice given to you by your health care provider. Make sure you discuss any questions you have with your health care provider. Document Released: 10/24/2015 Document Revised: 11/17/2017 Document Reviewed: 07/29/2015 Elsevier Interactive Patient Education  2019 [employer].

## 2018-10-13 NOTE — Progress Notes (Signed)
Subjective:     Patient ID: Megan Reilly, female   DOB: [birth year], 78 y.o.   MRN: [id number]  HPI Patient seen for physical exam.  She has had knee replacement since last year and has done very well following that.  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  No recent falls.  Mood is stable.  She takes Fosamax for osteoporosis and is doing well with that.  She was placed on this last year.  She is scheduled for repeat mammography in April.  She plans to get follow-up DEXA scan this year.  She had recent shingles vaccine.  Colonoscopy 2016 normal we will not get any further colonoscopies.  Tetanus up-to-date.  She had previous pneumonia vaccines.  Takes melatonin for insomnia generally sleeps about 8 hours per night.  Past Medical History:  Diagnosis Date  . Allergy   . Arthritis   . Blood transfusion abn reaction or complication, no procedure mishap    78 years old after tonsilectomy  . Osteopenia   . Osteoporosis 03/2017  . Vasovagal syncope    2 prior incidences last was in 2015 ; treated in ED , reports no recurrence since then    Past Surgical History:  Procedure Laterality Date  . ABDOMINAL HYSTERECTOMY     partial 1983  . COLONOSCOPY    . FOOT SURGERY     bunectomy, hammertoe, bil  . TONSILLECTOMY  1945  . 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 She has never used smokeless tobacco. She reports current alcohol use of about 5.0 standard drinks of alcohol per week. She reports that she does not use drugs. family history includes Breast cancer in her maternal aunt; Diabetes in her father; Heart disease in her mother; Heart disease (age of onset: 64) in her father; Heart failure in her mother; Hyperlipidemia in her father and mother; Stroke in her mother. No Known Allergies   Review of  Systems  Constitutional: Negative for activity change, appetite change, fatigue, fever and unexpected weight change.  HENT: Negative for ear pain, hearing loss, sore throat and trouble swallowing.   Eyes: Negative for visual disturbance.  Respiratory: Negative for cough and shortness of breath.   Cardiovascular: Negative for chest pain and palpitations.  Gastrointestinal: Negative for abdominal pain, blood in stool, constipation and diarrhea.  Genitourinary: Negative for dysuria and hematuria.  Musculoskeletal: Negative for arthralgias, back pain and myalgias.  Skin: Negative for rash.  Neurological: Negative for dizziness, syncope and headaches.  Hematological: Negative for adenopathy.  Psychiatric/Behavioral: Negative for confusion and dysphoric mood.       Objective:   Physical Exam Constitutional:      Appearance: She is well-developed.  HENT:     Head: Normocephalic and atraumatic.  Eyes:     Pupils: Pupils are equal, round, and reactive to light.  Neck:     Musculoskeletal: Normal range of motion and neck supple.     Thyroid: No thyromegaly.  Cardiovascular:     Rate and Rhythm: Normal rate and regular rhythm.     Heart sounds: Normal heart sounds. No murmur.  Pulmonary:     Effort: No respiratory distress.     Breath sounds: Normal breath sounds. No wheezing or rales.  Abdominal:     General: Bowel sounds are normal. There is no  distension.     Palpations: Abdomen is soft. There is no mass.     Tenderness: There is no abdominal tenderness. There is no guarding or rebound.  Musculoskeletal: Normal range of motion.  Lymphadenopathy:     Cervical: No cervical adenopathy.  Skin:    Findings: No rash.  Neurological:     Mental Status: She is alert and oriented to person, place, and time.     Cranial Nerves: No cranial nerve deficit.     Deep Tendon Reflexes: Reflexes normal.  Psychiatric:        Behavior: Behavior normal.        Thought Content: Thought content  normal.        Judgment: Judgment normal.        Assessment:     Physical exam.  The following health maintenance issues were addressed    Plan:     -She will continue with yearly flu vaccine and yearly mammography -No further colonoscopies indicated -Other vaccines all up-to-date -Obtain screening labs -Refilled Fosamax for 1 year.  Recommend follow-up DEXA scan later this year -Continue daily calcium and vitamin D supplementation  Eulas Post MD [facility]

## 2018-10-18 ENCOUNTER — Telehealth: Payer: Self-pay | Admitting: Family Medicine

## 2018-10-18 NOTE — Telephone Encounter (Signed)
Reviewed lab results and physician's note with patient. Answered questions,

## 2018-10-24 ENCOUNTER — Other Ambulatory Visit: Payer: Self-pay | Admitting: Family Medicine

## 2018-12-04 ENCOUNTER — Other Ambulatory Visit: Payer: Self-pay | Admitting: Family Medicine

## 2018-12-04 DIAGNOSIS — Z1231 Encounter for screening mammogram for malignant neoplasm of breast: Secondary | ICD-10-CM

## 2018-12-05 ENCOUNTER — Ambulatory Visit (INDEPENDENT_AMBULATORY_CARE_PROVIDER_SITE_OTHER): Payer: Medicare HMO | Admitting: Family Medicine

## 2018-12-05 ENCOUNTER — Other Ambulatory Visit: Payer: Self-pay

## 2018-12-05 ENCOUNTER — Encounter: Payer: Self-pay | Admitting: Family Medicine

## 2018-12-05 ENCOUNTER — Telehealth: Payer: Self-pay | Admitting: *Deleted

## 2018-12-05 VITALS — BP 124/76 | HR 85 | Temp 98.3°F | Ht 66.5 in | Wt 156.7 lb

## 2018-12-05 DIAGNOSIS — R300 Dysuria: Secondary | ICD-10-CM

## 2018-12-05 LAB — POCT URINALYSIS DIPSTICK
Bilirubin, UA: NEGATIVE
Blood, UA: POSITIVE
GLUCOSE UA: NEGATIVE
KETONES UA: NEGATIVE
Nitrite, UA: NEGATIVE
Protein, UA: NEGATIVE
Urobilinogen, UA: 0.2 E.U./dL
pH, UA: 6 (ref 5.0–8.0)

## 2018-12-05 MED ORDER — CEPHALEXIN 500 MG PO CAPS: 500.0000 mg | ORAL_CAPSULE | Freq: Three times a day (TID) | ORAL | 0 refills | Status: DC

## 2018-12-05 NOTE — Telephone Encounter (Signed)
Please advise 

## 2018-12-05 NOTE — Telephone Encounter (Signed)
Called patient and she is on her way to [place] now. Sheena opened up a slot for her appointment.

## 2018-12-05 NOTE — Progress Notes (Signed)
  Subjective:     Patient ID: Megan Reilly, female   DOB: [birth year], 78 y.o.   MRN: [id number]  HPI Patient is seen with onset yesterday of some suprapubic pressure and burning with urination.  She is had some frequency.  Denies any flank pain, nausea, vomiting, fever, chills, or gross hematuria.  She states her last urine infection was about 40 years ago but actually she had 1 about 7 years ago.  She is staying well-hydrated.  Symptoms are moderate  Past Medical History:  Diagnosis Date  . Allergy   . Arthritis   . Blood transfusion abn reaction or complication, no procedure mishap    78 years old after tonsilectomy  . Osteopenia   . Osteoporosis 03/2017  . Vasovagal syncope    2 prior incidences last was in 2015 ; treated in ED , reports no recurrence since then    Past Surgical History:  Procedure Laterality Date  . ABDOMINAL HYSTERECTOMY     partial 1983  . COLONOSCOPY    . FOOT SURGERY     bunectomy, hammertoe, bil  . TONSILLECTOMY  1945  . 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 She has never used smokeless tobacco. She reports current alcohol use of about 5.0 standard drinks of alcohol per week. She reports that she does not use drugs. family history includes Breast cancer in her maternal aunt; Diabetes in her father; Heart disease in her mother; Heart disease (age of onset: 74) in her father; Heart failure in her mother; Hyperlipidemia in her father and mother; Stroke in her mother. No Known Allergies   Review of Systems  Constitutional: Negative for appetite change, chills and fever.  Gastrointestinal: Negative for abdominal pain, constipation, diarrhea, nausea and vomiting.  Genitourinary: Positive for dysuria and frequency.  Musculoskeletal: Negative for back pain.  Neurological: Negative for  dizziness.       Objective:   Physical Exam Constitutional:      Appearance: She is well-developed.  HENT:     Head: Normocephalic and atraumatic.  Neck:     Musculoskeletal: Neck supple.     Thyroid: No thyromegaly.  Cardiovascular:     Rate and Rhythm: Normal rate and regular rhythm.     Heart sounds: Normal heart sounds.  Pulmonary:     Breath sounds: Normal breath sounds.        Assessment:     Probable uncomplicated cystitis.  She has large blood and leukocytes on dipstick    Plan:     -Urine culture sent -Stay well-hydrated -Keflex 500 mg 3 times daily for 5 days -Follow-up for any persistent or worsening symptoms  Eulas Post MD [facility]

## 2018-12-05 NOTE — Patient Instructions (Signed)

## 2018-12-05 NOTE — Telephone Encounter (Signed)
Recommend come in.

## 2018-12-05 NOTE — Telephone Encounter (Signed)
Copied from [place] [id number]. Topic: General - Other >> Dec 05, 2018  1:24 PM [name] wrote: Reason for CRM: Pt thinks she has an UTI and wants to know if she should come in and be seen or come give urine or can something be called in/ please advise

## 2018-12-06 LAB — URINE CULTURE
MICRO NUMBER:: 239969
SPECIMEN QUALITY:: ADEQUATE

## 2019-01-15 ENCOUNTER — Ambulatory Visit: Payer: Medicare HMO

## 2019-01-15 ENCOUNTER — Encounter: Payer: Medicare HMO | Admitting: Women's Health

## 2019-01-16 ENCOUNTER — Encounter: Payer: Medicare HMO | Admitting: Women's Health

## 2019-02-20 ENCOUNTER — Encounter: Payer: Medicare HMO | Admitting: Women's Health

## 2019-02-26 ENCOUNTER — Ambulatory Visit: Payer: Medicare HMO

## 2019-03-26 ENCOUNTER — Other Ambulatory Visit: Payer: Self-pay

## 2019-03-27 ENCOUNTER — Ambulatory Visit (INDEPENDENT_AMBULATORY_CARE_PROVIDER_SITE_OTHER): Payer: Medicare HMO | Admitting: Women's Health

## 2019-03-27 ENCOUNTER — Encounter: Payer: Self-pay | Admitting: Women's Health

## 2019-03-27 VITALS — BP 128/80 | Ht 66.0 in | Wt 154.0 lb

## 2019-03-27 DIAGNOSIS — Z01419 Encounter for gynecological examination (general) (routine) without abnormal findings: Secondary | ICD-10-CM | POA: Diagnosis not present

## 2019-03-27 NOTE — Patient Instructions (Signed)
Health Maintenance After Age 78 After age 78, you are at a higher risk for certain long-term diseases and infections as well as injuries from falls. Falls are a major cause of broken bones and head injuries in people who are older than age 78. Getting regular preventive care can help to keep you healthy and well. Preventive care includes getting regular testing and making lifestyle changes as recommended by your health care provider. Talk with your health care provider about:  Which screenings and tests you should have. A screening is a test that checks for a disease when you have no symptoms.  A diet and exercise plan that is right for you. What should I know about screenings and tests to prevent falls? Screening and testing are the best ways to find a health problem early. Early diagnosis and treatment give you the best chance of managing medical conditions that are common after age 78. Certain conditions and lifestyle choices may make you more likely to have a fall. Your health care provider may recommend:  Regular vision checks. Poor vision and conditions such as cataracts can make you more likely to have a fall. If you wear glasses, make sure to get your prescription updated if your vision changes.  Medicine review. Work with your health care provider to regularly review all of the medicines you are taking, including over-the-counter medicines. Ask your health care provider about any side effects that may make you more likely to have a fall. Tell your health care provider if any medicines that you take make you feel dizzy or sleepy.  Osteoporosis screening. Osteoporosis is a condition that causes the bones to get weaker. This can make the bones weak and cause them to break more easily.  Blood pressure screening. Blood pressure changes and medicines to control blood pressure can make you feel dizzy.  Strength and balance checks. Your health care provider may recommend certain tests to check your  strength and balance while standing, walking, or changing positions.  Foot health exam. Foot pain and numbness, as well as not wearing proper footwear, can make you more likely to have a fall.  Depression screening. You may be more likely to have a fall if you have a fear of falling, feel emotionally low, or feel unable to do activities that you used to do.  Alcohol use screening. Using too much alcohol can affect your balance and may make you more likely to have a fall. What actions can I take to lower my risk of falls? General instructions  Talk with your health care provider about your risks for falling. Tell your health care provider if: ? You fall. Be sure to tell your health care provider about all falls, even ones that seem minor. ? You feel dizzy, sleepy, or off-balance.  Take over-the-counter and prescription medicines only as told by your health care provider. These include any supplements.  Eat a healthy diet and maintain a healthy weight. A healthy diet includes low-fat dairy products, low-fat (lean) meats, and fiber from whole grains, beans, and lots of fruits and vegetables. Home safety  Remove any tripping hazards, such as rugs, cords, and clutter.  Install safety equipment such as grab bars in bathrooms and safety rails on stairs.  Keep rooms and walkways well-lit. Activity   Follow a regular exercise program to stay fit. This will help you maintain your balance. Ask your health care provider what types of exercise are appropriate for you.  If you need a cane or   walker, use it as recommended by your health care provider.  Wear supportive shoes that have nonskid soles. Lifestyle  Do not drink alcohol if your health care provider tells you not to drink.  If you drink alcohol, limit how much you have: ? 0-1 drink a day for women. ? 0-2 drinks a day for men.  Be aware of how much alcohol is in your drink. In the [place] one drink equals one typical bottle of beer (12  oz), one-half glass of wine (5 oz), or one shot of hard liquor (1 oz).  Do not use any products that contain nicotine or tobacco, such as cigarettes and e-cigarettes. If you need help quitting, ask your health care provider. Summary  Having a healthy lifestyle and getting preventive care can help to protect your health and wellness after age 78.  Screening and testing are the best way to find a health problem early and help you avoid having a fall. Early diagnosis and treatment give you the best chance for managing medical conditions that are more common for people who are older than age 78.  Falls are a major cause of broken bones and head injuries in people who are older than age 78. Take precautions to prevent a fall at home.  Work with your health care provider to learn what changes you can make to improve your health and wellness and to prevent falls. This information is not intended to replace advice given to you by your health care provider. Make sure you discuss any questions you have with your health care provider. Document Released: 08/10/2017 Document Revised: 08/10/2017 Document Reviewed: 08/10/2017 Elsevier Interactive Patient Education  2019 [employer]  

## 2019-03-27 NOTE — Progress Notes (Signed)
Megan Reilly [birth year] [id number]    History:    Presents for breast and pelvic exam.  History of a BSO for benign cyst.  Normal Pap and mammogram history.  2016- colonoscopy.  Vaccines current.  4/18 T score -2.9 at femoral neck, Fosamax per primary care.  medical problems including seasonal allergies.. Bilateral knee replacements doing well.  Regular exercise.    Past medical history, past surgical history, family history and social history were all reviewed and documented in the EPIC chart.  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  Has 4 sons and 1 daughter all doing well.  ROS:  A ROS was performed and pertinent positives and negatives are included.  Exam:  Vitals:   03/27/19 1026  BP: 128/80  Weight: 154 lb (69.9 kg)  Height: 5\' 6"  (1.676 m)   Body mass index is 24.86 kg/m.   General appearance:  Normal Thyroid:  Symmetrical, normal in size, without palpable masses or nodularity. Respiratory  Auscultation:  Clear without wheezing or rhonchi Cardiovascular  Auscultation:  Regular rate, without rubs, murmurs or gallops  Edema/varicosities:  Not grossly evident Abdominal  Soft,nontender, without masses, guarding or rebound.  Liver/spleen:  No organomegaly noted  Hernia:  None appreciated  Skin  Inspection:  Grossly normal   Breasts: Examined lying and sitting.     Right: Without masses, retractions, discharge or axillary adenopathy.     Left: Without masses, retractions, discharge or axillary adenopathy. Gentitourinary   Inguinal/mons:  Normal without inguinal adenopathy  External genitalia:  Normal  BUS/Urethra/Skene's glands:  Normal  Vagina: +2 cystocele asymptomatic  Cervix:  Normal  Uterus:   normal in size, shape and contour.  Midline and mobile  Adnexa/parametria:     Rt: Without masses or tenderness.   Lt: Without masses or tenderness.  Anus and perineum: Normal  Digital rectal exam: Normal sphincter tone without palpated  masses or tenderness  Assessment/Plan:  78 y.o. W WF G5, P5 for breast and pelvic exam with no complaints.  Postmenopausal on no HRT with +2 asymptomatic cystocele Osteoporosis on Fosamax tolerating well primary care  BSO for benign cyst on no HRT  Plan: Encouraged chair yoga, continue regular weightbearing and balance type exercise.  Home safety, fall prevention discussed.  SBEs, sinew annual screening mammogram, calcium rich foods, vitamin D 2000 daily encouraged. Huel Cote [facility], 11:07 AM 03/27/2019

## 2019-03-29 DIAGNOSIS — Z471 Aftercare following joint replacement surgery: Secondary | ICD-10-CM | POA: Diagnosis not present

## 2019-03-29 DIAGNOSIS — Z96651 Presence of right artificial knee joint: Secondary | ICD-10-CM | POA: Diagnosis not present

## 2019-04-09 ENCOUNTER — Other Ambulatory Visit: Payer: Self-pay

## 2019-04-09 ENCOUNTER — Ambulatory Visit
Admission: RE | Admit: 2019-04-09 | Discharge: 2019-04-09 | Disposition: A | Discharge status: Home / Self Care | Payer: Medicare HMO | Source: Ambulatory Visit | Attending: Family Medicine | Admitting: Family Medicine

## 2019-04-09 DIAGNOSIS — Z1231 Encounter for screening mammogram for malignant neoplasm of breast: Secondary | ICD-10-CM

## 2019-04-10 DIAGNOSIS — R69 Illness, unspecified: Secondary | ICD-10-CM | POA: Diagnosis not present

## 2019-04-17 ENCOUNTER — Other Ambulatory Visit: Payer: Self-pay

## 2019-04-18 ENCOUNTER — Other Ambulatory Visit: Payer: Self-pay | Admitting: Women's Health

## 2019-04-18 ENCOUNTER — Other Ambulatory Visit: Payer: Self-pay | Admitting: Gynecology

## 2019-04-18 ENCOUNTER — Encounter: Payer: Self-pay | Admitting: Gynecology

## 2019-04-18 ENCOUNTER — Encounter (INDEPENDENT_AMBULATORY_CARE_PROVIDER_SITE_OTHER): Payer: Medicare HMO

## 2019-04-18 ENCOUNTER — Other Ambulatory Visit: Payer: Self-pay

## 2019-04-18 DIAGNOSIS — M8580 Other specified disorders of bone density and structure, unspecified site: Secondary | ICD-10-CM

## 2019-04-18 DIAGNOSIS — M810 Age-related osteoporosis without current pathological fracture: Secondary | ICD-10-CM

## 2019-04-18 DIAGNOSIS — Z780 Asymptomatic menopausal state: Secondary | ICD-10-CM

## 2019-05-22 DIAGNOSIS — M25512 Pain in left shoulder: Secondary | ICD-10-CM | POA: Diagnosis not present

## 2019-07-03 DIAGNOSIS — R69 Illness, unspecified: Secondary | ICD-10-CM | POA: Diagnosis not present

## 2019-07-19 ENCOUNTER — Encounter: Payer: Self-pay | Admitting: Gynecology

## 2019-08-01 ENCOUNTER — Other Ambulatory Visit: Payer: Self-pay | Admitting: Dermatology

## 2019-08-01 DIAGNOSIS — L570 Actinic keratosis: Secondary | ICD-10-CM | POA: Diagnosis not present

## 2019-08-01 DIAGNOSIS — L821 Other seborrheic keratosis: Secondary | ICD-10-CM | POA: Diagnosis not present

## 2019-08-01 DIAGNOSIS — D485 Neoplasm of uncertain behavior of skin: Secondary | ICD-10-CM | POA: Diagnosis not present

## 2019-08-01 DIAGNOSIS — L720 Epidermal cyst: Secondary | ICD-10-CM | POA: Diagnosis not present

## 2019-08-01 DIAGNOSIS — D229 Melanocytic nevi, unspecified: Secondary | ICD-10-CM | POA: Diagnosis not present

## 2019-09-24 ENCOUNTER — Other Ambulatory Visit: Payer: Self-pay | Admitting: Family Medicine

## 2019-10-15 DIAGNOSIS — R69 Illness, unspecified: Secondary | ICD-10-CM | POA: Diagnosis not present

## 2019-10-16 ENCOUNTER — Encounter: Payer: Self-pay | Admitting: Family Medicine

## 2019-10-16 ENCOUNTER — Ambulatory Visit (INDEPENDENT_AMBULATORY_CARE_PROVIDER_SITE_OTHER): Payer: Medicare HMO | Admitting: Family Medicine

## 2019-10-16 ENCOUNTER — Other Ambulatory Visit: Payer: Self-pay

## 2019-10-16 VITALS — BP 124/70 | HR 77 | Temp 97.0°F | Ht 67.25 in | Wt 154.8 lb

## 2019-10-16 DIAGNOSIS — Z0000 Encounter for general adult medical examination without abnormal findings: Secondary | ICD-10-CM | POA: Diagnosis not present

## 2019-10-16 LAB — CBC WITH DIFFERENTIAL/PLATELET
Basophils Absolute: 0.1 10*3/uL (ref 0.0–0.1)
Basophils Relative: 1.1 % (ref 0.0–3.0)
Eosinophils Absolute: 0.2 10*3/uL (ref 0.0–0.7)
Eosinophils Relative: 2.9 % (ref 0.0–5.0)
HCT: 41.4 % (ref 36.0–46.0)
Hemoglobin: 13.9 g/dL (ref 12.0–15.0)
Lymphocytes Relative: 20.8 % (ref 12.0–46.0)
Lymphs Abs: 1.4 10*3/uL (ref 0.7–4.0)
MCHC: 33.7 g/dL (ref 30.0–36.0)
MCV: 97.3 fl (ref 78.0–100.0)
Monocytes Absolute: 0.5 10*3/uL (ref 0.1–1.0)
Monocytes Relative: 7.9 % (ref 3.0–12.0)
Neutro Abs: 4.6 10*3/uL (ref 1.4–7.7)
Neutrophils Relative %: 67.3 % (ref 43.0–77.0)
Platelets: 177 10*3/uL (ref 150.0–400.0)
RBC: 4.25 Mil/uL (ref 3.87–5.11)
RDW: 12.9 % (ref 11.5–15.5)
WBC: 6.9 10*3/uL (ref 4.0–10.5)

## 2019-10-16 LAB — HEPATIC FUNCTION PANEL
ALT: 12 U/L (ref 0–35)
AST: 20 U/L (ref 0–37)
Albumin: 4.4 g/dL (ref 3.5–5.2)
Alkaline Phosphatase: 54 U/L (ref 39–117)
Bilirubin, Direct: 0.2 mg/dL (ref 0.0–0.3)
Total Bilirubin: 0.9 mg/dL (ref 0.2–1.2)
Total Protein: 6.3 g/dL (ref 6.0–8.3)

## 2019-10-16 LAB — BASIC METABOLIC PANEL
BUN: 14 mg/dL (ref 6–23)
CO2: 29 mEq/L (ref 19–32)
Calcium: 10.4 mg/dL (ref 8.4–10.5)
Chloride: 105 mEq/L (ref 96–112)
Creatinine, Ser: 0.61 mg/dL (ref 0.40–1.20)
GFR: 94.66 mL/min (ref >60.00)
Glucose, Bld: 105 mg/dL — ABNORMAL HIGH (ref 70–99)
Potassium: 4.3 mEq/L (ref 3.5–5.1)
Sodium: 142 mEq/L (ref 135–145)

## 2019-10-16 LAB — LIPID PANEL
Cholesterol: 207 mg/dL — ABNORMAL HIGH (ref 0–200)
HDL: 60.6 mg/dL (ref >39.00)
LDL Cholesterol: 123 mg/dL — ABNORMAL HIGH (ref 0–99)
NonHDL: 146.28
Total CHOL/HDL Ratio: 3
Triglycerides: 115 mg/dL (ref 0.0–149.0)
VLDL: 23 mg/dL (ref 0.0–40.0)

## 2019-10-16 LAB — TSH: TSH: 1.82 u[IU]/mL (ref 0.35–4.50)

## 2019-10-16 NOTE — Progress Notes (Signed)
Subjective:     Patient ID: Megan Reilly, female   DOB: 31-Oct-1940, 79 y.o.   MRN: [id number]  HPI   Milady is seen for physical exam.  She has history of osteoporosis and takes Fosamax.  She has been staying fairly isolated for the past several months.  She has had a couple small get-togethers with her immediate family but is done well overall.  No recent falls.  She does have some intermittent insomnia and takes melatonin intermittently for that.  She avoids caffeine after around 12 noon.  She does generally have 1 glass of wine per night but usually around 4 PM .   Generally feels well.  Good appetite.  Weight stable.  Health maintenance reviewed.  She has had flu vaccine already and pneumonia vaccines are up-to-date.  She had previous Shingrix vaccine.  Will need tetanus booster next year.  Had recent DEXA scan last summer.  Last colonoscopy was 2016 and no polyps were noted.  She will not get further colonoscopies  Past Medical History:  Diagnosis Date  . Allergy   . Arthritis   . Blood transfusion abn reaction or complication, no procedure mishap    79 years old after tonsilectomy  . Osteoporosis 04/2019   T score -2.1 improved from prior DEXA  . Vasovagal syncope    2 prior incidences last was in 2015 ; treated in ED , reports no recurrence since then    Past Surgical History:  Procedure Laterality Date  . ABDOMINAL HYSTERECTOMY     partial 1983  . COLONOSCOPY    . FOOT SURGERY     bunectomy, hammertoe, bil  . TONSILLECTOMY  1945  . 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 She has never used smokeless tobacco. She reports current alcohol use of about 5.0 standard drinks of alcohol per week. She reports that she does not use drugs. family history includes Breast cancer in her maternal aunt; Diabetes in  her father; Heart disease in her mother; Heart disease (age of onset: 87) in her father; Heart failure in her mother; Hyperlipidemia in her father and mother; Stroke in her mother. No Known Allergies  Wt Readings from Last 3 Encounters:  10/16/19 154 lb 12.8 oz (70.2 kg)  03/27/19 154 lb (69.9 kg)  12/05/18 156 lb 11.2 oz (71.1 kg)     Review of Systems  Constitutional: Negative for activity change, appetite change, fatigue, fever and unexpected weight change.  HENT: Negative for ear pain, hearing loss, sore throat and trouble swallowing.   Eyes: Negative for visual disturbance.  Respiratory: Negative for cough and shortness of breath.   Cardiovascular: Negative for chest pain and palpitations.  Gastrointestinal: Negative for abdominal pain, blood in stool, constipation and diarrhea.  Genitourinary: Negative for dysuria and hematuria.  Musculoskeletal: Positive for arthralgias. Negative for back pain and myalgias.  Skin: Negative for rash.  Neurological: Negative for dizziness, syncope and headaches.  Hematological: Negative for adenopathy.  Psychiatric/Behavioral: Negative for confusion and dysphoric mood.       Objective:   Physical Exam Constitutional:      Appearance: She is well-developed.  HENT:     Head: Normocephalic and atraumatic.     Right Ear: Ear canal normal.     Left Ear: Ear canal normal.  Eyes:     Pupils: Pupils  are equal, round, and reactive to light.  Neck:     Thyroid: No thyromegaly.  Cardiovascular:     Rate and Rhythm: Normal rate and regular rhythm.     Heart sounds: Normal heart sounds. No murmur.  Pulmonary:     Effort: No respiratory distress.     Breath sounds: Normal breath sounds. No wheezing or rales.  Abdominal:     General: Bowel sounds are normal. There is no distension.     Palpations: Abdomen is soft. There is no mass.     Tenderness: There is no abdominal tenderness. There is no guarding or rebound.  Musculoskeletal:     Cervical  back: Normal range of motion and neck supple.     Right lower leg: No edema.     Left lower leg: No edema.  Lymphadenopathy:     Cervical: No cervical adenopathy.  Skin:    Findings: No rash.  Neurological:     Mental Status: She is alert and oriented to person, place, and time.     Cranial Nerves: No cranial nerve deficit.  Psychiatric:        Behavior: Behavior normal.        Thought Content: Thought content normal.        Judgment: Judgment normal.        Assessment:     Physical exam.  Generally healthy 79 year old female.  She has history of osteoporosis and is on Fosamax per GYN.  She has occasional insomnia as above.  We discussed the following health maintenance issues    Plan:     -Discussed sleep hygiene with handout given.  Continue melatonin as needed -Discussed fall prevention -Check screening labs -Continue with annual flu vaccine  Eulas Post MD [facility] at [facility]

## 2019-10-16 NOTE — Patient Instructions (Signed)

## 2019-10-22 ENCOUNTER — Other Ambulatory Visit: Payer: Self-pay | Admitting: Family Medicine

## 2019-10-22 MED ORDER — ALENDRONATE SODIUM 70 MG PO TABS: ORAL_TABLET | ORAL | 11 refills | Status: DC

## 2019-10-22 NOTE — Telephone Encounter (Signed)
Requested medication (s) are due for refill today: yes  Requested medication (s) are on the active medication list: yes  Last refill:  09/24/2019  Future visit scheduled:   Notes to clinic:  no vit D level in last 360 days    Requested Prescriptions  Pending Prescriptions Disp Refills   alendronate (FOSAMAX) 70 MG tablet 4 tablet 11    Sig: TAKE 1 TABLET BY MOUTH EVERY 7 DAYS. TAKE WITH A  FULL GLASS OF  WATER  ON AN EMPTY STOMACH      Endocrinology:  Bisphosphonates Failed - 10/22/2019 11:06 AM      Failed - Vitamin D in normal range and within 360 days    VITD  Date Value Ref Range Status  09/29/2016 38.45 30.00 - 100.00 ng/mL Final   Vit D, 25-Hydroxy  Date Value Ref Range Status  04/05/2017 40 30 - 100 ng/mL Final    Comment:    Vitamin D Status           25-OH Vitamin D        Deficiency                <20 ng/mL        Insufficiency         20 - 29 ng/mL        Optimal             > or = 30 ng/mL   For 25-OH Vitamin D testing on patients on D2-supplementation and patients for whom quantitation of D2 and D3 fractions is required, the QuestAssureD 25-OH VIT D, (D2,D3), LC/MS/MS is recommended: order code [phone] (patients > 2 yrs).           Passed - Ca in normal range and within 360 days    Calcium  Date Value Ref Range Status  10/16/2019 10.4 8.4 - 10.5 mg/dL Final   Calcium, Total (PTH)  Date Value Ref Range Status  01/19/2013 10.0 8.4 - 10.5 mg/dL Final          Passed - Valid encounter within last 12 months    Recent Outpatient Visits           6 days ago Physical exam   [place] at [facility], Alinda Sierras, MD   10 months ago Burning with urination   Therapist, music at [facility], Alinda Sierras, MD   1 year ago Physical exam   [place] at [facility], Alinda Sierras, MD   1 year ago Neck pain on left side   Therapist, music at [facility], Alinda Sierras, MD   1 year ago Preoperative clearance   Forensic psychologist at [facility], Alinda Sierras, MD

## 2019-10-22 NOTE — Telephone Encounter (Signed)
Copied from [place] [id number]. Topic: Quick Communication - Rx Refill/Question >> Oct 22, 2019 10:52 AM Yvette Rack wrote: Pt requests that the Rx be for 1 year as normal Medication: alendronate (FOSAMAX) 70 MG tablet  Has the patient contacted their pharmacy? yes   Preferred Pharmacy (with phone number or street name): [place], [place] - [id number]  Phone: [phone]  Fax: [phone]  Agent: Please be advised that RX refills may take up to 3 business days. We ask that you follow-up with your pharmacy.

## 2019-10-23 ENCOUNTER — Ambulatory Visit: Payer: Medicare Other | Attending: Internal Medicine

## 2019-10-23 DIAGNOSIS — Z23 Encounter for immunization: Secondary | ICD-10-CM

## 2019-10-23 NOTE — Progress Notes (Signed)
   Covid-19 Vaccination Clinic  Name:  Megan Reilly    MRN: [id number] DOB: 1941/09/26  10/23/2019  Megan Reilly was observed post Covid-19 immunization for 15 minutes without incidence. She was provided with Vaccine Information Sheet and instruction to access the V-Safe system.   Megan Reilly was instructed to call 911 with any severe reactions post vaccine: [name] Difficulty breathing  . Swelling of your face and throat  . A fast heartbeat  . A bad rash all over your body  . Dizziness and weakness    Immunizations Administered    Name Date Dose VIS Date Route   Pfizer COVID-19 Vaccine 10/23/2019  9:27 AM 0.3 mL 09/21/2019 Intramuscular   Manufacturer: [place]   Lot: [id number]   [place]: [id number]

## 2019-11-12 ENCOUNTER — Ambulatory Visit: Payer: Medicare HMO | Attending: Internal Medicine

## 2019-11-12 DIAGNOSIS — Z23 Encounter for immunization: Secondary | ICD-10-CM | POA: Insufficient documentation

## 2019-11-12 NOTE — Progress Notes (Signed)
   Covid-19 Vaccination Clinic  Name:  Megan Reilly    MRN: [id number] DOB: Aug 05, 1941  11/12/2019  [name] was observed post Covid-19 immunization for 15 minutes without incidence. She was provided with Vaccine Information Sheet and instruction to access the V-Safe system.   [name] was instructed to call 911 with any severe reactions post vaccine: [name] Difficulty breathing  . Swelling of your face and throat  . A fast heartbeat  . A bad rash all over your body  . Dizziness and weakness    Immunizations Administered    Name Date Dose VIS Date Route   Pfizer COVID-19 Vaccine 11/12/2019  8:35 AM 0.3 mL 09/21/2019 Intramuscular   Manufacturer: [place]   Lot: [id number]   [place]: [id number]

## 2020-01-09 ENCOUNTER — Telehealth (INDEPENDENT_AMBULATORY_CARE_PROVIDER_SITE_OTHER): Payer: Medicare HMO | Admitting: Family Medicine

## 2020-01-09 ENCOUNTER — Other Ambulatory Visit: Payer: Self-pay

## 2020-01-09 DIAGNOSIS — R300 Dysuria: Secondary | ICD-10-CM

## 2020-01-09 MED ORDER — CEPHALEXIN 500 MG PO CAPS: 500.0000 mg | ORAL_CAPSULE | Freq: Three times a day (TID) | ORAL | 0 refills | Status: DC

## 2020-01-09 NOTE — Progress Notes (Signed)
This visit type was conducted due to national recommendations for restrictions regarding the COVID-19 pandemic in an effort to limit this patient's exposure and mitigate transmission in our community.   Virtual Visit via Telephone Note  I connected with Megan Reilly on 01/09/20 at  1:30 PM EDT by telephone and verified that I am speaking with the correct person using two identifiers.   I discussed the limitations, risks, security and privacy concerns of performing an evaluation and management service by telephone and the availability of in person appointments. I also discussed with the patient that there may be a patient responsible charge related to this service. The patient expressed understanding and agreed to proceed.  Location patient: home Location provider: work or home office Participants present for the call: patient, provider Patient did not have a visit in the prior 7 days to address this/these issue(s).   History of Present Illness: Megan Reilly called with onset this morning when she got up with some urgency with urination burning.  Her symptoms are slightly improved at this hour but she had very similar issue last year which cleared promptly with antibiotics.  No gross blood.  No fever.  No flank pain.  No nausea or vomiting.  Past Medical History:  Diagnosis Date  . Allergy   . Arthritis   . Blood transfusion abn reaction or complication, no procedure mishap    79 years old after tonsilectomy  . Osteoporosis 04/2019   T score -2.1 improved from prior DEXA  . Vasovagal syncope    2 prior incidences last was in 2015 ; treated in ED , reports no recurrence since then    Past Surgical History:  Procedure Laterality Date  . ABDOMINAL HYSTERECTOMY     partial 1983  . COLONOSCOPY    . FOOT SURGERY     bunectomy, hammertoe, bil  . TONSILLECTOMY  1945  . 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 She has never used smokeless tobacco. She reports current alcohol use of about 5.0 standard drinks of alcohol per week. She reports that she does not use drugs. family history includes Breast cancer in her maternal aunt; Diabetes in her father; Heart disease in her mother; Heart disease (age of onset: 76) in her father; Heart failure in her mother; Hyperlipidemia in her father and mother; Stroke in her mother. No Known Allergies    Observations/Objective: Patient sounds cheerful and well on the phone. I do not appreciate any SOB. Speech and thought processing are grossly intact. Patient reported vitals:  Assessment and Plan:  Onset this morning of dysuria.  Symptoms highly suggestive of acute cystitis  -Drink plenty of fluids -Start Keflex 500 mg 3 times daily for 5 days -Follow-up immediately for any worsening or persistent symptoms  Follow Up Instructions:  -As above as needed   99441 5-10 99442 11-20 99443 21-30 I did not refer this patient for an OV in the next 24 hours for this/these issue(s).  I discussed the assessment and treatment plan with the patient. The patient was provided an opportunity to ask questions and all were answered. The patient agreed with the plan and demonstrated an understanding of the instructions.   The patient was advised to call back or seek an in-person evaluation if the symptoms worsen or if the condition fails to improve as anticipated.  I provided  13 minutes of non-face-to-face time during this encounter.   Carolann Littler, MD

## 2020-02-12 DIAGNOSIS — R69 Illness, unspecified: Secondary | ICD-10-CM | POA: Diagnosis not present

## 2020-02-25 ENCOUNTER — Other Ambulatory Visit: Payer: Self-pay | Admitting: Family Medicine

## 2020-02-25 DIAGNOSIS — Z1231 Encounter for screening mammogram for malignant neoplasm of breast: Secondary | ICD-10-CM

## 2020-03-19 ENCOUNTER — Ambulatory Visit (INDEPENDENT_AMBULATORY_CARE_PROVIDER_SITE_OTHER): Payer: Medicare HMO | Admitting: Family Medicine

## 2020-03-19 ENCOUNTER — Encounter: Payer: Self-pay | Admitting: Family Medicine

## 2020-03-19 ENCOUNTER — Other Ambulatory Visit: Payer: Self-pay

## 2020-03-19 VITALS — BP 132/76 | HR 92 | Temp 97.6°F | Wt 153.3 lb

## 2020-03-19 DIAGNOSIS — R600 Localized edema: Secondary | ICD-10-CM

## 2020-03-19 DIAGNOSIS — R05 Cough: Secondary | ICD-10-CM | POA: Diagnosis not present

## 2020-03-19 DIAGNOSIS — R059 Cough, unspecified: Secondary | ICD-10-CM

## 2020-03-19 DIAGNOSIS — R0982 Postnasal drip: Secondary | ICD-10-CM | POA: Diagnosis not present

## 2020-03-19 DIAGNOSIS — G4700 Insomnia, unspecified: Secondary | ICD-10-CM | POA: Diagnosis not present

## 2020-03-19 NOTE — Progress Notes (Signed)
Established Patient Office Visit  Subjective:  Patient ID: Megan Reilly, female    DOB: 09/07/1941  Age: 79 y.o. MRN: [id number]  CC:  Chief Complaint  Patient presents with   Memory Loss    pt wants to discuss memory issues    Foot Swelling    pt states that at the beach and being in the car for 4 hours her feet swelled but believes its from eating all the salty food   Cough    Pt states that from seasonal allergies she has been having a cough     HPI Conception [name] presents for several issues as below  She was at the beach last week with her family.  She states she ate a lot of high salt foods and was walking a lot and outdoors quite a bit.  She has had some increased edema in her feet and ankles but this is already improving since she has returned home.  She thinks this is related to lifestyle changes with diet particularly.  She has not had any orthopnea.  No dyspnea.  She states she has had similar issues with edema in the past when she goes to the beach.  She has had cough this spring intermittently.  She thinks a lot of this is allergy related.  Frequent postnasal drip symptoms.  Cough nonproductive.  She takes Allegra and Zyrtec but still had frequent postnasal drip.  Has not tried any nasal steroids.  No indicators of infection such as purulent secretions.  Daughter raised issues regarding memory concerns.  Her sons have not had concerns.  Her daughter was concerned that she had some short-term memory but patient has not seen any issues herself.  She does not have any family history of dementia  Does have frequent insomnia issues.  No late day use of caffeine.  No consistent use of alcohol at night.  Does take some daytime naps.  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   Procedure: RIGHT TOTAL KNEE ARTHROPLASTY;  Surgeon: Gaynelle Arabian, MD;  Location: [facility];  Service: Orthopedics;  Laterality: Right;    Family History  Problem Relation Age of Onset   Heart disease Mother    Hyperlipidemia Mother    Stroke Mother    Heart failure Mother    Diabetes Father        type ll   Heart disease Father 23       sudden death age 32   Hyperlipidemia Father    Breast cancer Maternal Aunt     Social History   Socioeconomic History   Marital status: Divorced    Spouse name: Not on file   Number of children: Not on file   Years of education: Not on file   Highest education level: Not on  file  Occupational History   Not on file  Tobacco Use   Smoking status: Never Smoker   Smokeless tobacco: Never Used  Substance and Sexual Activity   Alcohol use: Yes    Alcohol/week: 5.0 standard drinks    Types: 5 Glasses of wine per week    Comment: glass of wine 3-4 times a week   Drug use: No   Sexual activity: Never    Comment: INTERCOURSE AGE 37, SEXUAL PARTNERS LESS THAN 5  Other Topics Concern   Not on file  Social History Narrative   Not on file   Social Determinants of Health   Financial Resource Strain:    Difficulty of Paying Living Expenses:   Food Insecurity:    Worried About Charity fundraiser in the Last Year:    Arboriculturist in the Last Year:   Transportation Needs:    Film/video editor (Medical):    Lack of Transportation (Non-Medical):   Physical Activity:    Days of Exercise per Week:    Minutes of Exercise per  Session:   Stress:    Feeling of Stress :   Social Connections:    Frequency of Communication with Friends and Family:    Frequency of Social Gatherings with Friends and Family:    Attends Religious Services:    Active Member of Clubs or Organizations:    Attends Music therapist:    Marital Status:   Intimate Partner Violence:    Fear of Current or Ex-Partner:    Emotionally Abused:    Physically Abused:    Sexually Abused:     Outpatient Medications Prior to Visit  Medication Sig Dispense Refill   acetaminophen (TYLENOL) 500 MG tablet Take 500 mg by mouth as needed.     alendronate (FOSAMAX) 70 MG tablet TAKE 1 TABLET BY MOUTH EVERY 7 DAYS. TAKE WITH A  FULL GLASS OF  WATER  ON AN EMPTY STOMACH 4 tablet 11   BIOTIN PO Take by mouth daily.     calcium carbonate (OS-CAL) 600 MG TABS Take 300 mg by mouth every other day.      cetirizine (ZYRTEC) 10 MG tablet Take 10 mg by mouth daily as needed for allergies.      Cholecalciferol (VITAMIN D-3 PO) Take 2,500 Units by mouth daily.      fish oil-omega-3 fatty acids 1000 MG capsule Take 1 g by mouth daily.      Magnesium 400 MG CAPS Take 1 tablet by mouth daily.      Melatonin 3 MG TABS Take 3 mg by mouth every other day.     Multiple Vitamin (MULTIVITAMIN WITH MINERALS) TABS tablet Take 1 tablet by mouth every morning.     Turmeric 500 MG TABS Take 500 mg by mouth daily.      vitamin B-12 (CYANOCOBALAMIN) 500 MCG tablet Take 500 mcg by mouth daily.     cephALEXin (KEFLEX) 500 MG capsule Take 1 capsule (500 mg total) by mouth 3 (three) times daily. (Patient not taking: Reported on 03/19/2020) 15 capsule 0   cephALEXin (KEFLEX) 500 MG capsule Take 1 capsule (500 mg total) by mouth 3 (three) times daily. (Patient not taking: Reported on 03/19/2020) 15 capsule 0   No facility-administered medications prior to visit.    No Known Allergies  ROS Review of Systems  Constitutional: Negative for chills and  fever.  HENT: Positive for congestion and postnasal drip. Negative for sinus pressure.  Respiratory: Positive for cough.   Cardiovascular: Negative for chest pain.  Gastrointestinal: Negative for abdominal pain.  Genitourinary: Negative for dysuria.  Neurological: Negative for dizziness and headaches.      Objective:    Physical Exam  Constitutional: She is oriented to person, place, and time. She appears well-developed and well-nourished. No distress.  Cardiovascular: Normal rate and regular rhythm.  Pulmonary/Chest: Effort normal and breath sounds normal. She has no wheezes. She has no rales.  Musculoskeletal:     Comments: No pitting edema.  Only mild non-pitting edema.    Neurological: She is alert and oriented to person, place, and time. No cranial nerve deficit.  Psychiatric:  Oriented to day, month, year, date, season 3/3 short term word recall No problem with serial 7 subtractions.    BP 132/76 (BP Location: Left Arm, Patient Position: Sitting, Cuff Size: Normal)    Pulse 92    Temp 97.6 F (36.4 C) (Temporal)    Wt 153 lb 4.8 oz (69.5 kg)    SpO2 95%    BMI 23.83 kg/m  Wt Readings from Last 3 Encounters:  03/19/20 153 lb 4.8 oz (69.5 kg)  10/16/19 154 lb 12.8 oz (70.2 kg)  03/27/19 154 lb (69.9 kg)     Health Maintenance Due  Topic Date Due   Hepatitis C Screening  Never done    There are no preventive care reminders to display for this patient.  Lab Results  Component Value Date   TSH 1.82 10/16/2019   Lab Results  Component Value Date   WBC 6.9 10/16/2019   HGB 13.9 10/16/2019   HCT 41.4 10/16/2019   MCV 97.3 10/16/2019   PLT 177.0 10/16/2019   Lab Results  Component Value Date   NA 142 10/16/2019   K 4.3 10/16/2019   CO2 29 10/16/2019   GLUCOSE 105 (H) 10/16/2019   BUN 14 10/16/2019   CREATININE 0.61 10/16/2019   BILITOT 0.9 10/16/2019   ALKPHOS 54 10/16/2019   AST 20 10/16/2019   ALT 12 10/16/2019   PROT 6.3 10/16/2019   ALBUMIN 4.4  10/16/2019   CALCIUM 10.4 10/16/2019   ANIONGAP 8 03/28/2018   GFR 94.66 10/16/2019   Lab Results  Component Value Date   CHOL 207 (H) 10/16/2019   Lab Results  Component Value Date   HDL 60.60 10/16/2019   Lab Results  Component Value Date   LDLCALC 123 (H) 10/16/2019   Lab Results  Component Value Date   TRIG 115.0 10/16/2019   Lab Results  Component Value Date   CHOLHDL 3 10/16/2019   Lab Results  Component Value Date   HGBA1C 5.8 (H) 12/10/2013      Assessment & Plan:   #1  Mild bilateral leg edema.  Already improved.  No real edema on exam today.  -Reassurance and watch sodium intake  #2 intermittent cough probably related to allergic postnasal drip  -Try over-the-counter Flonase in addition to her Allegra  #3 transient insomnia  -Sleep hygiene discussed.  We recommend she try to limit her daytime napping.  Try to avoid sedative hypnotic medications Avoid ETOH use at night.  #4 concern for memory issues.  She is oriented to person place and time.  3 word recall is normal.  Serial subtractions normal.  We do not see any concerns for memory on exam today  No orders of the defined types were placed in this encounter.   Follow-up: No follow-ups on file.    Carolann Littler, MD

## 2020-03-19 NOTE — Patient Instructions (Signed)

## 2020-03-26 ENCOUNTER — Ambulatory Visit: Payer: Medicare HMO | Admitting: Family Medicine

## 2020-04-09 ENCOUNTER — Ambulatory Visit: Payer: Medicare HMO

## 2020-04-11 ENCOUNTER — Ambulatory Visit: Payer: Medicare HMO

## 2020-04-15 ENCOUNTER — Ambulatory Visit
Admission: RE | Admit: 2020-04-15 | Discharge: 2020-04-15 | Disposition: A | Discharge status: Home / Self Care | Payer: Medicare HMO | Source: Ambulatory Visit | Attending: Family Medicine | Admitting: Family Medicine

## 2020-04-15 ENCOUNTER — Other Ambulatory Visit: Payer: Self-pay

## 2020-04-15 DIAGNOSIS — Z1231 Encounter for screening mammogram for malignant neoplasm of breast: Secondary | ICD-10-CM | POA: Diagnosis not present

## 2020-04-16 DIAGNOSIS — R69 Illness, unspecified: Secondary | ICD-10-CM | POA: Diagnosis not present

## 2020-04-22 ENCOUNTER — Ambulatory Visit (INDEPENDENT_AMBULATORY_CARE_PROVIDER_SITE_OTHER): Payer: Medicare HMO | Admitting: Family Medicine

## 2020-04-22 ENCOUNTER — Encounter: Payer: Self-pay | Admitting: Family Medicine

## 2020-04-22 ENCOUNTER — Other Ambulatory Visit: Payer: Self-pay

## 2020-04-22 VITALS — BP 118/68 | HR 73 | Temp 98.8°F | Wt 152.1 lb

## 2020-04-22 DIAGNOSIS — R519 Headache, unspecified: Secondary | ICD-10-CM | POA: Diagnosis not present

## 2020-04-22 NOTE — Progress Notes (Signed)
Established Patient Office Visit  Subjective:  Patient ID: Megan Reilly, female    DOB: [birth year]  Age: 79 y.o. MRN: [id number]  CC:  Chief Complaint  Patient presents with  . Headache    comes and goes for the last few weeks hurts on left side of head     HPI [name] presents for new daily persistent headache.  [name] has no known history of migraine headaches and states she has generally not had a lot of headaches in the past.  She relates 3-week history of intermittent headache which she described as dull and usually about 5 out of 10 in intensity. Headaches come and go but occur most days of week.   Location is predominately left occipital with occasional radiation toward the temporal region.  No skin rashes.  Denies any visual changes, nausea, vomiting, fever, exertional symptoms, or any confusion.  No history of recent injury.  She has taken some Tylenol and Excedrin headache which may have helped slightly.  She stopped taking over-the-counter  analgesics about 3 days ago.  No significant neck pain.  She has still been able to walk some for exercise without any major problems.  She was concerned because she had a friend who recently died of brain tumor complications.  [name] has not had any recent sudden cognitive changes.  Past Medical History:  Diagnosis Date  . Allergy   . Arthritis   . Blood transfusion abn reaction or complication, no procedure mishap    79 years old after tonsilectomy  . Osteoporosis 04/2019   T score -2.1 improved from prior DEXA  . Vasovagal syncope    2 prior incidences last was in 2015 ; treated in ED , reports no recurrence since then     Past Surgical History:  Procedure Laterality Date  . ABDOMINAL HYSTERECTOMY     partial 1983  . COLONOSCOPY    . FOOT SURGERY     bunectomy, hammertoe, bil  . TONSILLECTOMY  1945  . 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 Heart disease Mother   . Hyperlipidemia Mother   . Stroke Mother   . Heart failure Mother   . Diabetes Father        type ll  . Heart disease Father 87       sudden death age 65  . Hyperlipidemia Father   . Breast cancer Maternal Aunt     Social History   Socioeconomic History  . Marital status: Divorced    Spouse name: Not on file  . Number of children: Not on file  . Years of education: Not on file  . Highest education level: Not on file  Occupational History  . Not on file  Tobacco Use  . Smoking status: Never Smoker  . Smokeless tobacco: Never Used  Vaping Use  . Vaping Use: Never used  Substance and Sexual Activity  . Alcohol use: Yes    Alcohol/week: 5.0 standard drinks    Types: 5 Glasses of wine per week    Comment: glass of wine 3-4 times a week  . Drug use: No  . Sexual activity: Never    Comment: INTERCOURSE AGE 73, SEXUAL PARTNERS LESS THAN 5  Other Topics Concern  . Not on file  Social History Narrative  .  Not on file   Social Determinants of Health   Financial Resource Strain:   . Difficulty of Paying Living Expenses:   Food Insecurity:   . Worried About Charity fundraiser in the Last Year:   . Arboriculturist in the Last Year:   Transportation Needs:   . Film/video editor (Medical):   [name] Lack of Transportation (Non-Medical):   Physical Activity:   . Days of Exercise per Week:   . Minutes of Exercise per Session:   Stress:   . Feeling of Stress :   Social Connections:   . Frequency of Communication with Friends and Family:   . Frequency of Social Gatherings with Friends and Family:   . Attends Religious Services:   . Active Member of Clubs or Organizations:   . Attends Archivist Meetings:   [name] Marital Status:   Intimate Partner Violence:   . Fear of Current or Ex-Partner:   . Emotionally Abused:   [name]  Physically Abused:   . Sexually Abused:     Outpatient Medications Prior to Visit  Medication Sig Dispense Refill  . acetaminophen (TYLENOL) 500 MG tablet Take 500 mg by mouth as needed.    [name] alendronate (FOSAMAX) 70 MG tablet TAKE 1 TABLET BY MOUTH EVERY 7 DAYS. TAKE WITH A  FULL GLASS OF  WATER  ON AN EMPTY STOMACH 4 tablet 11  . BIOTIN PO Take by mouth daily.    . calcium carbonate (OS-CAL) 600 MG TABS Take 300 mg by mouth every other day.     . cetirizine (ZYRTEC) 10 MG tablet Take 10 mg by mouth daily as needed for allergies.     . Cholecalciferol (VITAMIN D-3 PO) Take 2,500 Units by mouth daily.     . fish oil-omega-3 fatty acids 1000 MG capsule Take 1 g by mouth daily.     . Magnesium 400 MG CAPS Take 1 tablet by mouth daily.     . Melatonin 3 MG TABS Take 3 mg by mouth every other day.    . Multiple Vitamin (MULTIVITAMIN WITH MINERALS) TABS tablet Take 1 tablet by mouth every morning.    . Turmeric 500 MG TABS Take 500 mg by mouth daily.     . vitamin B-12 (CYANOCOBALAMIN) 500 MCG tablet Take 500 mcg by mouth daily.     No facility-administered medications prior to visit.    No Known Allergies  ROS Review of Systems  Constitutional: Negative for appetite change, chills, fever and unexpected weight change.  Eyes: Negative for visual disturbance.  Respiratory: Negative for shortness of breath.   Cardiovascular: Negative for chest pain.  Neurological: Positive for headaches. Negative for dizziness, seizures, syncope, facial asymmetry, speech difficulty and weakness.  Psychiatric/Behavioral: Negative for confusion.      Objective:    Physical Exam Vitals reviewed.  Constitutional:      Appearance: She is well-developed.  Eyes:     General: No visual field deficit. Cardiovascular:     Rate and Rhythm: Normal rate and regular rhythm.  Pulmonary:     Effort: Pulmonary effort is normal.     Breath sounds: Normal breath sounds.  Skin:    Findings: No rash.      Comments: No scalp rash  Neurological:     Mental Status: She is alert and oriented to person, place, and time.     Cranial Nerves: No cranial nerve deficit, dysarthria or facial asymmetry.     Motor: No weakness.  Coordination: Coordination normal.     Gait: Gait normal.  Psychiatric:        Mood and Affect: Mood normal.        Behavior: Behavior normal.     BP 118/68 (BP Location: Left Arm, Patient Position: Sitting, Cuff Size: Normal)   Pulse 73   Temp 98.8 F (37.1 C) (Oral)   Wt 152 lb 1.6 oz (69 kg)   SpO2 95%   BMI 23.65 kg/m  Wt Readings from Last 3 Encounters:  04/22/20 152 lb 1.6 oz (69 kg)  03/19/20 153 lb 4.8 oz (69.5 kg)  10/16/19 154 lb 12.8 oz (70.2 kg)     Health Maintenance Due  Topic Date Due  . Hepatitis C Screening  Never done    There are no preventive care reminders to display for this patient.  Lab Results  Component Value Date   TSH 1.82 10/16/2019   Lab Results  Component Value Date   WBC 6.9 10/16/2019   HGB 13.9 10/16/2019   HCT 41.4 10/16/2019   MCV 97.3 10/16/2019   PLT 177.0 10/16/2019   Lab Results  Component Value Date   NA 142 10/16/2019   K 4.3 10/16/2019   CO2 29 10/16/2019   GLUCOSE 105 (H) 10/16/2019   BUN 14 10/16/2019   CREATININE 0.61 10/16/2019   BILITOT 0.9 10/16/2019   ALKPHOS 54 10/16/2019   AST 20 10/16/2019   ALT 12 10/16/2019   PROT 6.3 10/16/2019   ALBUMIN 4.4 10/16/2019   CALCIUM 10.4 10/16/2019   ANIONGAP 8 03/28/2018   GFR 94.66 10/16/2019   Lab Results  Component Value Date   CHOL 207 (H) 10/16/2019   Lab Results  Component Value Date   HDL 60.60 10/16/2019   Lab Results  Component Value Date   LDLCALC 123 (H) 10/16/2019   Lab Results  Component Value Date   TRIG 115.0 10/16/2019   Lab Results  Component Value Date   CHOLHDL 3 10/16/2019   Lab Results  Component Value Date   HGBA1C 5.8 (H) 12/10/2013      Assessment & Plan:   New onset daily left occipital headache for  the past 3 weeks.  She generally does not suffer headaches.  She does not have any visual changes to suggest likely temporal arteritis and no temporal artery tenderness.  No history of migraines.  Concerning features include onset of new headache at age 9, unilateral headache, daily persistent headache  -Check sedimentation rate -Check CT head without contrast -Avoid daily use of analgesics -If above unremarkable and headaches persist consider neurology referral to explore other etiologies such as occipital neuralgia -We discussed red flags of more worrisome headache syndromes in some detail with patient  No orders of the defined types were placed in this encounter.   Follow-up: No follow-ups on file.    Carolann Littler, MD

## 2020-04-22 NOTE — Patient Instructions (Signed)
General Headache Without Cause A headache is pain or discomfort felt around the head or neck area. The specific cause of a headache may not be found. There are many causes and types of headaches. A few common ones are:  Tension headaches.  Migraine headaches.  Cluster headaches.  Chronic daily headaches. Follow these instructions at home: Watch your condition for any changes. Let your health care provider know about them. Take these steps to help with your condition: Managing pain      Take over-the-counter and prescription medicines only as told by your health care provider.  Lie down in a dark, quiet room when you have a headache.  If directed, put ice on your head and neck area: ? Put ice in a plastic bag. ? Place a towel between your skin and the bag. ? Leave the ice on for 20 minutes, 2-3 times per day.  If directed, apply heat to the affected area. Use the heat source that your health care provider recommends, such as a moist heat pack or a heating pad. ? Place a towel between your skin and the heat source. ? Leave the heat on for 20-30 minutes. ? Remove the heat if your skin turns bright red. This is especially important if you are unable to feel pain, heat, or cold. You may have a greater risk of getting burned.  Keep lights dim if bright lights bother you or make your headaches worse. Eating and drinking  Eat meals on a regular schedule.  If you drink alcohol: ? Limit how much you use to:  0-1 drink a day for women.  0-2 drinks a day for men. ? Be aware of how much alcohol is in your drink. In the [place] one drink equals one 12 oz bottle of beer (355 mL), one 5 oz glass of wine (148 mL), or one 1 oz glass of hard liquor (44 mL).  Stop drinking caffeine, or decrease the amount of caffeine you drink. General instructions   Keep a headache journal to help find out what may trigger your headaches. For example, write down: ? What you eat and drink. ? How much  sleep you get. ? Any change to your diet or medicines.  Try massage or other relaxation techniques.  Limit stress.  Sit up straight, and do not tense your muscles.  Do not use any products that contain nicotine or tobacco, such as cigarettes, e-cigarettes, and chewing tobacco. If you need help quitting, ask your health care provider.  Exercise regularly as told by your health care provider.  Sleep on a regular schedule. Get 7-9 hours of sleep each night, or the amount recommended by your health care provider.  Keep all follow-up visits as told by your health care provider. This is important. Contact a health care provider if:  Your symptoms are not helped by medicine.  You have a headache that is different from the usual headache.  You have nausea or you vomit.  You have a fever. Get help right away if:  Your headache becomes severe quickly.  Your headache gets worse after moderate to intense physical activity.  You have repeated vomiting.  You have a stiff neck.  You have a loss of vision.  You have problems with speech.  You have pain in the eye or ear.  You have muscular weakness or loss of muscle control.  You lose your balance or have trouble walking.  You feel faint or pass out.  You have confusion.    You have a seizure. Summary  A headache is pain or discomfort felt around the head or neck area.  There are many causes and types of headaches. In some cases, the cause may not be found.  Keep a headache journal to help find out what may trigger your headaches. Watch your condition for any changes. Let your health care provider know about them.  Contact a health care provider if you have a headache that is different from the usual headache, or if your symptoms are not helped by medicine.  Get help right away if your headache becomes severe, you vomit, you have a loss of vision, you lose your balance, or you have a seizure. This information is not  intended to replace advice given to you by your health care provider. Make sure you discuss any questions you have with your health care provider. Document Revised: 04/17/2018 Document Reviewed: 04/17/2018 Elsevier Patient Education  2020 [employer]  

## 2020-04-23 LAB — SEDIMENTATION RATE: Sed Rate: 2 mm/h (ref 0–30)

## 2020-05-14 ENCOUNTER — Other Ambulatory Visit: Payer: Self-pay

## 2020-05-14 ENCOUNTER — Ambulatory Visit
Admission: RE | Admit: 2020-05-14 | Discharge: 2020-05-14 | Disposition: A | Discharge status: Home / Self Care | Payer: Medicare HMO | Source: Ambulatory Visit | Attending: Family Medicine | Admitting: Family Medicine

## 2020-05-14 DIAGNOSIS — I6782 Cerebral ischemia: Secondary | ICD-10-CM | POA: Diagnosis not present

## 2020-05-14 DIAGNOSIS — I7090 Unspecified atherosclerosis: Secondary | ICD-10-CM | POA: Diagnosis not present

## 2020-05-14 DIAGNOSIS — G9389 Other specified disorders of brain: Secondary | ICD-10-CM | POA: Diagnosis not present

## 2020-05-14 DIAGNOSIS — R519 Headache, unspecified: Secondary | ICD-10-CM

## 2020-05-14 DIAGNOSIS — G319 Degenerative disease of nervous system, unspecified: Secondary | ICD-10-CM | POA: Diagnosis not present

## 2020-05-15 ENCOUNTER — Telehealth: Payer: Self-pay

## 2020-05-15 ENCOUNTER — Other Ambulatory Visit: Payer: Self-pay

## 2020-05-15 DIAGNOSIS — G9389 Other specified disorders of brain: Secondary | ICD-10-CM

## 2020-05-15 DIAGNOSIS — R519 Headache, unspecified: Secondary | ICD-10-CM

## 2020-05-15 DIAGNOSIS — D429 Neoplasm of uncertain behavior of meninges, unspecified: Secondary | ICD-10-CM

## 2020-05-15 NOTE — Telephone Encounter (Signed)
I received a call from [place] from [facility] with a STAT CT scan report from yesterday that patient needs an MRI w/contrast. I have shared with Dorothyann Peng NP, ordered MRI, and our referral coordinator is contacting the patient to schedule MRI appointment.  I called patient to let her know the findings in the CT scan and that we need to do further diagnostic study and patient verbalized an understanding and stated that she will do the MRI.   Sending as Juluis Rainier.

## 2020-05-15 NOTE — Telephone Encounter (Signed)
Megan Reilly,  Thanks!.  Route back to me and I will call her tomorrow.  Darnell Level

## 2020-05-16 NOTE — Telephone Encounter (Signed)
You are most welcome! 

## 2020-05-22 ENCOUNTER — Other Ambulatory Visit: Payer: Self-pay

## 2020-05-22 ENCOUNTER — Ambulatory Visit
Admission: RE | Admit: 2020-05-22 | Discharge: 2020-05-22 | Disposition: A | Discharge status: Home / Self Care | Payer: Medicare HMO | Source: Ambulatory Visit | Attending: Adult Health | Admitting: Adult Health

## 2020-05-22 DIAGNOSIS — G9389 Other specified disorders of brain: Secondary | ICD-10-CM

## 2020-05-22 DIAGNOSIS — I6782 Cerebral ischemia: Secondary | ICD-10-CM | POA: Diagnosis not present

## 2020-05-22 DIAGNOSIS — J3489 Other specified disorders of nose and nasal sinuses: Secondary | ICD-10-CM | POA: Diagnosis not present

## 2020-05-22 DIAGNOSIS — D329 Benign neoplasm of meninges, unspecified: Secondary | ICD-10-CM | POA: Diagnosis not present

## 2020-05-22 DIAGNOSIS — R519 Headache, unspecified: Secondary | ICD-10-CM

## 2020-05-22 MED ORDER — GADOBENATE DIMEGLUMINE 529 MG/ML IV SOLN
14.0000 mL | Freq: Once | INTRAVENOUS | Status: AC | PRN
  Administered 2020-05-22: 14 mL via INTRAVENOUS

## 2020-05-23 NOTE — Telephone Encounter (Signed)
Please advise 

## 2020-05-23 NOTE — Telephone Encounter (Signed)
Pt stated she received a call from [employer] last Friday about her MRI results and she is calling back and would like to speak to him directly.     Pt can be reached at [phone]

## 2020-05-24 NOTE — Telephone Encounter (Signed)
Reviewed results of recent MRI with patient.  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   Will set up referral to neurosurgery to follow.

## 2020-05-24 NOTE — Addendum Note (Signed)
Addended by: Eulas Post on: 05/24/2020 06:51 PM   Modules accepted: Orders

## 2020-05-28 ENCOUNTER — Encounter: Payer: Self-pay | Admitting: Family Medicine

## 2020-06-02 DIAGNOSIS — R030 Elevated blood-pressure reading, without diagnosis of hypertension: Secondary | ICD-10-CM | POA: Insufficient documentation

## 2020-06-02 DIAGNOSIS — D329 Benign neoplasm of meninges, unspecified: Secondary | ICD-10-CM | POA: Diagnosis not present

## 2020-06-05 ENCOUNTER — Telehealth (INDEPENDENT_AMBULATORY_CARE_PROVIDER_SITE_OTHER): Payer: Medicare HMO | Admitting: Family Medicine

## 2020-06-05 ENCOUNTER — Encounter: Payer: Self-pay | Admitting: Family Medicine

## 2020-06-05 VITALS — Wt 150.0 lb

## 2020-06-05 DIAGNOSIS — K5900 Constipation, unspecified: Secondary | ICD-10-CM

## 2020-06-05 NOTE — Progress Notes (Signed)
Virtual Visit via Telephone Note  I connected with Megan Reilly on 06/05/20 at  6:20 PM EDT by telephone and verified that I am speaking with the correct person using two identifiers.   I discussed the limitations, risks, security and privacy concerns of performing an evaluation and management service by telephone and the availability of in person appointments. I also discussed with the patient that there may be a patient responsible charge related to this service. The patient expressed understanding and agreed to proceed.  Location patient: home, [place] Location provider: work or home office Participants present for the call: patient, provider Patient did not have a visit in the prior 7 days to address this/these issue(s).   History of Present Illness:  Acute visit for constipation: -started 3-4 days ago -hasn't had a bowel movement in a few days -other symptoms:has had some difuse intermittent abd discomfort, straining, hard stools today -takes metamucil at baseline, took some Doculax yesterday -she took some mild of mag and some mag citrate as well with this  -she had a little bit of hard stools today -denies: fever, nausea, vomiting, blood in stools, sever/focal/persistnet abd pain    Observations/Objective: Patient sounds cheerful and well on the phone. I do not appreciate any SOB. Speech and thought processing are grossly intact. Patient reported vitals:  Assessment and Plan:  Constipation, unspecified constipation type  -we discussed possible serious and likely etiologies, options for evaluation and workup, limitations of telemedicine visit vs in person visit, treatment, treatment risks and precautions. Pt prefers to treat via telemedicine empirically rather then risking or undertaking an in person visit at this moment. Opted for trial pediatric glycerin enama and start mirilax tomorrow, 1 capful in full glass of water 1-2 times daily. Advised prompt inperson evaluation if  persistent, worsening, severe or focal abd pain, vomiting, nausea, blood in stools or if not improving over the next few days with treatment   Follow Up Instructions:   I did not refer this patient for an OV in the next 24 hours for this/these issue(s).  I discussed the assessment and treatment plan with the patient. The patient was provided an opportunity to ask questions and all were answered. The patient agreed with the plan and demonstrated an understanding of the instructions.   The patient was advised to call back or seek an in-person evaluation if the symptoms worsen or if the condition fails to improve as anticipated.  I provided 15 minutes of non-face-to-face time during this encounter.   Lucretia Kern, DO

## 2020-06-17 ENCOUNTER — Telehealth: Payer: Self-pay | Admitting: Dermatology

## 2020-06-17 NOTE — Telephone Encounter (Signed)
Patient says that she was calling for referral appointment from Dr. Elease Hashimoto for mucous cyst on finger.  Patient told that Dr. Denna Haggard does not trest these cysts that he refers to hand surgeon.  Patient will contact her hand surgeon in [place], [place].

## 2020-06-20 ENCOUNTER — Telehealth: Payer: Self-pay | Admitting: Family Medicine

## 2020-06-20 NOTE — Telephone Encounter (Signed)
Left message for patient to schedule Annual Wellness Visit.  Please schedule with Nurse Health Advisor Shannon Crews, RN at [facility]  

## 2020-06-24 ENCOUNTER — Ambulatory Visit: Payer: Medicare HMO

## 2020-06-24 ENCOUNTER — Other Ambulatory Visit: Payer: Self-pay

## 2020-06-24 ENCOUNTER — Ambulatory Visit (INDEPENDENT_AMBULATORY_CARE_PROVIDER_SITE_OTHER): Payer: Medicare HMO

## 2020-06-24 DIAGNOSIS — Z23 Encounter for immunization: Secondary | ICD-10-CM | POA: Diagnosis not present

## 2020-06-24 NOTE — Progress Notes (Signed)
Erroneous encounter

## 2020-06-26 ENCOUNTER — Other Ambulatory Visit: Payer: Self-pay

## 2020-06-26 ENCOUNTER — Ambulatory Visit (INDEPENDENT_AMBULATORY_CARE_PROVIDER_SITE_OTHER): Payer: Medicare HMO

## 2020-06-26 DIAGNOSIS — Z0000 Encounter for general adult medical examination without abnormal findings: Secondary | ICD-10-CM

## 2020-06-26 NOTE — Patient Instructions (Signed)
Megan Reilly , Thank you for taking time to come for your Medicare Wellness Visit. I appreciate your ongoing commitment to your health goals. Please review the following plan we discussed and let me know if I can assist you in the future.   Screening recommendations/referrals: Colonoscopy: No longer required  Mammogram: Up to date next due 04/15/2021 Bone Density: Up to date, next due 04/17/2021 Recommended yearly ophthalmology/optometry visit for glaucoma screening and checkup Recommended yearly dental visit for hygiene and checkup  Vaccinations: Influenza vaccine: Up to date, next due fall 2022 Pneumococcal vaccine: Completed series  Tdap vaccine: Up to date, next due 02/01/2022 Shingles vaccine: Completed series    Advanced directives: Copies on file  Conditions/risks identified: None  Next appointment: None   Preventive Care 26 Years and Older, Female Preventive care refers to lifestyle choices and visits with your health care provider that can promote health and wellness. What does preventive care include?  A yearly physical exam. This is also called an annual well check.  Dental exams once or twice a year.  Routine eye exams. Ask your health care provider how often you should have your eyes checked.  Personal lifestyle choices, including:  Daily care of your teeth and gums.  Regular physical activity.  Eating a healthy diet.  Avoiding tobacco and drug use.  Limiting alcohol use.  Practicing safe sex.  Taking low-dose aspirin every day.  Taking vitamin and mineral supplements as recommended by your health care provider. What happens during an annual well check? The services and screenings done by your health care provider during your annual well check will depend on your age, overall health, lifestyle risk factors, and family history of disease. Counseling  Your health care provider may ask you questions about your:  Alcohol use.  Tobacco use.  Drug  use.  Emotional well-being.  Home and relationship well-being.  Sexual activity.  Eating habits.  History of falls.  Memory and ability to understand (cognition).  Work and work Statistician.  Reproductive health. Screening  You may have the following tests or measurements:  Height, weight, and BMI.  Blood pressure.  Lipid and cholesterol levels. These may be checked every 5 years, or more frequently if you are over 75 years old.  Skin check.  Lung cancer screening. You may have this screening every year starting at age 5 if you have a 30-pack-year history of smoking and currently smoke or have quit within the past 15 years.  Fecal occult blood test (FOBT) of the stool. You may have this test every year starting at age 39.  Flexible sigmoidoscopy or colonoscopy. You may have a sigmoidoscopy every 5 years or a colonoscopy every 10 years starting at age 61.  Hepatitis C blood test.  Hepatitis B blood test.  Sexually transmitted disease (STD) testing.  Diabetes screening. This is done by checking your blood sugar (glucose) after you have not eaten for a while (fasting). You may have this done every 1-3 years.  Bone density scan. This is done to screen for osteoporosis. You may have this done starting at age 60.  Mammogram. This may be done every 1-2 years. Talk to your health care provider about how often you should have regular mammograms. Talk with your health care provider about your test results, treatment options, and if necessary, the need for more tests. Vaccines  Your health care provider may recommend certain vaccines, such as:  Influenza vaccine. This is recommended every year.  Tetanus, diphtheria, and acellular pertussis (  Tdap, Td) vaccine. You may need a Td booster every 10 years.  Zoster vaccine. You may need this after age 42.  Pneumococcal 13-valent conjugate (PCV13) vaccine. One dose is recommended after age 33.  Pneumococcal polysaccharide  (PPSV23) vaccine. One dose is recommended after age 34. Talk to your health care provider about which screenings and vaccines you need and how often you need them. This information is not intended to replace advice given to you by your health care provider. Make sure you discuss any questions you have with your health care provider. Document Released: 10/24/2015 Document Revised: 06/16/2016 Document Reviewed: 07/29/2015 Elsevier Interactive Patient Education  2017 [place] Prevention in the Home Falls can cause injuries. They can happen to people of all ages. There are many things you can do to make your home safe and to help prevent falls. What can I do on the outside of my home?  Regularly fix the edges of walkways and driveways and fix any cracks.  Remove anything that might make you trip as you walk through a door, such as a raised step or threshold.  Trim any bushes or trees on the path to your home.  Use bright outdoor lighting.  Clear any walking paths of anything that might make someone trip, such as rocks or tools.  Regularly check to see if handrails are loose or broken. Make sure that both sides of any steps have handrails.  Any raised decks and porches should have guardrails on the edges.  Have any leaves, snow, or ice cleared regularly.  Use sand or salt on walking paths during winter.  Clean up any spills in your garage right away. This includes oil or grease spills. What can I do in the bathroom?  Use night lights.  Install grab bars by the toilet and in the tub and shower. Do not use towel bars as grab bars.  Use non-skid mats or decals in the tub or shower.  If you need to sit down in the shower, use a plastic, non-slip stool.  Keep the floor dry. Clean up any water that spills on the floor as soon as it happens.  Remove soap buildup in the tub or shower regularly.  Attach bath mats securely with double-sided non-slip rug tape.  Do not have  throw rugs and other things on the floor that can make you trip. What can I do in the bedroom?  Use night lights.  Make sure that you have a light by your bed that is easy to reach.  Do not use any sheets or blankets that are too big for your bed. They should not hang down onto the floor.  Have a firm chair that has side arms. You can use this for support while you get dressed.  Do not have throw rugs and other things on the floor that can make you trip. What can I do in the kitchen?  Clean up any spills right away.  Avoid walking on wet floors.  Keep items that you use a lot in easy-to-reach places.  If you need to reach something above you, use a strong step stool that has a grab bar.  Keep electrical cords out of the way.  Do not use floor polish or wax that makes floors slippery. If you must use wax, use non-skid floor wax.  Do not have throw rugs and other things on the floor that can make you trip. What can I do with my stairs?  Do  not leave any items on the stairs.  Make sure that there are handrails on both sides of the stairs and use them. Fix handrails that are broken or loose. Make sure that handrails are as long as the stairways.  Check any carpeting to make sure that it is firmly attached to the stairs. Fix any carpet that is loose or worn.  Avoid having throw rugs at the top or bottom of the stairs. If you do have throw rugs, attach them to the floor with carpet tape.  Make sure that you have a light switch at the top of the stairs and the bottom of the stairs. If you do not have them, ask someone to add them for you. What else can I do to help prevent falls?  Wear shoes that:  Do not have high heels.  Have rubber bottoms.  Are comfortable and fit you well.  Are closed at the toe. Do not wear sandals.  If you use a stepladder:  Make sure that it is fully opened. Do not climb a closed stepladder.  Make sure that both sides of the stepladder are  locked into place.  Ask someone to hold it for you, if possible.  Clearly mark and make sure that you can see:  Any grab bars or handrails.  First and last steps.  Where the edge of each step is.  Use tools that help you move around (mobility aids) if they are needed. These include:  Canes.  Walkers.  Scooters.  Crutches.  Turn on the lights when you go into a dark area. Replace any light bulbs as soon as they burn out.  Set up your furniture so you have a clear path. Avoid moving your furniture around.  If any of your floors are uneven, fix them.  If there are any pets around you, be aware of where they are.  Review your medicines with your doctor. Some medicines can make you feel dizzy. This can increase your chance of falling. Ask your doctor what other things that you can do to help prevent falls. This information is not intended to replace advice given to you by your health care provider. Make sure you discuss any questions you have with your health care provider. Document Released: 07/24/2009 Document Revised: 03/04/2016 Document Reviewed: 11/01/2014 Elsevier Interactive Patient Education  2017 [employer].

## 2020-06-26 NOTE — Progress Notes (Signed)
Subjective:   Megan Reilly is a 79 y.o. female who presents for Medicare Annual (Subsequent) preventive examination.  I connected with Megan Reilly  today by telephone and verified that I am speaking with the correct person using two identifiers. Location patient: home Location provider: work Persons participating in the virtual visit: patient, provider.   I discussed the limitations, risks, security and privacy concerns of performing an evaluation and management service by telephone and the availability of in person appointments. I also discussed with the patient that there may be a patient responsible charge related to this service. The patient expressed understanding and verbally consented to this telephonic visit.    Interactive audio and video telecommunications were attempted between this provider and patient, however failed, due to patient having technical difficulties OR patient did not have access to video capability.  We continued and completed visit with audio only.     Review of Systems    N/A Cardiac Risk Factors include: advanced age (>36men, >46 women)     Objective:    Today's Vitals   There is no height or weight on file to calculate BMI.  Advanced Directives 06/26/2020 03/27/2018 03/21/2018 10/12/2017 03/24/2015 02/04/2015 12/09/2013  Does Patient Have a Medical Advance Directive? Yes Yes Yes Yes Yes Yes Patient has advance directive, copy not in chart  Type of Advance Directive [place];Living will Living will Living will - [place];Living will [place];Living will [place];Living will  Does patient want to make changes to medical advance directive? No - Patient declined No - Patient declined No - Patient declined - - No - Patient declined -  Copy of [place] in Chart? No - copy requested - - - - No - copy requested Copy requested from family  Would patient like  information on creating a medical advance directive? - - - - - No - patient declined information -  Pre-existing out of facility DNR order (yellow form or pink MOST form) - - - - - - No    Current Medications (verified) Outpatient Encounter Medications as of 06/26/2020  Medication Sig  . acetaminophen (TYLENOL) 500 MG tablet Take 500 mg by mouth as needed.  [name] alendronate (FOSAMAX) 70 MG tablet TAKE 1 TABLET BY MOUTH EVERY 7 DAYS. TAKE WITH A  FULL GLASS OF  WATER  ON AN EMPTY STOMACH  . BIOTIN PO Take by mouth daily.  . calcium carbonate (OS-CAL) 600 MG TABS Take 300 mg by mouth every other day.   . cetirizine (ZYRTEC) 10 MG tablet Take 10 mg by mouth daily as needed for allergies.   . Cholecalciferol (VITAMIN D-3 PO) Take 2,500 Units by mouth daily.   . fish oil-omega-3 fatty acids 1000 MG capsule Take 1 g by mouth daily.   . Magnesium 400 MG CAPS Take 1 tablet by mouth daily.   . Melatonin 3 MG TABS Take 3 mg by mouth every other day.  . Multiple Vitamin (MULTIVITAMIN WITH MINERALS) TABS tablet Take 1 tablet by mouth every morning.  . Turmeric 500 MG TABS Take 500 mg by mouth daily.   . vitamin B-12 (CYANOCOBALAMIN) 500 MCG tablet Take 500 mcg by mouth daily.   No facility-administered encounter medications on file as of 06/26/2020.    Allergies (verified) Patient has no known allergies.   History: Past Medical History:  Diagnosis Date  . Allergy   . Arthritis   . Blood transfusion abn  reaction or complication, no procedure mishap    79 years old after tonsilectomy  . Osteoporosis 04/2019   T score -2.1 improved from prior DEXA  . Vasovagal syncope    2 prior incidences last was in 2015 ; treated in ED , reports no recurrence since then    Past Surgical History:  Procedure Laterality Date  . ABDOMINAL HYSTERECTOMY     partial 1983  . COLONOSCOPY    . FOOT SURGERY     bunectomy, hammertoe, bil  . TONSILLECTOMY  1945  . 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 Heart disease Mother   . Hyperlipidemia Mother   . Stroke Mother   . Heart failure Mother   . Diabetes Father        type ll  . Heart disease Father 1       sudden death age 18  . Hyperlipidemia Father   . Breast cancer Maternal Aunt    Social History   Socioeconomic History  . Marital status: Divorced    Spouse name: Not on file  . Number of children: Not on file  . Years of education: Not on file  . Highest education level: Not on file  Occupational History  . Not on file  Tobacco Use  . Smoking status: Never Smoker  . Smokeless tobacco: Never Used  Vaping Use  . Vaping Use: Never used  Substance and Sexual Activity  . Alcohol use: Yes    Alcohol/week: 5.0 standard drinks    Types: 5 Glasses of wine per week    Comment: glass of wine 3-4 times a week  . Drug use: No  . Sexual activity: Never    Comment: INTERCOURSE AGE 58, SEXUAL PARTNERS LESS THAN 5  Other Topics Concern  . Not on file  Social History Narrative  . Not on file   Social Determinants of Health   Financial Resource Strain: Low Risk   . Difficulty of Paying Living Expenses: Not hard at all  Food Insecurity: No Food Insecurity  . Worried About Charity fundraiser in the Last Year: Never true  . Ran Out of Food in the Last Year: Never true  Transportation Needs: No Transportation Needs  . Lack of Transportation (Medical): No  . Lack of Transportation (Non-Medical): No  Physical Activity: Sufficiently Active  . Days of Exercise per Week: 6 days  . Minutes of Exercise per Session: 40 min  Stress: No Stress Concern Present  . Feeling of Stress : Not at all  Social Connections: Moderately Integrated  . Frequency of Communication with Friends and Family: More than three times a week  . Frequency of Social Gatherings with  Friends and Family: Once a week  . Attends Religious Services: More than 4 times per year  . Active Member of Clubs or Organizations: Yes  . Attends Archivist Meetings: More than 4 times per year  . Marital Status: Divorced    Tobacco Counseling Counseling given: Not Answered   Clinical Intake:  Pre-visit preparation completed: Yes  Pain : No/denies pain     Nutritional Risks: None Diabetes: No  How often do you need to have someone help you when you read instructions, pamphlets, or other written materials from your doctor or pharmacy?: 1 - Never What is the  last grade level you completed in school?: BA degree  Diabetic?No  Interpreter Needed?: No  Information entered by :: [place] of Daily Living In your present state of health, do you have any difficulty performing the following activities: 06/26/2020  Hearing? Y  Comment has bilateral hearing aids  Vision? N  Difficulty concentrating or making decisions? N  Walking or climbing stairs? Y  Comment Has weakness to knees when climbing stairs  Dressing or bathing? N  Doing errands, shopping? N  Preparing Food and eating ? N  Using the Toilet? N  In the past six months, have you accidently leaked urine? Y  Comment Has occassional bladder leakage throughout the night  Do you have problems with loss of bowel control? N  Managing your Medications? N  Managing your Finances? N  Housekeeping or managing your Housekeeping? N  Some recent data might be hidden    Patient Care Team: Eulas Post, MD as PCP - General (Family Medicine)  Indicate any recent Medical Services you may have received from other than [facility] providers in the past year (date may be approximate).     Assessment:   This is a routine wellness examination for [place].  Hearing/Vision screen  Hearing Screening   125Hz  250Hz  500Hz  1000Hz  2000Hz  3000Hz  4000Hz  6000Hz  8000Hz   Right ear:           Left ear:             Vision Screening Comments: Patient states gets eyes checked every other year    Dietary issues and exercise activities discussed: Current Exercise Habits: Home exercise routine;Structured exercise class, Type of exercise: walking;strength training/weights;stretching, Time (Minutes): 45, Frequency (Times/Week): 6, Weekly Exercise (Minutes/Week): 270, Intensity: Moderate, Exercise limited by: None identified  Goals    . Patient Stated     To continue to teach and stay active      . Patient Stated     I will continue to go to exercise for 45 minutes at least 6 days      Depression Screen PHQ 2/9 Scores 06/26/2020 10/16/2019 10/13/2018 10/12/2017 09/29/2016 09/23/2015 09/17/2014  PHQ - 2 Score 0 0 0 0 0 0 0  PHQ- 9 Score 0 0 - - - - -    Fall Risk Fall Risk  06/26/2020 10/16/2019 10/16/2019 10/13/2018 10/12/2017  Falls in the past year? 0 0 0 0 No  Number falls in past yr: 0 - - - -  Injury with Fall? 0 - - - -  Risk for fall due to : Medication side effect No Fall Risks - - -  Follow up Falls evaluation completed;Falls prevention discussed - - - -    Any stairs in or around the home? No  If so, are there any without handrails? No  Home free of loose throw rugs in walkways, pet beds, electrical cords, etc? Yes  Adequate lighting in your home to reduce risk of falls? Yes   ASSISTIVE DEVICES UTILIZED TO PREVENT FALLS:  Life alert? No  Use of a cane, walker or w/c? No  Grab bars in the bathroom? Yes  Shower chair or bench in shower? No  Elevated toilet seat or a handicapped toilet? Yes     Cognitive Function:  Cognitive screening not indicated based on direct observation      Immunizations Immunization History  Administered Date(s) Administered  . Fluad Quad(high Dose 65+) 07/17/2019, 06/24/2020  . Influenza Split 07/22/2011, 08/09/2012  . Influenza, High Dose Seasonal PF  07/24/2015, 07/15/2016, 07/04/2017  . Influenza,inj,Quad PF,6+ Mos 06/26/2013, 07/15/2014  .  Influenza-Unspecified 07/03/2018  . PFIZER SARS-COV-2 Vaccination 10/23/2019, 11/12/2019  . PPD Test 01/19/2013  . Pneumococcal Conjugate-13 10/12/2007, 09/18/2013  . Pneumococcal Polysaccharide-23 09/29/2016  . Tdap 02/02/2011  . Zoster 07/11/2012  . Zoster Recombinat (Shingrix) 12/19/2017    TDAP status: Up to date Flu Vaccine status: Up to date Pneumococcal vaccine status: Up to date Covid-19 vaccine status: Completed vaccines  Qualifies for Shingles Vaccine? Yes   Zostavax completed Yes   Shingrix Completed?: Yes  Screening Tests Health Maintenance  Topic Date Due  . Hepatitis C Screening  Never done  . TETANUS/TDAP  02/01/2021  . INFLUENZA VACCINE  Completed  . DEXA SCAN  Completed  . COVID-19 Vaccine  Completed  . PNA vac Low Risk Adult  Completed    Health Maintenance  Health Maintenance Due  Topic Date Due  . Hepatitis C Screening  Never done    Colorectal cancer screening: Completed 03/24/2015. Repeat every 0 years Mammogram status: Completed 04/15/2020. Repeat every year Bone Density status: Completed 04/18/2019. Results reflect: Bone density results: OSTEOPOROSIS. Repeat every 2 years.  Lung Cancer Screening: (Low Dose CT Chest recommended if Age 2-80 years, 30 pack-year currently smoking OR have quit w/in 15years.) does not qualify.   Lung Cancer Screening Referral: N/A  Additional Screening:  Hepatitis C Screening: does not qualify;  Vision Screening: Recommended annual ophthalmology exams for early detection of glaucoma and other disorders of the eye. Is the patient up to date with their annual eye exam?  Yes  Who is the provider or what is the name of the office in which the patient attends annual eye exams? Dr. Satira Sark  If pt is not established with a provider, would they like to be referred to a provider to establish care? No .   Dental Screening: Recommended annual dental exams for proper oral hygiene  Community Resource Referral / Chronic  Care Management: CRR required this visit?  No   CCM required this visit?  No      Plan:     I have personally reviewed and noted the following in the patient's chart:   . Medical and social history . Use of alcohol, tobacco or illicit drugs  . Current medications and supplements . Functional ability and status . Nutritional status . Physical activity . Advanced directives . List of other physicians . Hospitalizations, surgeries, and ER visits in previous 12 months . Vitals . Screenings to include cognitive, depression, and falls . Referrals and appointments  In addition, I have reviewed and discussed with patient certain preventive protocols, quality metrics, and best practice recommendations. A written personalized care plan for preventive services as well as general preventive health recommendations were provided to patient.     Ofilia Neas, LPN   [id number]   Nurse Notes: None

## 2020-07-22 ENCOUNTER — Ambulatory Visit: Payer: Medicare HMO | Admitting: Dermatology

## 2020-07-30 ENCOUNTER — Ambulatory Visit: Payer: Medicare HMO | Admitting: Dermatology

## 2020-07-30 ENCOUNTER — Encounter: Payer: Self-pay | Admitting: Dermatology

## 2020-07-30 ENCOUNTER — Other Ambulatory Visit: Payer: Self-pay

## 2020-07-30 DIAGNOSIS — L299 Pruritus, unspecified: Secondary | ICD-10-CM | POA: Diagnosis not present

## 2020-07-30 DIAGNOSIS — L821 Other seborrheic keratosis: Secondary | ICD-10-CM | POA: Diagnosis not present

## 2020-07-30 DIAGNOSIS — Z85828 Personal history of other malignant neoplasm of skin: Secondary | ICD-10-CM | POA: Diagnosis not present

## 2020-07-30 DIAGNOSIS — K120 Recurrent oral aphthae: Secondary | ICD-10-CM | POA: Diagnosis not present

## 2020-07-30 DIAGNOSIS — Z1283 Encounter for screening for malignant neoplasm of skin: Secondary | ICD-10-CM

## 2020-07-30 NOTE — Progress Notes (Signed)
Scalp itch treatment otc scalpicin Fever blister v/s cancer sore in mouth

## 2020-07-30 NOTE — Patient Instructions (Signed)
Routine follow-up and complete skin examination for Megan Reilly, date of birth [birth year].  From feet to scalp there are currently no atypical moles, melanoma, or nonmobile skin cancer.  She does have many flat keratoses particularly on the legs more than the arms; there are raised keratoses on the right upper calf, left hip, and shoulder/back areas.  Newer ones are on the upper nose and right sideburn but clinically and with dermoscopy these are benign and safe to leave if stable.  He has modest number of small smooth red cherry angiomas; the lesion on the top of the left foot is darker but dermoscopy confirms it is a benign vascular growth.  She has a history of scalp itching which has responded to T-Gel shampoo; if this stops working she may try over the counter Scalpicin lotion.  Routine follow-up 1 year.

## 2020-07-30 NOTE — Progress Notes (Signed)
   Follow-Up Visit   Subjective  Megan Reilly is a 79 y.o. female who presents for the following: Annual Exam (yearly check-no concerns).  Complete skin examination, history of sore in mouth, history of itchy scalp. Location:  Duration:  Quality:  Associated Signs/Symptoms: Modifying Factors:  Severity:  Timing: Context: History of basal cell skin cancers arm.  Objective  Well appearing patient in no apparent distress; mood and affect are within normal limits.  A full examination was performed including scalp, head, eyes, ears, nose, lips, neck, chest, axillae, abdomen, back, buttocks, bilateral upper extremities, bilateral lower extremities, hands, feet, fingers, toes, fingernails, and toenails. All findings within normal limits unless otherwise noted below.   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  Should this fail, we discussed adding a topical like clobetasol foam and even neuroactive agents like oral gabapentin (these are not currently indicated).  Aphthous ulcer of mouth Right Mandibular Gingiva  This is more likely an aphthous ulcer than HSV, but I will recheck if this recurs.     I, Lavonna Monarch, MD, have reviewed all documentation for this visit.  The documentation on 07/30/20 for the exam, diagnosis, procedures, and orders are all accurate and complete.

## 2020-09-21 ENCOUNTER — Other Ambulatory Visit: Payer: Self-pay | Admitting: Family Medicine

## 2020-09-22 ENCOUNTER — Other Ambulatory Visit: Payer: Self-pay | Admitting: Family Medicine

## 2020-09-22 ENCOUNTER — Telehealth: Payer: Self-pay | Admitting: Family Medicine

## 2020-09-22 DIAGNOSIS — R300 Dysuria: Secondary | ICD-10-CM

## 2020-09-22 NOTE — Telephone Encounter (Signed)
Pt is calling in stating that she would like to have something called in for her frequent urination and pain.  Pt declined to make an appointment due to her having these all the time and knows what it is and normally have something called in for it.  Pharm:  [facility] [address]

## 2020-09-22 NOTE — Telephone Encounter (Signed)
We really need to check urine- at a minimum to avoid over-treatment with antibiotics.  Some painful urination is not infection (eg atrophic vaginitis)

## 2020-09-23 ENCOUNTER — Other Ambulatory Visit: Payer: Self-pay

## 2020-09-23 ENCOUNTER — Other Ambulatory Visit (INDEPENDENT_AMBULATORY_CARE_PROVIDER_SITE_OTHER): Payer: Medicare HMO

## 2020-09-23 DIAGNOSIS — R300 Dysuria: Secondary | ICD-10-CM | POA: Diagnosis not present

## 2020-09-23 LAB — POC URINALSYSI DIPSTICK (AUTOMATED)
Bilirubin, UA: NEGATIVE
Blood, UA: NEGATIVE
Glucose, UA: NEGATIVE
Ketones, UA: NEGATIVE
Nitrite, UA: NEGATIVE
Protein, UA: NEGATIVE
Spec Grav, UA: 1.02 (ref 1.010–1.025)
Urobilinogen, UA: 0.2 E.U./dL
pH, UA: 6.5 (ref 5.0–8.0)

## 2020-09-23 MED ORDER — CEPHALEXIN 500 MG PO CAPS: 500.0000 mg | ORAL_CAPSULE | Freq: Three times a day (TID) | ORAL | 0 refills | Status: DC

## 2020-09-23 NOTE — Addendum Note (Signed)
Addended by: Marrion Coy on: 09/23/2020 03:23 PM   Modules accepted: Orders

## 2020-09-23 NOTE — Telephone Encounter (Signed)
Spoke with the pt and informed her of the message below.  Lab appt scheduled for today to arrive at 3:10pm.

## 2020-09-23 NOTE — Progress Notes (Signed)
Patient informed of the results and she is aware the Rx was sent to [facility].

## 2020-09-23 NOTE — Progress Notes (Signed)
Urine dipstick shows 2+ leukocytes.  I have sent in Keflex 500 mg 3 times daily for 7 days pending culture results.  She should go ahead and start the antibiotic tonight

## 2020-09-23 NOTE — Addendum Note (Signed)
Addended by: Eulas Post on: 09/23/2020 03:24 PM   Modules accepted: Orders

## 2020-09-25 LAB — URINE CULTURE
MICRO NUMBER:: 11315506
SPECIMEN QUALITY:: ADEQUATE

## 2020-10-19 ENCOUNTER — Other Ambulatory Visit: Payer: Self-pay | Admitting: Family Medicine

## 2020-10-21 ENCOUNTER — Encounter: Payer: Self-pay | Admitting: Family Medicine

## 2020-10-21 ENCOUNTER — Ambulatory Visit (INDEPENDENT_AMBULATORY_CARE_PROVIDER_SITE_OTHER): Payer: Medicare HMO | Admitting: Family Medicine

## 2020-10-21 ENCOUNTER — Other Ambulatory Visit: Payer: Self-pay

## 2020-10-21 VITALS — BP 162/80 | HR 47 | Ht 67.25 in | Wt 155.0 lb

## 2020-10-21 DIAGNOSIS — Z0000 Encounter for general adult medical examination without abnormal findings: Secondary | ICD-10-CM | POA: Diagnosis not present

## 2020-10-21 DIAGNOSIS — H9193 Unspecified hearing loss, bilateral: Secondary | ICD-10-CM

## 2020-10-21 DIAGNOSIS — H9190 Unspecified hearing loss, unspecified ear: Secondary | ICD-10-CM | POA: Insufficient documentation

## 2020-10-21 NOTE — Addendum Note (Signed)
Addended by: Janann Colonel on: 10/21/2020 03:49 PM   Modules accepted: Orders

## 2020-10-21 NOTE — Patient Instructions (Addendum)
Preventive Care 80 Years and Older, Female Preventive care refers to lifestyle choices and visits with your health care provider that can promote health and wellness. This includes:  A yearly physical exam. This is also called an annual wellness visit.  Regular dental and eye exams.  Immunizations.  Screening for certain conditions.  Healthy lifestyle choices, such as: ? Eating a healthy diet. ? Getting regular exercise. ? Not using drugs or products that contain nicotine and tobacco. ? Limiting alcohol use. What can I expect for my preventive care visit? Physical exam Your health care provider will check your:  Height and weight. These may be used to calculate your BMI (body mass index). BMI is a measurement that tells if you are at a healthy weight.  Heart rate and blood pressure.  Body temperature.  Skin for abnormal spots. Counseling Your health care provider may ask you questions about your:  Past medical problems.  Family's medical history.  Alcohol, tobacco, and drug use.  Emotional well-being.  Home life and relationship well-being.  Sexual activity.  Diet, exercise, and sleep habits.  History of falls.  Memory and ability to understand (cognition).  Work and work Statistician.  Pregnancy and menstrual history.  Access to firearms. What immunizations do I need? Vaccines are usually given at various ages, according to a schedule. Your health care provider will recommend vaccines for you based on your age, medical history, and lifestyle or other factors, such as travel or where you work.   What tests do I need? Blood tests  Lipid and cholesterol levels. These may be checked every 5 years, or more often depending on your overall health.  Hepatitis C test.  Hepatitis B test. Screening  Lung cancer screening. You may have this screening every year starting at age 80 if you have a 30-pack-year history of smoking and currently smoke or have quit within  the past 15 years.  Colorectal cancer screening. ? All adults should have this screening starting at age 44 and continuing until age 80. ? Your health care provider may recommend screening at age 80 if you are at increased risk. ? You will have tests every 1-10 years, depending on your results and the type of screening test.  Diabetes screening. ? This is done by checking your blood sugar (glucose) after you have not eaten for a while (fasting). ? You may have this done every 1-3 years.  Mammogram. ? This may be done every 1-2 years. ? Talk with your health care provider about how often you should have regular mammograms.  Abdominal aortic aneurysm (AAA) screening. You may need this if you are a current or former smoker.  BRCA-related cancer screening. This may be done if you have a family history of breast, ovarian, tubal, or peritoneal cancers. Other tests  STD (sexually transmitted disease) testing, if you are at risk.  Bone density scan. This is done to screen for osteoporosis. You may have this done starting at age 80. Talk with your health care provider about your test results, treatment options, and if necessary, the need for more tests. Follow these instructions at home: Eating and drinking  Eat a diet that includes fresh fruits and vegetables, whole grains, lean protein, and low-fat dairy products. Limit your intake of foods with high amounts of sugar, saturated fats, and salt.  Take vitamin and mineral supplements as recommended by your health care provider.  Do not drink alcohol if your health care provider tells you not to drink.  If you drink alcohol: ? Limit how much you have to 0-1 drink a day. ? Be aware of how much alcohol is in your drink. In the [place] one drink equals one 12 oz bottle of beer (355 mL), one 5 oz glass of wine (148 mL), or one 1 oz glass of hard liquor (44 mL).   Lifestyle  Take daily care of your teeth and gums. Brush your teeth every morning  and night with fluoride toothpaste. Floss one time each day.  Stay active. Exercise for at least 30 minutes 5 or more days each week.  Do not use any products that contain nicotine or tobacco, such as cigarettes, e-cigarettes, and chewing tobacco. If you need help quitting, ask your health care provider.  Do not use drugs.  If you are sexually active, practice safe sex. Use a condom or other form of protection in order to prevent STIs (sexually transmitted infections).  Talk with your health care provider about taking a low-dose aspirin or statin.  Find healthy ways to cope with stress, such as: ? Meditation, yoga, or listening to music. ? Journaling. ? Talking to a trusted person. ? Spending time with friends and family. Safety  Always wear your seat belt while driving or riding in a vehicle.  Do not drive: ? If you have been drinking alcohol. Do not ride with someone who has been drinking. ? When you are tired or distracted. ? While texting.  Wear a helmet and other protective equipment during sports activities.  If you have firearms in your house, make sure you follow all gun safety procedures. What's next?  Visit your health care provider once a year for an annual wellness visit.  Ask your health care provider how often you should have your eyes and teeth checked.  Stay up to date on all vaccines. This information is not intended to replace advice given to you by your health care provider. Make sure you discuss any questions you have with your health care provider. Document Revised: 09/17/2020 Document Reviewed: 09/21/2018 Elsevier Patient Education  2021 [employer].     https://www.nhlbi.nih.gov/files/docs/public/heart/dash_brief.pdf">  DASH Eating Plan DASH stands for Dietary Approaches to Stop Hypertension. The DASH eating plan is a healthy eating plan that has been shown to:  Reduce high blood pressure (hypertension).  Reduce your risk for type 2 diabetes,  heart disease, and stroke.  Help with weight loss. What are tips for following this plan? Reading food labels  Check food labels for the amount of salt (sodium) per serving. Choose foods with less than 5 percent of the Daily Value of sodium. Generally, foods with less than 300 milligrams (mg) of sodium per serving fit into this eating plan.  To find whole grains, look for the word "whole" as the first word in the ingredient list. Shopping  Buy products labeled as "low-sodium" or "no salt added."  Buy fresh foods. Avoid canned foods and pre-made or frozen meals. Cooking  Avoid adding salt when cooking. Use salt-free seasonings or herbs instead of table salt or sea salt. Check with your health care provider or pharmacist before using salt substitutes.  Do not fry foods. Cook foods using healthy methods such as baking, boiling, grilling, roasting, and broiling instead.  Cook with heart-healthy oils, such as olive, canola, avocado, soybean, or sunflower oil. Meal planning  Eat a balanced diet that includes: ? 4 or more servings of fruits and 4 or more servings of vegetables each day. Try to fill one-half of   your plate with fruits and vegetables. ? 6-8 servings of whole grains each day. ? Less than 6 oz (170 g) of lean meat, poultry, or fish each day. A 3-oz (85-g) serving of meat is about the same size as a deck of cards. One egg equals 1 oz (28 g). ? 2-3 servings of low-fat dairy each day. One serving is 1 cup (237 mL). ? 1 serving of nuts, seeds, or beans 5 times each week. ? 2-3 servings of heart-healthy fats. Healthy fats called omega-3 fatty acids are found in foods such as walnuts, flaxseeds, fortified milks, and eggs. These fats are also found in cold-water fish, such as sardines, salmon, and mackerel.  Limit how much you eat of: ? Canned or prepackaged foods. ? Food that is high in trans fat, such as some fried foods. ? Food that is high in saturated fat, such as fatty  meat. ? Desserts and other sweets, sugary drinks, and other foods with added sugar. ? Full-fat dairy products.  Do not salt foods before eating.  Do not eat more than 4 egg yolks a week.  Try to eat at least 2 vegetarian meals a week.  Eat more home-cooked food and less restaurant, buffet, and fast food.   Lifestyle  When eating at a restaurant, ask that your food be prepared with less salt or no salt, if possible.  If you drink alcohol: ? Limit how much you use to:  0-1 drink a day for women who are not pregnant.  0-2 drinks a day for men. ? Be aware of how much alcohol is in your drink. In the [place] one drink equals one 12 oz bottle of beer (355 mL), one 5 oz glass of wine (148 mL), or one 1 oz glass of hard liquor (44 mL). General information  Avoid eating more than 2,300 mg of salt a day. If you have hypertension, you may need to reduce your sodium intake to 1,500 mg a day.  Work with your health care provider to maintain a healthy body weight or to lose weight. Ask what an ideal weight is for you.  Get at least 30 minutes of exercise that causes your heart to beat faster (aerobic exercise) most days of the week. Activities may include walking, swimming, or biking.  Work with your health care provider or dietitian to adjust your eating plan to your individual calorie needs. What foods should I eat? Fruits All fresh, dried, or frozen fruit. Canned fruit in natural juice (without added sugar). Vegetables Fresh or frozen vegetables (raw, steamed, roasted, or grilled). Low-sodium or reduced-sodium tomato and vegetable juice. Low-sodium or reduced-sodium tomato sauce and tomato paste. Low-sodium or reduced-sodium canned vegetables. Grains Whole-grain or whole-wheat bread. Whole-grain or whole-wheat pasta. Brown rice. Oatmeal. Quinoa. Bulgur. Whole-grain and low-sodium cereals. Pita bread. Low-fat, low-sodium crackers. Whole-wheat flour tortillas. Meats and other  proteins Skinless chicken or turkey. Ground chicken or turkey. Pork with fat trimmed off. Fish and seafood. Egg whites. Dried beans, peas, or lentils. Unsalted nuts, nut butters, and seeds. Unsalted canned beans. Lean cuts of beef with fat trimmed off. Low-sodium, lean precooked or cured meat, such as sausages or meat loaves. Dairy Low-fat (1%) or fat-free (skim) milk. Reduced-fat, low-fat, or fat-free cheeses. Nonfat, low-sodium ricotta or cottage cheese. Low-fat or nonfat yogurt. Low-fat, low-sodium cheese. Fats and oils Soft margarine without trans fats. Vegetable oil. Reduced-fat, low-fat, or light mayonnaise and salad dressings (reduced-sodium). Canola, safflower, olive, avocado, soybean, and sunflower oils. Avocado. Seasonings and   condiments Herbs. Spices. Seasoning mixes without salt. Other foods Unsalted popcorn and pretzels. Fat-free sweets. The items listed above may not be a complete list of foods and beverages you can eat. Contact a dietitian for more information. What foods should I avoid? Fruits Canned fruit in a light or heavy syrup. Fried fruit. Fruit in cream or butter sauce. Vegetables Creamed or fried vegetables. Vegetables in a cheese sauce. Regular canned vegetables (not low-sodium or reduced-sodium). Regular canned tomato sauce and paste (not low-sodium or reduced-sodium). Regular tomato and vegetable juice (not low-sodium or reduced-sodium). Pickles. Olives. Grains Baked goods made with fat, such as croissants, muffins, or some breads. Dry pasta or rice meal packs. Meats and other proteins Fatty cuts of meat. Ribs. Fried meat. Bacon. Bologna, salami, and other precooked or cured meats, such as sausages or meat loaves. Fat from the back of a pig (fatback). Bratwurst. Salted nuts and seeds. Canned beans with added salt. Canned or smoked fish. Whole eggs or egg yolks. Chicken or turkey with skin. Dairy Whole or 2% milk, cream, and half-and-half. Whole or full-fat cream  cheese. Whole-fat or sweetened yogurt. Full-fat cheese. Nondairy creamers. Whipped toppings. Processed cheese and cheese spreads. Fats and oils Butter. Stick margarine. Lard. Shortening. Ghee. Bacon fat. Tropical oils, such as coconut, palm kernel, or palm oil. Seasonings and condiments Onion salt, garlic salt, seasoned salt, table salt, and sea salt. Worcestershire sauce. Tartar sauce. Barbecue sauce. Teriyaki sauce. Soy sauce, including reduced-sodium. Steak sauce. Canned and packaged gravies. Fish sauce. Oyster sauce. Cocktail sauce. Store-bought horseradish. Ketchup. Mustard. Meat flavorings and tenderizers. Bouillon cubes. Hot sauces. Pre-made or packaged marinades. Pre-made or packaged taco seasonings. Relishes. Regular salad dressings. Other foods Salted popcorn and pretzels. The items listed above may not be a complete list of foods and beverages you should avoid. Contact a dietitian for more information. Where to find more information  National Heart, Lung, and Blood Institute: www.nhlbi.nih.gov  American Heart Association: www.heart.org  Academy of Nutrition and Dietetics: www.eatright.org  National Kidney Foundation: www.kidney.org Summary  The DASH eating plan is a healthy eating plan that has been shown to reduce high blood pressure (hypertension). It may also reduce your risk for type 2 diabetes, heart disease, and stroke.  When on the DASH eating plan, aim to eat more fresh fruits and vegetables, whole grains, lean proteins, low-fat dairy, and heart-healthy fats.  With the DASH eating plan, you should limit salt (sodium) intake to 2,300 mg a day. If you have hypertension, you may need to reduce your sodium intake to 1,500 mg a day.  Work with your health care provider or dietitian to adjust your eating plan to your individual calorie needs. This information is not intended to replace advice given to you by your health care provider. Make sure you discuss any questions you  have with your health care provider. Document Revised: 08/31/2019 Document Reviewed: 08/31/2019 Elsevier Patient Education  2021 [employer] Inc.  

## 2020-10-21 NOTE — Progress Notes (Signed)
Established Patient Office Visit  Subjective:  Patient ID: Megan Reilly, female    DOB: [birth year]  Age: 80 y.o. MRN: [id number]  CC:  Chief Complaint  Patient presents with  . Annual Exam    HPI Megan Reilly presents for complete physical.  Her chronic problems include remote history of subarachnoid bleed following injury, osteoporosis, osteoarthritis, history of vasovagal syncope.  She has had previous total knee arthroplasty and has done well since then.  No recent falls.  She has chronic bilateral hearing loss.  She does take Fosamax for osteoporosis and she thinks she has been on this for 5 years but is not sure.  We have been prescribing this for about 2-1/2 years but she had gotten this from her GYN for least a couple years prior to transferring prescription here.  She plans to get follow-up DEXA this May.  Still gets regular mammograms.  She has aged out of colonoscopies. Has had flu vaccine.  Pneumonia vaccines up-to-date.  She plans to get Shingrix vaccine at pharmacy this year.  Tetanus due later this year.  No history of hepatitis C screening but low risk.  She has had COVID vaccines including booster  She had some atypical headaches several months ago.  She initially had CT head which was unremarkable.  MRI brain revealed no acute abnormality.  She had a couple of benign-appearing meningiomas.  No headaches since then.  Family history-Father had type 2 diabetes, hyperlipidemia, sudden cardiac death age 82.  Mother had history of heart disease, hyperlipidemia, stroke.  Social history-she is divorced.  She has couple children.  Non-smoker.  No regular alcohol.  Past Medical History:  Diagnosis Date  . Allergy   . Arthritis   . Basal cell carcinoma 10/09/2012   right upper arm tx with bx  . BCC (basal cell carcinoma of skin) 12/03/2014   right upper arm bcc nod. tx with bx  . BCC (basal cell carcinoma of skin) 11/04/2015   right upper arm tx exc  . Blood  transfusion abn reaction or complication, no procedure mishap    80 years old after tonsilectomy  . Osteoporosis 04/2019   T score -2.1 improved from prior DEXA  . Vasovagal syncope    2 prior incidences last was in 2015 ; treated in ED , reports no recurrence since then     Past Surgical History:  Procedure Laterality Date  . ABDOMINAL HYSTERECTOMY     partial 1983  . COLONOSCOPY    . FOOT SURGERY     bunectomy, hammertoe, bil  . TONSILLECTOMY  1945  . 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 Heart disease Mother   . Hyperlipidemia Mother   . Stroke Mother   . Heart failure Mother   . Diabetes Father        type ll  . Heart disease Father 69       sudden death age 33  . Hyperlipidemia Father   . Breast cancer Maternal Aunt     Social History   Socioeconomic History  . Marital status: Divorced    Spouse name: Not on file  . Number of children: Not on file  . Years of education: Not on file  . Highest education level: Not on file  Occupational History  . Not  on file  Tobacco Use  . Smoking status: Never Smoker  . Smokeless tobacco: Never Used  Vaping Use  . Vaping Use: Never used  Substance and Sexual Activity  . Alcohol use: Yes    Alcohol/week: 5.0 standard drinks    Types: 5 Glasses of wine per week    Comment: glass of wine 3-4 times a week  . Drug use: No  . Sexual activity: Never    Comment: INTERCOURSE AGE 18, SEXUAL PARTNERS LESS THAN 5  Other Topics Concern  . Not on file  Social History Narrative  . Not on file   Social Determinants of Health   Financial Resource Strain: Low Risk   . Difficulty of Paying Living Expenses: Not hard at all  Food Insecurity: No Food Insecurity  . Worried About Charity fundraiser in the Last Year: Never true  . Ran Out of Food  in the Last Year: Never true  Transportation Needs: No Transportation Needs  . Lack of Transportation (Medical): No  . Lack of Transportation (Non-Medical): No  Physical Activity: Sufficiently Active  . Days of Exercise per Week: 6 days  . Minutes of Exercise per Session: 40 min  Stress: No Stress Concern Present  . Feeling of Stress : Not at all  Social Connections: Moderately Integrated  . Frequency of Communication with Friends and Family: More than three times a week  . Frequency of Social Gatherings with Friends and Family: Once a week  . Attends Religious Services: More than 4 times per year  . Active Member of Clubs or Organizations: Yes  . Attends Archivist Meetings: More than 4 times per year  . Marital Status: Divorced  Human resources officer Violence: Not At Risk  . Fear of Current or Ex-Partner: No  . Emotionally Abused: No  . Physically Abused: No  . Sexually Abused: No    Outpatient Medications Prior to Visit  Medication Sig Dispense Refill  . acetaminophen (TYLENOL) 500 MG tablet Take 500 mg by mouth as needed.    [name] alendronate (FOSAMAX) 70 MG tablet TAKE 1 TABLET BY MOUTH ONCE A WEEK WITH  A  FULL  GLASS  OF  WATER  ON  AN  EMPTY  STOMACH 4 tablet 0  . BIOTIN PO Take by mouth daily.    . calcium carbonate (OS-CAL) 600 MG TABS Take 300 mg by mouth every other day.     . cephALEXin (KEFLEX) 500 MG capsule Take 1 capsule (500 mg total) by mouth 3 (three) times daily. 21 capsule 0  . cetirizine (ZYRTEC) 10 MG tablet Take 10 mg by mouth daily as needed for allergies.     . Cholecalciferol (VITAMIN D-3 PO) Take 2,500 Units by mouth daily.     . fish oil-omega-3 fatty acids 1000 MG capsule Take 1 g by mouth daily.     . Magnesium 400 MG CAPS Take 1 tablet by mouth daily.     . Melatonin 3 MG TABS Take 3 mg by mouth every other day.    . Multiple Vitamin (MULTIVITAMIN WITH MINERALS) TABS tablet Take 1 tablet by mouth every morning.    . Turmeric 500 MG TABS Take 500  mg by mouth daily.     . vitamin B-12 (CYANOCOBALAMIN) 500 MCG tablet Take 500 mcg by mouth daily.     No facility-administered medications prior to visit.    No Known Allergies  ROS Review of Systems  Constitutional: Negative for activity change, appetite change, fatigue,  fever and unexpected weight change.  HENT: Negative for ear pain, hearing loss, sore throat and trouble swallowing.   Eyes: Negative for visual disturbance.  Respiratory: Negative for cough and shortness of breath.   Cardiovascular: Negative for chest pain and palpitations.  Gastrointestinal: Negative for abdominal pain, blood in stool, constipation and diarrhea.  Endocrine: Negative for polydipsia and polyuria.  Genitourinary: Negative for dysuria and hematuria.  Musculoskeletal: Negative for arthralgias, back pain and myalgias.  Skin: Negative for rash.  Neurological: Negative for dizziness, syncope and headaches.  Hematological: Negative for adenopathy.  Psychiatric/Behavioral: Negative for confusion and dysphoric mood.      Objective:    Physical Exam Vitals reviewed.  Constitutional:      Appearance: Normal appearance.  HENT:     Head: Normocephalic and atraumatic.     Ears:     Comments: She does have a small amount of wax in both ear canals but not obstructing Cardiovascular:     Rate and Rhythm: Normal rate and regular rhythm.  Pulmonary:     Effort: Pulmonary effort is normal.     Breath sounds: Normal breath sounds.  Abdominal:     Palpations: Abdomen is soft.     Tenderness: There is no abdominal tenderness. There is no guarding or rebound.  Musculoskeletal:     Cervical back: Neck supple.     Right lower leg: No edema.     Left lower leg: No edema.  Lymphadenopathy:     Cervical: No cervical adenopathy.  Neurological:     General: No focal deficit present.     Mental Status: She is alert.     Cranial Nerves: No cranial nerve deficit.  Psychiatric:        Mood and Affect: Mood  normal.     BP (!) 162/80 (BP Location: Left Arm, Cuff Size: Normal)   Pulse (!) 47   Ht 5' 7.25" (1.708 m)   Wt 155 lb (70.3 kg)   SpO2 98%   BMI 24.10 kg/m  Wt Readings from Last 3 Encounters:  10/21/20 155 lb (70.3 kg)  06/05/20 150 lb (68 kg)  04/22/20 152 lb 1.6 oz (69 kg)     Health Maintenance Due  Topic Date Due  . Hepatitis C Screening  Never done    There are no preventive care reminders to display for this patient.  Lab Results  Component Value Date   TSH 1.82 10/16/2019   Lab Results  Component Value Date   WBC 6.9 10/16/2019   HGB 13.9 10/16/2019   HCT 41.4 10/16/2019   MCV 97.3 10/16/2019   PLT 177.0 10/16/2019   Lab Results  Component Value Date   NA 142 10/16/2019   K 4.3 10/16/2019   CO2 29 10/16/2019   GLUCOSE 105 (H) 10/16/2019   BUN 14 10/16/2019   CREATININE 0.61 10/16/2019   BILITOT 0.9 10/16/2019   ALKPHOS 54 10/16/2019   AST 20 10/16/2019   ALT 12 10/16/2019   PROT 6.3 10/16/2019   ALBUMIN 4.4 10/16/2019   CALCIUM 10.4 10/16/2019   ANIONGAP 8 03/28/2018   GFR 94.66 10/16/2019   Lab Results  Component Value Date   CHOL 207 (H) 10/16/2019   Lab Results  Component Value Date   HDL 60.60 10/16/2019   Lab Results  Component Value Date   LDLCALC 123 (H) 10/16/2019   Lab Results  Component Value Date   TRIG 115.0 10/16/2019   Lab Results  Component Value Date   CHOLHDL  3 10/16/2019   Lab Results  Component Value Date   HGBA1C 5.8 (H) 12/10/2013      Assessment & Plan:   Problem List Items Addressed This Visit   None   Visit Diagnoses    Physical exam    -  Primary   Relevant Orders   Basic metabolic panel   Lipid panel   CBC with Differential/Platelet   TSH   Hepatic function panel   Hep C Antibody    We discussed the following health maintenance issues- -check hepatitis C antibody -Obtain screening labs as above -Continue regular weightbearing exercise with walking and regular calcium and vitamin D -  she has follow-up DEXA scan scheduled for this May -Consider discontinue Fosamax around May which will apparently be about 5 years of therapy -Consider over-the-counter Debrox or Cerumenex for her earwax -Blood pressure was elevated today and this has not been up previously.  We recommended she watch her salt intake closely and information given on DASH diet.  Bring back for reassessment in 3 to 4 weeks.  If still up at that time consider starting medication.  We have also encouraged her to consider home blood pressure monitor and record readings and bring back for review at office follow-up  No orders of the defined types were placed in this encounter.   Follow-up: Return in about 3 weeks (around 11/11/2020).    Carolann Littler, MD

## 2020-10-24 ENCOUNTER — Other Ambulatory Visit: Payer: Self-pay

## 2020-10-24 ENCOUNTER — Other Ambulatory Visit: Payer: Medicare HMO

## 2020-10-24 ENCOUNTER — Other Ambulatory Visit (INDEPENDENT_AMBULATORY_CARE_PROVIDER_SITE_OTHER): Payer: Medicare HMO

## 2020-10-24 DIAGNOSIS — H9193 Unspecified hearing loss, bilateral: Secondary | ICD-10-CM

## 2020-10-24 DIAGNOSIS — Z0000 Encounter for general adult medical examination without abnormal findings: Secondary | ICD-10-CM | POA: Diagnosis not present

## 2020-10-24 DIAGNOSIS — H43813 Vitreous degeneration, bilateral: Secondary | ICD-10-CM | POA: Diagnosis not present

## 2020-10-24 DIAGNOSIS — H524 Presbyopia: Secondary | ICD-10-CM | POA: Diagnosis not present

## 2020-10-24 DIAGNOSIS — H25013 Cortical age-related cataract, bilateral: Secondary | ICD-10-CM | POA: Diagnosis not present

## 2020-10-24 DIAGNOSIS — H40023 Open angle with borderline findings, high risk, bilateral: Secondary | ICD-10-CM | POA: Diagnosis not present

## 2020-10-24 LAB — BASIC METABOLIC PANEL
BUN: 17 mg/dL (ref 6–23)
CO2: 29 mEq/L (ref 19–32)
Calcium: 10.6 mg/dL — ABNORMAL HIGH (ref 8.4–10.5)
Chloride: 104 mEq/L (ref 96–112)
Creatinine, Ser: 0.7 mg/dL (ref 0.40–1.20)
GFR: 82.02 mL/min (ref >60.00)
Glucose, Bld: 102 mg/dL — ABNORMAL HIGH (ref 70–99)
Potassium: 4 mEq/L (ref 3.5–5.1)
Sodium: 140 mEq/L (ref 135–145)

## 2020-10-24 LAB — HEPATIC FUNCTION PANEL
ALT: 15 U/L (ref 0–35)
AST: 20 U/L (ref 0–37)
Albumin: 4.6 g/dL (ref 3.5–5.2)
Alkaline Phosphatase: 55 U/L (ref 39–117)
Bilirubin, Direct: 0.2 mg/dL (ref 0.0–0.3)
Total Bilirubin: 0.9 mg/dL (ref 0.2–1.2)
Total Protein: 6.4 g/dL (ref 6.0–8.3)

## 2020-10-24 LAB — LIPID PANEL
Cholesterol: 194 mg/dL (ref 0–200)
HDL: 67.1 mg/dL (ref >39.00)
LDL Cholesterol: 108 mg/dL — ABNORMAL HIGH (ref 0–99)
NonHDL: 127.15
Total CHOL/HDL Ratio: 3
Triglycerides: 95 mg/dL (ref 0.0–149.0)
VLDL: 19 mg/dL (ref 0.0–40.0)

## 2020-10-24 LAB — CBC WITH DIFFERENTIAL/PLATELET
Basophils Absolute: 0 10*3/uL (ref 0.0–0.1)
Basophils Relative: 0.4 % (ref 0.0–3.0)
Eosinophils Absolute: 0.1 10*3/uL (ref 0.0–0.7)
Eosinophils Relative: 2.4 % (ref 0.0–5.0)
HCT: 42.6 % (ref 36.0–46.0)
Hemoglobin: 14.5 g/dL (ref 12.0–15.0)
Lymphocytes Relative: 33.1 % (ref 12.0–46.0)
Lymphs Abs: 1.6 10*3/uL (ref 0.7–4.0)
MCHC: 34 g/dL (ref 30.0–36.0)
MCV: 95.5 fl (ref 78.0–100.0)
Monocytes Absolute: 0.4 10*3/uL (ref 0.1–1.0)
Monocytes Relative: 8.3 % (ref 3.0–12.0)
Neutro Abs: 2.7 10*3/uL (ref 1.4–7.7)
Neutrophils Relative %: 55.8 % (ref 43.0–77.0)
Platelets: 196 10*3/uL (ref 150.0–400.0)
RBC: 4.46 Mil/uL (ref 3.87–5.11)
RDW: 12.3 % (ref 11.5–15.5)
WBC: 4.9 10*3/uL (ref 4.0–10.5)

## 2020-10-24 LAB — TSH: TSH: 2.25 u[IU]/mL (ref 0.35–4.50)

## 2020-10-28 LAB — HEPATITIS C ANTIBODY
Hepatitis C Ab: NONREACTIVE
SIGNAL TO CUT-OFF: 0.01 (ref <1.00)

## 2020-11-18 ENCOUNTER — Other Ambulatory Visit: Payer: Self-pay

## 2020-11-18 ENCOUNTER — Encounter: Payer: Self-pay | Admitting: Family Medicine

## 2020-11-18 ENCOUNTER — Ambulatory Visit (INDEPENDENT_AMBULATORY_CARE_PROVIDER_SITE_OTHER): Payer: Medicare HMO | Admitting: Family Medicine

## 2020-11-18 VITALS — BP 152/80 | HR 61 | Temp 98.1°F | Ht 67.25 in | Wt 157.5 lb

## 2020-11-18 DIAGNOSIS — D429 Neoplasm of uncertain behavior of meninges, unspecified: Secondary | ICD-10-CM | POA: Diagnosis not present

## 2020-11-18 DIAGNOSIS — I10 Essential (primary) hypertension: Secondary | ICD-10-CM | POA: Diagnosis not present

## 2020-11-18 DIAGNOSIS — M810 Age-related osteoporosis without current pathological fracture: Secondary | ICD-10-CM | POA: Diagnosis not present

## 2020-11-18 MED ORDER — HYDROCHLOROTHIAZIDE 12.5 MG PO CAPS: 12.5000 mg | ORAL_CAPSULE | Freq: Every day | ORAL | 11 refills | Status: DC

## 2020-11-18 MED ORDER — ALENDRONATE SODIUM 70 MG PO TABS: ORAL_TABLET | ORAL | 5 refills | Status: DC

## 2020-11-18 NOTE — Patient Instructions (Signed)

## 2020-11-18 NOTE — Progress Notes (Signed)
Established Patient Office Visit  Subjective:  Patient ID: Megan Reilly, female    DOB: 1940-11-10  Age: 80 y.o. MRN: [id number]  CC:  Chief Complaint  Patient presents with  . Follow-up    Elevated BP    HPI [facility] presents for follow-up regarding elevated blood pressure.  Refer to recent physical.  She had blood pressure 162/80.  No history of hypertension.  She does eat a lot of sodium.  Yesterday she had canned soup with cheese sandwich.  She has not reduced her sodium intake.  She does walk some for exercise.  Not monitoring blood pressures at home.  No recent headaches.  No dizziness.  Never treated for hypertension previously.  No regular alcohol use.  No regular nonsteroidal use.  She does have osteoporosis history.  She was started on Fosamax by GYN and she is not sure exactly when.  She thinks this is getting close to the 5-year mark.  She does request refills of Fosamax today.  She takes calcium and vitamin D.  Due for repeat bone density this summer  She is scheduled for cataract surgery right eye later this week. She has meningiomas which were incidentally noted on MRI several months ago.  She has follow-up with neurosurgeon for repeat MRI later this month.  Past Medical History:  Diagnosis Date  . Allergy   . Arthritis   . Basal cell carcinoma 10/09/2012   right upper arm tx with bx  . BCC (basal cell carcinoma of skin) 12/03/2014   right upper arm bcc nod. tx with bx  . BCC (basal cell carcinoma of skin) 11/04/2015   right upper arm tx exc  . Blood transfusion abn reaction or complication, no procedure mishap    80 years old after tonsilectomy  . Osteoporosis 04/2019   T score -2.1 improved from prior DEXA  . Vasovagal syncope    2 prior incidences last was in 2015 ; treated in ED , reports no recurrence since then     Past Surgical History:  Procedure Laterality Date  . ABDOMINAL HYSTERECTOMY     partial 1983  . COLONOSCOPY    . FOOT  SURGERY     bunectomy, hammertoe, bil  . TONSILLECTOMY  1945  . 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 Heart disease Mother   . Hyperlipidemia Mother   . Stroke Mother   . Heart failure Mother   . Diabetes Father        type ll  . Heart disease Father 63       sudden death age 81  . Hyperlipidemia Father   . Breast cancer Maternal Aunt     Social History   Socioeconomic History  . Marital status: Divorced    Spouse name: Not on file  . Number of children: Not on file  . Years of education: Not on file  . Highest education level: Not on file  Occupational History  . Not on file  Tobacco Use  . Smoking status: Never Smoker  . Smokeless tobacco: Never Used  Vaping Use  . Vaping Use: Never used  Substance and Sexual Activity  . Alcohol use: Yes    Alcohol/week: 5.0 standard drinks    Types: 5 Glasses of wine per week    Comment:  glass of wine 3-4 times a week  . Drug use: No  . Sexual activity: Never    Comment: INTERCOURSE AGE 89, SEXUAL PARTNERS LESS THAN 5  Other Topics Concern  . Not on file  Social History Narrative  . Not on file   Social Determinants of Health   Financial Resource Strain: Low Risk   . Difficulty of Paying Living Expenses: Not hard at all  Food Insecurity: No Food Insecurity  . Worried About Charity fundraiser in the Last Year: Never true  . Ran Out of Food in the Last Year: Never true  Transportation Needs: No Transportation Needs  . Lack of Transportation (Medical): No  . Lack of Transportation (Non-Medical): No  Physical Activity: Sufficiently Active  . Days of Exercise per Week: 6 days  . Minutes of Exercise per Session: 40 min  Stress: No Stress Concern Present  . Feeling of Stress : Not at all  Social Connections: Moderately Integrated  .  Frequency of Communication with Friends and Family: More than three times a week  . Frequency of Social Gatherings with Friends and Family: Once a week  . Attends Religious Services: More than 4 times per year  . Active Member of Clubs or Organizations: Yes  . Attends Archivist Meetings: More than 4 times per year  . Marital Status: Divorced  Human resources officer Violence: Not At Risk  . Fear of Current or Ex-Partner: No  . Emotionally Abused: No  . Physically Abused: No  . Sexually Abused: No    Outpatient Medications Prior to Visit  Medication Sig Dispense Refill  . acetaminophen (TYLENOL) 500 MG tablet Take 500 mg by mouth as needed.    . calcium carbonate (OS-CAL) 600 MG TABS Take 300 mg by mouth every other day.    . cetirizine (ZYRTEC) 10 MG tablet Take 10 mg by mouth daily as needed for allergies.     . Cholecalciferol (VITAMIN D-3 PO) Take 2,500 Units by mouth daily.     . fish oil-omega-3 fatty acids 1000 MG capsule Take 1 g by mouth daily.     . Magnesium 400 MG CAPS Take 1 tablet by mouth daily.     . Melatonin 3 MG TABS Take 3 mg by mouth every other day.    . Multiple Vitamin (MULTIVITAMIN WITH MINERALS) TABS tablet Take 1 tablet by mouth every morning.    . Turmeric 500 MG TABS Take 500 mg by mouth as needed.    [name] alendronate (FOSAMAX) 70 MG tablet TAKE 1 TABLET BY MOUTH ONCE A WEEK WITH  A  FULL  GLASS  OF  WATER  ON  AN  EMPTY  STOMACH 4 tablet 0  . BIOTIN PO Take by mouth daily.    . cephALEXin (KEFLEX) 500 MG capsule Take 1 capsule (500 mg total) by mouth 3 (three) times daily. 21 capsule 0  . vitamin B-12 (CYANOCOBALAMIN) 500 MCG tablet Take 500 mcg by mouth daily.     No facility-administered medications prior to visit.    No Known Allergies  ROS Review of Systems  Constitutional: Negative for fatigue and unexpected weight change.  Eyes: Negative for visual disturbance.  Respiratory: Negative for cough, chest tightness, shortness of breath and  wheezing.   Cardiovascular: Negative for chest pain, palpitations and leg swelling.  Neurological: Negative for dizziness, seizures, syncope, weakness, light-headedness and headaches.      Objective:    Physical Exam Constitutional:      Appearance:  She is well-developed and well-nourished.  Eyes:     Pupils: Pupils are equal, round, and reactive to light.  Neck:     Thyroid: No thyromegaly.     Vascular: No JVD.  Cardiovascular:     Rate and Rhythm: Normal rate and regular rhythm.     Heart sounds: No gallop.   Pulmonary:     Effort: Pulmonary effort is normal. No respiratory distress.     Breath sounds: Normal breath sounds. No wheezing or rales.  Musculoskeletal:     Cervical back: Neck supple.     Right lower leg: No edema.     Left lower leg: No edema.  Neurological:     Mental Status: She is alert.     BP (!) 152/80 (BP Location: Left Arm, Cuff Size: Normal)   Pulse 61   Temp 98.1 F (36.7 C) (Oral)   Ht 5' 7.25" (1.708 m)   Wt 157 lb 8 oz (71.4 kg)   SpO2 91%   BMI 24.48 kg/m  Wt Readings from Last 3 Encounters:  11/18/20 157 lb 8 oz (71.4 kg)  10/21/20 155 lb (70.3 kg)  06/05/20 150 lb (68 kg)     There are no preventive care reminders to display for this patient.  There are no preventive care reminders to display for this patient.  Lab Results  Component Value Date   TSH 2.25 10/24/2020   Lab Results  Component Value Date   WBC 4.9 10/24/2020   HGB 14.5 10/24/2020   HCT 42.6 10/24/2020   MCV 95.5 10/24/2020   PLT 196.0 10/24/2020   Lab Results  Component Value Date   NA 140 10/24/2020   K 4.0 10/24/2020   CO2 29 10/24/2020   GLUCOSE 102 (H) 10/24/2020   BUN 17 10/24/2020   CREATININE 0.70 10/24/2020   BILITOT 0.9 10/24/2020   ALKPHOS 55 10/24/2020   AST 20 10/24/2020   ALT 15 10/24/2020   PROT 6.4 10/24/2020   ALBUMIN 4.6 10/24/2020   CALCIUM 10.6 (H) 10/24/2020   ANIONGAP 8 03/28/2018   GFR 82.02 10/24/2020   Lab Results   Component Value Date   CHOL 194 10/24/2020   Lab Results  Component Value Date   HDL 67.10 10/24/2020   Lab Results  Component Value Date   LDLCALC 108 (H) 10/24/2020   Lab Results  Component Value Date   TRIG 95.0 10/24/2020   Lab Results  Component Value Date   CHOLHDL 3 10/24/2020   Lab Results  Component Value Date   HGBA1C 5.8 (H) 12/10/2013      Assessment & Plan:   #1 isolated systolic hypertension.  She has had several elevated systolic readings now. -We again discussed nonpharmacologic management with trying to keep her sodium intake from 2000 mg/day or less and regular aerobic exercise such as walking -We discussed options for treating her systolic hypertension including low-dose calcium channel blocker versus thiazide.  With her osteoporosis history thiazides are reasonable choice as it may help with reducing fracture risk.  We did discuss potential adverse issues such as hyponatremia and hypokalemia.  She has good diet intake though and eats pretty balanced diet -Start HCTZ 12.5 mg daily and bring back to reassess in 1 month and plan follow-up basic metabolic panel then  #2 osteoporosis.  Currently on Fosamax. -We have asked that she confirm, if possible, with her prior GYN practice how long she has been on Fosamax and if this is been 5 years she will  discontinue. -She did request refill of Fosamax until this is clarified and this is sent to her pharmacy.  Meds ordered this encounter  Medications  . hydrochlorothiazide (MICROZIDE) 12.5 MG capsule    Sig: Take 1 capsule (12.5 mg total) by mouth daily.    Dispense:  30 capsule    Refill:  11  . alendronate (FOSAMAX) 70 MG tablet    Sig: TAKE 1 TABLET BY MOUTH ONCE A WEEK WITH  A  FULL  GLASS  OF  WATER  ON  AN  EMPTY  STOMACH    Dispense:  4 tablet    Refill:  5    Follow-up: Return in about 1 month (around 12/16/2020).    Carolann Littler, MD

## 2020-11-19 ENCOUNTER — Telehealth: Payer: Self-pay | Admitting: *Deleted

## 2020-11-19 NOTE — Telephone Encounter (Signed)
Patient called and left message in triage voicemail asking how long she has been on Fosamax 70 mg tablet. I called patient back however she did not pick up. I left a message for her to call me to let her know it appears Dr. Elease Hashimoto (PCP) has been refilling her Rx for fosamax and not this office.

## 2020-11-20 DIAGNOSIS — H25811 Combined forms of age-related cataract, right eye: Secondary | ICD-10-CM | POA: Diagnosis not present

## 2020-11-20 DIAGNOSIS — H25011 Cortical age-related cataract, right eye: Secondary | ICD-10-CM | POA: Diagnosis not present

## 2020-11-20 DIAGNOSIS — H25041 Posterior subcapsular polar age-related cataract, right eye: Secondary | ICD-10-CM | POA: Diagnosis not present

## 2020-11-20 DIAGNOSIS — H2511 Age-related nuclear cataract, right eye: Secondary | ICD-10-CM | POA: Diagnosis not present

## 2020-11-26 DIAGNOSIS — D320 Benign neoplasm of cerebral meninges: Secondary | ICD-10-CM | POA: Diagnosis not present

## 2020-11-26 DIAGNOSIS — D329 Benign neoplasm of meninges, unspecified: Secondary | ICD-10-CM | POA: Diagnosis not present

## 2020-12-03 ENCOUNTER — Encounter: Payer: Self-pay | Admitting: Family Medicine

## 2020-12-04 ENCOUNTER — Other Ambulatory Visit: Payer: Self-pay

## 2020-12-04 DIAGNOSIS — H2512 Age-related nuclear cataract, left eye: Secondary | ICD-10-CM | POA: Diagnosis not present

## 2020-12-04 DIAGNOSIS — H25012 Cortical age-related cataract, left eye: Secondary | ICD-10-CM | POA: Diagnosis not present

## 2020-12-04 DIAGNOSIS — H25812 Combined forms of age-related cataract, left eye: Secondary | ICD-10-CM | POA: Diagnosis not present

## 2020-12-04 MED ORDER — LOSARTAN POTASSIUM 50 MG PO TABS: 50.0000 mg | ORAL_TABLET | Freq: Every day | ORAL | 5 refills | Status: DC

## 2020-12-09 ENCOUNTER — Telehealth: Payer: Self-pay | Admitting: Family Medicine

## 2020-12-09 NOTE — Telephone Encounter (Signed)
Stomach upset is not a common symptom with losartan. I would take medication with food and give this a few more days unless her symptoms of dizziness are severe. If she is having severe dizziness set up follow-up so we can reassess.

## 2020-12-09 NOTE — Telephone Encounter (Signed)
Pt is calling in stating that the Rx losartan is making her stomach upset when she had taken it on Saturday and Sunday and today pt stated that at 3:00 she was dizzy not sure if it was from the medication.  Pt stated that when she went with her son to the doctor he checked her Bp and it was 131/77.  Pt just don't know what she should do if she should continue to take the medication or what.  Pt wanted to speak with someone and was transferred to the Triage Nurse.

## 2020-12-10 ENCOUNTER — Encounter: Payer: Self-pay | Admitting: Family Medicine

## 2020-12-10 ENCOUNTER — Ambulatory Visit (INDEPENDENT_AMBULATORY_CARE_PROVIDER_SITE_OTHER): Payer: Medicare HMO | Admitting: Family Medicine

## 2020-12-10 ENCOUNTER — Other Ambulatory Visit: Payer: Self-pay

## 2020-12-10 VITALS — BP 120/70 | HR 68 | Ht 67.25 in | Wt 154.0 lb

## 2020-12-10 DIAGNOSIS — I10 Essential (primary) hypertension: Secondary | ICD-10-CM

## 2020-12-10 NOTE — Telephone Encounter (Signed)
Patient seen in office

## 2020-12-10 NOTE — Patient Instructions (Signed)
Let's try to continue with the Losartan 50 mg daily  If consistently getting light headed with standing, may cut the tablet in half  Stay well hydrated!

## 2020-12-10 NOTE — Progress Notes (Signed)
Established Patient Office Visit  Subjective:  Patient ID: Megan Reilly, female    DOB: October 30, 1940  Age: 80 y.o. MRN: [id number]  CC:  Chief Complaint  Patient presents with  . Hypertension    HPI [name] presents for some recent lightheadedness and dizziness.  She has hypertension and was started recently on HCTZ.  She has some fatigue and burning with urination when she started HCTZ.  We had her stop that and started losartan 50 mg daily.  She did not take her first dose to last Friday.  Saturday she developed some diarrhea and abdominal cramps and she stopped the losartan after couple days.  She did not take any on Sunday or Monday.  She started back yesterday and has not had any recurrent GI symptoms.  She did have some mild lightheadedness yesterday.  She has had vasovagal syncope in the past and had some concerns because of that.  She feels like she is hydrating fairly well Is not having consistent orthostatic type symptoms.  Past Medical History:  Diagnosis Date  . Allergy   . Arthritis   . Basal cell carcinoma 10/09/2012   right upper arm tx with bx  . BCC (basal cell carcinoma of skin) 12/03/2014   right upper arm bcc nod. tx with bx  . BCC (basal cell carcinoma of skin) 11/04/2015   right upper arm tx exc  . Blood transfusion abn reaction or complication, no procedure mishap    80 years old after tonsilectomy  . Osteoporosis 04/2019   T score -2.1 improved from prior DEXA  . Vasovagal syncope    2 prior incidences last was in 2015 ; treated in ED , reports no recurrence since then     Past Surgical History:  Procedure Laterality Date  . ABDOMINAL HYSTERECTOMY     partial 1983  . COLONOSCOPY    . FOOT SURGERY     bunectomy, hammertoe, bil  . TONSILLECTOMY  1945  . 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 Heart disease Mother   . Hyperlipidemia Mother   . Stroke Mother   . Heart failure Mother   . Diabetes Father        type ll  . Heart disease Father 68       sudden death age 51  . Hyperlipidemia Father   . Breast cancer Maternal Aunt     Social History   Socioeconomic History  . Marital status: Divorced    Spouse name: Not on file  . Number of children: Not on file  . Years of education: Not on file  . Highest education level: Not on file  Occupational History  . Not on file  Tobacco Use  . Smoking status: Never Smoker  . Smokeless tobacco: Never Used  Vaping Use  . Vaping Use: Never used  Substance and Sexual Activity  . Alcohol use: Yes    Alcohol/week: 5.0 standard drinks    Types: 5 Glasses of wine per week    Comment: glass of wine 3-4 times a week  . Drug use: No  . Sexual activity: Never    Comment: INTERCOURSE AGE 62, SEXUAL PARTNERS LESS THAN 5  Other Topics Concern  . Not on file  Social History Narrative  . Not on file  Social Determinants of Health   Financial Resource Strain: Low Risk   . Difficulty of Paying Living Expenses: Not hard at all  Food Insecurity: No Food Insecurity  . Worried About Charity fundraiser in the Last Year: Never true  . Ran Out of Food in the Last Year: Never true  Transportation Needs: No Transportation Needs  . Lack of Transportation (Medical): No  . Lack of Transportation (Non-Medical): No  Physical Activity: Sufficiently Active  . Days of Exercise per Week: 6 days  . Minutes of Exercise per Session: 40 min  Stress: No Stress Concern Present  . Feeling of Stress : Not at all  Social Connections: Moderately Integrated  . Frequency of Communication with Friends and Family: More than three times a week  . Frequency of Social Gatherings with Friends and Family: Once a week  . Attends Religious Services: More than 4 times per year  . Active Member of Clubs or  Organizations: Yes  . Attends Archivist Meetings: More than 4 times per year  . Marital Status: Divorced  Human resources officer Violence: Not At Risk  . Fear of Current or Ex-Partner: No  . Emotionally Abused: No  . Physically Abused: No  . Sexually Abused: No    Outpatient Medications Prior to Visit  Medication Sig Dispense Refill  . acetaminophen (TYLENOL) 500 MG tablet Take 500 mg by mouth as needed.    [name] alendronate (FOSAMAX) 70 MG tablet TAKE 1 TABLET BY MOUTH ONCE A WEEK WITH  A  FULL  GLASS  OF  WATER  ON  AN  EMPTY  STOMACH 4 tablet 5  . calcium carbonate (OS-CAL) 600 MG TABS Take 300 mg by mouth every other day.    . cetirizine (ZYRTEC) 10 MG tablet Take 10 mg by mouth daily as needed for allergies.     . Cholecalciferol (VITAMIN D-3 PO) Take 2,500 Units by mouth daily.     . fish oil-omega-3 fatty acids 1000 MG capsule Take 1 g by mouth daily.     [name] losartan (COZAAR) 50 MG tablet Take 1 tablet (50 mg total) by mouth daily. 30 tablet 5  . Magnesium 400 MG CAPS Take 1 tablet by mouth daily.     . Melatonin 3 MG TABS Take 3 mg by mouth every other day.    . Multiple Vitamin (MULTIVITAMIN WITH MINERALS) TABS tablet Take 1 tablet by mouth every morning.    . Turmeric 500 MG TABS Take 500 mg by mouth as needed.     No facility-administered medications prior to visit.    Allergies  Allergen Reactions  . Hctz [Hydrochlorothiazide] Other (See Comments)    Fatigue, body aches    ROS Review of Systems  Constitutional: Negative for chills and fever.  Respiratory: Negative for cough and shortness of breath.   Cardiovascular: Negative for chest pain.  Genitourinary: Negative for dysuria.  Neurological: Positive for dizziness and light-headedness.      Objective:    Physical Exam Vitals reviewed.  Constitutional:      Appearance: Normal appearance.  Cardiovascular:     Rate and Rhythm: Normal rate and regular rhythm.  Pulmonary:     Effort: Pulmonary effort is  normal.     Breath sounds: Normal breath sounds.  Musculoskeletal:     Right lower leg: No edema.     Left lower leg: No edema.  Neurological:     Mental Status: She is alert.     BP 120/70 (  BP Location: Left Arm, Cuff Size: Normal)   Pulse 68   Ht 5' 7.25" (1.708 m)   Wt 154 lb (69.9 kg)   SpO2 97%   BMI 23.94 kg/m  Wt Readings from Last 3 Encounters:  12/10/20 154 lb (69.9 kg)  11/18/20 157 lb 8 oz (71.4 kg)  10/21/20 155 lb (70.3 kg)     There are no preventive care reminders to display for this patient.  There are no preventive care reminders to display for this patient.  Lab Results  Component Value Date   TSH 2.25 10/24/2020   Lab Results  Component Value Date   WBC 4.9 10/24/2020   HGB 14.5 10/24/2020   HCT 42.6 10/24/2020   MCV 95.5 10/24/2020   PLT 196.0 10/24/2020   Lab Results  Component Value Date   NA 140 10/24/2020   K 4.0 10/24/2020   CO2 29 10/24/2020   GLUCOSE 102 (H) 10/24/2020   BUN 17 10/24/2020   CREATININE 0.70 10/24/2020   BILITOT 0.9 10/24/2020   ALKPHOS 55 10/24/2020   AST 20 10/24/2020   ALT 15 10/24/2020   PROT 6.4 10/24/2020   ALBUMIN 4.6 10/24/2020   CALCIUM 10.6 (H) 10/24/2020   ANIONGAP 8 03/28/2018   GFR 82.02 10/24/2020   Lab Results  Component Value Date   CHOL 194 10/24/2020   Lab Results  Component Value Date   HDL 67.10 10/24/2020   Lab Results  Component Value Date   LDLCALC 108 (H) 10/24/2020   Lab Results  Component Value Date   TRIG 95.0 10/24/2020   Lab Results  Component Value Date   CHOLHDL 3 10/24/2020   Lab Results  Component Value Date   HGBA1C 5.8 (H) 12/10/2013      Assessment & Plan:   Hypertension improved with recent addition of losartan.  She had some GI symptoms last weekend which we suspect are probably not related to the losartan.  She has had some mild lightheadedness.  Repeat blood pressure left arm seated 130/70 and standing 120/70.  We have encouraged her to stay  well-hydrated and continue losartan for now.  If she continues to have lightheadedness consider reducing this to half tablet daily.  She has scheduled follow-up in March and reassess then I will recheck basic metabolic panel at that point.  We have also reminded her to get up and change positions slowly when getting up especially at night  No orders of the defined types were placed in this encounter.   Follow-up: No follow-ups on file.    Carolann Littler, MD

## 2020-12-25 ENCOUNTER — Other Ambulatory Visit: Payer: Self-pay

## 2020-12-26 ENCOUNTER — Ambulatory Visit (INDEPENDENT_AMBULATORY_CARE_PROVIDER_SITE_OTHER): Payer: Medicare HMO | Admitting: Family Medicine

## 2020-12-26 ENCOUNTER — Encounter: Payer: Self-pay | Admitting: Family Medicine

## 2020-12-26 VITALS — BP 138/80 | HR 69 | Temp 98.1°F | Ht 67.25 in | Wt 159.0 lb

## 2020-12-26 DIAGNOSIS — I10 Essential (primary) hypertension: Secondary | ICD-10-CM | POA: Diagnosis not present

## 2020-12-26 DIAGNOSIS — R300 Dysuria: Secondary | ICD-10-CM

## 2020-12-26 LAB — POCT URINALYSIS DIPSTICK
Bilirubin, UA: NEGATIVE
Blood, UA: NEGATIVE
Glucose, UA: NEGATIVE
Ketones, UA: NEGATIVE
Nitrite, UA: POSITIVE
Protein, UA: NEGATIVE
Spec Grav, UA: 1.01 (ref 1.010–1.025)
Urobilinogen, UA: 0.2 E.U./dL
pH, UA: 7 (ref 5.0–8.0)

## 2020-12-26 MED ORDER — CEPHALEXIN 500 MG PO CAPS
500.0000 mg | ORAL_CAPSULE | Freq: Three times a day (TID) | ORAL | 0 refills | Status: DC
Start: 2020-12-26 — End: 2020-12-31

## 2020-12-26 NOTE — Patient Instructions (Signed)
Urinary Tract Infection, Adult  A urinary tract infection (UTI) is an infection of any part of the urinary tract. The urinary tract includes the kidneys, ureters, bladder, and urethra. These organs make, store, and get rid of urine in the body. An upper UTI affects the ureters and kidneys. A lower UTI affects the bladder and urethra. What are the causes? Most urinary tract infections are caused by bacteria in your genital area around your urethra, where urine leaves your body. These bacteria grow and cause inflammation of your urinary tract. What increases the risk? You are more likely to develop this condition if:  You have a urinary catheter that stays in place.  You are not able to control when you urinate or have a bowel movement (incontinence).  You are female and you: ? Use a spermicide or diaphragm for birth control. ? Have low estrogen levels. ? Are pregnant.  You have certain genes that increase your risk.  You are sexually active.  You take antibiotic medicines.  You have a condition that causes your flow of urine to slow down, such as: ? An enlarged prostate, if you are female. ? Blockage in your urethra. ? A kidney stone. ? A nerve condition that affects your bladder control (neurogenic bladder). ? Not getting enough to drink, or not urinating often.  You have certain medical conditions, such as: ? Diabetes. ? A weak disease-fighting system (immunesystem). ? Sickle cell disease. ? Gout. ? Spinal cord injury. What are the signs or symptoms? Symptoms of this condition include:  Needing to urinate right away (urgency).  Frequent urination. This may include small amounts of urine each time you urinate.  Pain or burning with urination.  Blood in the urine.  Urine that smells bad or unusual.  Trouble urinating.  Cloudy urine.  Vaginal discharge, if you are female.  Pain in the abdomen or the lower back. You may also have:  Vomiting or a decreased  appetite.  Confusion.  Irritability or tiredness.  A fever or chills.  Diarrhea. The first symptom in older adults may be confusion. In some cases, they may not have any symptoms until the infection has worsened. How is this diagnosed? This condition is diagnosed based on your medical history and a physical exam. You may also have other tests, including:  Urine tests.  Blood tests.  Tests for STIs (sexually transmitted infections). If you have had more than one UTI, a cystoscopy or imaging studies may be done to determine the cause of the infections. How is this treated? Treatment for this condition includes:  Antibiotic medicine.  Over-the-counter medicines to treat discomfort.  Drinking enough water to stay hydrated. If you have frequent infections or have other conditions such as a kidney stone, you may need to see a health care provider who specializes in the urinary tract (urologist). In rare cases, urinary tract infections can cause sepsis. Sepsis is a life-threatening condition that occurs when the body responds to an infection. Sepsis is treated in the hospital with IV antibiotics, fluids, and other medicines. Follow these instructions at home: Medicines  Take over-the-counter and prescription medicines only as told by your health care provider.  If you were prescribed an antibiotic medicine, take it as told by your health care provider. Do not stop using the antibiotic even if you start to feel better. General instructions  Make sure you: ? Empty your bladder often and completely. Do not hold urine for long periods of time. ? Empty your bladder after   sex. ? Wipe from front to back after urinating or having a bowel movement if you are female. Use each tissue only one time when you wipe.  Drink enough fluid to keep your urine pale yellow.  Keep all follow-up visits. This is important.   Contact a health care provider if:  Your symptoms do not get better after 1-2  days.  Your symptoms go away and then return. Get help right away if:  You have severe pain in your back or your lower abdomen.  You have a fever or chills.  You have nausea or vomiting. Summary  A urinary tract infection (UTI) is an infection of any part of the urinary tract, which includes the kidneys, ureters, bladder, and urethra.  Most urinary tract infections are caused by bacteria in your genital area.  Treatment for this condition often includes antibiotic medicines.  If you were prescribed an antibiotic medicine, take it as told by your health care provider. Do not stop using the antibiotic even if you start to feel better.  Keep all follow-up visits. This is important. This information is not intended to replace advice given to you by your health care provider. Make sure you discuss any questions you have with your health care provider. Document Revised: 05/09/2020 Document Reviewed: 05/09/2020 Elsevier Patient Education  2021 Elsevier Inc.  

## 2020-12-26 NOTE — Progress Notes (Signed)
Established Patient Office Visit  Subjective:  Patient ID: Megan Reilly, female    DOB: November 18, 1940  Age: 80 y.o. MRN: [id number]  CC:  Chief Complaint  Patient presents with  . Follow-up    HPI [facility] presents for follow-up regarding her blood pressure.  She had been on HCTZ.  She is having frequent burning with urination and we ended up stopping HCTZ and started losartan.  After starting losartan she states she felt very fatigued and sometimes dizzy with standing.  We did not obtain any major orthostatic change with blood pressure when she was here previously.  Her systolic went from 350-093 standing.  She stopped the losartan on her own about 5 days ago.  She states she feels some better since stopping this.  She is trying to follow a low-sodium diet.  Denies any peripheral edema.  No chest pains.  No headaches.  She does still have some occasional burning with urination.  Some mild urgency.  No fevers or chills.  No flank pain.  No nausea or vomiting.  Past Medical History:  Diagnosis Date  . Allergy   . Arthritis   . Basal cell carcinoma 10/09/2012   right upper arm tx with bx  . BCC (basal cell carcinoma of skin) 12/03/2014   right upper arm bcc nod. tx with bx  . BCC (basal cell carcinoma of skin) 11/04/2015   right upper arm tx exc  . Blood transfusion abn reaction or complication, no procedure mishap    80 years old after tonsilectomy  . Osteoporosis 04/2019   T score -2.1 improved from prior DEXA  . Vasovagal syncope    2 prior incidences last was in 2015 ; treated in ED , reports no recurrence since then     Past Surgical History:  Procedure Laterality Date  . ABDOMINAL HYSTERECTOMY     partial 1983  . COLONOSCOPY    . FOOT SURGERY     bunectomy, hammertoe, bil  . TONSILLECTOMY  1945  . 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 Heart disease Mother   . Hyperlipidemia Mother   . Stroke Mother   . Heart failure Mother   . Diabetes Father        type ll  . Heart disease Father 57       sudden death age 53  . Hyperlipidemia Father   . Breast cancer Maternal Aunt     Social History   Socioeconomic History  . Marital status: Divorced    Spouse name: Not on file  . Number of children: Not on file  . Years of education: Not on file  . Highest education level: Not on file  Occupational History  . Not on file  Tobacco Use  . Smoking status: Never Smoker  . Smokeless tobacco: Never Used  Vaping Use  . Vaping Use: Never used  Substance and Sexual Activity  . Alcohol use: Yes    Alcohol/week: 5.0 standard drinks    Types: 5 Glasses of wine per week    Comment: glass of wine 3-4 times a week  . Drug use: No  . Sexual activity: Never    Comment: INTERCOURSE AGE 54, SEXUAL PARTNERS LESS THAN 5  Other Topics Concern  . Not on file  Social History  Narrative  . Not on file   Social Determinants of Health   Financial Resource Strain: Low Risk   . Difficulty of Paying Living Expenses: Not hard at all  Food Insecurity: No Food Insecurity  . Worried About Charity fundraiser in the Last Year: Never true  . Ran Out of Food in the Last Year: Never true  Transportation Needs: No Transportation Needs  . Lack of Transportation (Medical): No  . Lack of Transportation (Non-Medical): No  Physical Activity: Sufficiently Active  . Days of Exercise per Week: 6 days  . Minutes of Exercise per Session: 40 min  Stress: No Stress Concern Present  . Feeling of Stress : Not at all  Social Connections: Moderately Integrated  . Frequency of Communication with Friends and Family: More than three times a week  . Frequency of Social Gatherings with Friends and Family: Once a week  . Attends Religious Services: More than 4 times per year  . Active  Member of Clubs or Organizations: Yes  . Attends Archivist Meetings: More than 4 times per year  . Marital Status: Divorced  Human resources officer Violence: Not At Risk  . Fear of Current or Ex-Partner: No  . Emotionally Abused: No  . Physically Abused: No  . Sexually Abused: No    Outpatient Medications Prior to Visit  Medication Sig Dispense Refill  . acetaminophen (TYLENOL) 500 MG tablet Take 500 mg by mouth as needed.    [name] alendronate (FOSAMAX) 70 MG tablet TAKE 1 TABLET BY MOUTH ONCE A WEEK WITH  A  FULL  GLASS  OF  WATER  ON  AN  EMPTY  STOMACH 4 tablet 5  . calcium carbonate (OS-CAL) 600 MG TABS Take 300 mg by mouth every other day.    . cetirizine (ZYRTEC) 10 MG tablet Take 10 mg by mouth daily as needed for allergies.     . Cholecalciferol (VITAMIN D-3 PO) Take 2,500 Units by mouth daily.     . fish oil-omega-3 fatty acids 1000 MG capsule Take 1 g by mouth daily.     . Magnesium 400 MG CAPS Take 1 tablet by mouth daily.     . Melatonin 3 MG TABS Take 3 mg by mouth every other day.    . Multiple Vitamin (MULTIVITAMIN WITH MINERALS) TABS tablet Take 1 tablet by mouth every morning.    . Turmeric 500 MG TABS Take 500 mg by mouth as needed.    . hydrochlorothiazide (MICROZIDE) 12.5 MG capsule Take 12.5 mg by mouth daily.    [name] losartan (COZAAR) 50 MG tablet Take 1 tablet (50 mg total) by mouth daily. 30 tablet 5   No facility-administered medications prior to visit.    Allergies  Allergen Reactions  . Hctz [Hydrochlorothiazide] Other (See Comments)    Fatigue, body aches    ROS Review of Systems  Constitutional: Negative for fatigue.  Eyes: Negative for visual disturbance.  Respiratory: Negative for cough, chest tightness, shortness of breath and wheezing.   Cardiovascular: Negative for chest pain, palpitations and leg swelling.  Genitourinary: Positive for dysuria and frequency.  Neurological: Negative for dizziness, seizures, syncope, weakness, light-headedness  and headaches.      Objective:    Physical Exam Constitutional:      Appearance: She is well-developed.  Eyes:     Pupils: Pupils are equal, round, and reactive to light.  Neck:     Thyroid: No thyromegaly.     Vascular: No JVD.  Cardiovascular:  Rate and Rhythm: Normal rate and regular rhythm.     Heart sounds: No gallop.   Pulmonary:     Effort: Pulmonary effort is normal. No respiratory distress.     Breath sounds: Normal breath sounds. No wheezing or rales.  Musculoskeletal:     Cervical back: Neck supple.     Right lower leg: No edema.     Left lower leg: No edema.  Neurological:     Mental Status: She is alert.     BP 138/80   Pulse 69   Temp 98.1 F (36.7 C) (Oral)   Ht 5' 7.25" (1.708 m)   Wt 159 lb (72.1 kg)   SpO2 98%   BMI 24.72 kg/m  Wt Readings from Last 3 Encounters:  12/26/20 159 lb (72.1 kg)  12/10/20 154 lb (69.9 kg)  11/18/20 157 lb 8 oz (71.4 kg)     Health Maintenance Due  Topic Date Due  . COVID-19 Vaccine (4 - Booster for Pfizer series) 12/27/2020    There are no preventive care reminders to display for this patient.  Lab Results  Component Value Date   TSH 2.25 10/24/2020   Lab Results  Component Value Date   WBC 4.9 10/24/2020   HGB 14.5 10/24/2020   HCT 42.6 10/24/2020   MCV 95.5 10/24/2020   PLT 196.0 10/24/2020   Lab Results  Component Value Date   NA 140 10/24/2020   K 4.0 10/24/2020   CO2 29 10/24/2020   GLUCOSE 102 (H) 10/24/2020   BUN 17 10/24/2020   CREATININE 0.70 10/24/2020   BILITOT 0.9 10/24/2020   ALKPHOS 55 10/24/2020   AST 20 10/24/2020   ALT 15 10/24/2020   PROT 6.4 10/24/2020   ALBUMIN 4.6 10/24/2020   CALCIUM 10.6 (H) 10/24/2020   ANIONGAP 8 03/28/2018   GFR 82.02 10/24/2020   Lab Results  Component Value Date   CHOL 194 10/24/2020   Lab Results  Component Value Date   HDL 67.10 10/24/2020   Lab Results  Component Value Date   LDLCALC 108 (H) 10/24/2020   Lab Results  Component  Value Date   TRIG 95.0 10/24/2020   Lab Results  Component Value Date   CHOLHDL 3 10/24/2020   Lab Results  Component Value Date   HGBA1C 5.8 (H) 12/10/2013      Assessment & Plan:   #1 hypertension.  Possible side effect from medication.  We explained side effects like fatigue or not, and with angiotensin receptor blockers but obviously some mild lightheadedness with standing could have been related.  Her blood pressure today seated by me after rest 140/70.  We obtained very similar reading standing. -We agreed to observing off medication at this time. -Will need close monitoring of blood pressure and recommend 3-month follow-up here -Regular aerobic exercise such as walking -Keep sodium intake less than 2400 mg daily  #2 dysuria.  Rule out UTI Urine dipstick shows leukocytes and positive nitrite.  Urine culture sent.  Start Keflex 500 mg 3 times daily for 5 days pending culture results.  No orders of the defined types were placed in this encounter.   Follow-up: No follow-ups on file.    Carolann Littler, MD

## 2020-12-26 NOTE — Addendum Note (Signed)
Addended by: Janann Colonel on: 12/26/2020 09:09 AM   Modules accepted: Orders

## 2020-12-28 LAB — URINE CULTURE
MICRO NUMBER:: 11665031
SPECIMEN QUALITY:: ADEQUATE

## 2020-12-31 ENCOUNTER — Telehealth: Payer: Self-pay | Admitting: Family Medicine

## 2020-12-31 MED ORDER — AMOXICILLIN 500 MG PO CAPS
500.0000 mg | ORAL_CAPSULE | Freq: Three times a day (TID) | ORAL | 0 refills | Status: AC
Start: 2020-12-31 — End: 2021-01-05

## 2020-12-31 NOTE — Addendum Note (Signed)
Addended by: Rodrigo Ran on: 12/31/2020 04:28 PM   Modules accepted: Orders

## 2020-12-31 NOTE — Telephone Encounter (Signed)
The patient has completed her antibiotics and she is still not feeling any better and was wondering if you wanted her to get another refill of this medication or did you want to call in something different for her.   cephALEXin (KEFLEX) 500 MG capsule    [id number] - [place], [place] - [address] Phone:  [phone]  Fax:  [phone]

## 2020-12-31 NOTE — Telephone Encounter (Signed)
Please advise 

## 2020-12-31 NOTE — Telephone Encounter (Signed)
Her culture was susceptible to every antibiotic including Keflex so this should have covered her infection.  Not clear why she is not improving. Okay to send in amoxicillin 500 mg 3 times daily for 5 more days.  If symptoms not fully cleared after that needs office follow-up to repeat culture

## 2020-12-31 NOTE — Telephone Encounter (Signed)
I spoke with pt. She is aware Rx has been sent in - she will let us know if she doesn't improve so we can bring her back in to repeat the culture.

## 2021-01-13 ENCOUNTER — Other Ambulatory Visit: Payer: Self-pay

## 2021-01-14 ENCOUNTER — Ambulatory Visit (INDEPENDENT_AMBULATORY_CARE_PROVIDER_SITE_OTHER): Payer: Medicare HMO | Admitting: Family Medicine

## 2021-01-14 ENCOUNTER — Encounter: Payer: Self-pay | Admitting: Family Medicine

## 2021-01-14 VITALS — BP 130/70 | HR 72 | Temp 98.4°F | Wt 157.7 lb

## 2021-01-14 DIAGNOSIS — M542 Cervicalgia: Secondary | ICD-10-CM

## 2021-01-14 NOTE — Progress Notes (Signed)
Established Patient Office Visit  Subjective:  Patient ID: Megan Reilly, female    DOB: Feb 16, 1941  Age: 80 y.o. MRN: [id number]  CC:  Chief Complaint  Patient presents with  . Neck Pain    R side of neck, x 1 week, gets worse in the afternoon, does feel slightly better today    HPI Megan Reilly presents for right-sided neck pain about a week ago.  Symptoms are actually improved today.  She describes sharp pain which radiates toward the right shoulder.  She tried ibuprofen, heat, BenGay and these seem to provide some relief.  She noted that her pain seemed to be worse late afternoon to evening hours and improved in the morning.  Denies any upper extremity numbness or weakness.  She has had intolerance with muscle relaxers in the past.  Denies any recent injury.  No recent change of pillows.  Symptoms much improved today compared with yesterday and earlier in the week.  Past Medical History:  Diagnosis Date  . Allergy   . Arthritis   . Basal cell carcinoma 10/09/2012   right upper arm tx with bx  . BCC (basal cell carcinoma of skin) 12/03/2014   right upper arm bcc nod. tx with bx  . BCC (basal cell carcinoma of skin) 11/04/2015   right upper arm tx exc  . Blood transfusion abn reaction or complication, no procedure mishap    80 years old after tonsilectomy  . Osteoporosis 04/2019   T score -2.1 improved from prior DEXA  . Vasovagal syncope    2 prior incidences last was in 2015 ; treated in ED , reports no recurrence since then     Past Surgical History:  Procedure Laterality Date  . ABDOMINAL HYSTERECTOMY     partial 1983  . COLONOSCOPY    . FOOT SURGERY     bunectomy, hammertoe, bil  . TONSILLECTOMY  1945  . 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 Heart disease Mother    . Hyperlipidemia Mother   . Stroke Mother   . Heart failure Mother   . Diabetes Father        type ll  . Heart disease Father 41       sudden death age 38  . Hyperlipidemia Father   . Breast cancer Maternal Aunt     Social History   Socioeconomic History  . Marital status: Divorced    Spouse name: Not on file  . Number of children: Not on file  . Years of education: Not on file  . Highest education level: Not on file  Occupational History  . Not on file  Tobacco Use  . Smoking status: Never Smoker  . Smokeless tobacco: Never Used  Vaping Use  . Vaping Use: Never used  Substance and Sexual Activity  . Alcohol use: Yes    Alcohol/week: 5.0 standard drinks    Types: 5 Glasses of wine per week    Comment: glass of wine 3-4 times a week  . Drug use: No  . Sexual activity: Never    Comment: INTERCOURSE AGE 21, SEXUAL PARTNERS LESS THAN 5  Other Topics Concern  . Not on file  Social History Narrative  . Not on file   Social Determinants of Health   Financial Resource Strain:  Low Risk   . Difficulty of Paying Living Expenses: Not hard at all  Food Insecurity: No Food Insecurity  . Worried About Charity fundraiser in the Last Year: Never true  . Ran Out of Food in the Last Year: Never true  Transportation Needs: No Transportation Needs  . Lack of Transportation (Medical): No  . Lack of Transportation (Non-Medical): No  Physical Activity: Sufficiently Active  . Days of Exercise per Week: 6 days  . Minutes of Exercise per Session: 40 min  Stress: No Stress Concern Present  . Feeling of Stress : Not at all  Social Connections: Moderately Integrated  . Frequency of Communication with Friends and Family: More than three times a week  . Frequency of Social Gatherings with Friends and Family: Once a week  . Attends Religious Services: More than 4 times per year  . Active Member of Clubs or Organizations: Yes  . Attends Archivist Meetings: More than 4 times per  year  . Marital Status: Divorced  Human resources officer Violence: Not At Risk  . Fear of Current or Ex-Partner: No  . Emotionally Abused: No  . Physically Abused: No  . Sexually Abused: No    Outpatient Medications Prior to Visit  Medication Sig Dispense Refill  . acetaminophen (TYLENOL) 500 MG tablet Take 500 mg by mouth as needed.    [name] alendronate (FOSAMAX) 70 MG tablet TAKE 1 TABLET BY MOUTH ONCE A WEEK WITH  A  FULL  GLASS  OF  WATER  ON  AN  EMPTY  STOMACH 4 tablet 5  . calcium carbonate (OS-CAL) 600 MG TABS Take 300 mg by mouth every other day.    . cetirizine (ZYRTEC) 10 MG tablet Take 10 mg by mouth daily as needed for allergies.     . Cholecalciferol (VITAMIN D-3 PO) Take 2,500 Units by mouth daily.     . fish oil-omega-3 fatty acids 1000 MG capsule Take 1 g by mouth daily.     . Magnesium 400 MG CAPS Take 1 tablet by mouth daily.     . Melatonin 3 MG TABS Take 3 mg by mouth every other day.    . Multiple Vitamin (MULTIVITAMIN WITH MINERALS) TABS tablet Take 1 tablet by mouth every morning.    . Turmeric 500 MG TABS Take 500 mg by mouth as needed.     No facility-administered medications prior to visit.    Allergies  Allergen Reactions  . Hctz [Hydrochlorothiazide] Other (See Comments)    Fatigue, body aches    ROS Review of Systems  Respiratory: Negative for shortness of breath.   Cardiovascular: Negative for chest pain.  Musculoskeletal: Positive for neck pain.  Neurological: Negative for weakness, numbness and headaches.      Objective:    Physical Exam Vitals reviewed.  Constitutional:      Appearance: Normal appearance.  Neck:     Comments: No cervical spinal tenderness Cardiovascular:     Rate and Rhythm: Normal rate and regular rhythm.  Pulmonary:     Effort: Pulmonary effort is normal.     Breath sounds: Normal breath sounds.  Musculoskeletal:     Cervical back: Neck supple.     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  General muscle atrophy thenar and hypothenar muscles along with some mild interosseous wasting bilaterally  Neurological:     Mental Status: She is alert.     Comments: She has 2+ reflexes throughout  upper extremities bilaterally.  No focal weakness upper extremities     BP 130/70 (BP Location: Left Arm, Patient Position: Sitting, Cuff Size: Normal)   Pulse 72   Temp 98.4 F (36.9 C) (Oral)   Wt 157 lb 11.2 oz (71.5 kg)   SpO2 97%   BMI 24.52 kg/m  Wt Readings from Last 3 Encounters:  01/14/21 157 lb 11.2 oz (71.5 kg)  12/26/20 159 lb (72.1 kg)  12/10/20 154 lb (69.9 kg)     Health Maintenance Due  Topic Date Due  . COVID-19 Vaccine (4 - Booster for Pfizer series) 12/27/2020    There are no preventive care reminders to display for this patient.  Lab Results  Component Value Date   TSH 2.25 10/24/2020   Lab Results  Component Value Date   WBC 4.9 10/24/2020   HGB 14.5 10/24/2020   HCT 42.6 10/24/2020   MCV 95.5 10/24/2020   PLT 196.0 10/24/2020   Lab Results  Component Value Date   NA 140 10/24/2020   K 4.0 10/24/2020   CO2 29 10/24/2020   GLUCOSE 102 (H) 10/24/2020   BUN 17 10/24/2020   CREATININE 0.70 10/24/2020   BILITOT 0.9 10/24/2020   ALKPHOS 55 10/24/2020   AST 20 10/24/2020   ALT 15 10/24/2020   PROT 6.4 10/24/2020   ALBUMIN 4.6 10/24/2020   CALCIUM 10.6 (H) 10/24/2020   ANIONGAP 8 03/28/2018   GFR 82.02 10/24/2020   Lab Results  Component Value Date   CHOL 194 10/24/2020   Lab Results  Component Value Date   HDL 67.10 10/24/2020   Lab Results  Component Value Date   LDLCALC 108 (H) 10/24/2020   Lab Results  Component Value Date   TRIG 95.0 10/24/2020   Lab Results  Component Value Date   CHOLHDL 3 10/24/2020   Lab Results  Component Value Date   HGBA1C 5.8 (H) 12/10/2013      Assessment & Plan:   Right-sided neck pain.  Improved today.  This sounded almost more radicular in nature.  She has not had any upper  extremity numbness or weakness and nonfocal neuro exam.  -Avoid regular use of non-steroidals -Avoid muscle relaxers given her age and risk of falls -Continue conservative treatment with heat and topical sports creams which seemed providing some relief -Follow-up for any recurrent pain or any progressive weakness or numbness  No orders of the defined types were placed in this encounter.   Follow-up: No follow-ups on file.    Carolann Littler, MD

## 2021-01-14 NOTE — Patient Instructions (Signed)
Cervical Radiculopathy  Cervical radiculopathy happens when a nerve in the neck (a cervical nerve) is pinched or bruised. This condition can happen because of an injury to the cervical spine (vertebrae) in the neck, or as part of the normal aging process. Pressure on the cervical nerves can cause pain or numbness that travels from the neck all the way down into the arm and fingers. Usually, this condition gets better with rest. Treatment may be needed if the condition does not improve. What are the causes? This condition may be caused by:  A neck injury.  A bulging (herniated) disk.  Muscle spasms.  Muscle tightness in the neck because of overuse.  Arthritis.  Breakdown or degeneration in the bones and joints of the spine (spondylosis) due to aging.  Bone spurs that may develop near the cervical nerves. What are the signs or symptoms? Symptoms of this condition include:  Pain. The pain may travel from the neck to the arm and hand. The pain can be severe or irritating. It may be worse when you move your neck.  Numbness or tingling in your arm or hand.  Weakness in the affected arm and hand, in severe cases. How is this diagnosed? This condition may be diagnosed based on your symptoms, your medical history, and a physical exam. You may also have tests, including:  X-rays.  A CT scan.  An MRI.  An electromyogram (EMG).  Nerve conduction tests. How is this treated? In many cases, treatment is not needed for this condition. With rest, the condition usually gets better over time. If treatment is needed, options may include:  Wearing a soft neck collar (cervical collar) for short periods of time, as told by your health care provider.  Doing physical therapy to strengthen your neck muscles.  Taking medicines, such as NSAIDs or oral corticosteroids.  Having spinal injections, in severe cases.  Having surgery. This may be needed if other treatments do not help. Different types  of surgery may be done depending on the cause of this condition. Follow these instructions at home: If you have a cervical collar:  Wear it as told by your health care provider. Remove it only as told by your health care provider.  Ask your health care provider if you can remove the collar for cleaning and bathing. If you are allowed to remove the collar for cleaning or bathing: ? Follow instructions from your health care provider about how to remove the collar safely. ? Clean the collar by wiping it with mild soap and water and drying it completely. ? Take out any removable pads in the collar every 1-2 days, and wash them by hand with soap and water. Let them air-dry completely before you put them back in the collar. ? Check your skin under the collar for irritation or sores. If you see any, tell your health care provider. Managing pain  Take over-the-counter and prescription medicines only as told by your health care provider.  If directed, put ice on the affected area. ? If you have a soft neck collar, remove it as told by your health care provider. ? Put ice in a plastic bag. ? Place a towel between your skin and the bag. ? Leave the ice on for 20 minutes, 2-3 times a day.  If applying ice does not help, you can try using heat. Use the heat source that your health care provider recommends, such as a moist heat pack or a heating pad. ? Place a towel   between your skin and the heat source. ? Leave the heat on for 20-30 minutes. ? Remove the heat if your skin turns bright red. This is especially important if you are unable to feel pain, heat, or cold. You may have a greater risk of getting burned.  Try a gentle neck and shoulder massage to help relieve symptoms.      Activity  Rest as needed.  Return to your normal activities as told by your health care provider. Ask your health care provider what activities are safe for you.  Do stretching and strengthening exercises as told by  your health care provider or physical therapist.  Do not lift anything that is heavier than 10 lb (4.5 kg) until your health care provider tells you that it is safe. General instructions  Use a flat pillow when you sleep.  Do not drive while wearing a cervical collar. If you do not have a cervical collar, ask your health care provider if it is safe to drive while your neck heals.  Ask your health care provider if the medicine prescribed to you requires you to avoid driving or using heavy machinery.  Do not use any products that contain nicotine or tobacco, such as cigarettes, e-cigarettes, and chewing tobacco. These can delay healing. If you need help quitting, ask your health care provider.  Keep all follow-up visits as told by your health care provider. This is important. Contact a health care provider if:  Your condition does not improve with treatment. Get help right away if:  Your pain gets much worse and cannot be controlled with medicines.  You have weakness or numbness in your hand, arm, face, or leg.  You have a high fever.  You have a stiff, rigid neck.  You lose control of your bowels or your bladder (have incontinence).  You have trouble with walking, balance, or speaking. Summary  Cervical radiculopathy happens when a nerve in the neck is pinched or bruised.  A nerve can get pinched from a bulging disk, arthritis, muscle spasms, or an injury to the neck.  Symptoms include pain, tingling, or numbness radiating from the neck into the arm or hand. Weakness can also occur in severe cases.  Treatment may include rest, wearing a cervical collar, and physical therapy. Medicines may be prescribed to help with pain. In severe cases, injections or surgery may be needed. This information is not intended to replace advice given to you by your health care provider. Make sure you discuss any questions you have with your health care provider. Document Revised: 08/18/2018  Document Reviewed: 08/18/2018 Elsevier Patient Education  2021 [employer]  

## 2021-02-11 ENCOUNTER — Other Ambulatory Visit: Payer: Self-pay

## 2021-02-11 ENCOUNTER — Encounter: Payer: Self-pay | Admitting: Dermatology

## 2021-02-11 ENCOUNTER — Ambulatory Visit: Payer: Medicare HMO | Admitting: Dermatology

## 2021-02-11 DIAGNOSIS — D692 Other nonthrombocytopenic purpura: Secondary | ICD-10-CM | POA: Diagnosis not present

## 2021-02-11 DIAGNOSIS — Z1283 Encounter for screening for malignant neoplasm of skin: Secondary | ICD-10-CM

## 2021-02-11 DIAGNOSIS — D2239 Melanocytic nevi of other parts of face: Secondary | ICD-10-CM

## 2021-02-11 DIAGNOSIS — L821 Other seborrheic keratosis: Secondary | ICD-10-CM

## 2021-02-11 DIAGNOSIS — D485 Neoplasm of uncertain behavior of skin: Secondary | ICD-10-CM

## 2021-02-11 NOTE — Patient Instructions (Signed)
DERMEND for the bruising on the arms.

## 2021-02-19 ENCOUNTER — Telehealth: Payer: Self-pay | Admitting: Dermatology

## 2021-02-19 NOTE — Telephone Encounter (Signed)
Left message for patient to call for results. 

## 2021-02-19 NOTE — Telephone Encounter (Signed)
Left msg to call.

## 2021-02-19 NOTE — Telephone Encounter (Signed)
Patient is calling again for results.

## 2021-02-19 NOTE — Telephone Encounter (Signed)
Patient is calling for pathology results from last visit with Stuart Tafeen, MD 

## 2021-02-23 ENCOUNTER — Encounter: Payer: Self-pay | Admitting: Dermatology

## 2021-02-23 ENCOUNTER — Telehealth: Payer: Self-pay | Admitting: Dermatology

## 2021-02-23 NOTE — Telephone Encounter (Signed)
Path to patient  

## 2021-02-23 NOTE — Telephone Encounter (Signed)
Patient calling again for results of biopsy. She would like a call this afternoon with her results when she is available.

## 2021-02-23 NOTE — Progress Notes (Signed)
   Follow-Up Visit   Subjective  [place] Winslow Borchard is a 80 y.o. female who presents for the following: Skin Problem (New lesion on nose x months- wont' heal- no itch no bleed, left post neck/shoulder x months- + itch no bleed).  Growth on nose plus several other areas to check Location:  Duration:  Quality:  Associated Signs/Symptoms: Modifying Factors:  Severity:  Timing: Context:   Objective  Well appearing patient in no apparent distress; mood and affect are within normal limits. Objective  Mid Back: Full body skin exam  Objective  Mid Back: Numerous brown textured lesions.  Objective  Left Forearm - Posterior, Right Forearm - Posterior: Half dozen 1 cm ecchymoses on arms.  Denies other history of abnormal bleeding.  Objective  Left Nasal Sidewall: Mixed smooth pink plus crusted 6 mm flat papule, rule out superficial carcinoma       All skin waist up examined.  Areas beneath undergarments not fully examined.   Assessment & Plan    Screening for malignant neoplasm of skin Mid Back  Seborrheic keratosis Mid Back  May leave if stable  Solar purpura ([place]) (2) Left Forearm - Posterior; Right Forearm - Posterior  Will try over the counter dermend.   Neoplasm of uncertain behavior of skin Left Nasal Sidewall  Skin / nail biopsy Type of biopsy: tangential   Informed consent: discussed and consent obtained   Timeout: patient name, date of birth, surgical site, and procedure verified   Anesthesia: the lesion was anesthetized in a standard fashion   Anesthetic:  1% lidocaine w/ epinephrine 1-100,000 local infiltration Instrument used: flexible razor blade   Hemostasis achieved with: ferric subsulfate   Outcome: patient tolerated procedure well   Post-procedure details: wound care instructions given    Specimen 1 - Surgical pathology Differential Diagnosis: scc vs bcc  Check Margins: No      I, Lavonna Monarch, MD, have reviewed all documentation  for this visit.  The documentation on 02/23/21 for the exam, diagnosis, procedures, and orders are all accurate and complete.

## 2021-02-25 ENCOUNTER — Other Ambulatory Visit: Payer: Self-pay

## 2021-02-25 ENCOUNTER — Ambulatory Visit (INDEPENDENT_AMBULATORY_CARE_PROVIDER_SITE_OTHER): Payer: Medicare HMO | Admitting: Family Medicine

## 2021-02-25 ENCOUNTER — Encounter: Payer: Self-pay | Admitting: Family Medicine

## 2021-02-25 VITALS — BP 130/60 | HR 76 | Temp 98.9°F | Wt 156.1 lb

## 2021-02-25 DIAGNOSIS — I10 Essential (primary) hypertension: Secondary | ICD-10-CM | POA: Diagnosis not present

## 2021-02-25 NOTE — Progress Notes (Signed)
Established Patient Office Visit  Subjective:  Patient ID: Megan Reilly, female    DOB: 03-21-1941  Age: 80 y.o. MRN: [id number]  CC:  Chief Complaint  Patient presents with  . Follow-up    Hypertension.    HPI Megan Reilly presents for follow-up blood pressure check.  She does have history of elevated blood pressure in the past.  She felt poorly when taking losartan had some dizziness.  This was discontinued and she is now here for recheck.  She stays fairly active.  She has had some nonspecific fatigue.  She had multiple labs last January including chemistries, CBC, thyroid functions and these were all normal.  Denies any recent chest pains.  No dyspnea.  No headaches.  Not monitoring blood pressures at home.  Past Medical History:  Diagnosis Date  . Allergy   . Arthritis   . Basal cell carcinoma 10/09/2012   right upper arm tx with bx  . BCC (basal cell carcinoma of skin) 12/03/2014   right upper arm bcc nod. tx with bx  . BCC (basal cell carcinoma of skin) 11/04/2015   right upper arm tx exc  . Blood transfusion abn reaction or complication, no procedure mishap    80 years old after tonsilectomy  . Osteoporosis 04/2019   T score -2.1 improved from prior DEXA  . Vasovagal syncope    2 prior incidences last was in 2015 ; treated in ED , reports no recurrence since then     Past Surgical History:  Procedure Laterality Date  . ABDOMINAL HYSTERECTOMY     partial 1983  . COLONOSCOPY    . FOOT SURGERY     bunectomy, hammertoe, bil  . TONSILLECTOMY  1945  . 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 Heart disease Mother   . Hyperlipidemia Mother   . Stroke Mother   . Heart failure Mother   . Diabetes Father        type ll  . Heart disease Father 11       sudden death age  55  . Hyperlipidemia Father   . Breast cancer Maternal Aunt     Social History   Socioeconomic History  . Marital status: Divorced    Spouse name: Not on file  . Number of children: Not on file  . Years of education: Not on file  . Highest education level: Not on file  Occupational History  . Not on file  Tobacco Use  . Smoking status: Never Smoker  . Smokeless tobacco: Never Used  Vaping Use  . Vaping Use: Never used  Substance and Sexual Activity  . Alcohol use: Yes    Alcohol/week: 5.0 standard drinks    Types: 5 Glasses of wine per week    Comment: glass of wine 3-4 times a week  . Drug use: No  . Sexual activity: Never    Comment: INTERCOURSE AGE 55, SEXUAL PARTNERS LESS THAN 5  Other Topics Concern  . Not on file  Social History Narrative  . Not on file   Social Determinants of Health   Financial Resource Strain: Low Risk   . Difficulty of Paying Living Expenses: Not hard at all  Food Insecurity: No Food Insecurity  . Worried About Charity fundraiser in the Last Year:  Never true  . Ran Out of Food in the Last Year: Never true  Transportation Needs: No Transportation Needs  . Lack of Transportation (Medical): No  . Lack of Transportation (Non-Medical): No  Physical Activity: Sufficiently Active  . Days of Exercise per Week: 6 days  . Minutes of Exercise per Session: 40 min  Stress: No Stress Concern Present  . Feeling of Stress : Not at all  Social Connections: Moderately Integrated  . Frequency of Communication with Friends and Family: More than three times a week  . Frequency of Social Gatherings with Friends and Family: Once a week  . Attends Religious Services: More than 4 times per year  . Active Member of Clubs or Organizations: Yes  . Attends Archivist Meetings: More than 4 times per year  . Marital Status: Divorced  Human resources officer Violence: Not At Risk  . Fear of Current or Ex-Partner: No  . Emotionally Abused: No  . Physically  Abused: No  . Sexually Abused: No    Outpatient Medications Prior to Visit  Medication Sig Dispense Refill  . acetaminophen (TYLENOL) 500 MG tablet Take 500 mg by mouth as needed.    [name] alendronate (FOSAMAX) 70 MG tablet TAKE 1 TABLET BY MOUTH ONCE A WEEK WITH  A  FULL  GLASS  OF  WATER  ON  AN  EMPTY  STOMACH 4 tablet 5  . calcium carbonate (OS-CAL) 600 MG TABS Take 300 mg by mouth every other day.    . cetirizine (ZYRTEC) 10 MG tablet Take 10 mg by mouth daily as needed for allergies.     . Cholecalciferol (VITAMIN D-3 PO) Take 2,500 Units by mouth daily.     . fish oil-omega-3 fatty acids 1000 MG capsule Take 1 g by mouth daily.     . Magnesium 400 MG CAPS Take 1 tablet by mouth daily.     . Melatonin 3 MG TABS Take 3 mg by mouth every other day.    . Multiple Vitamin (MULTIVITAMIN WITH MINERALS) TABS tablet Take 1 tablet by mouth every morning.    . Turmeric 500 MG TABS Take 500 mg by mouth as needed.     No facility-administered medications prior to visit.    Allergies  Allergen Reactions  . Hctz [Hydrochlorothiazide] Other (See Comments)    Fatigue, body aches    ROS Review of Systems  Constitutional: Negative for fatigue and unexpected weight change.  Eyes: Negative for visual disturbance.  Respiratory: Negative for cough, chest tightness, shortness of breath and wheezing.   Cardiovascular: Negative for chest pain, palpitations and leg swelling.  Endocrine: Negative for polydipsia and polyuria.  Neurological: Negative for dizziness, seizures, syncope, weakness, light-headedness and headaches.      Objective:    Physical Exam Constitutional:      Appearance: She is well-developed.  Eyes:     Pupils: Pupils are equal, round, and reactive to light.  Neck:     Thyroid: No thyromegaly.     Vascular: No JVD.  Cardiovascular:     Rate and Rhythm: Normal rate and regular rhythm.     Heart sounds: No gallop.   Pulmonary:     Effort: Pulmonary effort is normal. No  respiratory distress.     Breath sounds: Normal breath sounds. No wheezing or rales.  Musculoskeletal:     Cervical back: Neck supple.     Right lower leg: No edema.     Left lower leg: No edema.  Neurological:  Mental Status: She is alert.     BP 130/60 (BP Location: Left Arm, Patient Position: Sitting, Cuff Size: Normal)   Pulse 76   Temp 98.9 F (37.2 C) (Oral)   Wt 156 lb 1.6 oz (70.8 kg)   SpO2 97%   BMI 24.27 kg/m  Wt Readings from Last 3 Encounters:  02/25/21 156 lb 1.6 oz (70.8 kg)  01/14/21 157 lb 11.2 oz (71.5 kg)  12/26/20 159 lb (72.1 kg)     Health Maintenance Due  Topic Date Due  . TETANUS/TDAP  02/01/2021    There are no preventive care reminders to display for this patient.  Lab Results  Component Value Date   TSH 2.25 10/24/2020   Lab Results  Component Value Date   WBC 4.9 10/24/2020   HGB 14.5 10/24/2020   HCT 42.6 10/24/2020   MCV 95.5 10/24/2020   PLT 196.0 10/24/2020   Lab Results  Component Value Date   NA 140 10/24/2020   K 4.0 10/24/2020   CO2 29 10/24/2020   GLUCOSE 102 (H) 10/24/2020   BUN 17 10/24/2020   CREATININE 0.70 10/24/2020   BILITOT 0.9 10/24/2020   ALKPHOS 55 10/24/2020   AST 20 10/24/2020   ALT 15 10/24/2020   PROT 6.4 10/24/2020   ALBUMIN 4.6 10/24/2020   CALCIUM 10.6 (H) 10/24/2020   ANIONGAP 8 03/28/2018   GFR 82.02 10/24/2020   Lab Results  Component Value Date   CHOL 194 10/24/2020   Lab Results  Component Value Date   HDL 67.10 10/24/2020   Lab Results  Component Value Date   LDLCALC 108 (H) 10/24/2020   Lab Results  Component Value Date   TRIG 95.0 10/24/2020   Lab Results  Component Value Date   CHOLHDL 3 10/24/2020   Lab Results  Component Value Date   HGBA1C 5.8 (H) 12/10/2013      Assessment & Plan:   History of hypertension currently stable off medication.  -Repeat blood pressure left arm seated after rest 132/68.  Continue close monitoring and be in touch for blood  pressure greater than 140/90.  -Try to keep sodium intake less than 2400 mg daily.  Office follow-up 5 to 6 months to recheck.  No orders of the defined types were placed in this encounter.   Follow-up: Return in about 5 months (around 07/28/2021).    Carolann Littler, MD

## 2021-02-25 NOTE — Patient Instructions (Signed)
Consider multivitamin with B12.   Let' plan on 5-6 month follow up.

## 2021-03-02 ENCOUNTER — Other Ambulatory Visit: Payer: Self-pay | Admitting: Family Medicine

## 2021-03-02 DIAGNOSIS — Z1231 Encounter for screening mammogram for malignant neoplasm of breast: Secondary | ICD-10-CM

## 2021-03-10 ENCOUNTER — Other Ambulatory Visit: Payer: Self-pay

## 2021-03-10 DIAGNOSIS — M8580 Other specified disorders of bone density and structure, unspecified site: Secondary | ICD-10-CM

## 2021-03-10 DIAGNOSIS — M818 Other osteoporosis without current pathological fracture: Secondary | ICD-10-CM

## 2021-04-10 DIAGNOSIS — H401232 Low-tension glaucoma, bilateral, moderate stage: Secondary | ICD-10-CM | POA: Diagnosis not present

## 2021-04-21 ENCOUNTER — Other Ambulatory Visit: Payer: Self-pay

## 2021-04-21 ENCOUNTER — Other Ambulatory Visit: Payer: Self-pay | Admitting: Obstetrics & Gynecology

## 2021-04-21 ENCOUNTER — Ambulatory Visit (INDEPENDENT_AMBULATORY_CARE_PROVIDER_SITE_OTHER): Payer: Medicare HMO

## 2021-04-21 ENCOUNTER — Telehealth: Payer: Self-pay | Admitting: Family Medicine

## 2021-04-21 DIAGNOSIS — Z780 Asymptomatic menopausal state: Secondary | ICD-10-CM

## 2021-04-21 DIAGNOSIS — M818 Other osteoporosis without current pathological fracture: Secondary | ICD-10-CM

## 2021-04-21 DIAGNOSIS — M810 Age-related osteoporosis without current pathological fracture: Secondary | ICD-10-CM

## 2021-04-21 DIAGNOSIS — M8580 Other specified disorders of bone density and structure, unspecified site: Secondary | ICD-10-CM

## 2021-04-21 MED ORDER — FLUTICASONE PROPIONATE 50 MCG/ACT NA SUSP: 2.0000 | Freq: Every day | NASAL | 6 refills | Status: DC

## 2021-04-21 NOTE — Telephone Encounter (Signed)
Pt call and stated she want dr.Burchette to sent her a RX for Flonase to  [id number] - [place], [place] - [address] Phone:  [phone]  Fax:  [phone]

## 2021-04-21 NOTE — Telephone Encounter (Signed)
Please advise. Flonase is not on the patients med list. Ok for a new Rx?

## 2021-04-21 NOTE — Telephone Encounter (Signed)
Per Dr. Elease Hashimoto "Okay to send in Flonase 1 to 2 sprays per nostril once daily with as needed refills."  Rx sent in. Patient has been made aware.

## 2021-04-24 ENCOUNTER — Ambulatory Visit (INDEPENDENT_AMBULATORY_CARE_PROVIDER_SITE_OTHER): Payer: Medicare HMO | Admitting: Family Medicine

## 2021-04-24 ENCOUNTER — Encounter: Payer: Self-pay | Admitting: Family Medicine

## 2021-04-24 ENCOUNTER — Other Ambulatory Visit: Payer: Self-pay

## 2021-04-24 VITALS — BP 140/70 | HR 66 | Temp 99.1°F | Wt 156.9 lb

## 2021-04-24 DIAGNOSIS — R300 Dysuria: Secondary | ICD-10-CM | POA: Diagnosis not present

## 2021-04-24 DIAGNOSIS — J302 Other seasonal allergic rhinitis: Secondary | ICD-10-CM

## 2021-04-24 LAB — POCT URINALYSIS DIPSTICK
Bilirubin, UA: NEGATIVE
Blood, UA: NEGATIVE
Glucose, UA: NEGATIVE
Ketones, UA: NEGATIVE
Nitrite, UA: NEGATIVE
Protein, UA: NEGATIVE
Spec Grav, UA: 1.015 (ref 1.010–1.025)
Urobilinogen, UA: 0.2 E.U./dL
pH, UA: 6 (ref 5.0–8.0)

## 2021-04-24 MED ORDER — LEVOCETIRIZINE DIHYDROCHLORIDE 5 MG PO TABS: 5.0000 mg | ORAL_TABLET | Freq: Every evening | ORAL | 11 refills | Status: DC

## 2021-04-24 MED ORDER — MONTELUKAST SODIUM 10 MG PO TABS: 10.0000 mg | ORAL_TABLET | Freq: Every day | ORAL | 1 refills | Status: DC

## 2021-04-24 MED ORDER — CEPHALEXIN 500 MG PO CAPS: 500.0000 mg | ORAL_CAPSULE | Freq: Three times a day (TID) | ORAL | 0 refills | Status: DC

## 2021-04-24 NOTE — Progress Notes (Signed)
Established Patient Office Visit  Subjective:  Patient ID: Megan Reilly, female    DOB: 1940-10-21  Age: 80 y.o. MRN: [id number]  CC:  Chief Complaint  Patient presents with   Urinary Tract Infection    X 1 week, urinary pressure, dysuria, frequency    HPI Megan Reilly presents for the following issues  She has new problem of some urinary frequency and suprapubic pressure with mild burning with urination for the past week.  She had very similar symptoms back in March and was treated for E. coli UTI and symptoms did fully clear.  No gross hematuria.  No flank pain.  No nausea or vomiting.  No fever.  No chills.  Drinking fluids well.  UTI back in March was not resistant to any antibiotics.  She was treated with Keflex and then tolerated well.  Other issue is exacerbation of allergies.  She has seasonal allergies usually in the summer and she thinks this is grass allergy.  She is currently taking Zyrtec as well as Flonase but still has frequent postnasal drainage and nasal congestion and some cough which she thinks is related to the postnasal drip.  No purulent secretions.  No facial pain.  No dyspnea  Past Medical History:  Diagnosis Date   Allergy    Arthritis    Basal cell carcinoma 10/09/2012   right upper arm tx with bx   BCC (basal cell carcinoma of skin) 12/03/2014   right upper arm bcc nod. tx with bx   BCC (basal cell carcinoma of skin) 11/04/2015   right upper arm tx exc   Blood transfusion abn reaction or complication, no procedure mishap    80 years old after tonsilectomy   Osteoporosis 04/2019   T score -2.1 improved from prior DEXA   Vasovagal syncope    2 prior incidences last was in 2015 ; treated in ED , reports no recurrence since then     Past Surgical History:  Procedure Laterality Date   ABDOMINAL HYSTERECTOMY     partial 1983   COLONOSCOPY     FOOT SURGERY     bunectomy, hammertoe, bil   TONSILLECTOMY  1945   TOTAL KNEE ARTHROPLASTY Right  03/27/2018   Procedure: RIGHT TOTAL KNEE ARTHROPLASTY;  Surgeon: Gaynelle Arabian, MD;  Location: [facility];  Service: Orthopedics;  Laterality: Right;    Family History  Problem Relation Age of Onset   Heart disease Mother    Hyperlipidemia Mother    Stroke Mother    Heart failure Mother    Diabetes Father        type ll   Heart disease Father 92       sudden death age 27   Hyperlipidemia Father    Breast cancer Maternal Aunt     Social History   Socioeconomic History   Marital status: Divorced    Spouse name: Not on file   Number of children: Not on file   Years of education: Not on file   Highest education level: Not on file  Occupational History   Not on file  Tobacco Use   Smoking status: Never   Smokeless tobacco: Never  Vaping Use   Vaping Use: Never used  Substance and Sexual Activity   Alcohol use: Yes    Alcohol/week: 5.0 standard drinks    Types: 5 Glasses of wine per week    Comment: glass of wine 3-4 times a week   Drug use: No   Sexual  activity: Never    Comment: INTERCOURSE AGE 34, SEXUAL PARTNERS LESS THAN 5  Other Topics Concern   Not on file  Social History Narrative   Not on file   Social Determinants of Health   Financial Resource Strain: Low Risk    Difficulty of Paying Living Expenses: Not hard at all  Food Insecurity: No Food Insecurity   Worried About Charity fundraiser in the Last Year: Never true   [place] in the Last Year: Never true  Transportation Needs: No Transportation Needs   Lack of Transportation (Medical): No   Lack of Transportation (Non-Medical): No  Physical Activity: Sufficiently Active   Days of Exercise per Week: 6 days   Minutes of Exercise per Session: 40 min  Stress: No Stress Concern Present   Feeling of Stress : Not at all  Social Connections: Moderately Integrated   Frequency of Communication with Friends and Family: More than three times a week   Frequency of Social Gatherings with Friends and Family:  Once a week   Attends Religious Services: More than 4 times per year   Active Member of [employer] or Organizations: Yes   Attends Music therapist: More than 4 times per year   Marital Status: Divorced  Human resources officer Violence: Not At Risk   Fear of Current or Ex-Partner: No   Emotionally Abused: No   Physically Abused: No   Sexually Abused: No    Outpatient Medications Prior to Visit  Medication Sig Dispense Refill   acetaminophen (TYLENOL) 500 MG tablet Take 500 mg by mouth as needed.     alendronate (FOSAMAX) 70 MG tablet TAKE 1 TABLET BY MOUTH ONCE A WEEK WITH  A  FULL  GLASS  OF  WATER  ON  AN  EMPTY  STOMACH 4 tablet 5   calcium carbonate (OS-CAL) 600 MG TABS Take 300 mg by mouth every other day.     cetirizine (ZYRTEC) 10 MG tablet Take 10 mg by mouth daily as needed for allergies.      Cholecalciferol (VITAMIN D-3 PO) Take 2,500 Units by mouth daily.      fish oil-omega-3 fatty acids 1000 MG capsule Take 1 g by mouth daily.      fluticasone (FLONASE) 50 MCG/ACT nasal spray Place 2 sprays into both nostrils daily. Place 1 to 2 sparys in each nostril daily. 16 g 6   Magnesium 400 MG CAPS Take 1 tablet by mouth daily.      Melatonin 3 MG TABS Take 3 mg by mouth every other day.     Multiple Vitamin (MULTIVITAMIN WITH MINERALS) TABS tablet Take 1 tablet by mouth every morning.     Turmeric 500 MG TABS Take 500 mg by mouth as needed.     No facility-administered medications prior to visit.    Allergies  Allergen Reactions   Hctz [Hydrochlorothiazide] Other (See Comments)    Fatigue, body aches    ROS Review of Systems  Constitutional:  Negative for appetite change, chills and fever.  HENT:  Positive for congestion, postnasal drip, rhinorrhea and sneezing. Negative for sinus pressure and sinus pain.   Respiratory:  Negative for shortness of breath.   Gastrointestinal:  Negative for abdominal pain, constipation, diarrhea, nausea and vomiting.  Genitourinary:   Positive for dysuria and frequency. Negative for flank pain and hematuria.  Musculoskeletal:  Negative for back pain.  Neurological:  Negative for dizziness.     Objective:    Physical Exam  Vitals reviewed.  Constitutional:      Appearance: Normal appearance.  Cardiovascular:     Rate and Rhythm: Normal rate and regular rhythm.     Pulses: Normal pulses.     Heart sounds: Normal heart sounds.  Pulmonary:     Breath sounds: Normal breath sounds. No wheezing or rales.  Musculoskeletal:     Cervical back: Neck supple.  Neurological:     Mental Status: She is alert.    BP 140/70 (BP Location: Left Arm, Patient Position: Sitting, Cuff Size: Normal)   Pulse 66   Temp 99.1 F (37.3 C) (Oral)   Wt 156 lb 14.4 oz (71.2 kg)   SpO2 96%   BMI 24.39 kg/m  Wt Readings from Last 3 Encounters:  04/24/21 156 lb 14.4 oz (71.2 kg)  02/25/21 156 lb 1.6 oz (70.8 kg)  01/14/21 157 lb 11.2 oz (71.5 kg)     Health Maintenance Due  Topic Date Due   TETANUS/TDAP  02/01/2021    There are no preventive care reminders to display for this patient.  Lab Results  Component Value Date   TSH 2.25 10/24/2020   Lab Results  Component Value Date   WBC 4.9 10/24/2020   HGB 14.5 10/24/2020   HCT 42.6 10/24/2020   MCV 95.5 10/24/2020   PLT 196.0 10/24/2020   Lab Results  Component Value Date   NA 140 10/24/2020   K 4.0 10/24/2020   CO2 29 10/24/2020   GLUCOSE 102 (H) 10/24/2020   BUN 17 10/24/2020   CREATININE 0.70 10/24/2020   BILITOT 0.9 10/24/2020   ALKPHOS 55 10/24/2020   AST 20 10/24/2020   ALT 15 10/24/2020   PROT 6.4 10/24/2020   ALBUMIN 4.6 10/24/2020   CALCIUM 10.6 (H) 10/24/2020   ANIONGAP 8 03/28/2018   GFR 82.02 10/24/2020   Lab Results  Component Value Date   CHOL 194 10/24/2020   Lab Results  Component Value Date   HDL 67.10 10/24/2020   Lab Results  Component Value Date   LDLCALC 108 (H) 10/24/2020   Lab Results  Component Value Date   TRIG 95.0  10/24/2020   Lab Results  Component Value Date   CHOLHDL 3 10/24/2020   Lab Results  Component Value Date   HGBA1C 5.8 (H) 12/10/2013      Assessment & Plan:   #1 dysuria.  Suspect uncomplicated urinary tract infection.  Urine dipstick reveals large leukocytes.  No blood or nitrites.  Patient nontoxic in appearance.  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  Recent exacerbation.  Poorly controlled with current medications which include Zyrtec and Flonase  -Recommend trial of Xyzal 5 mg once daily -Continue her usual daily use of Flonase -Add Singulair 10 mg nightly   Meds ordered this encounter  Medications   cephALEXin (KEFLEX) 500 MG capsule    Sig: Take 1 capsule (500 mg total) by mouth 3 (three) times daily.    Dispense:  15 capsule    Refill:  0   montelukast (SINGULAIR) 10 MG tablet    Sig: Take 1 tablet (10 mg total) by mouth at bedtime.    Dispense:  30 tablet    Refill:  1   levocetirizine (XYZAL) 5 MG tablet    Sig: Take 1 tablet (5 mg total) by mouth every evening.  Dispense:  30 tablet    Refill:  11    Follow-up: No follow-ups on file.    Carolann Littler, MD

## 2021-04-26 ENCOUNTER — Encounter: Payer: Self-pay | Admitting: Family Medicine

## 2021-04-26 LAB — URINE CULTURE
MICRO NUMBER:: 12124767
SPECIMEN QUALITY:: ADEQUATE

## 2021-04-27 ENCOUNTER — Other Ambulatory Visit: Payer: Self-pay

## 2021-04-27 ENCOUNTER — Telehealth: Payer: Self-pay | Admitting: Family Medicine

## 2021-04-27 MED ORDER — AMOXICILLIN 500 MG PO CAPS: ORAL_CAPSULE | ORAL | 0 refills | Status: DC

## 2021-04-27 NOTE — Telephone Encounter (Signed)
Patient saw Dr. Elease Hashimoto on Friday.  She states even with the medication that Dr. Elease Hashimoto gave her she said she feels no better than she did on Friday.  She is wondering if there is something else she could take.    Patient is leaving to go out of town at 1:00.  She states if nothing can be called in here before then if someone will call her she can give you the address close to where she will be (she didn't have it with her).

## 2021-04-27 NOTE — Telephone Encounter (Signed)
Spoke with the patient. She is aware of Dr. Burchette's message.  °

## 2021-04-27 NOTE — Telephone Encounter (Signed)
Spoke with the patient. She is aware of medication changes.   She stated that her sinuses and congestion have not gotten any better. She would like to know if Dr. Elease Hashimoto has any further recommendations.

## 2021-04-29 ENCOUNTER — Ambulatory Visit: Payer: Medicare HMO

## 2021-05-08 ENCOUNTER — Other Ambulatory Visit: Payer: Self-pay | Admitting: Family Medicine

## 2021-05-08 DIAGNOSIS — H401232 Low-tension glaucoma, bilateral, moderate stage: Secondary | ICD-10-CM | POA: Diagnosis not present

## 2021-05-14 ENCOUNTER — Ambulatory Visit: Payer: Medicare HMO | Admitting: Obstetrics & Gynecology

## 2021-05-14 ENCOUNTER — Other Ambulatory Visit: Payer: Self-pay

## 2021-05-14 VITALS — BP 150/90 | HR 92 | Resp 20

## 2021-05-14 DIAGNOSIS — M810 Age-related osteoporosis without current pathological fracture: Secondary | ICD-10-CM | POA: Diagnosis not present

## 2021-05-14 DIAGNOSIS — Z96651 Presence of right artificial knee joint: Secondary | ICD-10-CM | POA: Diagnosis not present

## 2021-05-14 NOTE — Progress Notes (Signed)
    Megan Reilly 1941/01/13 [id number]        80 y.o.  [id number]   RP: Counseling on Osteoporosis  HPI: Osteoporosis on Alendronate x 4 years.  Exercises regularly.  Doing Silver Snickers.  Vit D supplements 2500 IU daily and good nutritional Ca++ intake with supplement.  No recent fall.  No current fracture.   OB History  Gravida Para Term Preterm AB Living  '6 5     1 5  '$ SAB IAB Ectopic Multiple Live Births  1            # Outcome Date GA Lbr Len/2nd Weight Sex Delivery Anes PTL Lv  6 SAB           5 Para           4 Para           3 Para           2 Para           1 Para             Past medical history,surgical history, problem list, medications, allergies, family history and social history were all reviewed and documented in the EPIC chart.   Directed ROS with pertinent positives and negatives documented in the history of present illness/assessment and plan.  Exam:  Vitals:   05/14/21 1434  BP: (!) 150/90  Pulse: 92  Resp: 20  SpO2: 96%   General appearance:  Normal  Bone Density 04/21/2021: Osteoporosis with lowest T-Score at -2.8 at the Lt Total Forearm. Normal at the AP Spine and Osteopenia at the hips.    Assessment/Plan:  80 y.o. [id number]   1. Age-related osteoporosis without current pathological fracture Bone density findings 04/21/2021 thoroughly reviewed with patient.  BD showing a significant improvement at the AP Spine and Total Rt Hip.  Stable at the Total Lt Hip.  Osteoporosis only at the Total and 1/3 Lt Forearm.  Normal at the AP Spine and Osteopenia at the hips.  Decision to stop Alendronate.  Repeat a Bone Density in 1 year.  Continue Vit D supplement at 2500 IU daily with good nutritional Ca++ intake and regular weight bearing physical activities.   - DG BONE DENSITY (DXA); Future   Princess Bruins MD, 2:58 PM 05/14/2021

## 2021-05-15 ENCOUNTER — Encounter: Payer: Self-pay | Admitting: Obstetrics & Gynecology

## 2021-05-27 ENCOUNTER — Telehealth: Payer: Self-pay | Admitting: Family Medicine

## 2021-05-27 NOTE — Telephone Encounter (Signed)
Left message for patient to call back and schedule Medicare Annual Wellness Visit (AWV) either virtually or in office.  Left both  my jabber number [phone] and office number    Last AWV 06/26/20  please schedule at anytime with [facility] Nurse Health Advisor 1 or 2   [employer] awv can be calendar year  This should be a 45 minute visit.

## 2021-06-04 ENCOUNTER — Other Ambulatory Visit: Payer: Self-pay

## 2021-06-04 ENCOUNTER — Ambulatory Visit
Admission: RE | Admit: 2021-06-04 | Discharge: 2021-06-04 | Disposition: A | Discharge status: Home / Self Care | Payer: Medicare HMO | Source: Ambulatory Visit | Attending: Family Medicine | Admitting: Family Medicine

## 2021-06-04 DIAGNOSIS — Z1231 Encounter for screening mammogram for malignant neoplasm of breast: Secondary | ICD-10-CM | POA: Diagnosis not present

## 2021-06-09 ENCOUNTER — Ambulatory Visit: Payer: Medicare HMO

## 2021-06-30 ENCOUNTER — Ambulatory Visit (INDEPENDENT_AMBULATORY_CARE_PROVIDER_SITE_OTHER): Payer: Medicare HMO

## 2021-06-30 DIAGNOSIS — Z0000 Encounter for general adult medical examination without abnormal findings: Secondary | ICD-10-CM | POA: Diagnosis not present

## 2021-06-30 NOTE — Progress Notes (Signed)
Subjective:   Chantille Navarrete is a 80 y.o. female who presents for Medicare Annual (Subsequent) preventive examination.  Patient did want to connect to my chart . I connected with [name] today by telephone and verified that I am speaking with the correct person using two identifiers. Location patient: home Location provider: work Persons participating in the virtual visit: patient, provider.   I discussed the limitations, risks, security and privacy concerns of performing an evaluation and management service by telephone and the availability of in person appointments. I also discussed with the patient that there may be a patient responsible charge related to this service. The patient expressed understanding and verbally consented to this telephonic visit.    Interactive audio and video telecommunications were attempted between this provider and patient, however failed, due to patient having technical difficulties OR patient did not have access to video capability.  We continued and completed visit with audio only.    Review of Systems    N/A Cardiac Risk Factors include: advanced age (>38men, >68 women);dyslipidemia     Objective:    Today's Vitals   There is no height or weight on file to calculate BMI.  Advanced Directives 06/30/2021 06/26/2020 03/27/2018 03/21/2018 10/12/2017 03/24/2015 02/04/2015  Does Patient Have a Medical Advance Directive? Yes Yes Yes Yes Yes Yes Yes  Type of Paramedic of [place];Living will [place];Living will Living will Living will - [place];Living will [place];Living will  Does patient want to make changes to medical advance directive? - No - Patient declined No - Patient declined No - Patient declined - - No - Patient declined  Copy of [place] in Chart? No - copy requested Yes - validated most recent copy scanned in chart (See row information) -  - - - No - copy requested  Would patient like information on creating a medical advance directive? - - - - - - No - patient declined information  Pre-existing out of facility DNR order (yellow form or pink MOST form) - - - - - - -    Current Medications (verified) Outpatient Encounter Medications as of 06/30/2021  Medication Sig   acetaminophen (TYLENOL) 500 MG tablet Take 500 mg by mouth as needed.   calcium carbonate (OS-CAL) 600 MG TABS Take 300 mg by mouth every other day.   cetirizine (ZYRTEC) 10 MG tablet Take 10 mg by mouth daily as needed for allergies.   Cholecalciferol (VITAMIN D-3 PO) Take 2,500 Units by mouth daily.    fish oil-omega-3 fatty acids 1000 MG capsule Take 1 g by mouth daily.    fluticasone (FLONASE) 50 MCG/ACT nasal spray Place 2 sprays into both nostrils daily. Place 1 to 2 sparys in each nostril daily.   levocetirizine (XYZAL) 5 MG tablet Take 1 tablet (5 mg total) by mouth every evening.   Magnesium 400 MG CAPS Take 1 tablet by mouth daily.    Melatonin 3 MG TABS Take 3 mg by mouth every other day.   montelukast (SINGULAIR) 10 MG tablet Take 1 tablet (10 mg total) by mouth at bedtime.   Multiple Vitamin (MULTIVITAMIN WITH MINERALS) TABS tablet Take 1 tablet by mouth every morning.   Turmeric 500 MG TABS Take 500 mg by mouth as needed.   amoxicillin (AMOXIL) 500 MG capsule Take 3 capsules by mouth daily, for 5 days (Patient not taking: No sig reported)   cephALEXin (KEFLEX) 500 MG capsule Take  1 capsule (500 mg total) by mouth 3 (three) times daily. (Patient not taking: No sig reported)   No facility-administered encounter medications on file as of 06/30/2021.    Allergies (verified) Hctz [hydrochlorothiazide]   History: Past Medical History:  Diagnosis Date   Allergy    Arthritis    Basal cell carcinoma 10/09/2012   right upper arm tx with bx   BCC (basal cell carcinoma of skin) 12/03/2014   right upper arm bcc nod. 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   Procedure: RIGHT TOTAL KNEE ARTHROPLASTY;  Surgeon: Gaynelle Arabian, MD;  Location: [facility];  Service: Orthopedics;  Laterality: Right;   Family History  Problem Relation Age of Onset   Heart disease Mother    Hyperlipidemia Mother    Stroke Mother    Heart failure Mother    Diabetes Father        type ll   Heart disease Father 60       sudden death age 33   Hyperlipidemia Father    Breast cancer Maternal Aunt    Social History   Socioeconomic History   Marital status: Divorced    Spouse name: Not on file   Number of children: Not on file   Years of education: Not on file   Highest education level: Not on file  Occupational History   Not on file  Tobacco Use   Smoking status: Never   Smokeless tobacco: Never  Vaping Use   Vaping Use: Never used  Substance and Sexual Activity   Alcohol use: Yes    Alcohol/week: 5.0 standard drinks    Types: 5 Glasses of wine per week    Comment: glass of wine 3-4 times a week   Drug use: No   Sexual activity: Never    Comment: INTERCOURSE AGE 68, SEXUAL PARTNERS LESS THAN 5  Other Topics Concern   Not on file  Social History Narrative   Not on file   Social Determinants of Health   Financial Resource Strain: Low Risk    Difficulty of Paying Living Expenses: Not hard at all  Food Insecurity: No Food Insecurity   Worried About Charity fundraiser in the Last Year: Never true    Ran Out of Food in the Last Year: Never true  Transportation Needs: No Transportation Needs   Lack of Transportation (Medical): No   Lack of Transportation (Non-Medical): No  Physical Activity: Sufficiently Active   Days of Exercise per Week: 3 days   Minutes of Exercise per Session: 50 min  Stress: No Stress Concern Present   Feeling of Stress : Not at all  Social Connections: Moderately Integrated   Frequency of Communication with Friends and Family: Three times a week   Frequency of Social Gatherings with Friends and Family: Three times  a week   Attends Religious Services: More than 4 times per year   Active Member of Clubs or Organizations: Yes   Attends Music therapist: More than 4 times per year   Marital Status: Divorced    Tobacco Counseling Counseling given: Not Answered   Clinical Intake:  Pre-visit preparation completed: Yes  Pain : No/denies pain     Nutritional Risks: None Diabetes: No  How often do you need to have someone help you when you read instructions, pamphlets, or other written materials from your doctor or pharmacy?: 1 - Never What is the last grade level you completed in school?: college  Diabetic?no  Interpreter Needed?: No  Information entered by :: [place] of Daily Living In your present state of health, do you have any difficulty performing the following activities: 06/30/2021  Hearing? N  Vision? N  Difficulty concentrating or making decisions? N  Walking or climbing stairs? N  Dressing or bathing? N  Doing errands, shopping? N  Preparing Food and eating ? N  Using the Toilet? N  In the past six months, have you accidently leaked urine? N  Do you have problems with loss of bowel control? N  Managing your Medications? N  Managing your Finances? N  Housekeeping or managing your Housekeeping? N  Some recent data might be hidden    Patient Care Team: Eulas Post, MD as PCP - General (Family  Medicine) Lavonna Monarch, MD as Consulting Physician (Dermatology)  Indicate any recent Medical Services you may have received from other than [facility] providers in the past year (date may be approximate).     Assessment:   This is a routine wellness examination for [place].  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 0 0 0 0 0  Number falls in past yr: 0 0 - - -  Injury with Fall? 0 0 - - -  Risk for fall due to : No Fall Risks Medication side effect No Fall Risks - -  Follow up Falls evaluation completed Falls evaluation completed;Falls prevention discussed - - -    FALL RISK PREVENTION PERTAINING TO THE HOME:  Any stairs in or around the home? No  If so, are there any without handrails? Yes  Home free of loose throw rugs in walkways, pet beds, electrical cords, etc? Yes  Adequate lighting in your home to reduce risk of falls? No   ASSISTIVE DEVICES UTILIZED TO PREVENT FALLS:  Life alert? No  Use of a cane, walker or w/c? No  Grab bars in the bathroom? Yes  Shower chair or bench in shower? No  Elevated toilet seat or a handicapped toilet? No  Cognitive Function:  Normal cognitive status assessed by direct observation by this Nurse Health Advisor. No abnormalities found.         Immunizations Immunization History  Administered Date(s) Administered   Fluad Quad(high Dose 65+) 07/17/2019, 06/24/2020   Influenza Split 07/22/2011, 08/09/2012   Influenza, High Dose Seasonal PF 07/24/2015, 07/15/2016, 07/04/2017, 07/03/2018, 07/03/2019   Influenza,inj,Quad PF,6+ Mos 06/26/2013, 07/15/2014   Influenza-Unspecified 07/03/2018   PFIZER Comirnaty(Gray Top)Covid-19 Tri-Sucrose Vaccine 01/16/2021   PFIZER(Purple Top)SARS-COV-2 Vaccination 10/23/2019, 11/12/2019, 06/29/2020   PPD Test 01/19/2013   Pneumococcal Conjugate-13 10/12/2007, 09/18/2013   Pneumococcal Polysaccharide-23 09/29/2016   Tdap 02/02/2011   Zoster Recombinat (Shingrix) 12/19/2017, 05/31/2018   Zoster, Live 07/11/2012    TDAP status: Up to date  Flu Vaccine status: Up to date  Pneumococcal vaccine status: Up to date  Covid-19 vaccine status: Completed vaccines  Qualifies for Shingles Vaccine? Yes   Zostavax completed Yes   Shingrix Completed?: Yes  Screening Tests Health Maintenance  Topic Date Due   TETANUS/TDAP  02/01/2021   INFLUENZA VACCINE  05/11/2021   COVID-19 Vaccine (5 - Booster for Pfizer series) 05/18/2021   DEXA SCAN  Completed   Zoster Vaccines- Shingrix  Completed   HPV VACCINES  Aged Out    Health Maintenance  Health Maintenance Due  Topic Date Due   TETANUS/TDAP  02/01/2021   INFLUENZA VACCINE  05/11/2021   COVID-19 Vaccine (5 - Booster for Pfizer series) 05/18/2021    Colorectal cancer screening: No longer required.   Mammogram status: No longer required due to age.  Bone Density status: Completed 04/21/2021. Results reflect: Bone density results: OSTEOPENIA. Repeat every 5 years.  Lung Cancer Screening: (Low Dose CT Chest recommended if Age 66-80 years, 30 pack-year currently smoking OR have quit w/in 15years.) does not qualify.   Lung Cancer Screening Referral: n/a  Additional Screening:  Hepatitis C Screening: does not qualify;   Vision Screening:  Recommended annual ophthalmology exams for early detection of glaucoma and other disorders of the eye. Is the patient up to date with their annual eye exam?  Yes  Who is the provider or what is the name of the office in which the patient attends annual eye exams? Dr.Tanner If pt is not established with a provider, would they like to be referred to a provider to establish care? No .   Dental Screening: Recommended annual dental exams for proper oral hygiene  Community Resource Referral / Chronic Care Management: CRR required this visit?  No   CCM required this visit?  No      Plan:     I have personally reviewed and noted the following in the patient's chart:   Medical and social history Use of alcohol, tobacco or illicit drugs  Current medications and supplements including opioid prescriptions.  Functional ability and status Nutritional status Physical activity Advanced directives List of other physicians Hospitalizations, surgeries, and ER visits in previous 12 months Vitals Screenings to include cognitive, depression, and falls Referrals and appointments  In addition, I have reviewed and discussed with patient certain preventive protocols, quality metrics, and best practice recommendations. A written personalized care plan for preventive services as well as general preventive health recommendations were provided to patient.     Randel Pigg, LPN   [id number]   Nurse Notes: none

## 2021-06-30 NOTE — Patient Instructions (Signed)
Megan Reilly , Thank you for taking time to come for your Medicare Wellness Visit. I appreciate your ongoing commitment to your health goals. Please review the following plan we discussed and let me know if I can assist you in the future.   Screening recommendations/referrals: Colonoscopy: no longer required  Mammogram: no longer required  Bone Density: 04/21/2021 Recommended yearly ophthalmology/optometry visit for glaucoma screening and checkup Recommended yearly dental visit for hygiene and checkup  Vaccinations: Influenza vaccine: due in fall 2022  Pneumococcal vaccine: completed series  Tdap vaccine: 02/02/2011 Shingles vaccine: completed series    Advanced directives: will provide copies   Conditions/risks identified: none   Next appointment: 07/28/2021  0815am  With Dr. Elease Hashimoto    Preventive Care 65 Years and Older, Female Preventive care refers to lifestyle choices and visits with your health care provider that can promote health and wellness. What does preventive care include? A yearly physical exam. This is also called an annual well check. Dental exams once or twice a year. Routine eye exams. Ask your health care provider how often you should have your eyes checked. Personal lifestyle choices, including: Daily care of your teeth and gums. Regular physical activity. Eating a healthy diet. Avoiding tobacco and drug use. Limiting alcohol use. Practicing safe sex. Taking low-dose aspirin every day. Taking vitamin and mineral supplements as recommended by your health care provider. What happens during an annual well check? The services and screenings done by your health care provider during your annual well check will depend on your age, overall health, lifestyle risk factors, and family history of disease. Counseling  Your health care provider may ask you questions about your: Alcohol use. Tobacco use. Drug use. Emotional well-being. Home and relationship  well-being. Sexual activity. Eating habits. History of falls. Memory and ability to understand (cognition). Work and work Statistician. Reproductive health. Screening  You may have the following tests or measurements: Height, weight, and BMI. Blood pressure. Lipid and cholesterol levels. These may be checked every 5 years, or more frequently if you are over 63 years old. Skin check. Lung cancer screening. You may have this screening every year starting at age 10 if you have a 30-pack-year history of smoking and currently smoke or have quit within the past 15 years. Fecal occult blood test (FOBT) of the stool. You may have this test every year starting at age 75. Flexible sigmoidoscopy or colonoscopy. You may have a sigmoidoscopy every 5 years or a colonoscopy every 10 years starting at age 32. Hepatitis C blood test. Hepatitis B blood test. Sexually transmitted disease (STD) testing. Diabetes screening. This is done by checking your blood sugar (glucose) after you have not eaten for a while (fasting). You may have this done every 1-3 years. Bone density scan. This is done to screen for osteoporosis. You may have this done starting at age 6. Mammogram. This may be done every 1-2 years. Talk to your health care provider about how often you should have regular mammograms. Talk with your health care provider about your test results, treatment options, and if necessary, the need for more tests. Vaccines  Your health care provider may recommend certain vaccines, such as: Influenza vaccine. This is recommended every year. Tetanus, diphtheria, and acellular pertussis (Tdap, Td) vaccine. You may need a Td booster every 10 years. Zoster vaccine. You may need this after age 20. Pneumococcal 13-valent conjugate (PCV13) vaccine. One dose is recommended after age 36. Pneumococcal polysaccharide (PPSV23) vaccine. One dose is recommended after age  65. Talk to your health care provider about which  screenings and vaccines you need and how often you need them. This information is not intended to replace advice given to you by your health care provider. Make sure you discuss any questions you have with your health care provider. Document Released: 10/24/2015 Document Revised: 06/16/2016 Document Reviewed: 07/29/2015 Elsevier Interactive Patient Education  2017 [place] Prevention in the Home Falls can cause injuries. They can happen to people of all ages. There are many things you can do to make your home safe and to help prevent falls. What can I do on the outside of my home? Regularly fix the edges of walkways and driveways and fix any cracks. Remove anything that might make you trip as you walk through a door, such as a raised step or threshold. Trim any bushes or trees on the path to your home. Use bright outdoor lighting. Clear any walking paths of anything that might make someone trip, such as rocks or tools. Regularly check to see if handrails are loose or broken. Make sure that both sides of any steps have handrails. Any raised decks and porches should have guardrails on the edges. Have any leaves, snow, or ice cleared regularly. Use sand or salt on walking paths during winter. Clean up any spills in your garage right away. This includes oil or grease spills. What can I do in the bathroom? Use night lights. Install grab bars by the toilet and in the tub and shower. Do not use towel bars as grab bars. Use non-skid mats or decals in the tub or shower. If you need to sit down in the shower, use a plastic, non-slip stool. Keep the floor dry. Clean up any water that spills on the floor as soon as it happens. Remove soap buildup in the tub or shower regularly. Attach bath mats securely with double-sided non-slip rug tape. Do not have throw rugs and other things on the floor that can make you trip. What can I do in the bedroom? Use night lights. Make sure that you have a  light by your bed that is easy to reach. Do not use any sheets or blankets that are too big for your bed. They should not hang down onto the floor. Have a firm chair that has side arms. You can use this for support while you get dressed. Do not have throw rugs and other things on the floor that can make you trip. What can I do in the kitchen? Clean up any spills right away. Avoid walking on wet floors. Keep items that you use a lot in easy-to-reach places. If you need to reach something above you, use a strong step stool that has a grab bar. Keep electrical cords out of the way. Do not use floor polish or wax that makes floors slippery. If you must use wax, use non-skid floor wax. Do not have throw rugs and other things on the floor that can make you trip. What can I do with my stairs? Do not leave any items on the stairs. Make sure that there are handrails on both sides of the stairs and use them. Fix handrails that are broken or loose. Make sure that handrails are as long as the stairways. Check any carpeting to make sure that it is firmly attached to the stairs. Fix any carpet that is loose or worn. Avoid having throw rugs at the top or bottom of the stairs. If you do have  throw rugs, attach them to the floor with carpet tape. Make sure that you have a light switch at the top of the stairs and the bottom of the stairs. If you do not have them, ask someone to add them for you. What else can I do to help prevent falls? Wear shoes that: Do not have high heels. Have rubber bottoms. Are comfortable and fit you well. Are closed at the toe. Do not wear sandals. If you use a stepladder: Make sure that it is fully opened. Do not climb a closed stepladder. Make sure that both sides of the stepladder are locked into place. Ask someone to hold it for you, if possible. Clearly mark and make sure that you can see: Any grab bars or handrails. First and last steps. Where the edge of each step  is. Use tools that help you move around (mobility aids) if they are needed. These include: Canes. Walkers. Scooters. Crutches. Turn on the lights when you go into a dark area. Replace any light bulbs as soon as they burn out. Set up your furniture so you have a clear path. Avoid moving your furniture around. If any of your floors are uneven, fix them. If there are any pets around you, be aware of where they are. Review your medicines with your doctor. Some medicines can make you feel dizzy. This can increase your chance of falling. Ask your doctor what other things that you can do to help prevent falls. This information is not intended to replace advice given to you by your health care provider. Make sure you discuss any questions you have with your health care provider. Document Released: 07/24/2009 Document Revised: 03/04/2016 Document Reviewed: 11/01/2014 Elsevier Interactive Patient Education  2017 [employer].

## 2021-07-06 ENCOUNTER — Other Ambulatory Visit: Payer: Self-pay | Admitting: Family Medicine

## 2021-07-07 ENCOUNTER — Ambulatory Visit (INDEPENDENT_AMBULATORY_CARE_PROVIDER_SITE_OTHER): Payer: Medicare HMO | Admitting: Otolaryngology

## 2021-07-07 ENCOUNTER — Other Ambulatory Visit: Payer: Self-pay

## 2021-07-07 DIAGNOSIS — H903 Sensorineural hearing loss, bilateral: Secondary | ICD-10-CM | POA: Diagnosis not present

## 2021-07-07 DIAGNOSIS — H6123 Impacted cerumen, bilateral: Secondary | ICD-10-CM

## 2021-07-07 NOTE — Progress Notes (Signed)
HPI: Megan Reilly is a 80 y.o. female who presents is referred by Colletta Maryland for evaluation of wax buildup in her ears.  She has hearing aids that she got from [facility] and is having difficulty hearing.  She was seen by Colletta Maryland who felt like her ears need to be cleaned..  Past Medical History:  Diagnosis Date   Allergy    Arthritis    Basal cell carcinoma 10/09/2012   right upper arm tx with bx   BCC (basal cell carcinoma of skin) 12/03/2014   right upper arm bcc nod. 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   Procedure: RIGHT TOTAL KNEE ARTHROPLASTY;  Surgeon: Gaynelle Arabian, MD;  Location: [facility];  Service: Orthopedics;  Laterality: Right;   Social History   Socioeconomic History   Marital status: Divorced    Spouse name: Not on file   Number of children: Not on file   Years of education: Not on file   Highest education level: Not on file  Occupational History   Not on file  Tobacco Use   Smoking status: Never   Smokeless tobacco: Never  Vaping Use   Vaping Use: Never used  Substance and Sexual Activity   Alcohol use: Yes    Alcohol/week: 5.0 standard drinks    Types: 5 Glasses of wine per week    Comment: glass of wine 3-4 times a week   Drug use: No   Sexual activity: Never    Comment: INTERCOURSE AGE 84, SEXUAL PARTNERS LESS THAN 5  Other Topics Concern   Not on file  Social History Narrative   Not on file   Social  Determinants of Health   Financial Resource Strain: Low Risk    Difficulty of Paying Living Expenses: Not hard at all  Food Insecurity: No Food Insecurity   Worried About Charity fundraiser in the Last Year: Never true   Ran Out of Food in the Last Year: Never true  Transportation Needs: No Transportation Needs   Lack of Transportation (Medical): No   Lack of Transportation (Non-Medical): No  Physical Activity: Sufficiently Active   Days of Exercise per Week: 3 days   Minutes of Exercise per Session: 50 min  Stress: No Stress Concern Present   Feeling of Stress : Not at all  Social Connections: Moderately Integrated   Frequency of Communication with Friends and Family: Three times a week   Frequency of Social Gatherings with Friends and Family: Three times a week   Attends Religious Services: More than 4 times per year   Active Member of Clubs or Organizations: Yes   Attends Archivist Meetings: More than 4 times per year   Marital Status: Divorced   Family History  Problem Relation Age of Onset   Heart disease Mother  Hyperlipidemia Mother    Stroke Mother    Heart failure Mother    Diabetes Father        type ll   Heart disease Father 79       sudden death age 24   Hyperlipidemia Father    Breast cancer Maternal Aunt    Allergies  Allergen Reactions   Hctz [Hydrochlorothiazide] Other (See Comments)    Fatigue, body aches   Prior to Admission medications   Medication Sig Start Date End Date Taking? Authorizing Provider  acetaminophen (TYLENOL) 500 MG tablet Take 500 mg by mouth as needed.    [provider]  amoxicillin (AMOXIL) 500 MG capsule Take 3 capsules by mouth daily, for 5 days Patient not taking: No sig reported 04/27/21   Eulas Post, MD  calcium carbonate (OS-CAL) 600 MG TABS Take 300 mg by mouth every other day.    [provider]  cephALEXin (KEFLEX) 500 MG capsule Take 1 capsule (500 mg total) by mouth 3 (three) times  daily. Patient not taking: No sig reported 04/24/21   Eulas Post, MD  cetirizine (ZYRTEC) 10 MG tablet Take 10 mg by mouth daily as needed for allergies.    [provider]  Cholecalciferol (VITAMIN D-3 PO) Take 2,500 Units by mouth daily.     [provider]  fish oil-omega-3 fatty acids 1000 MG capsule Take 1 g by mouth daily.     [provider]  fluticasone (FLONASE) 50 MCG/ACT nasal spray Place 2 sprays into both nostrils daily. Place 1 to 2 sparys in each nostril daily. 04/21/21   Burchette, Alinda Sierras, MD  levocetirizine (XYZAL) 5 MG tablet Take 1 tablet (5 mg total) by mouth every evening. 04/24/21   Burchette, Alinda Sierras, MD  Magnesium 400 MG CAPS Take 1 tablet by mouth daily.     [provider]  Melatonin 3 MG TABS Take 3 mg by mouth every other day.    [provider]  montelukast (SINGULAIR) 10 MG tablet TAKE 1 TABLET BY MOUTH EVERYDAY AT BEDTIME 07/06/21   Burchette, Alinda Sierras, MD  Multiple Vitamin (MULTIVITAMIN WITH MINERALS) TABS tablet Take 1 tablet by mouth every morning.    [provider]  Turmeric 500 MG TABS Take 500 mg by mouth as needed.    [provider]     Positive ROS: Otherwise negative  All other systems have been reviewed and were otherwise negative with the exception of those mentioned in the HPI and as above.  Physical Exam: Constitutional: Alert, well-appearing, no acute distress Ears: External ears without lesions or tenderness.  Right ear canal is completely occluded with cerumen that was cleaned with suction.  Left ear canal had moderate cerumen that was cleaned with suction.  Both TMs were clear.  Hearing was improved in the right ear after cleaning the wax. Nasal: External nose without lesions.. Clear nasal passages Oral: Lips and gums without lesions. Tongue and palate mucosa without lesions. Posterior oropharynx clear. Neck: No palpable adenopathy or masses Respiratory: Breathing comfortably   Skin: No facial/neck lesions or rash noted.  Cerumen impaction removal  Date/Time: 07/07/2021 5:44 PM Performed by: Rozetta Nunnery, MD Authorized by: Rozetta Nunnery, MD   Consent:    Consent obtained:  Verbal   Consent given by:  Patient   Risks discussed:  Pain and bleeding Procedure details:    Location:  L ear and R ear   Procedure type: irrigation and suction  Post-procedure details:    Inspection:  TM intact and canal normal   Hearing quality:  Improved   Procedure completion:  Tolerated well, no immediate complications Comments:     She had a large amount of wax in the right ear canal is small amount of wax in the left ear canal that was cleaned with suction and hydrogen peroxide.  Both TMs were clear.  Assessment: Cerumen buildup in both ear canals right side worse than the left. 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  Plan: Ear canals were cleaned in the office today. Patient will follow-up with Colletta Maryland concerning further adjustment of her hearing aids as needed.   Radene Journey, MD   CC:

## 2021-07-19 ENCOUNTER — Other Ambulatory Visit: Payer: Self-pay | Admitting: Family Medicine

## 2021-07-28 ENCOUNTER — Other Ambulatory Visit: Payer: Self-pay

## 2021-07-28 ENCOUNTER — Ambulatory Visit (INDEPENDENT_AMBULATORY_CARE_PROVIDER_SITE_OTHER): Payer: Medicare HMO | Admitting: Family Medicine

## 2021-07-28 VITALS — BP 118/84 | HR 62 | Temp 98.1°F | Wt 160.4 lb

## 2021-07-28 DIAGNOSIS — M8580 Other specified disorders of bone density and structure, unspecified site: Secondary | ICD-10-CM | POA: Diagnosis not present

## 2021-07-28 DIAGNOSIS — R3915 Urgency of urination: Secondary | ICD-10-CM | POA: Diagnosis not present

## 2021-07-28 DIAGNOSIS — J3089 Other allergic rhinitis: Secondary | ICD-10-CM | POA: Diagnosis not present

## 2021-07-28 DIAGNOSIS — R739 Hyperglycemia, unspecified: Secondary | ICD-10-CM | POA: Diagnosis not present

## 2021-07-28 NOTE — Patient Instructions (Signed)
Consider over the counter Xyzal 5 mg once daily for allergies  Consider OTC B12 500 to 1,000 mcg daily.

## 2021-07-28 NOTE — Progress Notes (Signed)
Established Patient Office Visit  Subjective:  Patient ID: Megan Reilly, female    DOB: 23-Jun-1941  Age: 80 y.o. MRN: [id number]  CC:  Chief Complaint  Patient presents with   Follow-up    Annual check up    HPI [name] presents for follow-up for several concerns.  She was initially down for a "annual checkup "however she had her Medicare annual wellness visit recently and also had complete physical here last January with full labs.  We discussed the following issues today  She has concerns regarding blood sugar.  She has had prediabetes range blood sugars in the past around 102-110 range.  No polyuria or polydipsia.  No recent A1c  She has questions regarding B12 supplementation.  Her daughter has raised concern about possible short-term memory issues but patient is not aware of any major issues there.  She has ongoing almost daily allergies.  We had suggested Xyzal last visit but she never started that.  She is using Singulair at night.  Urine urgency.  No burning with urination currently.  Usually gets up twice at night to urinate.  Tries avoid late the use of caffeine.  She has history of osteopenia about recent DEXA scan which was reviewed.  She had been on Fosamax for 3 years and she had asked her GYN to come off.  No recent falls.  She does aerobic exercise 3 times per week for exercise  Past Medical History:  Diagnosis Date   Allergy    Arthritis    Basal cell carcinoma 10/09/2012   right upper arm tx with bx   BCC (basal cell carcinoma of skin) 12/03/2014   right upper arm bcc nod. 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   Procedure: RIGHT TOTAL KNEE ARTHROPLASTY;  Surgeon: Megan Arabian, MD;  Location: [facility];  Service: Orthopedics;  Laterality: Right;    Family History  Problem Relation Age of Onset   Heart disease Mother    Hyperlipidemia Mother    Stroke Mother    Heart failure Mother    Diabetes Father        type ll   Heart disease Father 45       sudden death age 57   Hyperlipidemia Father    Breast cancer Maternal Aunt     Social History   Socioeconomic History   Marital status: Divorced    Spouse name: Not on file   Number of children: Not on file   Years of education: Not on file   Highest education level: Not on file  Occupational History   Not on file  Tobacco Use   Smoking status: Never   Smokeless tobacco: Never  Vaping Use   Vaping Use: Never used  Substance and Sexual Activity   Alcohol use: Yes  Alcohol/week: 5.0 standard drinks    Types: 5 Glasses of wine per week    Comment: glass of wine 3-4 times a week   Drug use: No   Sexual activity: Never    Comment: INTERCOURSE AGE 6, SEXUAL PARTNERS LESS THAN 5  Other Topics Concern   Not on file  Social History Narrative   Not on file   Social Determinants of Health   Financial Resource Strain: Low Risk    Difficulty of Paying Living Expenses: Not hard at all  Food Insecurity: No Food Insecurity   Worried About Charity fundraiser in the Last Year: Never true   Ran Out of Food in the Last Year: Never true  Transportation Needs: No Transportation Needs   Lack of Transportation (Medical): No   Lack of Transportation (Non-Medical): No  Physical Activity: Sufficiently Active   Days of Exercise per Week: 3 days   Minutes of Exercise per Session: 50 min  Stress: No Stress Concern Present   Feeling  of Stress : Not at all  Social Connections: Moderately Integrated   Frequency of Communication with Friends and Family: Three times a week   Frequency of Social Gatherings with Friends and Family: Three times a week   Attends Religious Services: More than 4 times per year   Active Member of Clubs or Organizations: Yes   Attends Music therapist: More than 4 times per year   Marital Status: Divorced  Human resources officer Violence: Not At Risk   Fear of Current or Ex-Partner: No   Emotionally Abused: No   Physically Abused: No   Sexually Abused: No    Outpatient Medications Prior to Visit  Medication Sig Dispense Refill   acetaminophen (TYLENOL) 500 MG tablet Take 500 mg by mouth as needed.     calcium carbonate (OS-CAL) 600 MG TABS Take 300 mg by mouth every other day.     cetirizine (ZYRTEC) 10 MG tablet Take 10 mg by mouth daily as needed for allergies.     Cholecalciferol (VITAMIN D-3 PO) Take 2,500 Units by mouth daily.      fish oil-omega-3 fatty acids 1000 MG capsule Take 1 g by mouth daily.      fluticasone (FLONASE) 50 MCG/ACT nasal spray Place 2 sprays into both nostrils daily. Place 1 to 2 sparys in each nostril daily. 16 g 6   levocetirizine (XYZAL) 5 MG tablet Take 1 tablet (5 mg total) by mouth every evening. 30 tablet 11   Magnesium 400 MG CAPS Take 1 tablet by mouth daily.      Melatonin 3 MG TABS Take 3 mg by mouth every other day.     montelukast (SINGULAIR) 10 MG tablet TAKE 1 TABLET BY MOUTH EVERYDAY AT BEDTIME 90 tablet 1   Multiple Vitamin (MULTIVITAMIN WITH MINERALS) TABS tablet Take 1 tablet by mouth every morning.     Turmeric 500 MG TABS Take 500 mg by mouth as needed.     amoxicillin (AMOXIL) 500 MG capsule Take 3 capsules by mouth daily, for 5 days (Patient not taking: No sig reported) 15 capsule 0   cephALEXin (KEFLEX) 500 MG capsule Take 1 capsule (500 mg total) by mouth 3 (three) times daily. (Patient not taking: No sig reported) 15 capsule 0   No  facility-administered medications prior to visit.    Allergies  Allergen Reactions   Hctz [Hydrochlorothiazide] Other (See Comments)    Fatigue, body aches    ROS Review of Systems  Constitutional:  Negative for appetite change and unexpected weight change.  Respiratory:  Negative for cough and shortness of breath.   Cardiovascular:  Negative for chest pain.  Gastrointestinal:  Negative for abdominal pain.  Endocrine: Negative for polydipsia and polyuria.  Genitourinary:  Positive for urgency. Negative for dysuria.     Objective:    Physical Exam Vitals reviewed.  Cardiovascular:     Rate and Rhythm: Normal rate and regular rhythm.  Pulmonary:     Effort: Pulmonary effort is normal.     Breath sounds: Normal breath sounds.  Musculoskeletal:     Right lower leg: No edema.     Left lower leg: No edema.  Neurological:     General: No focal deficit present.     Mental Status: She is alert and oriented to person, place, and time.     Cranial Nerves: No cranial nerve deficit.    BP 118/84 (BP Location: Left Arm, Patient Position: Sitting, Cuff Size: Normal)   Pulse 62   Temp 98.1 F (36.7 C) (Oral)   Wt 160 lb 6.4 oz (72.8 kg)   SpO2 96%   BMI 24.94 kg/m  Wt Readings from Last 3 Encounters:  07/28/21 160 lb 6.4 oz (72.8 kg)  04/24/21 156 lb 14.4 oz (71.2 kg)  02/25/21 156 lb 1.6 oz (70.8 kg)     Health Maintenance Due  Topic Date Due   TETANUS/TDAP  02/01/2021    There are no preventive care reminders to display for this patient.  Lab Results  Component Value Date   TSH 2.25 10/24/2020   Lab Results  Component Value Date   WBC 4.9 10/24/2020   HGB 14.5 10/24/2020   HCT 42.6 10/24/2020   MCV 95.5 10/24/2020   PLT 196.0 10/24/2020   Lab Results  Component Value Date   NA 140 10/24/2020   K 4.0 10/24/2020   CO2 29 10/24/2020   GLUCOSE 102 (H) 10/24/2020   BUN 17 10/24/2020   CREATININE 0.70 10/24/2020   BILITOT 0.9 10/24/2020   ALKPHOS 55  10/24/2020   AST 20 10/24/2020   ALT 15 10/24/2020   PROT 6.4 10/24/2020   ALBUMIN 4.6 10/24/2020   CALCIUM 10.6 (H) 10/24/2020   ANIONGAP 8 03/28/2018   GFR 82.02 10/24/2020   Lab Results  Component Value Date   CHOL 194 10/24/2020   Lab Results  Component Value Date   HDL 67.10 10/24/2020   Lab Results  Component Value Date   LDLCALC 108 (H) 10/24/2020   Lab Results  Component Value Date   TRIG 95.0 10/24/2020   Lab Results  Component Value Date   CHOLHDL 3 10/24/2020   Lab Results  Component Value Date   HGBA1C 5.8 (H) 12/10/2013      Assessment & Plan:   #1 urinary urgency.  Discussed lifestyle management.  We did discuss potential options such as Myrbetriq but at this point she does not feel this is enough problem to warrant medication  #2 seasonal and perennial allergies. -Suggested again trial of Xyzal over-the-counter 5 mg daily and continue her Singulair.  She is also tried nasal steroids in the past  #3 osteopenia by recent DEXA scan.  Patient recently came off Fosamax.  Continue daily calcium and vitamin D and regular weightbearing exercise  #4 history of prediabetes range blood sugars.  A1c checked today and 5.3%.  Patient was given some reassurance.  No orders of the defined types were placed in this encounter.   Follow-up: Return in  about 6 months (around 01/26/2022).    Carolann Littler, MD

## 2021-08-03 ENCOUNTER — Ambulatory Visit: Payer: Medicare HMO | Admitting: Dermatology

## 2021-08-03 ENCOUNTER — Other Ambulatory Visit: Payer: Self-pay

## 2021-08-03 ENCOUNTER — Encounter: Payer: Self-pay | Admitting: Dermatology

## 2021-08-03 DIAGNOSIS — L570 Actinic keratosis: Secondary | ICD-10-CM

## 2021-08-03 DIAGNOSIS — L821 Other seborrheic keratosis: Secondary | ICD-10-CM

## 2021-08-03 DIAGNOSIS — Z1283 Encounter for screening for malignant neoplasm of skin: Secondary | ICD-10-CM

## 2021-08-16 ENCOUNTER — Encounter: Payer: Self-pay | Admitting: Dermatology

## 2021-08-16 NOTE — Progress Notes (Signed)
   Follow-Up Visit   Subjective  Megan Reilly is a 80 y.o. female who presents for the following: Annual Exam (No new concerns).  General skin examination, few crusts on arms Location:  Duration:  Quality:  Associated Signs/Symptoms: Modifying Factors:  Severity:  Timing: Context:   Objective  Well appearing patient in no apparent distress; mood and affect are within normal limits. Torso - Posterior (Back) Full body skin examination: No atypical pigmented lesions or nonmelanoma skin cancer  Left Dorsal Hand Tan textured flattopped 5 mm papule  Right Upper Arm - Anterior Gritty hornlike 4 mm pink crust    A full examination was performed including scalp, head, eyes, ears, nose, lips, neck, chest, axillae, abdomen, back, buttocks, bilateral upper extremities, bilateral lower extremities, hands, feet, fingers, toes, fingernails, and toenails. All findings within normal limits unless otherwise noted below.  Areas beneath undergarments not fully examined.   Assessment & Plan    Encounter for screening for malignant neoplasm of skin Torso - Posterior (Back)  Annual skin examination.  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  The documentation on 08/16/21 for the exam, diagnosis, procedures, and orders are all accurate and complete.

## 2021-09-09 DIAGNOSIS — H401232 Low-tension glaucoma, bilateral, moderate stage: Secondary | ICD-10-CM | POA: Diagnosis not present

## 2021-09-21 ENCOUNTER — Telehealth: Payer: Self-pay | Admitting: Family Medicine

## 2021-09-21 NOTE — Telephone Encounter (Signed)
Patient calling in with respiratory symptoms: Shortness of breath, chest pain, palpitations or other red words send to Triage  Does the patient have a fever over 100, cough, congestion, sore throat, runny nose, lost of taste/smell (please list symptoms that patient has)? Sinus drainage and dry cough x 2 days  What date did symptoms start? 2 days ago  (If over 5 days ago, pt may be scheduled for in person visit)  Have you tested for Covid in the last 5 days? No   If yes, was it positive []  OR negative [] ? If positive in the last 5 days, please schedule virtual visit now. If negative, schedule for an in person OV with the next available provider if PCP has no openings. Please also let patient know they will be tested again (follow the script below)  "you will have to arrive 20mins prior to your appt time to be Covid tested. Please park in back of office at the [facility] & call [phone] to let the staff know you have arrived. A staff member will meet you at your car to do a rapid covid test. Once the test has resulted you will be notified by phone of your results to determine if appt will remain an in person visit or be converted to a virtual/phone visit. If you arrive less than 65mins before your appt time, your visit will be automatically converted to virtual & any recommended testing will happen AFTER the visit."  Pt has an appt with dr Maudie Mercury on 09-22-2021  THINGS TO REMEMBER  If no availability for virtual visit in office,  please schedule another [place] office  If no availability at another [place] office, please instruct patient that they can schedule an evisit or virtual visit through their mychart account. Visits up to 8pm  patients can be seen in office 5 days after positive COVID test

## 2021-09-22 ENCOUNTER — Telehealth (INDEPENDENT_AMBULATORY_CARE_PROVIDER_SITE_OTHER): Payer: Medicare HMO | Admitting: Family Medicine

## 2021-09-22 DIAGNOSIS — R059 Cough, unspecified: Secondary | ICD-10-CM

## 2021-09-22 DIAGNOSIS — R0981 Nasal congestion: Secondary | ICD-10-CM | POA: Diagnosis not present

## 2021-09-22 MED ORDER — BENZONATATE 100 MG PO CAPS: ORAL_CAPSULE | ORAL | 0 refills | Status: DC

## 2021-09-22 NOTE — Patient Instructions (Addendum)
°  HOME CARE TIPS:  -COVID19 testing information: ForwardDrop.tn  Most pharmacies also offer testing and home test kits. If the Covid19 test is positive and you desire antiviral treatment, please contact a [facility] or schedule a follow up virtual visit through your primary care office or through the [employer].  Other test to treat options: ConnectRV.is?[email or web address]=alert  -I sent the medication(s) we discussed to your pharmacy: Meds ordered this encounter  Medications   benzonatate (TESSALON PERLES) 100 MG capsule    Sig: 1-2 capsule up to twice daily as needed for cough    Dispense:  30 capsule    Refill:  0     -can use tylenol if needed for fevers, aches and pains per instructions  -can use nasal saline a few times per day if you have nasal congestion  -a humidifier at night or steam can sometimes help  -stay hydrated, drink plenty of fluids and eat small healthy meals - avoid dairy  -can take 1000 IU (77mcg) Vit D3 and 100-500 mg of Vit C daily per instructions  -If the Covid test is positive, check out the [facility] website for more information on home care, transmission and treatment for COVID19  -follow up with your doctor in 2-3 days unless improving and feeling better  -stay home while sick, except to seek medical care. If you have COVID19, you will likely be contagious for 7-10 days. Flu or Influenza is likely contagious for about 7 days. Other respiratory viral infections remain contagious for 5-10+ days depending on the virus and many other factors. Wear a good mask that fits snugly (such as N95 or KN95) if around others to reduce the risk of transmission.  It was nice to meet you today, and I really hope you are feeling better soon. I help [place] out with telemedicine visits on Tuesdays and Thursdays and am happy to help if you need a follow up virtual visit on those days. Otherwise, if you have any  concerns or questions following this visit please schedule a follow up visit with your Primary Care doctor or seek care at a local urgent care clinic to avoid delays in care.    Seek in person care or schedule a follow up video visit promptly if your symptoms worsen, new concerns arise or you are not improving with treatment. Call 911 and/or seek emergency care if your symptoms are severe or life threatening.

## 2021-09-22 NOTE — Progress Notes (Signed)
Virtual Visit via Telephone Note  I connected with Megan Reilly on 09/22/21 at 12:00 PM EST by telephone and verified that I am speaking with the correct person using two identifiers.   Obtained verbal consent for phone visit.   Location patient: home,  Location provider: work or home office Participants present for the call: patient, provider Patient did not have a visit with me in the prior 7 days to address this/these issue(s).   History of Present Illness:  Acute telemedicine visit for cough and sinus issues: -Onset: chronic sinus issues but this started about 5 days ago -Symptoms include: nasal congestion, cough, now improving quite a bit but the cough is still there at times and has a dry throat at times -Denies: fevers, body aches, CP, SOB, sinus pain headache, vomiting, diarrhea, inability to eat/drink -Pertinent past medical history: see below -Pertinent medication allergies:  Allergies  Allergen Reactions   Hctz [Hydrochlorothiazide] Other (See Comments)    Fatigue, body aches  -COVID-19 vaccine status:  Immunization History  Administered Date(s) Administered   Fluad Quad(high Dose 65+) 07/17/2019, 06/24/2020   Influenza Split 07/22/2011, 08/09/2012   Influenza, High Dose Seasonal PF 07/24/2015, 07/15/2016, 07/04/2017, 07/03/2018, 07/03/2019, 07/09/2021   Influenza,inj,Quad PF,6+ Mos 06/26/2013, 07/15/2014   Influenza-Unspecified 07/03/2018   PFIZER Comirnaty(Gray Top)Covid-19 Tri-Sucrose Vaccine 01/16/2021   PFIZER(Purple Top)SARS-COV-2 Vaccination 10/23/2019, 11/12/2019, 06/29/2020, 01/16/2021, 07/09/2021   PPD Test 01/19/2013   Pfizer Covid-19 Vaccine Bivalent Booster 19yrs & up 07/10/2021   Pneumococcal Conjugate-13 10/12/2007, 09/18/2013   Pneumococcal Polysaccharide-23 09/29/2016   Tdap 02/02/2011   Zoster Recombinat (Shingrix) 12/19/2017, 05/31/2018   Zoster, Live 07/11/2012   Past Medical History:  Diagnosis Date   Allergy    Arthritis    Basal  cell carcinoma 10/09/2012   right upper arm tx with bx   BCC (basal cell carcinoma of skin) 12/03/2014   right upper arm bcc nod. tx with bx   BCC (basal cell carcinoma of skin) 11/04/2015   right upper arm tx exc   Blood transfusion abn reaction or complication, no procedure mishap    80 years old after tonsilectomy   Osteoporosis 04/2019   T score -2.1 improved from prior DEXA   Vasovagal syncope    2 prior incidences last was in 2015 ; treated in ED , reports no recurrence since then       Observations/Objective: Patient sounds cheerful and well on the phone. I do not appreciate any SOB. Speech and thought processing are grossly intact. Patient reported vitals:  Assessment and Plan:  Nasal congestion  Cough, unspecified type  -we discussed possible serious and likely etiologies, options for evaluation and workup, limitations of telemedicine visit vs in person visit, treatment, treatment risks and precautions. Pt prefers to treat via telemedicine empirically rather than in person at this moment. Query VURI, covid19 vs other. She feels she is doing better and has opted to treat with Tessalon for cough and other home care measures per patient instruction.  Advised to seek prompt virtual visit or in person care if worsening, new symptoms arise, or if is not improving with treatment.   Follow Up Instructions:  I did not refer this patient for an OV with me in the next 24 hours for this/these issue(s).  I discussed the assessment and treatment plan with the patient. The patient was provided an opportunity to ask questions and all were answered. The patient agreed with the plan and demonstrated an understanding of the instructions.   I spent  11 minutes on the date of this visit in the care of this patient. See summary of tasks completed to properly care for this patient in the detailed notes above which also included counseling of above, review of PMH, medications, allergies,  evaluation of the patient and ordering and/or  instructing patient on testing and care options.     Lucretia Kern, DO

## 2021-09-27 ENCOUNTER — Emergency Department (HOSPITAL_BASED_OUTPATIENT_CLINIC_OR_DEPARTMENT_OTHER): Payer: Medicare HMO | Admitting: Radiology

## 2021-09-27 ENCOUNTER — Emergency Department (HOSPITAL_BASED_OUTPATIENT_CLINIC_OR_DEPARTMENT_OTHER): Payer: Medicare HMO

## 2021-09-27 ENCOUNTER — Other Ambulatory Visit: Payer: Self-pay

## 2021-09-27 ENCOUNTER — Emergency Department (HOSPITAL_BASED_OUTPATIENT_CLINIC_OR_DEPARTMENT_OTHER)
Admission: EM | Admit: 2021-09-27 | Discharge: 2021-09-27 | Disposition: A | Discharge status: Home / Self Care | Payer: Medicare HMO | Attending: Emergency Medicine | Admitting: Emergency Medicine

## 2021-09-27 ENCOUNTER — Encounter (HOSPITAL_BASED_OUTPATIENT_CLINIC_OR_DEPARTMENT_OTHER): Payer: Self-pay

## 2021-09-27 DIAGNOSIS — S82145A Nondisplaced bicondylar fracture of left tibia, initial encounter for closed fracture: Secondary | ICD-10-CM | POA: Diagnosis not present

## 2021-09-27 DIAGNOSIS — Z96653 Presence of artificial knee joint, bilateral: Secondary | ICD-10-CM | POA: Diagnosis not present

## 2021-09-27 DIAGNOSIS — Y9222 Religious institution as the place of occurrence of the external cause: Secondary | ICD-10-CM | POA: Diagnosis not present

## 2021-09-27 DIAGNOSIS — S82142A Displaced bicondylar fracture of left tibia, initial encounter for closed fracture: Secondary | ICD-10-CM | POA: Diagnosis not present

## 2021-09-27 DIAGNOSIS — Z86011 Personal history of benign neoplasm of the brain: Secondary | ICD-10-CM | POA: Diagnosis not present

## 2021-09-27 DIAGNOSIS — Z85828 Personal history of other malignant neoplasm of skin: Secondary | ICD-10-CM | POA: Diagnosis not present

## 2021-09-27 DIAGNOSIS — M7989 Other specified soft tissue disorders: Secondary | ICD-10-CM | POA: Diagnosis not present

## 2021-09-27 DIAGNOSIS — I10 Essential (primary) hypertension: Secondary | ICD-10-CM | POA: Insufficient documentation

## 2021-09-27 DIAGNOSIS — S8992XA Unspecified injury of left lower leg, initial encounter: Secondary | ICD-10-CM

## 2021-09-27 DIAGNOSIS — S82115A Nondisplaced fracture of left tibial spine, initial encounter for closed fracture: Secondary | ICD-10-CM | POA: Diagnosis not present

## 2021-09-27 DIAGNOSIS — X500XXA Overexertion from strenuous movement or load, initial encounter: Secondary | ICD-10-CM | POA: Diagnosis not present

## 2021-09-27 DIAGNOSIS — S80912A Unspecified superficial injury of left knee, initial encounter: Secondary | ICD-10-CM | POA: Diagnosis not present

## 2021-09-27 MED ORDER — IBUPROFEN 800 MG PO TABS
800.0000 mg | ORAL_TABLET | Freq: Once | ORAL | Status: AC
  Administered 2021-09-27: 15:00:00 800 mg via ORAL
  Filled 2021-09-27: qty 1

## 2021-09-27 NOTE — ED Notes (Signed)
Ice pack given

## 2021-09-27 NOTE — Discharge Instructions (Addendum)
You were evaluated in the Emergency Department and after careful evaluation, we did not find any emergent condition requiring admission or further testing in the hospital.  Your exam/testing today was concerning for potential patellar tendon rupture.  Your CT of the knee revealed a tibial plateau fracture.  You been placed in a knee immobilizer.  Recommend minimal weightbearing until cleared by orthopedics.  Please follow-up and call orthopedics ([facility]) for repeat evaluation in clinic this week.  Recommend immobilization, rest, ice of the knee, NSAIDs for pain control.  Please return to the Emergency Department if you experience any worsening of your condition.  Thank you for allowing [place] to be a part of your care.

## 2021-09-27 NOTE — ED Notes (Signed)
Crackers/ water given to patient

## 2021-09-27 NOTE — ED Triage Notes (Signed)
Pt reports she was coming down some stairs this AM and twisted her L knee. Pt having difficulty bearing weight on L. Pt with obvious L knee swelling noted in triage.

## 2021-09-27 NOTE — ED Provider Notes (Signed)
Care of the patient assumed at the change of shift. Here for L knee injury, xray was neg awaiting CT.  Physical Exam  BP (!) 164/142 (BP Location: Right Arm)    Pulse 65    Temp 98.2 F (36.8 C) (Oral)    Resp 16    Ht 5\' 8"  (1.727 m)    Wt 68 kg    SpO2 98%    BMI 22.81 kg/m   Physical Exam  ED Course/Procedures     Procedures  MDM  CT shows a nondisplaced tibial plateau fracture. Spoke with Dr. Alvan Dame, on call for [facility], who recommends knee immobilizer, minimal weight bearing and outpatient follow up. Patient is comfortable using her walker to get around.        Truddie Hidden, MD 09/27/21 [phone]

## 2021-09-27 NOTE — ED Provider Notes (Signed)
[facility] EMERGENCY DEPT Provider Note   CSN: [id number] Arrival date & time: 09/27/21  1319     History Chief Complaint  Patient presents with   Knee Injury    Megan Reilly is a 80 y.o. female.  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  The patient states that she was walking down the stairs at church when she missed a step and twisted her left knee.  She has had pain in her left knee with some associated swelling and difficulty bearing weight.  She denies any fall or loss of consciousness, head trauma or neck trauma.  She denies any other complaints other than knee pain.  She states that she normally gets around utilizing a walker.  Past Medical History:  Diagnosis Date   Allergy    Arthritis    Basal cell carcinoma 10/09/2012   right upper arm tx with bx   BCC (basal cell carcinoma of skin) 12/03/2014   right upper arm bcc nod. 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   Hearing loss 10/21/2020   Elevated blood-pressure reading, without diagnosis of hypertension 06/02/2020   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   Stiffness of right knee 03/30/2018   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   Vasovagal syncope 12/19/2013   Headache(784.0) 12/10/2013   Subarachnoid hemorrhage following injury 12/09/2013    Subarachnoid bleed ([place]) 12/09/2013   Loss of height 01/30/2013   Back pain, lumbosacral 01/30/2013   Family history of osteoporosis 01/30/2013   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   Osteoporosis 12/20/2012    Past Surgical History:  Procedure Laterality Date   ABDOMINAL HYSTERECTOMY     partial 1983   COLONOSCOPY     FOOT SURGERY     bunectomy, hammertoe, bil   TONSILLECTOMY  1945   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   Procedure: RIGHT TOTAL KNEE ARTHROPLASTY;  Surgeon: Gaynelle Arabian, MD;  Location: [facility];  Service: Orthopedics;  Laterality: Right;     OB History     Gravida  6   Para  5   Term      Preterm      AB  1   Living  5      SAB  1   IAB      Ectopic      Multiple      Live Births              Family History  Problem Relation Age of Onset   Heart disease Mother    Hyperlipidemia Mother    Stroke Mother    Heart failure Mother    Diabetes Father        type ll   Heart disease Father 72       sudden death age 19  Hyperlipidemia Father    Breast cancer Maternal Aunt     Social History   Tobacco Use   Smoking status: Never   Smokeless tobacco: Never  Vaping Use   Vaping Use: Never used  Substance Use Topics   Alcohol use: Yes    Alcohol/week: 5.0 standard drinks    Types: 5 Glasses of wine per week    Comment: glass of wine 3-4 times a week   Drug use: No    Home Medications Prior to Admission medications   Medication Sig Start Date End Date Taking? Authorizing Provider  acetaminophen (TYLENOL) 500 MG tablet Take 500 mg by mouth as needed.    [provider]  benzonatate (TESSALON PERLES) 100 MG capsule 1-2 capsule up to twice daily as needed for cough 09/22/21   Lucretia Kern, DO  calcium carbonate (OS-CAL) 600 MG TABS Take 300 mg by mouth every other day.    [provider]  cetirizine (ZYRTEC) 10 MG tablet Take 10 mg by mouth daily as needed for allergies.    [provider]   Cholecalciferol (VITAMIN D-3 PO) Take 2,500 Units by mouth daily.     [provider]  fish oil-omega-3 fatty acids 1000 MG capsule Take 1 g by mouth daily.     [provider]  fluticasone (FLONASE) 50 MCG/ACT nasal spray Place 2 sprays into both nostrils daily. Place 1 to 2 sparys in each nostril daily. 04/21/21   Burchette, Alinda Sierras, MD  levocetirizine (XYZAL) 5 MG tablet Take 1 tablet (5 mg total) by mouth every evening. 04/24/21   Burchette, Alinda Sierras, MD  Magnesium 400 MG CAPS Take 1 tablet by mouth daily.     [provider]  Melatonin 3 MG TABS Take 3 mg by mouth every other day.    [provider]  montelukast (SINGULAIR) 10 MG tablet TAKE 1 TABLET BY MOUTH EVERYDAY AT BEDTIME 07/20/21   Burchette, Alinda Sierras, MD  Multiple Vitamin (MULTIVITAMIN WITH MINERALS) TABS tablet Take 1 tablet by mouth every morning.    [provider]  Turmeric 500 MG TABS Take 500 mg by mouth as needed.    [provider]    Allergies    Hctz [hydrochlorothiazide]  Review of Systems   Review of Systems  Musculoskeletal:  Positive for arthralgias and joint swelling. Negative for back pain and neck pain.  Neurological:  Negative for headaches.  All other systems reviewed and are negative.  Physical Exam Updated Vital Signs BP (!) 164/142 (BP Location: Right Arm)    Pulse 65    Temp 98.2 F (36.8 C) (Oral)    Resp 16    Ht 5\' 8"  (1.727 m)    Wt 68 kg    SpO2 98%    BMI 22.81 kg/m   Physical Exam Vitals and nursing note reviewed.  Constitutional:      General: She is not in acute distress.    Appearance: She is well-developed.     Comments: GCS 15, ABC intact  HENT:     Head: Normocephalic and atraumatic.  Eyes:     Extraocular Movements: Extraocular movements intact.     Conjunctiva/sclera: Conjunctivae normal.     Pupils: Pupils are equal, round, and reactive to light.  Neck:     Comments: No midline tenderness to palpation of the cervical spine.   Range of motion intact Cardiovascular:     Rate and Rhythm: Normal rate and regular rhythm.  Heart sounds: No murmur heard. Pulmonary:     Effort: Pulmonary effort is normal. No respiratory distress.     Breath sounds: Normal breath sounds.  Chest:     Comments: Clavicles stable nontender to AP compression.  Chest wall stable and nontender to AP and lateral compression. Abdominal:     Palpations: Abdomen is soft.     Tenderness: There is no abdominal tenderness.  Musculoskeletal:        General: Swelling and tenderness present.     Cervical back: Neck supple.     Comments: No midline tenderness to palpation of the thoracic or lumbar spine.  Extremities atraumatic with the exception of the patient's left knee which appears diffusely swollen.  Patient was unable to extend the knee actively.  Her patella appears to be mildly high riding concerning for possible patellar tendon disruption.  She has diffuse swelling about the knee with no tenderness about the medial or lateral aspect of the knee.  No clear instability with valgus or varus stress.  Negative anterior drawer test.  Distal left lower extremity is neurovascular intact with 2+ DP pulses, intact sensation to light touch, 5 out of 5 strength.  Skin:    General: Skin is warm and dry.  Neurological:     Mental Status: She is alert.     Comments: Cranial nerves II through XII grossly intact.  Moving all 4 extremities spontaneously.  Sensation grossly intact all 4 extremities    ED Results / Procedures / Treatments   Labs (all labs ordered are listed, but only abnormal results are displayed) Labs Reviewed - No data to display  EKG None  Radiology CT Knee Left Wo Contrast  Result Date: 09/27/2021 CLINICAL DATA:  Knee trauma, occult fracture suspected, xray done EXAM: CT OF THE LEFT KNEE WITHOUT CONTRAST TECHNIQUE: Multidetector CT imaging of the LEFT knee was performed according to the standard protocol. Multiplanar CT image  reconstructions were also generated. COMPARISON:  Same day radiograph FINDINGS: Bones/Joint/Cartilage There is a nondisplaced posterolateral tibial plateau fracture extending to the articular surface and tibial eminence. There is no articular surface depression. There is a large lipohemarthrosis. There is tricompartment osteoarthritis with bulky osteophyte formation, worst in the medial compartment. Small Baker cyst. Ligaments Suboptimally assessed by CT. Muscles and Tendons No muscle atrophy.  No mitral muscular collection. Soft tissues Soft tissue swelling along the knee. IMPRESSION: Nondisplaced posterolateral tibial plateau fracture extending to the articular surface and tibial eminence. No articular surface depression. Large lipohemarthrosis. Tricompartment osteoarthritis, worst in the medial compartment. Small Baker cyst. Electronically Signed   By: Maurine Simmering M.D.   On: 09/27/2021 15:40   DG Knee Complete 4 Views Left  Result Date: 09/27/2021 CLINICAL DATA:  Swelling and pain. EXAM: LEFT KNEE - COMPLETE 4+ VIEW COMPARISON:  None. FINDINGS: Osteopenia. No definite acute fracture. No dislocation. There is tricompartmental moderate joint space narrowing and associated osteophytes. Moderate joint effusion. Regional soft tissues are unremarkable. IMPRESSION: 1. No acute osseous abnormality. 2. Moderate tricompartmental degenerative changes with moderate joint effusion. Electronically Signed   By: Audie Pinto M.D.   On: 09/27/2021 14:29    Procedures Procedures   Medications Ordered in ED Medications  ibuprofen (ADVIL) tablet 800 mg (800 mg Oral Given 09/27/21 1501)    ED Course  I have reviewed the triage vital signs and the nursing notes.  Pertinent labs & imaging results that were available during my care of the patient were reviewed by me and considered in my  medical decision making (see chart for details).    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  The patient states that she was walking down the stairs at church when she missed a step and twisted her left knee.  She has had pain in her left knee with some associated swelling and difficulty bearing weight.  She denies any fall or loss of consciousness, head trauma or neck trauma.  She denies any other complaints other than knee pain.  She states that she normally gets around utilizing a walker.  On arrival, the patient's left lower extremity was neurovascularly intact.  She presents with difficulty with weightbearing, difficulty with extension of the knee after twisting it while walking down steps earlier today.  X-ray imaging was performed which revealed a moderate joint effusion with degenerative changes but no acute bony abnormality.  Given the patient's inability to extend the knee, concern for possible patellar tendon injury to include tear or rupture.  Additional concern for possible tibial plateau fracture given the patient's inability to bear weight and diffuse swelling about this area.  Will obtain CT of the knee for further clarification.  The patient was placed in a knee immobilizer.  She does have a walker for mobility.  She does follow-up outpatient with [facility] due to her prior right total knee.  Plan at time of signout to follow the patient's CT of the left knee.  Likely plan for outpatient follow-up with [facility] later this week.  Signout given to Dr. Karle Starch at [phone].   Final Clinical Impression(s) / ED Diagnoses Final diagnoses:  Injury of left knee, initial encounter  Closed fracture of left tibial plateau, initial encounter    Rx / DC Orders ED Discharge Orders     None        Regan Lemming, MD 09/27/21 1647

## 2021-09-27 NOTE — ED Notes (Signed)
Patient transported to CT 

## 2021-09-27 NOTE — ED Notes (Signed)
Patient stated that she could use her walker instead of crutches

## 2021-09-27 NOTE — ED Notes (Signed)
Ambulated patient to restroom via/ wheelchair / with moderate assist

## 2021-09-28 DIAGNOSIS — Z96651 Presence of right artificial knee joint: Secondary | ICD-10-CM | POA: Diagnosis not present

## 2021-09-28 DIAGNOSIS — M25562 Pain in left knee: Secondary | ICD-10-CM | POA: Diagnosis not present

## 2021-10-07 ENCOUNTER — Telehealth: Payer: Self-pay | Admitting: Family Medicine

## 2021-10-07 DIAGNOSIS — R3915 Urgency of urination: Secondary | ICD-10-CM

## 2021-10-07 MED ORDER — MIRABEGRON ER 25 MG PO TB24: 25.0000 mg | ORAL_TABLET | Freq: Every day | ORAL | 0 refills | Status: DC

## 2021-10-07 NOTE — Telephone Encounter (Signed)
Patient called because she had previously discussed with Dr.Burchette, in October, concerns about her leaky bladder and he mentioned he could prescribed something for her. Patient states that over time it has gotten a bit worse and she would now like to be prescribed the medication.   Please send to  [id number] - [place], [place] - [address] Phone:  [phone]  Fax:  [phone]       Patient would like a call when prescription is sent   Please advise

## 2021-10-07 NOTE — Telephone Encounter (Signed)
Last OV 07/28/21: #1 urinary urgency.  Discussed lifestyle management.  We did discuss potential options such as Myrbetriq but at this point she does not feel this is enough problem to warrant medication

## 2021-10-07 NOTE — Telephone Encounter (Signed)
Pt would like a call back if the prescription is sent in so that she can make arrangements to have it picked up

## 2021-10-07 NOTE — Addendum Note (Signed)
Addended by: Elza Rafter D on: 10/07/2021 02:37 PM   Modules accepted: Orders

## 2021-10-07 NOTE — Telephone Encounter (Signed)
Pt notified that medication has been filled to pharmacy requested. Instructions given on how to take it, pt verb understanding. Pt aware that she should call back in a few weeks to follow up on if the medication was helpful.

## 2021-10-07 NOTE — Telephone Encounter (Signed)
See below messages

## 2021-10-15 DIAGNOSIS — Z96651 Presence of right artificial knee joint: Secondary | ICD-10-CM | POA: Diagnosis not present

## 2021-10-15 DIAGNOSIS — M25562 Pain in left knee: Secondary | ICD-10-CM | POA: Diagnosis not present

## 2021-10-15 DIAGNOSIS — S82102D Unspecified fracture of upper end of left tibia, subsequent encounter for closed fracture with routine healing: Secondary | ICD-10-CM | POA: Diagnosis not present

## 2021-11-03 ENCOUNTER — Other Ambulatory Visit: Payer: Self-pay | Admitting: Family Medicine

## 2021-11-03 DIAGNOSIS — R3915 Urgency of urination: Secondary | ICD-10-CM

## 2021-11-13 DIAGNOSIS — M25562 Pain in left knee: Secondary | ICD-10-CM | POA: Diagnosis not present

## 2021-11-13 DIAGNOSIS — S82102D Unspecified fracture of upper end of left tibia, subsequent encounter for closed fracture with routine healing: Secondary | ICD-10-CM | POA: Diagnosis not present

## 2021-12-11 DIAGNOSIS — M25562 Pain in left knee: Secondary | ICD-10-CM | POA: Diagnosis not present

## 2021-12-29 ENCOUNTER — Encounter: Payer: Self-pay | Admitting: Family Medicine

## 2021-12-29 ENCOUNTER — Ambulatory Visit (INDEPENDENT_AMBULATORY_CARE_PROVIDER_SITE_OTHER): Payer: Medicare HMO | Admitting: Family Medicine

## 2021-12-29 VITALS — BP 102/68 | HR 68 | Temp 99.5°F | Wt 160.0 lb

## 2021-12-29 DIAGNOSIS — R55 Syncope and collapse: Secondary | ICD-10-CM | POA: Diagnosis not present

## 2021-12-29 DIAGNOSIS — I959 Hypotension, unspecified: Secondary | ICD-10-CM | POA: Diagnosis not present

## 2021-12-29 DIAGNOSIS — N3281 Overactive bladder: Secondary | ICD-10-CM | POA: Diagnosis not present

## 2021-12-29 NOTE — Progress Notes (Signed)
? ?Subjective:  ? ? Patient ID: Megan Reilly, female    DOB: 11-Oct-1941, 81 y.o.   MRN: [id number] ? ?HPI ?Here to talk about a near syncopal episode that occurred around 3 am this morning. She got out of bed to go to the bathroom, and after urinating she stepped over in front of the sink. She immediately felt like she might pass out, and she slowly lowered herself to the floor. She never had full LOC and she remembers the incident well. She denies any headache or vertigo, no chest pain or palpitations or SOB. After lying on the floor a few minutes she was able to pull herself up and she went back to bed. Today she feels fine. She has a hx of several vasovagal episodes on the past, and says this felt just like they did. Her PCP is Dr. Elease Hashimoto, and they have been addressing what sounds like OAB. A few months ago she had been troubled by sudden urges to urinate, so started her on Myrbetriq in January. Since then she has been taking this every other day, and it has seemed to help the bladder issue. She admits to drinking 2 cups of regular coffee and 2-3 cups of herbal tea a day, but she drinks very little. She had an ECHO in 2015 which showed normal LV function. She also had carotid dopplers at that time which showed 1-39% blockages in both carotids.  ? ? ?Review of Systems  ?Constitutional: Negative.   ?Respiratory: Negative.    ?Cardiovascular: Negative.   ?Gastrointestinal: Negative.   ?Neurological:  Positive for light-headedness. Negative for dizziness, tremors, seizures, facial asymmetry, speech difficulty, weakness, numbness and headaches.  ? ?   ?Objective:  ? Physical Exam ?Constitutional:   ?   General: She is not in acute distress. ?   Appearance: Normal appearance.  ?   Comments: She walks with a cane   ?Eyes:  ?   Extraocular Movements: Extraocular movements intact.  ?   Pupils: Pupils are equal, round, and reactive to light.  ?Neck:  ?   Vascular: No carotid bruit.  ?Cardiovascular:  ?   Rate and  Rhythm: Normal rate and regular rhythm.  ?   Pulses: Normal pulses.  ?   Heart sounds: Normal heart sounds.  ?Pulmonary:  ?   Effort: Pulmonary effort is normal.  ?   Breath sounds: Normal breath sounds.  ?Musculoskeletal:  ?   Right lower leg: No edema.  ?   Left lower leg: No edema.  ?Lymphadenopathy:  ?   Cervical: No cervical adenopathy.  ?Neurological:  ?   General: No focal deficit present.  ?   Mental Status: She is alert and oriented to person, place, and time.  ?   Motor: No weakness.  ?   Coordination: Coordination normal.  ?   Gait: Gait normal.  ? ? ? ? ? ?   ?Assessment & Plan:  ?She has had another vasovagal episode, and I explained that a post micturition vasovagal is quite common. She has a fairly low BP today, which is unusual for her, and we need to keep an eye on this. I encouraged her to drink less caffeinated beverages and to drink more water. The Myrbetriq may have also contributed to this episode, so I advised her to stop taking it. She will follow up with Dr. Elease Hashimoto sometime soon. We spent a total of ( 32  ) minutes reviewing records and discussing these issues.  ?Alysia Penna, MD ? ? ? ?

## 2021-12-30 ENCOUNTER — Ambulatory Visit: Payer: Medicare HMO | Admitting: Family Medicine

## 2021-12-31 DIAGNOSIS — S82102D Unspecified fracture of upper end of left tibia, subsequent encounter for closed fracture with routine healing: Secondary | ICD-10-CM | POA: Diagnosis not present

## 2022-02-17 DIAGNOSIS — M25562 Pain in left knee: Secondary | ICD-10-CM | POA: Diagnosis not present

## 2022-02-28 DIAGNOSIS — M25562 Pain in left knee: Secondary | ICD-10-CM | POA: Diagnosis not present

## 2022-03-02 ENCOUNTER — Encounter: Payer: Self-pay | Admitting: Obstetrics & Gynecology

## 2022-03-02 ENCOUNTER — Ambulatory Visit (INDEPENDENT_AMBULATORY_CARE_PROVIDER_SITE_OTHER): Payer: Medicare HMO | Admitting: Obstetrics & Gynecology

## 2022-03-02 VITALS — BP 110/80 | HR 70 | Resp 16 | Ht 66.75 in | Wt 158.0 lb

## 2022-03-02 DIAGNOSIS — Z01419 Encounter for gynecological examination (general) (routine) without abnormal findings: Secondary | ICD-10-CM

## 2022-03-02 DIAGNOSIS — M810 Age-related osteoporosis without current pathological fracture: Secondary | ICD-10-CM

## 2022-03-02 DIAGNOSIS — Z780 Asymptomatic menopausal state: Secondary | ICD-10-CM

## 2022-03-02 NOTE — Progress Notes (Signed)
Megan Reilly 1941-09-14 [id number]   History:    80 y.o. [id number]  RP:  Established patient presenting for annual gyn exam   HPI: S/P BSO. Postmenopause, well on no HRT.  No PMB.  No pelvic pain.  Abstinent.  Pap Neg. 12/2010.  Breasts normal.  Mammo Neg 05/2021.  BD 04/2021 with Osteoporosis on Alendronate x 5 years, stopped.  Not exercising as much d/t a left knee issue.  Vit D supplements 2500 IU daily and good nutritional Ca++ intake with supplement.  No recent fall.  BMI 24.93. Health labs with Fam MD.  COLONOSCOPY: 03-24-15.  Past medical history,surgical history, family history and social history were all reviewed and documented in the EPIC chart.  Gynecologic History H/O BSO  Obstetric History OB History  Gravida Para Term Preterm AB Living  '6 5     1 5  '$ SAB IAB Ectopic Multiple Live Births  1            # Outcome Date GA Lbr Len/2nd Weight Sex Delivery Anes PTL Lv  6 SAB           5 Para           4 Para           3 Para           2 Para           1 Para              ROS: A ROS was performed and pertinent positives and negatives are included in the history.  GENERAL: No fevers or chills. HEENT: No change in vision, no earache, sore throat or sinus congestion. NECK: No pain or stiffness. CARDIOVASCULAR: No chest pain or pressure. No palpitations. PULMONARY: No shortness of breath, cough or wheeze. GASTROINTESTINAL: No abdominal pain, nausea, vomiting or diarrhea, melena or bright red blood per rectum. GENITOURINARY: No urinary frequency, urgency, hesitancy or dysuria. MUSCULOSKELETAL: No joint or muscle pain, no back pain, no recent trauma. DERMATOLOGIC: No rash, no itching, no lesions. ENDOCRINE: No polyuria, polydipsia, no heat or cold intolerance. No recent change in weight. HEMATOLOGICAL: No anemia or easy bruising or bleeding. NEUROLOGIC: No headache, seizures, numbness, tingling or weakness. PSYCHIATRIC: No depression, no loss of interest in normal activity or  change in sleep pattern.     Exam:  BP 110/80   Pulse 70   Resp 16   Ht 5' 6.75" (1.695 m)   Wt 158 lb (71.7 kg)   BMI 24.93 kg/m   Body mass index is 24.93 kg/m.  General appearance : Well developed well nourished female. No acute distress HEENT: Eyes: no retinal hemorrhage or exudates,  Neck supple, trachea midline, no carotid bruits, no thyroidmegaly Lungs: Clear to auscultation, no rhonchi or wheezes, or rib retractions  Heart: Regular rate and rhythm, no murmurs or gallops Breast:Examined in sitting and supine position were symmetrical in appearance, no palpable masses or tenderness,  no skin retraction, no nipple inversion, no nipple discharge, no skin discoloration, no axillary or supraclavicular lymphadenopathy Abdomen: no palpable masses or tenderness, no rebound or guarding Extremities: no edema or skin discoloration or tenderness  Pelvic: Vulva: Normal             Vagina: No gross lesions or discharge  Cervix: No gross lesions or discharge  Uterus  AV, normal size, shape and consistency, non-tender and mobile  Adnexa  Without masses or tenderness  Anus: Normal  Assessment/Plan:  81  y.o. female for annual exam   1. Well female exam with routine gynecological exam S/P BSO. Postmenopause, well on no HRT.  No PMB.  No pelvic pain.  Abstinent.  Pap Neg. 12/2010.  Breasts normal.  Mammo Neg 05/2021.  BD 04/2021 with Osteoporosis on Alendronate x 5 years, stopped.  Not exercising as much d/t a left knee issue.  Vit D supplements 2500 IU daily and good nutritional Ca++ intake with supplement.  No recent fall.  BMI 24.93. Health labs with Fam MD.  COLONOSCOPY: 03-24-15.  2. Postmenopause S/P BSO. Postmenopause, well on no HRT.  No PMB.  No pelvic pain.  Abstinent.    3. Age-related osteoporosis without current pathological fracture  BD 04/2021 with Osteoporosis on Alendronate x 5 years, stopped.  Not exercising as much d/t a left knee issue.  Vit D supplements 2500 IU daily and  good nutritional Ca++ intake with supplement.  No recent fall.  BMI 24.93.  Will repeat BD in 04/2023.  Other orders - IBUPROFEN PO; Take by mouth.   Princess Bruins MD, 10:45 AM 03/02/2022

## 2022-03-04 DIAGNOSIS — M25562 Pain in left knee: Secondary | ICD-10-CM | POA: Diagnosis not present

## 2022-03-24 DIAGNOSIS — M1712 Unilateral primary osteoarthritis, left knee: Secondary | ICD-10-CM | POA: Diagnosis not present

## 2022-03-24 DIAGNOSIS — M25562 Pain in left knee: Secondary | ICD-10-CM | POA: Diagnosis not present

## 2022-04-21 ENCOUNTER — Other Ambulatory Visit: Payer: Self-pay | Admitting: Family Medicine

## 2022-04-21 DIAGNOSIS — Z1231 Encounter for screening mammogram for malignant neoplasm of breast: Secondary | ICD-10-CM

## 2022-04-26 ENCOUNTER — Other Ambulatory Visit: Payer: Self-pay | Admitting: Family Medicine

## 2022-05-19 DIAGNOSIS — D329 Benign neoplasm of meninges, unspecified: Secondary | ICD-10-CM | POA: Diagnosis not present

## 2022-05-19 DIAGNOSIS — G9389 Other specified disorders of brain: Secondary | ICD-10-CM | POA: Diagnosis not present

## 2022-06-07 ENCOUNTER — Ambulatory Visit
Admission: RE | Admit: 2022-06-07 | Discharge: 2022-06-07 | Disposition: A | Discharge status: Home / Self Care | Payer: Medicare HMO | Source: Ambulatory Visit | Attending: Family Medicine | Admitting: Family Medicine

## 2022-06-07 DIAGNOSIS — Z1231 Encounter for screening mammogram for malignant neoplasm of breast: Secondary | ICD-10-CM | POA: Diagnosis not present

## 2022-06-09 DIAGNOSIS — D329 Benign neoplasm of meninges, unspecified: Secondary | ICD-10-CM | POA: Diagnosis not present

## 2022-06-22 ENCOUNTER — Ambulatory Visit (INDEPENDENT_AMBULATORY_CARE_PROVIDER_SITE_OTHER): Payer: Medicare HMO | Admitting: Physician Assistant

## 2022-06-22 ENCOUNTER — Encounter: Payer: Self-pay | Admitting: Physician Assistant

## 2022-06-22 VITALS — BP 160/80 | HR 67 | Temp 98.2°F | Ht 66.75 in | Wt 163.4 lb

## 2022-06-22 DIAGNOSIS — U071 COVID-19: Secondary | ICD-10-CM

## 2022-06-22 DIAGNOSIS — R030 Elevated blood-pressure reading, without diagnosis of hypertension: Secondary | ICD-10-CM | POA: Diagnosis not present

## 2022-06-22 LAB — POC COVID19 BINAXNOW: SARS Coronavirus 2 Ag: POSITIVE — AB

## 2022-06-22 MED ORDER — NIRMATRELVIR/RITONAVIR (PAXLOVID)TABLET: 3.0000 | ORAL_TABLET | Freq: Two times a day (BID) | ORAL | 0 refills | Status: AC

## 2022-06-22 NOTE — Patient Instructions (Signed)
It was great to see you!  You have COVID. Start paxlovid, I have sent this in.  For current/suspected COVID symptoms: - Please watch closely for new onset shortness of breath, worsening shortness of breath, dizziness, confusion or any worsening symptoms. If any of these occur, please contact us during business hours, and if after business hours, please seek urgent care or go to the closest emergency room.  -Consider purchasing a pulse oximeter. If your levels are 94% or below persistently, please seek care at the hospital.   -If you test positive for COVID, everyone, regardless of vaccination status, should stay home for 5 days since symptom onset (or if asymptomatic, on day of positive test.) If you have no symptoms or your symptoms are resolving after 5 days, you can leave your house. Continue to wear a mask around others for 5 additional days. If you have a fever, continue to stay home until your fever resolves without use of medication.  -Please inform any contacts of your positive result so they can appropriately quarantine/test.  -Push fluids and try to eat small, frequent meals with protein to maintain your stamina.  Please keep an eye on your blood pressure and follow-up with Dr. Elease Hashimoto about this.  Take care,  Inda Coke PA-C

## 2022-06-22 NOTE — Progress Notes (Signed)
Megan Reilly is a 81 y.o. female here for a new problem.  History of Present Illness:   Chief Complaint  Patient presents with   Cough    Pt c/o dry cough, nasal congestion started on Monday.    HPI   COVID Patient reports that she was at the beach with her family this weekend.  She started to feel sinus symptoms yesterday.  She also felt fatigue which she attributed to her recent travels.  She has not taken anything for her symptoms.  She denies prior COVID infection.  She denies: History of asthma, COPD, chest pain, shortness of breath, fever, chills, malaise  She has received all COVID vaccines and boosters.  She has normal kidney function.  This is day 2 of her symptoms. She does not have high blood pressure   Elevated blood pressure reading She is really not on any medications for blood pressure.  In fact she has history of low blood pressure and vasovagal syncope.  Patient denies chest pain, SOB, blurred vision, dizziness, unusual headaches, lower leg swelling.  Denies excessive caffeine intake, stimulant usage, excessive alcohol intake, or increase in salt consumption.  BP Readings from Last 3 Encounters:  06/22/22 (!) 160/80  03/02/22 110/80  12/29/21 102/68     Past Medical History:  Diagnosis Date   Allergy    Arthritis    Basal cell carcinoma 10/09/2012   right upper arm tx with bx   BCC (basal cell carcinoma of skin) 12/03/2014   right upper arm bcc nod. 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 ; treated in ED , reports no recurrence since then      Social History   Tobacco Use   Smoking status: Never   Smokeless tobacco: Never  Vaping Use   Vaping Use:  Never used  Substance Use Topics   Alcohol use: Not Currently    Comment: occ   Drug use: No    Past Surgical History:  Procedure Laterality Date   COLONOSCOPY     FOOT SURGERY     bunectomy, hammertoe, bil   LAPAROSCOPIC BILATERAL SALPINGO OOPHERECTOMY     TONSILLECTOMY  10/12/1943   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   Procedure: RIGHT TOTAL KNEE ARTHROPLASTY;  Surgeon: Gaynelle Arabian, MD;  Location: [facility];  Service: Orthopedics;  Laterality: Right;    Family History  Problem Relation Age of Onset   Heart disease Mother    Hyperlipidemia Mother    Stroke Mother    Heart failure Mother    Diabetes Father        type ll   Heart disease Father 29       sudden death age 81   Hyperlipidemia Father    Breast cancer Maternal Aunt     Allergies  Allergen Reactions   Hctz [Hydrochlorothiazide] Other (See Comments)    Fatigue, body aches    Current Medications:   Current Outpatient Medications:    calcium carbonate (OS-CAL) 600 MG TABS, Take 300 mg by mouth every other day., Disp: , Rfl:    cetirizine (ZYRTEC) 10 MG tablet, Take 10  mg by mouth daily as needed for allergies., Disp: , Rfl:    Cholecalciferol (VITAMIN D-3 PO), Take 2,500 Units by mouth daily. , Disp: , Rfl:    fish oil-omega-3 fatty acids 1000 MG capsule, Take 1 g by mouth daily. , Disp: , Rfl:    fluticasone (FLONASE) 50 MCG/ACT nasal spray, Place 2 sprays into both nostrils daily. Place 1 to 2 sparys in each nostril daily., Disp: 16 g, Rfl: 6   IBUPROFEN PO, Take by mouth., Disp: , Rfl:    latanoprost (XALATAN) 0.005 % ophthalmic solution, 1 drop at bedtime., Disp: , Rfl:    Magnesium 400 MG CAPS, Take 1 tablet by mouth daily. , Disp: , Rfl:    Melatonin 3 MG TABS, Take 3 mg by mouth. 3-4 times a week, Disp: , Rfl:    Multiple Vitamin (MULTIVITAMIN WITH MINERALS) TABS tablet, Take 1 tablet by mouth every morning., Disp: , Rfl:    nirmatrelvir/ritonavir EUA (PAXLOVID) 20 x 150 MG & 10 x '100MG'$  TABS,  Take 3 tablets by mouth 2 (two) times daily for 5 days. (Take nirmatrelvir 150 mg two tablets twice daily for 5 days and ritonavir 100 mg one tablet twice daily for 5 days), Disp: 30 tablet, Rfl: 0   Review of Systems:   ROS Negative unless otherwise specified per HPI.   Vitals:   Vitals:   06/22/22 1115  BP: (!) 160/80  Pulse: 67  Temp: 98.2 F (36.8 C)  TempSrc: Temporal  SpO2: 95%  Weight: 163 lb 6.1 oz (74.1 kg)  Height: 5' 6.75" (1.695 m)     Body mass index is 25.78 kg/m.  Physical Exam:   Physical Exam Vitals and nursing note reviewed.  Constitutional:      General: She is not in acute distress.    Appearance: She is well-developed. She is not ill-appearing or toxic-appearing.  HENT:     Head: Normocephalic and atraumatic.     Right Ear: Tympanic membrane, ear canal and external ear normal. Tympanic membrane is not erythematous, retracted or bulging.     Left Ear: Tympanic membrane, ear canal and external ear normal. Tympanic membrane is not erythematous, retracted or bulging.     Nose: Nose normal.     Right Sinus: No maxillary sinus tenderness or frontal sinus tenderness.     Left Sinus: No maxillary sinus tenderness or frontal sinus tenderness.     Mouth/Throat:     Pharynx: Uvula midline. No posterior oropharyngeal erythema.  Eyes:     General: Lids are normal.     Conjunctiva/sclera: Conjunctivae normal.  Neck:     Trachea: Trachea normal.  Cardiovascular:     Rate and Rhythm: Normal rate and regular rhythm.     Heart sounds: Normal heart sounds, S1 normal and S2 normal.  Pulmonary:     Effort: Pulmonary effort is normal.     Breath sounds: Normal breath sounds. No decreased breath sounds, wheezing, rhonchi or rales.  Lymphadenopathy:     Cervical: No cervical adenopathy.  Skin:    General: Skin is warm and dry.  Neurological:     Mental Status: She is alert.  Psychiatric:        Speech: Speech normal.        Behavior: Behavior normal. Behavior  is cooperative.     Assessment and Plan:   COVID No red flags on our discussion or on her exam She is a good candidate for Paxlovid, I have sent this in for her --  side effects, risks, benefits discussed with patient Discussed isolation and quarantine guidelines Recommend that she reach out to her contacts about this Follow-up with PCP if new or worsening symptoms  Elevated blood pressure reading No red flags on exam No evidence of endorgan damage on my exam Unfortunately she left our office prior to blood pressure being rechecked I recommend that she monitor this at home and follow-up with her PCP if it becomes persistently elevated --she notes she has an upcoming appointment with her PCP in 2 to 3 weeks If any chest pain, shortness of breath, vision changes or headache she was instructed to immediately proceed to the emergency room     Inda Coke, PA-C

## 2022-07-01 ENCOUNTER — Telehealth: Payer: Self-pay

## 2022-07-01 NOTE — Telephone Encounter (Signed)
Contacted patient on preferred number listed in notes for scheduled AWV. Patient stated refusal to complete this visit and will discuss health care and concerns with PCP upon office visits.

## 2022-07-07 ENCOUNTER — Telehealth: Payer: Self-pay | Admitting: Family Medicine

## 2022-07-07 NOTE — Telephone Encounter (Signed)
Left message for patient to call back and schedule Medicare Annual Wellness Visit (AWV) either virtually or in office. Left  my Megan Reilly number [phone]   Last AWV  06/30/21 ; please schedule with Nurse Health Adviser   45 min for awvi and in office appointments 30 min for awv phone/virtual appointments

## 2022-07-13 ENCOUNTER — Ambulatory Visit (INDEPENDENT_AMBULATORY_CARE_PROVIDER_SITE_OTHER): Payer: Medicare HMO

## 2022-07-13 VITALS — Ht 68.0 in | Wt 158.0 lb

## 2022-07-13 DIAGNOSIS — Z0000 Encounter for general adult medical examination without abnormal findings: Secondary | ICD-10-CM

## 2022-07-13 NOTE — Patient Instructions (Addendum)
Megan Reilly , Thank you for taking time to come for your Medicare Wellness Visit. I appreciate your ongoing commitment to your health goals. Please review the following plan we discussed and let me know if I can assist you in the future.   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 The date shown is not the original due date.    Advanced directives: Copy in chart  Conditions/risks identified: None  Next appointment: Follow up in one year for your annual wellness visit     Preventive Care 65 Years and Older, Female Preventive care refers to lifestyle choices and visits with your health care provider that can promote health and wellness. What does preventive care include? A yearly physical exam. This is also called an annual well check. Dental exams once or twice a year. Routine eye exams. Ask your health care provider how often you should have your eyes checked. Personal lifestyle choices, including: Daily care of your teeth and gums. Regular physical activity. Eating a healthy diet. Avoiding tobacco and drug use. Limiting alcohol use. Practicing safe sex. Taking low-dose aspirin every day. Taking vitamin and mineral supplements as recommended by your health care provider. What happens during an annual well check? The services and screenings done by your health care provider during your annual well  check will depend on your age, overall health, lifestyle risk factors, and family history of disease. Counseling  Your health care provider may ask you questions about your: Alcohol use. Tobacco use. Drug use. Emotional well-being. Home and relationship well-being. Sexual activity. Eating habits. History of falls. Memory and ability to understand (cognition). Work and work Statistician. Reproductive health. Screening  You may have the following tests or measurements: Height, weight, and BMI. Blood pressure. Lipid and cholesterol levels. These may be checked every 5 years, or more frequently if you are over 56 years old. Skin check. Lung cancer screening. You may have this screening every year starting at age 82 if you have a 30-pack-year history of smoking and currently smoke or have quit within the past 15 years. Fecal occult blood test (FOBT) of the stool. You may have this test every year starting at age 86. Flexible sigmoidoscopy or colonoscopy. You may have a sigmoidoscopy every 5 years or a colonoscopy every 10 years starting at age 53. Hepatitis C blood test. Hepatitis B blood test. Sexually transmitted disease (STD) testing. Diabetes screening. This is done by checking your blood sugar (glucose) after you have not eaten for a while (fasting). You may have this done every 1-3 years. Bone density scan. This is done to screen for osteoporosis. You may have this done starting at age 44. Mammogram. This may  be done every 1-2 years. Talk to your health care provider about how often you should have regular mammograms. Talk with your health care provider about your test results, treatment options, and if necessary, the need for more tests. Vaccines  Your health care provider may recommend certain vaccines, such as: Influenza vaccine. This is recommended every year. Tetanus, diphtheria, and acellular pertussis (Tdap, Td) vaccine. You may need a Td booster every 10 years. Zoster  vaccine. You may need this after age 4. Pneumococcal 13-valent conjugate (PCV13) vaccine. One dose is recommended after age 68. Pneumococcal polysaccharide (PPSV23) vaccine. One dose is recommended after age 45. Talk to your health care provider about which screenings and vaccines you need and how often you need them. This information is not intended to replace advice given to you by your health care provider. Make sure you discuss any questions you have with your health care provider. Document Released: 10/24/2015 Document Revised: 06/16/2016 Document Reviewed: 07/29/2015 Elsevier Interactive Patient Education  2017 [employer] Prevention in the Home Falls can cause injuries. They can happen to people of all ages. There are many things you can do to make your home safe and to help prevent falls. What can I do on the outside of my home? Regularly fix the edges of walkways and driveways and fix any cracks. Remove anything that might make you trip as you walk through a door, such as a raised step or threshold. Trim any bushes or trees on the path to your home. Use bright outdoor lighting. Clear any walking paths of anything that might make someone trip, such as rocks or tools. Regularly check to see if handrails are loose or broken. Make sure that both sides of any steps have handrails. Any raised decks and porches should have guardrails on the edges. Have any leaves, snow, or ice cleared regularly. Use sand or salt on walking paths during winter. Clean up any spills in your garage right away. This includes oil or grease spills. What can I do in the bathroom? Use night lights. Install grab bars by the toilet and in the tub and shower. Do not use towel bars as grab bars. Use non-skid mats or decals in the tub or shower. If you need to sit down in the shower, use a plastic, non-slip stool. Keep the floor dry. Clean up any water that spills on the floor as soon as it happens. Remove  soap buildup in the tub or shower regularly. Attach bath mats securely with double-sided non-slip rug tape. Do not have throw rugs and other things on the floor that can make you trip. What can I do in the bedroom? Use night lights. Make sure that you have a light by your bed that is easy to reach. Do not use any sheets or blankets that are too big for your bed. They should not hang down onto the floor. Have a firm chair that has side arms. You can use this for support while you get dressed. Do not have throw rugs and other things on the floor that can make you trip. What can I do in the kitchen? Clean up any spills right away. Avoid walking on wet floors. Keep items that you use a lot in easy-to-reach places. If you need to reach something above you, use a strong step stool that has a grab bar. Keep electrical cords out of the way. Do not use floor polish or wax that makes floors slippery. If you must use  wax, use non-skid floor wax. Do not have throw rugs and other things on the floor that can make you trip. What can I do with my stairs? Do not leave any items on the stairs. Make sure that there are handrails on both sides of the stairs and use them. Fix handrails that are broken or loose. Make sure that handrails are as long as the stairways. Check any carpeting to make sure that it is firmly attached to the stairs. Fix any carpet that is loose or worn. Avoid having throw rugs at the top or bottom of the stairs. If you do have throw rugs, attach them to the floor with carpet tape. Make sure that you have a light switch at the top of the stairs and the bottom of the stairs. If you do not have them, ask someone to add them for you. What else can I do to help prevent falls? Wear shoes that: Do not have high heels. Have rubber bottoms. Are comfortable and fit you well. Are closed at the toe. Do not wear sandals. If you use a stepladder: Make sure that it is fully opened. Do not climb a  closed stepladder. Make sure that both sides of the stepladder are locked into place. Ask someone to hold it for you, if possible. Clearly mark and make sure that you can see: Any grab bars or handrails. First and last steps. Where the edge of each step is. Use tools that help you move around (mobility aids) if they are needed. These include: Canes. Walkers. Scooters. Crutches. Turn on the lights when you go into a dark area. Replace any light bulbs as soon as they burn out. Set up your furniture so you have a clear path. Avoid moving your furniture around. If any of your floors are uneven, fix them. If there are any pets around you, be aware of where they are. Review your medicines with your doctor. Some medicines can make you feel dizzy. This can increase your chance of falling. Ask your doctor what other things that you can do to help prevent falls. This information is not intended to replace advice given to you by your health care provider. Make sure you discuss any questions you have with your health care provider. Document Released: 07/24/2009 Document Revised: 03/04/2016 Document Reviewed: 11/01/2014 Elsevier Interactive Patient Education  2017 [employer].

## 2022-07-13 NOTE — Progress Notes (Signed)
Subjective:   Megan Reilly is a 81 y.o. female who presents for Medicare Annual (Subsequent) preventive examination.  Review of Systems    Virtual Visit via Telephone Note  I connected with  Megan Reilly on 07/13/22 at 11:30 AM EDT by telephone and verified that I am speaking with the correct person using two identifiers.  Location: Patient: Home  Provider: Office Persons participating in the virtual visit: patient/Nurse Health Advisor   I discussed the limitations, risks, security and privacy concerns of performing an evaluation and management service by telephone and the availability of in person appointments. The patient expressed understanding and agreed to proceed.  Interactive audio and video telecommunications were attempted between this nurse and patient, however failed, due to patient having technical difficulties OR patient did not have access to video capability.  We continued and completed visit with audio only.  Some vital signs may be absent or patient reported.   Criselda Peaches, LPN  Cardiac Risk Factors include: advanced age (>65mn, >>81women)     Objective:    Today's Vitals   07/13/22 1122  Weight: 158 lb (71.7 kg)  Height: '5\' 8"'$  (1.727 m)   Body mass index is 24.02 kg/m.     07/13/2022   11:33 AM 09/27/2021    1:29 PM 06/30/2021    1:10 PM 06/26/2020    9:50 AM 03/27/2018    7:24 PM 03/21/2018   10:35 AM 10/12/2017    9:53 AM  Advanced Directives  Does Patient Have a Medical Advance Directive? Yes Yes Yes Yes Yes Yes Yes  Type of [place]amed[place]of [place] will [place]Living will HJer[place] VillageLiving will HBlue Ridge ManorLiving will Living will Living will   Does patient want to make changes to medical advance directive? No - Patient declined   No - Patient declined No - Patient declined No - Patient declined   Copy of HP[place]lawnin Chart? Yes -  validated most recent copy scanned in chart (See row information)  No - copy requested Yes - validated most recent copy scanned in chart (See row information)       Current Medications (verified) Outpatient Encounter Medications as of 07/13/2022  Medication Sig   calcium carbonate (OS-CAL) 600 MG TABS Take 300 mg by mouth every other day.   cetirizine (ZYRTEC) 10 MG tablet Take 10 mg by mouth daily as needed for allergies.   Cholecalciferol (VITAMIN D-3 PO) Take 2,500 Units by mouth daily.    fish oil-omega-3 fatty acids 1000 MG capsule Take 1 g by mouth daily.    fluticasone (FLONASE) 50 MCG/ACT nasal spray Place 2 sprays into both nostrils daily. Place 1 to 2 sparys in each nostril daily.   IBUPROFEN PO Take by mouth.   latanoprost (XALATAN) 0.005 % ophthalmic solution 1 drop at bedtime.   Magnesium 400 MG CAPS Take 1 tablet by mouth daily.    Melatonin 3 MG TABS Take 3 mg by mouth. 3-4 times a week   Multiple Vitamin (MULTIVITAMIN WITH MINERALS) TABS tablet Take 1 tablet by mouth every morning.   No facility-administered encounter medications on file as of 07/13/2022.    Allergies (verified) Hctz [hydrochlorothiazide]   History: Past Medical History:  Diagnosis Date   Allergy    Arthritis    Basal cell carcinoma 10/09/2012   right upper arm tx with bx   BCC (basal cell carcinoma of skin) 12/03/2014   right  upper arm bcc nod. 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   Procedure: RIGHT TOTAL KNEE ARTHROPLASTY;  Surgeon: Gaynelle Arabian, MD;  Location: [facility];  Service: Orthopedics;  Laterality: Right;   Family History  Problem Relation Age of Onset   Heart disease Mother    Hyperlipidemia Mother    Stroke Mother    Heart failure Mother    Diabetes Father        type ll   Heart disease Father 75       sudden death age 64   Hyperlipidemia Father    Breast cancer Maternal Aunt    Social History   Socioeconomic History   Marital status: Divorced    Spouse name: Not on file   Number of children: Not on file   Years of education: Not on file   Highest education level: Not on file  Occupational History   Not on file  Tobacco Use   Smoking status: Never   Smokeless tobacco: Never  Vaping Use   Vaping Use: Never used  Substance and Sexual Activity   Alcohol use: Not Currently    Comment: occ   Drug use: No   Sexual activity: Not Currently    Birth control/protection: Surgical    Comment: BSO ,INTERCOURSE AGE 3, SEXUAL PARTNERS LESS THAN 5  Other Topics Concern   Not on file  Social History Narrative   Not on file   Social Determinants of Health   Financial Resource Strain: Low Risk  (07/13/2022)   Overall Financial Resource Strain (CARDIA)    Difficulty of Paying Living Expenses: Not hard at all  Food Insecurity: No Food Insecurity (07/13/2022)   Hunger Vital Sign    Worried About Running Out of Food in the Last Year: Never true    Ran Out of Food in the Last Year: Never true  Transportation Needs: No Transportation Needs (07/13/2022)   PRAPARE - Hydrologist (Medical): No    Lack of Transportation (Non-Medical): No  Physical Activity: Sufficiently Active (07/13/2022)   Exercise Vital Sign    Days of Exercise per Week: 4 days    Minutes of Exercise per Session: 60 min  Stress: No Stress Concern Present (07/13/2022)   [place]    Feeling of Stress : Not at all  Social Connections: Moderately Integrated (07/13/2022)   Social Connection and Isolation Panel [NHANES]    Frequency of Communication with Friends and Family: More than three times a week    Frequency of Social Gatherings with Friends and Family: More than three times a week    Attends Religious Services: More than 4 times per year    Active Member of [employer] or Organizations: Yes  Attends Music therapist: More than 4 times per year    Marital Status: Divorced    Tobacco Counseling Counseling given: Not Answered   Clinical Intake:  Pre-visit preparation completed: No  Pain : No/denies pain     BMI - recorded: 24.02 Nutritional Status: BMI of 19-24  Normal Diabetes: No  How often do you need to have someone help you when you read instructions, pamphlets, or other written materials from your doctor or pharmacy?: 1 - Never  Diabetic? No  Interpreter Needed?: No  Information entered by :: Rolene Arbour LPN   Activities of Daily Living    07/13/2022   11:29 AM  In your present state of health, do you have any difficulty performing the following activities:  Hearing? 1  Comment Wears hearing aids  Vision? 0  Difficulty concentrating or making decisions? 0  Walking or climbing stairs? 0  Dressing or bathing? 0  Doing errands, shopping? 0  Preparing Food and eating ? N  Using the Toilet? N  In the past six months, have you accidently leaked urine? N  Do you have problems with loss of bowel control? N  Managing your Medications? N  Managing your Finances? N  Housekeeping or managing your Housekeeping? N    Patient Care Team: Eulas Post, MD as PCP - General (Family Medicine) Lavonna Monarch, MD (Inactive) as Consulting Physician (Dermatology)  Indicate any recent Medical Services you may have received from other than [facility] providers in the past year  (date may be approximate).     Assessment:   This is a routine wellness examination for [place].  Hearing/Vision screen Hearing Screening - Comments:: Wears hearing aids Vision Screening - Comments:: Wears rx glasses - up to date with routine eye exams with  Dr Satira Sark  Dietary issues and exercise activities discussed: Current Exercise Habits: Home exercise routine, Type of exercise: walking, Time (Minutes): 60, Frequency (Times/Week): 4, Weekly Exercise (Minutes/Week): 240, Intensity: Moderate, Exercise limited by: None identified   Goals Addressed               This Visit's Progress     Continue to be active (pt-stated)         Depression Screen    07/13/2022   11:28 AM 12/29/2021    3:56 PM 06/30/2021    1:12 PM 06/30/2021    1:08 PM 06/26/2020    9:51 AM 10/16/2019    8:30 AM 10/13/2018    7:17 AM  PHQ 2/9 Scores  PHQ - 2 Score 0 1 0 0 0 0 0  PHQ- 9 Score  5   0 0     Fall Risk    07/13/2022   11:30 AM 12/29/2021    3:54 PM 06/30/2021    1:12 PM 06/26/2020    9:50 AM 10/16/2019    8:50 AM  Fall Risk   Falls in the past year? 1 1 0 0 0  Number falls in past yr: 0 0 0 0   Injury with Fall? 1 1 0 0   Comment Fx Left knee Followed by Orthopedic      Risk for fall due to : Orthopedic patient No Fall Risks No Fall Risks Medication side effect No Fall Risks  Follow up Falls prevention discussed  Falls evaluation completed Falls evaluation completed;Falls prevention discussed     FALL RISK PREVENTION PERTAINING TO THE HOME:  Any stairs in or around the home? Yes  If so, are there any without  handrails? No  Home free of loose throw rugs in walkways, pet beds, electrical cords, etc? Yes  Adequate lighting in your home to reduce risk of falls? Yes   ASSISTIVE DEVICES UTILIZED TO PREVENT FALLS:  Life alert? No  Use of a cane, walker or w/c? No  Grab bars in the bathroom? Yes  Shower chair or bench in shower? Yes  Elevated toilet seat or a handicapped toilet? Yes   TIMED  UP AND GO:  Was the test performed? No . Audio Visit Cognitive Function:        07/13/2022   11:33 AM  6CIT Screen  What Year? 0 points  What month? 0 points  What time? 0 points  Count back from 20 0 points  Months in reverse 0 points  Repeat phrase 0 points  Total Score 0 points    Immunizations Immunization History  Administered Date(s) Administered   Fluad Quad(high Dose 65+) 07/17/2019, 06/24/2020, 07/01/2022   Influenza Split 07/22/2011, 08/09/2012   Influenza, High Dose Seasonal PF 07/24/2015, 07/15/2016, 07/04/2017, 07/03/2018, 07/03/2019, 07/09/2021   Influenza,inj,Quad PF,6+ Mos 06/26/2013, 07/15/2014   Influenza-Unspecified 07/03/2018   PFIZER Comirnaty(Gray Top)Covid-19 Tri-Sucrose Vaccine 01/16/2021   PFIZER(Purple Top)SARS-COV-2 Vaccination 10/23/2019, 11/12/2019, 06/29/2020, 01/16/2021, 07/09/2021   PPD Test 01/19/2013   Pfizer Covid-19 Vaccine Bivalent Booster 52yr & up 07/10/2021   Pneumococcal Conjugate-13 10/12/2007, 09/18/2013   Pneumococcal Polysaccharide-23 09/29/2016   Tdap 02/02/2011, 07/01/2022   Zoster Recombinat (Shingrix) 12/19/2017, 05/31/2018   Zoster, Live 07/11/2012    TDAP status: Up to date  Flu Vaccine status: Up to date  Pneumococcal vaccine status: Up to date  Covid-19 vaccine status: Completed vaccines  Qualifies for Shingles Vaccine? Yes   Zostavax completed Yes   Shingrix Completed?: Yes  Screening Tests Health Maintenance  Topic Date Due   COVID-19 Vaccine (7 - Pfizer risk series) 06/23/2023 (Originally 09/04/2021)   TETANUS/TDAP  07/01/2032   Pneumonia Vaccine 81 Years old  Completed   INFLUENZA VACCINE  Completed   DEXA SCAN  Completed   Zoster Vaccines- Shingrix  Completed   HPV VACCINES  Aged Out    Health Maintenance  There are no preventive care reminders to display for this patient.  Colorectal cancer screening: No longer required.   Mammogram status: No longer required due to Age.  Bone Density  status: Completed 04/21/21. Results reflect: Bone density results: OSTEOPOROSIS. Repeat every   years.  Lung Cancer Screening: (Low Dose CT Chest recommended if Age 81-80years, 30 pack-year currently smoking OR have quit w/in 15years.) does not qualify.     Additional Screening:  Hepatitis C Screening: does not qualify; Completed   Vision Screening: Recommended annual ophthalmology exams for early detection of glaucoma and other disorders of the eye. Is the patient up to date with their annual eye exam?  Yes  Who is the provider or what is the name of the office in which the patient attends annual eye exams? Dr TSatira SarkIf pt is not established with a provider, would they like to be referred to a provider to establish care? No .   Dental Screening: Recommended annual dental exams for proper oral hygiene  Community Resource Referral / Chronic Care Management:  CRR required this visit?  No   CCM required this visit?  No      Plan:     I have personally reviewed and noted the following in the patient's chart:   Medical and social history Use of alcohol, tobacco or illicit drugs  Current medications and supplements including opioid prescriptions. Patient is not currently taking opioid prescriptions. Functional ability and status Nutritional status Physical activity Advanced directives List of other physicians Hospitalizations, surgeries, and ER visits in previous 12 months Vitals Screenings to include cognitive, depression, and falls Referrals and appointments  In addition, I have reviewed and discussed with patient certain preventive protocols, quality metrics, and best practice recommendations. A written personalized care plan for preventive services as well as general preventive health recommendations were provided to patient.     Criselda Peaches, LPN   [id number]   Nurse Notes: None

## 2022-07-14 DIAGNOSIS — H26493 Other secondary cataract, bilateral: Secondary | ICD-10-CM | POA: Diagnosis not present

## 2022-07-14 DIAGNOSIS — H401232 Low-tension glaucoma, bilateral, moderate stage: Secondary | ICD-10-CM | POA: Diagnosis not present

## 2022-07-14 DIAGNOSIS — H04123 Dry eye syndrome of bilateral lacrimal glands: Secondary | ICD-10-CM | POA: Diagnosis not present

## 2022-07-20 NOTE — Progress Notes (Addendum)
Anesthesia Review:  PCP: Carolann Littler- [facility] 07/21/22 on chart.  Cardiologist : none  Chest x-ray : EKG : 07/21/22 on chart  Echo : Stress test: Cardiac Cath :  Activity level: can do a flight of stairs without difficulty  Sleep Study/ CPAP : none  Fasting Blood Sugar :      / Checks Blood Sugar -- times a day:   Blood Thinner/ Instructions /Last Dose: ASA / Instructions/ Last Dose :    PT was 25 minutes late for preop appt  Med hx and preop instructions completed.  PT given phone number to call pharmacy at [phone] to reconcile meds.  PT voiced understanding.   At preop pt states she has never had a problem with hypertension but the last 2 times she has been to PCP it has been elevated.  Dr Elease Hashimoto placed pt on NOrvasc 5 mg on 07/21/22 per pt and did ekg.  PT brought copy of ekg.  Placed on chart. PT ot have blood pressure rechecked at PCP on 08/04/22.    Covid positive on 9/12 /23 per pt  Reslts in epic.  Took paxlovid. AT preop pt feels fine.

## 2022-07-20 NOTE — H&P (Cosign Needed Addendum)
TOTAL KNEE ADMISSION H&P  Patient is being admitted for left total knee arthroplasty.  Subjective:  Chief Complaint: Left knee pain.  HPI: Megan Reilly, 81 y.o. female has a history of pain and functional disability in the left knee due to arthritis and has failed non-surgical conservative treatments for greater than 12 weeks to include NSAID's and/or analgesics, corticosteriod injections, use of assistive devices, and activity modification. Onset of symptoms was gradual, starting several years ago with gradually worsening course since that time. The patient noted no past surgery on the left knee.  Patient currently rates pain in the left knee at 7 out of 10 with activity. Patient has night pain, worsening of pain with activity and weight bearing, pain that interferes with activities of daily living, and pain with passive range of motion. Patient has evidence of  significant arthritic change in the lateral and patellofemoral compartments of the knee. There is even some medial compartment damage on the MR  by imaging studies. There is no active infection.  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 ; treated in ED , reports no recurrence since then     Past Surgical History:  Procedure Laterality Date   COLONOSCOPY     FOOT SURGERY     bunectomy, hammertoe, bil   LAPAROSCOPIC BILATERAL SALPINGO OOPHERECTOMY     TONSILLECTOMY  10/12/1943   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   Procedure: RIGHT TOTAL KNEE ARTHROPLASTY;  Surgeon: Gaynelle Arabian, MD;  Location: [facility];  Service: Orthopedics;  Laterality: Right;    Prior to Admission medications   Medication Sig Start Date End Date Taking? Authorizing Provider  calcium carbonate (OS-CAL) 600 MG TABS Take 300 mg by mouth every other day.    [provider]  cetirizine (ZYRTEC) 10 MG tablet Take 10 mg by mouth daily as needed for allergies.    [provider]  Cholecalciferol (VITAMIN D-3 PO) Take 2,500 Units by mouth daily.     [provider]  fish oil-omega-3 fatty acids 1000 MG capsule Take 1 g by mouth daily.     [provider]  fluticasone (FLONASE) 50 MCG/ACT nasal spray Place 2 sprays into both nostrils daily. Place 1 to 2  sparys in each nostril daily. 04/21/21   Burchette, Alinda Sierras, MD  IBUPROFEN PO Take by mouth.    [provider]  latanoprost (XALATAN) 0.005 % ophthalmic solution 1 drop at bedtime. 04/14/22   [provider]  Magnesium 400 MG CAPS Take 1 tablet by mouth daily.     [provider]  Melatonin 3 MG TABS Take 3 mg by mouth. 3-4 times a week    [provider]  Multiple Vitamin (MULTIVITAMIN WITH MINERALS) TABS tablet Take 1 tablet by mouth every morning.    [provider]    Allergies  Allergen Reactions   Hctz [Hydrochlorothiazide] Other (See Comments)    Fatigue, body aches    Social History   Socioeconomic History   Marital status: Divorced    Spouse name: Not on file   Number of children: Not on file   Years of education: Not on file   Highest education level: Not on file  Occupational History   Not on file  Tobacco Use   Smoking status: Never   Smokeless tobacco: Never  Vaping Use   Vaping Use: Never used  Substance and Sexual Activity   Alcohol use: Not Currently    Comment: occ   Drug use: No   Sexual activity: Not Currently    Birth control/protection: Surgical    Comment: BSO ,INTERCOURSE AGE 38, SEXUAL PARTNERS LESS THAN 5  Other Topics Concern   Not on file  Social History Narrative   Not on file   Social Determinants of Health   Financial Resource Strain: Low Risk  (07/13/2022)   Overall Financial Resource Strain (CARDIA)    Difficulty of Paying Living Expenses: Not hard at all  Food Insecurity: No Food Insecurity (07/13/2022)   Hunger Vital Sign    Worried About Running Out of Food in the Last Year: Never true    Ran Out of Food in the Last Year: Never true  Transportation Needs: No Transportation Needs (07/13/2022)   PRAPARE - Hydrologist (Medical): No    Lack of Transportation (Non-Medical): No  Physical Activity: Sufficiently Active (07/13/2022)   Exercise Vital Sign    Days of Exercise per Week: 4 days    Minutes of Exercise per Session: 60 min  Stress: No Stress Concern Present (07/13/2022)   [place]    Feeling of Stress : Not at all  Social Connections: Moderately Integrated (07/13/2022)   Social Connection and Isolation Panel [NHANES]    Frequency of Communication with Friends and Family: More than three times a week    Frequency of Social Gatherings with Friends and Family: More than three times a  week    Attends Religious Services: More than 4 times per year    Active Member of [employer] or Organizations: Yes    Attends Music therapist: More than 4 times per year    Marital Status: Divorced  Intimate Partner Violence: Not At Risk (07/13/2022)   Humiliation, Afraid, Rape, and Kick questionnaire    Fear of Current or Ex-Partner: No    Emotionally Abused: No    Physically Abused: No    Sexually Abused: No    Tobacco Use: Low Risk  (07/13/2022)   Patient History    Smoking Tobacco Use: Never    Smokeless Tobacco Use: Never    Passive Exposure: Not on file   Social History   Substance and Sexual Activity  Alcohol Use Not Currently   Comment: occ    Family History  Problem Relation Age of Onset   Heart disease Mother    Hyperlipidemia Mother    Stroke Mother    Heart failure Mother    Diabetes Father        type ll   Heart disease Father 61       sudden death age 59   Hyperlipidemia Father    Breast cancer Maternal Aunt    Review of Systems  Constitutional:  Negative for chills and fever.  HENT: Negative.    Eyes: Negative.   Respiratory:  Negative for cough and shortness of breath.   Cardiovascular:  Negative for chest pain and palpitations.  Gastrointestinal:  Negative for abdominal pain, constipation, diarrhea, nausea and vomiting.  Genitourinary:  Negative for dysuria, frequency and urgency.  Musculoskeletal:  Positive for joint pain.  Skin:  Negative for rash.   Objective:  Physical Exam: Well nourished and well developed.  General: Alert and oriented x3, cooperative and pleasant, no acute distress.  Head: normocephalic, atraumatic, neck supple.  Eyes: EOMI.  Abdomen: non-tender to palpation and soft, normoactive bowel sounds. Musculoskeletal: The patient has a significantly antalgic gait pattern with a cane.  Bilateral Hip Exam: The range of motion: normal without discomfort.  Right Knee Exam: No effusion present. No swelling  present. The range of motion is: 0 to 135 degrees. No crepitus on range of motion of the knee. No medial joint line tenderness. No lateral joint line tenderness. The knee is stable.  Left Knee Exam: Significant valgus deformity. No effusion present. No swelling present. The Range of motion is: 5 to 115 degrees. Moderate crepitus on range of motion of the knee. Positive lateral knee tenderness. The knee is stable.  Calves soft and nontender. Motor function intact in LE. Strength 5/5 LE bilaterally. Neuro: Distal pulses 2+. Sensation to light touch intact in LE.  Vital signs in last 24 hours: BP: ()/()  Arterial Line BP: ()/()   Imaging Review Plain radiographs demonstrate moderate degenerative joint disease of the left knee. The overall alignment is significant valgus. The bone quality appears to be adequate for age and reported activity level.  Assessment/Plan:  End stage arthritis, left knee   The patient history, physical examination, clinical judgment of the provider and imaging studies are consistent with end stage degenerative joint disease of the left knee and total knee arthroplasty is deemed medically necessary. The treatment options including medical management, injection therapy arthroscopy and arthroplasty were discussed at length. The risks and benefits of total knee arthroplasty were presented and reviewed. The risks due to aseptic loosening, infection, stiffness, patella tracking problems, thromboembolic complications and other imponderables were discussed. The patient acknowledged the explanation, agreed to proceed with the plan and consent was signed. Patient is being admitted for inpatient treatment for surgery, pain control, PT, OT, prophylactic antibiotics, VTE prophylaxis, progressive ambulation and ADLs and discharge planning. The patient is planning to be discharged  home .  Patient's anticipated LOS is less than 2 midnights, meeting these requirements: - Has a  competent adult at home to recover with post-op - NO history of  - Chronic pain requiring opioids  - Diabetes  - Coronary Artery Disease  - Heart failure  - Heart attack  - Stroke  - DVT/VTE  - Cardiac arrhythmia  - Respiratory Failure/COPD  - Renal failure  - Anemia  - Advanced Liver disease  Therapy Plans: EO Disposition: Home with Daughter Planned  DVT Prophylaxis: Aspirin 325 mg BID DME Needed: None PCP: Carolann Littler, MD (clearance received) TXA: IV Allergies: NKDA Anesthesia Concerns: hard time awakening last TKA surgery BMI: 24.1 Last HgbA1c: not diabetic  [facility]  Other: -very thin skin - careful with drapes  - Patient was instructed on what medications to stop prior to surgery. - Follow-up visit in 2 weeks with Dr. Wynelle Link - Begin physical therapy following surgery - Pre-operative lab work as pre-surgical testing - Prescriptions will be provided in hospital at time of discharge  R. Jaynie Bream, PA-C Orthopedic Surgery [facility] [place]

## 2022-07-21 ENCOUNTER — Encounter: Payer: Self-pay | Admitting: Family Medicine

## 2022-07-21 ENCOUNTER — Ambulatory Visit (INDEPENDENT_AMBULATORY_CARE_PROVIDER_SITE_OTHER): Payer: Medicare HMO | Admitting: Family Medicine

## 2022-07-21 VITALS — BP 170/80 | HR 62 | Temp 98.3°F | Ht 68.0 in | Wt 161.8 lb

## 2022-07-21 DIAGNOSIS — Z01818 Encounter for other preprocedural examination: Secondary | ICD-10-CM

## 2022-07-21 DIAGNOSIS — R030 Elevated blood-pressure reading, without diagnosis of hypertension: Secondary | ICD-10-CM | POA: Diagnosis not present

## 2022-07-21 DIAGNOSIS — Z23 Encounter for immunization: Secondary | ICD-10-CM

## 2022-07-21 MED ORDER — AMLODIPINE BESYLATE 5 MG PO TABS: 5.0000 mg | ORAL_TABLET | Freq: Every day | ORAL | 2 refills | Status: DC

## 2022-07-21 NOTE — Patient Instructions (Signed)
Set up 2 week follow up.   Start the Amlodipine 5 mg once daily.

## 2022-07-21 NOTE — Patient Instructions (Signed)
SURGICAL WAITING ROOM VISITATION Patients having surgery or a procedure may have no more than 2 support people in the waiting area - these visitors may rotate.   Children under the age of 76 must have an adult with them who is not the patient. If the patient needs to stay at the hospital during part of their recovery, the visitor guidelines for inpatient rooms apply. Pre-op nurse will coordinate an appropriate time for 1 support person to accompany patient in pre-op.  This support person may not rotate.    Please refer to the [facility] website for the visitor guidelines for Inpatients (after your surgery is over and you are in a regular room).       Your procedure is scheduled on:  08/09/2022    Report to [facility] Main Entrance    Report to admitting at  1030 AM   Call this number if you have problems the morning of surgery [phone]   Do not eat food :After Midnight.   After Midnight you may have the following liquids until ___ 1000___ AM  DAY OF SURGERY  Water Non-Citrus Juices (without pulp, NO RED) Carbonated Beverages Black Coffee (NO MILK/CREAM OR CREAMERS, sugar ok)  Clear Tea (NO MILK/CREAM OR CREAMERS, sugar ok) regular and decaf                             Plain Jell-O (NO RED)                                           Fruit ices (not with fruit pulp, NO RED)                                     Popsicles (NO RED)                                                               Sports drinks like Gatorade (NO RED)                    The day of surgery:  Drink ONE (1) Pre-Surgery Clear Ensure or G2 at  1000 AM  ( have completed by ) the morning of surgery. Drink in one sitting. Do not sip.  This drink was given to you during your hospital  pre-op appointment visit. Nothing else to drink after completing the  Pre-Surgery Clear Ensure or G2.          If you have questions, please contact your surgeon's office.       Oral Hygiene is also important to  reduce your risk of infection.                                    Remember - BRUSH YOUR TEETH THE MORNING OF SURGERY WITH YOUR REGULAR TOOTHPASTE   Do NOT smoke after Midnight   Take these medicines the morning of surgery with A SIP OF WATER:  zyrtec if needed, flonase  DO NOT TAKE ANY ORAL DIABETIC MEDICATIONS DAY OF YOUR SURGERY  Bring CPAP mask and tubing day of surgery.                              You may not have any metal on your body including hair pins, jewelry, and body piercing             Do not wear make-up, lotions, powders, perfumes/cologne, or deodorant  Do not wear nail polish including gel and S&S, artificial/acrylic nails, or any other type of covering on natural nails including finger and toenails. If you have artificial nails, gel coating, etc. that needs to be removed by a nail salon please have this removed prior to surgery or surgery may need to be canceled/ delayed if the surgeon/ anesthesia feels like they are unable to be safely monitored.   Do not shave  48 hours prior to surgery.               Men may shave face and neck.   Do not bring valuables to the hospital. [place].   Contacts, dentures or bridgework may not be worn into surgery.   Bring small overnight bag day of surgery.   DO NOT [place]. PHARMACY WILL DISPENSE MEDICATIONS LISTED ON YOUR MEDICATION LIST TO YOU DURING YOUR ADMISSION [place]!    Patients discharged on the day of surgery will not be allowed to drive home.  Someone NEEDS to stay with you for the first 24 hours after anesthesia.   Special Instructions: Bring a copy of your healthcare power of attorney and living will documents the day of surgery if you haven't scanned them before.              Please read over the following fact sheets you were given: IF [place] [phone]   If you  received a COVID test during your pre-op visit  it is requested that you wear a mask when out in public, stay away from anyone that may not be feeling well and notify your surgeon if you develop symptoms. If you test positive for Covid or have been in contact with anyone that has tested positive in the last 10 days please notify you surgeon.     [place] - Preparing for Surgery Before surgery, you can play an important role.  Because skin is not sterile, your skin needs to be as free of germs as possible.  You can reduce the number of germs on your skin by washing with CHG (chlorahexidine gluconate) soap before surgery.  CHG is an antiseptic cleaner which kills germs and bonds with the skin to continue killing germs even after washing. Please DO NOT use if you have an allergy to CHG or antibacterial soaps.  If your skin becomes reddened/irritated stop using the CHG and inform your nurse when you arrive at Short Stay. Do not shave (including legs and underarms) for at least 48 hours prior to the first CHG shower.  You may shave your face/neck. Please follow these instructions carefully:  1.  Shower with CHG Soap the night before surgery and the  morning of Surgery.  2.  If you choose to wash your hair, wash your hair first as usual  with your  normal  shampoo.  3.  After you shampoo, rinse your hair and body thoroughly to remove the  shampoo.                           4.  Use CHG as you would any other liquid soap.  You can apply chg directly  to the skin and wash                       Gently with a scrungie or clean washcloth.  5.  Apply the CHG Soap to your body ONLY FROM THE NECK DOWN.   Do not use on face/ open                           Wound or open sores. Avoid contact with eyes, ears mouth and genitals (private parts).                       Wash face,  Genitals (private parts) with your normal soap.             6.  Wash thoroughly, paying special attention to the area where your surgery  will  be performed.  7.  Thoroughly rinse your body with warm water from the neck down.  8.  DO NOT shower/wash with your normal soap after using and rinsing off  the CHG Soap.                9.  Pat yourself dry with a clean towel.            10.  Wear clean pajamas.            11.  Place clean sheets on your bed the night of your first shower and do not  sleep with pets. Day of Surgery : Do not apply any lotions/deodorants the morning of surgery.  Please wear clean clothes to the hospital/surgery center.  FAILURE TO FOLLOW THESE INSTRUCTIONS MAY RESULT IN THE CANCELLATION OF YOUR SURGERY PATIENT SIGNATURE_________________________________  NURSE SIGNATURE__________________________________  ________________________________________________________________________

## 2022-07-21 NOTE — Progress Notes (Signed)
Established Patient Office Visit  Subjective   Patient ID: Megan Reilly, female    DOB: Jan 17, 1941  Age: 81 y.o. MRN: [id number]  Chief Complaint  Patient presents with   Annual Exam    HPI   Megan Reilly is here for preoperative assessment for left total knee replacement and also elevated blood pressures.  She has significant valgus deformity of the left knee and progressive disability over the past year.  She is anxious to get surgery done which will be about 3 weeks from now.  She has no cardiac history.  No chronic lung disease.  Non-smoker.  Stays very active.  Her chronic problems include history of osteoporosis.  She also has history of meningiomas followed by neurosurgery.  Remote history of subarachnoid bleed.  Does not take any anticoagulants.  Past Medical History:  Diagnosis Date   Allergy    Arthritis    Basal cell carcinoma 10/09/2012   right upper arm tx with bx   BCC (basal cell carcinoma of skin) 12/03/2014   right upper arm bcc nod. 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 ; treated in ED , reports no recurrence since then    Past Surgical History:  Procedure Laterality Date   COLONOSCOPY     FOOT SURGERY     bunectomy, hammertoe, bil   LAPAROSCOPIC BILATERAL SALPINGO OOPHERECTOMY     TONSILLECTOMY  10/12/1943   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   Procedure: RIGHT TOTAL KNEE ARTHROPLASTY;  Surgeon: Gaynelle Arabian, MD;  Location: [facility];  Service: Orthopedics;  Laterality: Right;    reports that she has never smoked. She has never used smokeless tobacco. She reports that she does not currently use alcohol. She reports that she does not use drugs. family  history includes Breast cancer in her maternal aunt; Diabetes in her father; Heart disease in her mother; Heart disease (age of onset: 37) in her father; Heart failure in her mother; Hyperlipidemia in her father and mother; Stroke in her mother. Allergies  Allergen Reactions   Hctz [Hydrochlorothiazide] Other (See Comments)    Fatigue, body aches    Review of Systems  Constitutional:  Negative for chills, fever and malaise/fatigue.  Eyes:  Negative for blurred vision.  Respiratory:  Negative for shortness of breath.   Cardiovascular:  Negative for chest pain.  Gastrointestinal:  Negative for abdominal pain.  Genitourinary:  Negative for dysuria.  Musculoskeletal:  Positive for joint pain.  Neurological:  Negative for dizziness, weakness and headaches.      Objective:     BP (!) 170/80 (BP Location: Left Arm, Cuff Size: Normal)   Pulse 62   Temp 98.3 F (36.8 C) (Oral)   Ht '5\' 8"'$  (1.727 m)   Wt 161 lb 12.8 oz (73.4 kg)   SpO2 95%   BMI 24.60 kg/m  BP Readings from Last 3 Encounters:  07/21/22 (!) 170/80  06/22/22 (!) 160/80  03/02/22 110/80   Wt Readings from Last 3 Encounters:  07/21/22 161 lb 12.8 oz (73.4 kg)  07/13/22 158 lb (71.7 kg)  06/22/22 163 lb 6.1 oz (74.1 kg)  Physical Exam Vitals reviewed.  Constitutional:      Appearance: Normal appearance.  HENT:     Right Ear: Tympanic membrane normal.     Left Ear: Tympanic membrane normal.  Cardiovascular:     Rate and Rhythm: Regular rhythm.     Comments: Mild sinus bradycardia with heart rate around 56 Pulmonary:     Effort: Pulmonary effort is normal.     Breath sounds: Normal breath sounds. No wheezing or rales.  Musculoskeletal:     Right lower leg: No edema.     Left lower leg: No edema.  Neurological:     Mental Status: She is alert.      No results found for any visits on 07/21/22.    The ASCVD Risk score (Arnett DK, et al., 2019) failed to calculate for the following reasons:   The  2019 ASCVD risk score is only valid for ages 36 to 79    Assessment & Plan:   #1 preoperative clearance.  Patient had EKG today which shows normal sinus rhythm.  She has mild bradycardia which appears to be chronic.  No evidence for heart block. She did have elevated blood pressure today and has had a couple other recent elevated readings.  See below -No contraindications for surgery at this time.  She is getting preop labs done later this week at the hospital  #2 elevated blood pressure.  We obtained multiple follow-up readings today and consistently getting around 381 systolic over 82X diastolic -Try to keep daily sodium intake less than 2500 mg -Initiate amlodipine 5 mg once daily -Reassess blood pressure here in 2 weeks  Patient requesting COVID-vaccine and this was given Flu vaccine has already been given   Return in about 2 weeks (around 08/04/2022).    Carolann Littler, MD

## 2022-07-22 ENCOUNTER — Other Ambulatory Visit: Payer: Self-pay

## 2022-07-22 ENCOUNTER — Encounter (HOSPITAL_COMMUNITY)
Admission: RE | Admit: 2022-07-22 | Discharge: 2022-07-22 | Disposition: A | Discharge status: Home / Self Care | Payer: Medicare HMO | Source: Ambulatory Visit | Attending: Orthopedic Surgery | Admitting: Orthopedic Surgery

## 2022-07-22 ENCOUNTER — Encounter (HOSPITAL_COMMUNITY): Payer: Self-pay

## 2022-07-22 VITALS — BP 149/89 | HR 65 | Temp 98.3°F | Resp 16 | Ht 68.0 in | Wt 161.6 lb

## 2022-07-22 DIAGNOSIS — Z01818 Encounter for other preprocedural examination: Secondary | ICD-10-CM

## 2022-07-22 DIAGNOSIS — Z01812 Encounter for preprocedural laboratory examination: Secondary | ICD-10-CM | POA: Diagnosis not present

## 2022-07-22 HISTORY — DX: Essential (primary) hypertension: I10

## 2022-07-22 LAB — SURGICAL PCR SCREEN
MRSA, PCR: NEGATIVE
Staphylococcus aureus: NEGATIVE

## 2022-07-22 LAB — CBC
HCT: 41.2 % (ref 36.0–46.0)
Hemoglobin: 13.9 g/dL (ref 12.0–15.0)
MCH: 33.2 pg (ref 26.0–34.0)
MCHC: 33.7 g/dL (ref 30.0–36.0)
MCV: 98.3 fL (ref 80.0–100.0)
Platelets: 166 10*3/uL (ref 150–400)
RBC: 4.19 MIL/uL (ref 3.87–5.11)
RDW: 12 % (ref 11.5–15.5)
WBC: 7.2 10*3/uL (ref 4.0–10.5)
nRBC: 0 % (ref 0.0–0.2)

## 2022-07-22 LAB — BASIC METABOLIC PANEL
Anion gap: 7 (ref 5–15)
BUN: 15 mg/dL (ref 8–23)
CO2: 25 mmol/L (ref 22–32)
Calcium: 10.2 mg/dL (ref 8.9–10.3)
Chloride: 107 mmol/L (ref 98–111)
Creatinine, Ser: 0.63 mg/dL (ref 0.44–1.00)
GFR, Estimated: >60 mL/min (ref >60)
Glucose, Bld: 110 mg/dL — ABNORMAL HIGH (ref 70–99)
Potassium: 4.2 mmol/L (ref 3.5–5.1)
Sodium: 139 mmol/L (ref 135–145)

## 2022-08-04 ENCOUNTER — Ambulatory Visit (INDEPENDENT_AMBULATORY_CARE_PROVIDER_SITE_OTHER): Payer: Medicare HMO | Admitting: Family Medicine

## 2022-08-04 ENCOUNTER — Ambulatory Visit: Payer: Medicare HMO | Admitting: Dermatology

## 2022-08-04 DIAGNOSIS — I10 Essential (primary) hypertension: Secondary | ICD-10-CM | POA: Diagnosis not present

## 2022-08-04 NOTE — Progress Notes (Signed)
Established Patient Office Visit  Subjective   Patient ID: Megan Reilly, female    DOB: 19-May-1941  Age: 81 y.o. MRN: [id number]  Chief Complaint  Patient presents with   Follow-up    HPI   Here for follow-up hypertension.  She was seen couple weeks ago and had reading 170/80.  She had a couple of high readings preoperatively.  We started amlodipine 5 mg daily.  Tolerating well with no edema or headaches.  No dizziness.  Blood pressures have been much improved and around 283 systolic.  She has upcoming planned surgery for total knee replacement on Monday at [facility].  He had recent preoperative labs and CBC was normal.  Electrolytes and renal function stable.  Glucose was 110.  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 She has never used smokeless tobacco. She reports current alcohol use. She reports that she does not use drugs. family history includes Breast cancer in her maternal aunt; Diabetes in her father; Heart disease in her mother; Heart disease (age of onset: 69) in her father; Heart failure in her mother; Hyperlipidemia in her father and mother; Stroke in her mother. Allergies  Allergen Reactions   Hctz  [Hydrochlorothiazide] Other (See Comments)    Fatigue, body aches    Review of Systems  Constitutional:  Negative for malaise/fatigue.  Eyes:  Negative for blurred vision.  Respiratory:  Negative for shortness of breath.   Cardiovascular:  Negative for chest pain.  Neurological:  Negative for dizziness, weakness and headaches.      Objective:     BP (!) 140/68 (BP Location: Left Arm, Cuff Size: Normal)   Pulse 75   Temp 98.1 F (36.7 C) (Oral)   Ht '5\' 8"'$  (1.727 m)   Wt 160 lb 12.8 oz (72.9 kg)   SpO2 98%   BMI 24.45 kg/m  BP Readings from Last 3 Encounters:  08/04/22 (!) 140/68  07/22/22 (!) 149/89  07/21/22 (!) 170/80   Wt Readings from Last 3 Encounters:  08/04/22 160 lb 12.8 oz (72.9 kg)  07/22/22 161 lb 9.6 oz (73.3 kg)  07/21/22 161 lb 12.8 oz (73.4 kg)      Physical Exam Vitals reviewed.  Constitutional:      Appearance: She is well-developed.  Eyes:     Pupils: Pupils are equal, round, and reactive to light.  Neck:     Thyroid: No thyromegaly.     Vascular: No JVD.  Cardiovascular:     Rate and Rhythm: Normal rate and regular rhythm.     Heart sounds:     No gallop.  Pulmonary:     Effort: Pulmonary effort is normal. No respiratory distress.     Breath sounds: Normal breath sounds. No wheezing or rales.  Musculoskeletal:     Cervical back: Neck supple.     Right lower leg: No edema.     Left lower leg: No edema.  Neurological:     Mental Status: She is alert.      No results found for any visits on 08/04/22.  Last CBC Lab Results  Component Value Date   WBC 7.2 07/22/2022   HGB 13.9 07/22/2022   HCT 41.2 07/22/2022   MCV 98.3 07/22/2022   MCH 33.2 07/22/2022   RDW 12.0 07/22/2022   PLT 166 09/81/1914   Last metabolic panel Lab Results  Component Value Date   GLUCOSE 110 (H) 07/22/2022   NA 139 07/22/2022   K 4.2 07/22/2022   CL 107 07/22/2022   CO2 25 07/22/2022   BUN 15 07/22/2022   CREATININE 0.63 07/22/2022   GFRNONAA >60  07/22/2022   CALCIUM 10.2 07/22/2022   PHOS 2.9 12/10/2013   PROT 6.4 10/24/2020   ALBUMIN 4.6 10/24/2020   BILITOT 0.9 10/24/2020   ALKPHOS 55 10/24/2020   AST 20 10/24/2020   ALT 15 10/24/2020   ANIONGAP 7 07/22/2022   Last lipids Lab Results  Component Value Date   CHOL 194 10/24/2020   HDL 67.10 10/24/2020   LDLCALC 108 (H) 10/24/2020   LDLDIRECT 139.3 02/02/2011   TRIG 95.0 10/24/2020   CHOLHDL 3 10/24/2020      The ASCVD Risk score (Arnett DK, et al., 2019) failed to calculate for the following reasons:   The 2019 ASCVD risk score is only valid for ages 17 to 65    Assessment & Plan:   Problem List Items Addressed This Visit       Unprioritized   Essential hypertension  Significantly improved after addition of amlodipine 5 mg daily.  Patient tolerating well.  Continue amlodipine 5 mg daily.  Continue sodium reduction with less than 2500 milligrams daily.  -Set up 21-monthfollow-up -Consider A1c at follow-up  Return in about 2 months (around 10/04/2022).    BCarolann Littler MD

## 2022-08-04 NOTE — Patient Instructions (Signed)
Blood pressure is improved on the Amlodipine  Let's set up 2 month follow up

## 2022-08-09 ENCOUNTER — Ambulatory Visit (HOSPITAL_BASED_OUTPATIENT_CLINIC_OR_DEPARTMENT_OTHER): Payer: Medicare HMO | Admitting: Anesthesiology

## 2022-08-09 ENCOUNTER — Ambulatory Visit (HOSPITAL_COMMUNITY): Payer: Medicare HMO | Admitting: Physician Assistant

## 2022-08-09 ENCOUNTER — Encounter (HOSPITAL_COMMUNITY)
Admission: RE | Disposition: A | Discharge status: Home / Self Care | Payer: Self-pay | Source: Home / Self Care | Attending: Orthopedic Surgery

## 2022-08-09 ENCOUNTER — Observation Stay (HOSPITAL_COMMUNITY)
Admission: RE | Admit: 2022-08-09 | Discharge: 2022-08-10 | Disposition: A | Discharge status: Home / Self Care | Payer: Medicare HMO | Attending: Orthopedic Surgery | Admitting: Orthopedic Surgery

## 2022-08-09 ENCOUNTER — Other Ambulatory Visit: Payer: Self-pay

## 2022-08-09 ENCOUNTER — Encounter (HOSPITAL_COMMUNITY): Payer: Self-pay | Admitting: Orthopedic Surgery

## 2022-08-09 DIAGNOSIS — Z85828 Personal history of other malignant neoplasm of skin: Secondary | ICD-10-CM | POA: Diagnosis not present

## 2022-08-09 DIAGNOSIS — Z79899 Other long term (current) drug therapy: Secondary | ICD-10-CM | POA: Insufficient documentation

## 2022-08-09 DIAGNOSIS — M1712 Unilateral primary osteoarthritis, left knee: Secondary | ICD-10-CM

## 2022-08-09 DIAGNOSIS — Z96651 Presence of right artificial knee joint: Secondary | ICD-10-CM | POA: Diagnosis not present

## 2022-08-09 DIAGNOSIS — I10 Essential (primary) hypertension: Secondary | ICD-10-CM | POA: Insufficient documentation

## 2022-08-09 DIAGNOSIS — Z01818 Encounter for other preprocedural examination: Secondary | ICD-10-CM

## 2022-08-09 DIAGNOSIS — G8918 Other acute postprocedural pain: Secondary | ICD-10-CM | POA: Diagnosis not present

## 2022-08-09 HISTORY — PX: TOTAL KNEE ARTHROPLASTY: SHX125

## 2022-08-09 SURGERY — ARTHROPLASTY, KNEE, TOTAL
Anesthesia: Spinal | Site: Knee | Laterality: Left

## 2022-08-09 MED ORDER — BUPIVACAINE LIPOSOME 1.3 % IJ SUSP
INTRAMUSCULAR | Status: AC
  Filled 2022-08-09: qty 20

## 2022-08-09 MED ORDER — DEXAMETHASONE SODIUM PHOSPHATE 10 MG/ML IJ SOLN
8.0000 mg | Freq: Once | INTRAMUSCULAR | Status: AC
  Administered 2022-08-09: 8 mg via INTRAVENOUS

## 2022-08-09 MED ORDER — CEFAZOLIN SODIUM-DEXTROSE 2-4 GM/100ML-% IV SOLN
2.0000 g | INTRAVENOUS | Status: AC
  Administered 2022-08-09: 2 g via INTRAVENOUS
  Filled 2022-08-09: qty 100

## 2022-08-09 MED ORDER — SODIUM CHLORIDE 0.9 % IR SOLN
Status: DC | PRN
  Administered 2022-08-09 (×2): 1000 mL

## 2022-08-09 MED ORDER — ORAL CARE MOUTH RINSE: 15.0000 mL | OROMUCOSAL | Status: DC | PRN

## 2022-08-09 MED ORDER — BISACODYL 10 MG RE SUPP: 10.0000 mg | Freq: Every day | RECTAL | Status: DC | PRN

## 2022-08-09 MED ORDER — MORPHINE SULFATE (PF) 2 MG/ML IV SOLN: 1.0000 mg | INTRAVENOUS | Status: DC | PRN

## 2022-08-09 MED ORDER — FLEET ENEMA 7-19 GM/118ML RE ENEM: 1.0000 | ENEMA | Freq: Once | RECTAL | Status: DC | PRN

## 2022-08-09 MED ORDER — DIPHENHYDRAMINE HCL 12.5 MG/5ML PO ELIX
12.5000 mg | ORAL_SOLUTION | ORAL | Status: DC | PRN
  Administered 2022-08-10: 12.5 mg via ORAL
  Filled 2022-08-09: qty 10

## 2022-08-09 MED ORDER — METOCLOPRAMIDE HCL 5 MG PO TABS: 5.0000 mg | ORAL_TABLET | Freq: Three times a day (TID) | ORAL | Status: DC | PRN

## 2022-08-09 MED ORDER — CHLORHEXIDINE GLUCONATE 0.12 % MT SOLN
15.0000 mL | Freq: Once | OROMUCOSAL | Status: AC
  Administered 2022-08-09: 15 mL via OROMUCOSAL

## 2022-08-09 MED ORDER — OXYCODONE HCL 5 MG PO TABS: 5.0000 mg | ORAL_TABLET | ORAL | Status: DC | PRN

## 2022-08-09 MED ORDER — PROPOFOL 500 MG/50ML IV EMUL
INTRAVENOUS | Status: DC | PRN
  Administered 2022-08-09: 80 ug/kg/min via INTRAVENOUS

## 2022-08-09 MED ORDER — ONDANSETRON HCL 4 MG PO TABS: 4.0000 mg | ORAL_TABLET | Freq: Four times a day (QID) | ORAL | Status: DC | PRN

## 2022-08-09 MED ORDER — FENTANYL CITRATE PF 50 MCG/ML IJ SOSY: 50.0000 ug | PREFILLED_SYRINGE | INTRAMUSCULAR | Status: AC

## 2022-08-09 MED ORDER — POLYETHYLENE GLYCOL 3350 17 G PO PACK: 17.0000 g | PACK | Freq: Every day | ORAL | Status: DC | PRN

## 2022-08-09 MED ORDER — POVIDONE-IODINE 10 % EX SWAB
2.0000 | Freq: Once | CUTANEOUS | Status: AC
  Administered 2022-08-09: 2 via TOPICAL

## 2022-08-09 MED ORDER — TRAMADOL HCL 50 MG PO TABS
50.0000 mg | ORAL_TABLET | Freq: Four times a day (QID) | ORAL | Status: DC | PRN
  Administered 2022-08-09 – 2022-08-10 (×2): 100 mg via ORAL
  Filled 2022-08-09 (×2): qty 2

## 2022-08-09 MED ORDER — ONDANSETRON HCL 4 MG/2ML IJ SOLN
INTRAMUSCULAR | Status: DC | PRN
  Administered 2022-08-09: 4 mg via INTRAVENOUS

## 2022-08-09 MED ORDER — DEXAMETHASONE SODIUM PHOSPHATE 10 MG/ML IJ SOLN
INTRAMUSCULAR | Status: AC
  Filled 2022-08-09: qty 1

## 2022-08-09 MED ORDER — METHOCARBAMOL 500 MG IVPB - SIMPLE MED: 500.0000 mg | Freq: Four times a day (QID) | INTRAVENOUS | Status: DC | PRN

## 2022-08-09 MED ORDER — BUPIVACAINE LIPOSOME 1.3 % IJ SUSP: 20.0000 mL | Freq: Once | INTRAMUSCULAR | Status: AC

## 2022-08-09 MED ORDER — FENTANYL CITRATE PF 50 MCG/ML IJ SOSY
PREFILLED_SYRINGE | INTRAMUSCULAR | Status: AC
  Administered 2022-08-09: 50 ug via INTRAVENOUS
  Filled 2022-08-09: qty 2

## 2022-08-09 MED ORDER — SODIUM CHLORIDE (PF) 0.9 % IJ SOLN
INTRAMUSCULAR | Status: AC
  Filled 2022-08-09: qty 50

## 2022-08-09 MED ORDER — ACETAMINOPHEN 10 MG/ML IV SOLN
1000.0000 mg | Freq: Four times a day (QID) | INTRAVENOUS | Status: DC
  Administered 2022-08-09: 1000 mg via INTRAVENOUS
  Filled 2022-08-09: qty 100

## 2022-08-09 MED ORDER — BUPIVACAINE IN DEXTROSE 0.75-8.25 % IT SOLN
INTRATHECAL | Status: DC | PRN
  Administered 2022-08-09: 1.6 mL via INTRATHECAL

## 2022-08-09 MED ORDER — ORAL CARE MOUTH RINSE: 15.0000 mL | Freq: Once | OROMUCOSAL | Status: AC

## 2022-08-09 MED ORDER — CEFAZOLIN SODIUM-DEXTROSE 2-4 GM/100ML-% IV SOLN
2.0000 g | Freq: Four times a day (QID) | INTRAVENOUS | Status: AC
  Administered 2022-08-09 (×2): 2 g via INTRAVENOUS
  Filled 2022-08-09 (×2): qty 100

## 2022-08-09 MED ORDER — PHENOL 1.4 % MT LIQD: 1.0000 | OROMUCOSAL | Status: DC | PRN

## 2022-08-09 MED ORDER — LATANOPROST 0.005 % OP SOLN
1.0000 [drp] | Freq: Every day | OPHTHALMIC | Status: DC
  Administered 2022-08-09: 1 [drp] via OPHTHALMIC
  Filled 2022-08-09: qty 2.5

## 2022-08-09 MED ORDER — TRANEXAMIC ACID-NACL 1000-0.7 MG/100ML-% IV SOLN
1000.0000 mg | INTRAVENOUS | Status: AC
  Administered 2022-08-09: 1000 mg via INTRAVENOUS
  Filled 2022-08-09: qty 100

## 2022-08-09 MED ORDER — PROPOFOL 10 MG/ML IV BOLUS
INTRAVENOUS | Status: AC
  Filled 2022-08-09: qty 20

## 2022-08-09 MED ORDER — LACTATED RINGERS IV SOLN: INTRAVENOUS | Status: DC

## 2022-08-09 MED ORDER — ASPIRIN 325 MG PO TBEC
325.0000 mg | DELAYED_RELEASE_TABLET | Freq: Two times a day (BID) | ORAL | Status: DC
  Administered 2022-08-10: 325 mg via ORAL
  Filled 2022-08-09: qty 1

## 2022-08-09 MED ORDER — METHOCARBAMOL 500 MG PO TABS
500.0000 mg | ORAL_TABLET | Freq: Four times a day (QID) | ORAL | Status: DC | PRN
  Administered 2022-08-10: 500 mg via ORAL
  Filled 2022-08-09: qty 1

## 2022-08-09 MED ORDER — BUPIVACAINE-EPINEPHRINE (PF) 0.5% -1:200000 IJ SOLN
INTRAMUSCULAR | Status: DC | PRN
  Administered 2022-08-09: 20 mL via PERINEURAL

## 2022-08-09 MED ORDER — PROPOFOL 10 MG/ML IV BOLUS
INTRAVENOUS | Status: DC | PRN
  Administered 2022-08-09 (×3): 10 mg via INTRAVENOUS

## 2022-08-09 MED ORDER — DEXAMETHASONE SODIUM PHOSPHATE 10 MG/ML IJ SOLN
10.0000 mg | Freq: Once | INTRAMUSCULAR | Status: AC
  Administered 2022-08-10: 10 mg via INTRAVENOUS
  Filled 2022-08-09: qty 1

## 2022-08-09 MED ORDER — PHENYLEPHRINE HCL-NACL 20-0.9 MG/250ML-% IV SOLN
INTRAVENOUS | Status: DC | PRN
  Administered 2022-08-09: 50 ug/min via INTRAVENOUS

## 2022-08-09 MED ORDER — SODIUM CHLORIDE 0.9 % IV SOLN: INTRAVENOUS | Status: DC

## 2022-08-09 MED ORDER — ONDANSETRON HCL 4 MG/2ML IJ SOLN
INTRAMUSCULAR | Status: AC
  Filled 2022-08-09: qty 2

## 2022-08-09 MED ORDER — METOCLOPRAMIDE HCL 5 MG/ML IJ SOLN: 5.0000 mg | Freq: Three times a day (TID) | INTRAMUSCULAR | Status: DC | PRN

## 2022-08-09 MED ORDER — ACETAMINOPHEN 500 MG PO TABS
1000.0000 mg | ORAL_TABLET | Freq: Four times a day (QID) | ORAL | Status: DC
  Administered 2022-08-09 – 2022-08-10 (×3): 1000 mg via ORAL
  Filled 2022-08-09 (×3): qty 2

## 2022-08-09 MED ORDER — DOCUSATE SODIUM 100 MG PO CAPS
100.0000 mg | ORAL_CAPSULE | Freq: Two times a day (BID) | ORAL | Status: DC
  Administered 2022-08-09 – 2022-08-10 (×2): 100 mg via ORAL
  Filled 2022-08-09 (×2): qty 1

## 2022-08-09 MED ORDER — SODIUM CHLORIDE (PF) 0.9 % IJ SOLN
INTRAMUSCULAR | Status: AC
  Filled 2022-08-09: qty 10

## 2022-08-09 MED ORDER — AMLODIPINE BESYLATE 5 MG PO TABS: 5.0000 mg | ORAL_TABLET | Freq: Every day | ORAL | Status: DC

## 2022-08-09 MED ORDER — MENTHOL 3 MG MT LOZG: 1.0000 | LOZENGE | OROMUCOSAL | Status: DC | PRN

## 2022-08-09 MED ORDER — SODIUM CHLORIDE 0.9 % IV SOLN
INTRAVENOUS | Status: DC | PRN
  Administered 2022-08-09: 80 mL

## 2022-08-09 MED ORDER — ONDANSETRON HCL 4 MG/2ML IJ SOLN: 4.0000 mg | Freq: Four times a day (QID) | INTRAMUSCULAR | Status: DC | PRN

## 2022-08-09 SURGICAL SUPPLY — 56 items
ATTUNE MED DOME PAT 38 KNEE (Knees) IMPLANT
ATTUNE PS FEM LT SZ 6 CEM KNEE (Femur) IMPLANT
ATTUNE PSRP INSR SZ6 7 KNEE (Insert) IMPLANT
BAG COUNTER SPONGE SURGICOUNT (BAG) IMPLANT
BAG SPEC THK2 15X12 ZIP CLS (MISCELLANEOUS) ×1
BAG SPNG CNTER NS LX DISP (BAG) ×1
BAG ZIPLOCK 12X15 (MISCELLANEOUS) ×2 IMPLANT
BASE TIBIA ATTUNE KNEE SYS SZ6 (Knees) IMPLANT
BLADE SAG 18X100X1.27 (BLADE) ×2 IMPLANT
BLADE SAW SGTL 11.0X1.19X90.0M (BLADE) ×2 IMPLANT
BNDG ELASTIC 6X5.8 VLCR STR LF (GAUZE/BANDAGES/DRESSINGS) ×2 IMPLANT
BOWL SMART MIX CTS (DISPOSABLE) ×2 IMPLANT
BSPLAT TIB 6 CMNT ROT PLAT STR (Knees) ×1 IMPLANT
CEMENT HV SMART SET (Cement) ×4 IMPLANT
COVER SURGICAL LIGHT HANDLE (MISCELLANEOUS) ×2 IMPLANT
CUFF TOURN SGL QUICK 34 (TOURNIQUET CUFF) ×1
CUFF TRNQT CYL 34X4.125X (TOURNIQUET CUFF) ×2 IMPLANT
DRAPE INCISE IOBAN 66X45 STRL (DRAPES) ×2 IMPLANT
DRAPE U-SHAPE 47X51 STRL (DRAPES) ×2 IMPLANT
DRSG AQUACEL AG ADV 3.5X10 (GAUZE/BANDAGES/DRESSINGS) ×2 IMPLANT
DURAPREP 26ML APPLICATOR (WOUND CARE) ×2 IMPLANT
ELECT REM PT RETURN 15FT ADLT (MISCELLANEOUS) ×2 IMPLANT
GLOVE BIO SURGEON STRL SZ 6.5 (GLOVE) IMPLANT
GLOVE BIO SURGEON STRL SZ7.5 (GLOVE) IMPLANT
GLOVE BIO SURGEON STRL SZ8 (GLOVE) ×2 IMPLANT
GLOVE BIOGEL PI IND STRL 6.5 (GLOVE) IMPLANT
GLOVE BIOGEL PI IND STRL 7.0 (GLOVE) IMPLANT
GLOVE BIOGEL PI IND STRL 8 (GLOVE) ×2 IMPLANT
GOWN STRL REUS W/ TWL LRG LVL3 (GOWN DISPOSABLE) ×2 IMPLANT
GOWN STRL REUS W/ TWL XL LVL3 (GOWN DISPOSABLE) IMPLANT
GOWN STRL REUS W/TWL LRG LVL3 (GOWN DISPOSABLE) ×1
GOWN STRL REUS W/TWL XL LVL3 (GOWN DISPOSABLE)
HANDPIECE INTERPULSE COAX TIP (DISPOSABLE) ×1
HOLDER FOLEY CATH W/STRAP (MISCELLANEOUS) IMPLANT
IMMOBILIZER KNEE 20 (SOFTGOODS) ×1
IMMOBILIZER KNEE 20 THIGH 36 (SOFTGOODS) ×2 IMPLANT
KIT TURNOVER KIT A (KITS) IMPLANT
MANIFOLD NEPTUNE II (INSTRUMENTS) ×2 IMPLANT
NS IRRIG 1000ML POUR BTL (IV SOLUTION) ×2 IMPLANT
PACK TOTAL KNEE CUSTOM (KITS) ×2 IMPLANT
PADDING CAST COTTON 6X4 STRL (CAST SUPPLIES) ×4 IMPLANT
PROTECTOR NERVE ULNAR (MISCELLANEOUS) ×2 IMPLANT
SET HNDPC FAN SPRY TIP SCT (DISPOSABLE) ×2 IMPLANT
SPIKE FLUID TRANSFER (MISCELLANEOUS) ×2 IMPLANT
STRIP CLOSURE SKIN 1/2X4 (GAUZE/BANDAGES/DRESSINGS) ×4 IMPLANT
SUT MNCRL AB 4-0 PS2 18 (SUTURE) ×2 IMPLANT
SUT STRATAFIX 0 PDS 27 VIOLET (SUTURE) ×1
SUT VIC AB 2-0 CT1 27 (SUTURE) ×3
SUT VIC AB 2-0 CT1 TAPERPNT 27 (SUTURE) ×6 IMPLANT
SUTURE STRATFX 0 PDS 27 VIOLET (SUTURE) ×2 IMPLANT
TIBIA ATTUNE KNEE SYS BASE SZ6 (Knees) ×1 IMPLANT
TRAY FOLEY MTR SLVR 14FR STAT (SET/KITS/TRAYS/PACK) IMPLANT
TRAY FOLEY MTR SLVR 16FR STAT (SET/KITS/TRAYS/PACK) ×2 IMPLANT
TUBE SUCTION HIGH CAP CLEAR NV (SUCTIONS) ×2 IMPLANT
WATER STERILE IRR 1000ML POUR (IV SOLUTION) ×4 IMPLANT
WRAP KNEE MAXI GEL POST OP (GAUZE/BANDAGES/DRESSINGS) ×2 IMPLANT

## 2022-08-09 NOTE — Progress Notes (Signed)
Orthopedic Tech Progress Note Patient Details:  Megan Reilly [birth year] [id number]  CPM Left Knee CPM Left Knee: On Left Knee Flexion (Degrees): 40 Left Knee Extension (Degrees): 10  Post Interventions Patient Tolerated: Well Instructions Provided: Adjustment of device, Care of device  Arville Go 08/09/2022, 3:59 PM

## 2022-08-09 NOTE — Anesthesia Procedure Notes (Signed)
Anesthesia Regional Block: Adductor canal block   Pre-Anesthetic Checklist: , timeout performed,  Correct Patient, Correct Site, Correct Laterality,  Correct Procedure, Correct Position, site marked,  Risks and benefits discussed,  Pre-op evaluation,  At surgeon's request and post-op pain management  Laterality: Left  Prep: Maximum Sterile Barrier Precautions used, chloraprep       Needles:  Injection technique: Single-shot  Needle Type: Echogenic Stimulator Needle     Needle Length: 9cm  Needle Gauge: 21     Additional Needles:   Procedures:,,,, ultrasound used (permanent image in chart),,    Narrative:  Start time: 08/09/2022 11:29 AM End time: 08/09/2022 11:39 AM Injection made incrementally with aspirations every 5 mL.  Performed by: Personally  Anesthesiologist: Roderic Palau, MD  Additional Notes:

## 2022-08-09 NOTE — Anesthesia Procedure Notes (Signed)
Spinal  Patient location during procedure: OR Start time: 08/09/2022 12:30 PM End time: 08/09/2022 12:33 PM Reason for block: surgical anesthesia Staffing Performed: resident/CRNA  Anesthesiologist: Roderic Palau, MD Resident/CRNA: Lind Covert, CRNA Performed by: Lind Covert, CRNA Authorized by: Roderic Palau, MD   Preanesthetic Checklist Completed: patient identified, IV checked, site marked, risks and benefits discussed, surgical consent, monitors and equipment checked, pre-op evaluation and timeout performed Spinal Block Patient position: sitting Prep: DuraPrep Patient monitoring: cardiac monitor, heart rate, continuous pulse ox and blood pressure Approach: midline Location: L3-4 Injection technique: single-shot Needle Needle type: Pencan  Needle gauge: 24 G Needle length: 10 cm Needle insertion depth: 7 cm Assessment Sensory level: T6 Events: CSF return Additional Notes Timeout performed. Patient in sitting position. L3-4 identified. Cleansed with Duraprep. SAB without difficulty. To supine position.

## 2022-08-09 NOTE — Progress Notes (Signed)
Orthopedic Tech Progress Note Patient Details:  Megan Reilly 1941/03/27 [id number]  PT removed CPM and pt stated they wanted to keep it off until the morning.   Patient ID: Megan Reilly, female   DOB: 1941-04-18, 81 y.o.   MRN: [id number]  Megan Reilly 08/09/2022, 9:17 PM

## 2022-08-09 NOTE — Interval H&P Note (Signed)
History and Physical Interval Note:  08/09/2022 11:24 AM  Megan Reilly  has presented today for surgery, with the diagnosis of left knee osteoarthritis.  The various methods of treatment have been discussed with the patient and family. After consideration of risks, benefits and other options for treatment, the patient has consented to  Procedure(s): TOTAL KNEE ARTHROPLASTY (Left) as a surgical intervention.  The patient's history has been reviewed, patient examined, no change in status, stable for surgery.  I have reviewed the patient's chart and labs.  Questions were answered to the patient's satisfaction.     Pilar Plate Seth Higginbotham

## 2022-08-09 NOTE — Plan of Care (Signed)
  Problem: Education: Goal: Knowledge of the prescribed therapeutic regimen will improve Outcome: Progressing   Problem: Activity: Goal: Range of joint motion will improve Outcome: Progressing   Problem: Pain Management: Goal: Pain level will decrease with appropriate interventions Outcome: Progressing   Problem: Safety: Goal: Ability to remain free from injury will improve Outcome: Progressing   

## 2022-08-09 NOTE — Transfer of Care (Signed)
Immediate Anesthesia Transfer of Care Note  Patient: Ishanvi Mcquitty  Procedure(s) Performed: TOTAL KNEE ARTHROPLASTY (Left: Knee)  Patient Location: PACU  Anesthesia Type:Spinal  Level of Consciousness: sedated  Airway & Oxygen Therapy: Patient Spontanous Breathing and Patient connected to face mask oxygen  Post-op Assessment: Report given to RN and Post -op Vital signs reviewed and stable  Post vital signs: Reviewed and stable  Last Vitals:  Vitals Value Taken Time  BP 110/61 08/09/22 1409  Temp    Pulse 73 08/09/22 1411  Resp 18 08/09/22 1411  SpO2 97 % 08/09/22 1411  Vitals shown include unvalidated device data.  Last Pain:  Vitals:   08/09/22 1059  TempSrc: Oral  PainSc:       Patients Stated Pain Goal: 4 (99/14/44 5848)  Complications: No notable events documented.

## 2022-08-09 NOTE — Anesthesia Preprocedure Evaluation (Addendum)
Anesthesia Evaluation  Patient identified by MRN, date of birth, ID band Patient awake    Reviewed: Allergy & Precautions, H&P , NPO status , Patient's Chart, lab work & pertinent test results  Airway Mallampati: II  TM Distance: >3 FB Neck ROM: Full    Dental no notable dental hx. (+) Teeth Intact, Dental Advisory Given   Pulmonary neg pulmonary ROS,    Pulmonary exam normal breath sounds clear to auscultation       Cardiovascular hypertension, Pt. on medications  Rhythm:Regular Rate:Normal     Neuro/Psych  Headaches, negative psych ROS   GI/Hepatic negative GI ROS, Neg liver ROS,   Endo/Other  negative endocrine ROS  Renal/GU negative Renal ROS  negative genitourinary   Musculoskeletal  (+) Arthritis , Osteoarthritis,    Abdominal   Peds  Hematology negative hematology ROS (+)   Anesthesia Other Findings   Reproductive/Obstetrics negative OB ROS                            Anesthesia Physical Anesthesia Plan  ASA: 2  Anesthesia Plan: Spinal   Post-op Pain Management: Regional block* and Ofirmev IV (intra-op)*   Induction: Intravenous  PONV Risk Score and Plan: 3 and Propofol infusion and Ondansetron  Airway Management Planned: Natural Airway and Simple Face Mask  Additional Equipment:   Intra-op Plan:   Post-operative Plan:   Informed Consent: I have reviewed the patients History and Physical, chart, labs and discussed the procedure including the risks, benefits and alternatives for the proposed anesthesia with the patient or authorized representative who has indicated his/her understanding and acceptance.     Dental advisory given  Plan Discussed with: CRNA  Anesthesia Plan Comments:         Anesthesia Quick Evaluation

## 2022-08-09 NOTE — Care Plan (Signed)
Ortho Bundle Case Management Note  Patient Details  Name: Megan Reilly MRN: [id number] Date of Birth: November 20, 1940                   L TKA on 08-09-22 DCP: Home with dtr DME: No needs, has a RW PT: EO on 08-12-22   DME Arranged:  N/A DME Agency:     HH Arranged:    [place] Agency:     Additional Comments: Please contact me with any questions of if this plan should need to change.  Marianne Sofia, RN,CCM [facility]  [phone] 08/09/2022, 4:20 PM

## 2022-08-09 NOTE — Discharge Instructions (Signed)
 Megan Aluisio, MD Total Joint Specialist [facility] [address] [place], [place] [address] [phone]  TOTAL KNEE REPLACEMENT POSTOPERATIVE DIRECTIONS    Knee Rehabilitation, Guidelines Following Surgery  Results after knee surgery are often greatly improved when you follow the exercise, range of motion and muscle strengthening exercises prescribed by your doctor. Safety measures are also important to protect the knee from further injury. If any of these exercises cause you to have increased pain or swelling in your knee joint, decrease the amount until you are comfortable again and slowly increase them. If you have problems or questions, call your caregiver or physical therapist for advice.   BLOOD CLOT PREVENTION Take a 325 mg Aspirin two times a day for three weeks following surgery. Then take an 81 mg Aspirin once a day for three weeks. Then discontinue Aspirin. You may resume your vitamins/supplements upon discharge from the hospital. Do not take any NSAIDs (Advil, Aleve, Ibuprofen, Meloxicam, etc.) until you have discontinued the 325 mg Aspirin.  HOME CARE INSTRUCTIONS  Remove items at home which could result in a fall. This includes throw rugs or furniture in walking pathways.  ICE to the affected knee as much as tolerated. Icing helps control swelling. If the swelling is well controlled you will be more comfortable and rehab easier. Continue to use ice on the knee for pain and swelling from surgery. You may notice swelling that will progress down to the foot and ankle. This is normal after surgery. Elevate the leg when you are not up walking on it.    Continue to use the breathing machine which will help keep your temperature down. It is common for your temperature to cycle up and down following surgery, especially at night when you are not up moving around and exerting yourself. The breathing machine keeps your lungs expanded and your temperature  down. Do not place pillow under the operative knee, focus on keeping the knee straight while resting  DIET You may resume your previous home diet once you are discharged from the hospital.  DRESSING / WOUND CARE / SHOWERING Keep your bulky bandage on for 2 days. On the third post-operative day you may remove the Ace bandage and gauze. There is a waterproof adhesive bandage on your skin which will stay in place until your first follow-up appointment. Once you remove this you will not need to place another bandage You may begin showering 3 days following surgery, but do not submerge the incision under water.  ACTIVITY For the first 5 days, the key is rest and control of pain and swelling Do your home exercises twice a day starting on post-operative day 3. On the days you go to physical therapy, just do the home exercises once that day. You should rest, ice and elevate the leg for 50 minutes out of every hour. Get up and walk/stretch for 10 minutes per hour. After 5 days you can increase your activity slowly as tolerated. Walk with your walker as instructed. Use the walker until you are comfortable transitioning to a cane. Walk with the cane in the opposite hand of the operative leg. You may discontinue the cane once you are comfortable and walking steadily. Avoid periods of inactivity such as sitting longer than an hour when not asleep. This helps prevent blood clots.  You may discontinue the knee immobilizer once you are able to perform a straight leg raise while lying down. You may resume a sexual relationship in one month   or when given the OK by your doctor.  You may return to work once you are cleared by your doctor.  Do not drive a car for 6 weeks or until released by your surgeon.  Do not drive while taking narcotics.  TED HOSE STOCKINGS Wear the elastic stockings on both legs for three weeks following surgery during the day. You may remove them at night for sleeping.  WEIGHT  BEARING Weight bearing as tolerated with assist device (walker, cane, etc) as directed, use it as long as suggested by your surgeon or therapist, typically at least 4-6 weeks.  POSTOPERATIVE CONSTIPATION PROTOCOL Constipation - defined medically as fewer than three stools per week and severe constipation as less than one stool per week.  One of the most common issues patients have following surgery is constipation.  Even if you have a regular bowel pattern at home, your normal regimen is likely to be disrupted due to multiple reasons following surgery.  Combination of anesthesia, postoperative narcotics, change in appetite and fluid intake all can affect your bowels.  In order to avoid complications following surgery, here are some recommendations in order to help you during your recovery period.  Colace (docusate) - Pick up an over-the-counter form of Colace or another stool softener and take twice a day as long as you are requiring postoperative pain medications.  Take with a full glass of water daily.  If you experience loose stools or diarrhea, hold the colace until you stool forms back up. If your symptoms do not get better within 1 week or if they get worse, check with your doctor. Dulcolax (bisacodyl) - Pick up over-the-counter and take as directed by the product packaging as needed to assist with the movement of your bowels.  Take with a full glass of water.  Use this product as needed if not relieved by Colace only.  MiraLax (polyethylene glycol) - Pick up over-the-counter to have on hand. MiraLax is a solution that will increase the amount of water in your bowels to assist with bowel movements.  Take as directed and can mix with a glass of water, juice, soda, coffee, or tea. Take if you go more than two days without a movement. Do not use MiraLax more than once per day. Call your doctor if you are still constipated or irregular after using this medication for 7 days in a row.  If you continue  to have problems with postoperative constipation, please contact the office for further assistance and recommendations.  If you experience "the worst abdominal pain ever" or develop nausea or vomiting, please contact the office immediatly for further recommendations for treatment.  ITCHING If you experience itching with your medications, try taking only a single pain pill, or even half a pain pill at a time.  You can also use Benadryl over the counter for itching or also to help with sleep.   MEDICATIONS See your medication summary on the "After Visit Summary" that the nursing staff will review with you prior to discharge.  You may have some home medications which will be placed on hold until you complete the course of blood thinner medication.  It is important for you to complete the blood thinner medication as prescribed by your surgeon.  Continue your approved medications as instructed at time of discharge.  PRECAUTIONS If you experience chest pain or shortness of breath - call 911 immediately for transfer to the hospital emergency department.  If you develop a fever greater that 101 F,   purulent drainage from wound, increased redness or drainage from wound, foul odor from the wound/dressing, or calf pain - CONTACT YOUR SURGEON.                                                   FOLLOW-UP APPOINTMENTS Make sure you keep all of your appointments after your operation with your surgeon and caregivers. You should call the office at the above phone number and make an appointment for approximately two weeks after the date of your surgery or on the date instructed by your surgeon outlined in the "After Visit Summary".  RANGE OF MOTION AND STRENGTHENING EXERCISES  Rehabilitation of the knee is important following a knee injury or an operation. After just a few days of immobilization, the muscles of the thigh which control the knee become weakened and shrink (atrophy). Knee exercises are designed to build up  the tone and strength of the thigh muscles and to improve knee motion. Often times heat used for twenty to thirty minutes before working out will loosen up your tissues and help with improving the range of motion but do not use heat for the first two weeks following surgery. These exercises can be done on a training (exercise) mat, on the floor, on a table or on a bed. Use what ever works the best and is most comfortable for you Knee exercises include:  Leg Lifts - While your knee is still immobilized in a splint or cast, you can do straight leg raises. Lift the leg to 60 degrees, hold for 3 sec, and slowly lower the leg. Repeat 10-20 times 2-3 times daily. Perform this exercise against resistance later as your knee gets better.  Quad and Hamstring Sets - Tighten up the muscle on the front of the thigh (Quad) and hold for 5-10 sec. Repeat this 10-20 times hourly. Hamstring sets are done by pushing the foot backward against an object and holding for 5-10 sec. Repeat as with quad sets.  Leg Slides: Lying on your back, slowly slide your foot toward your buttocks, bending your knee up off the floor (only go as far as is comfortable). Then slowly slide your foot back down until your leg is flat on the floor again. Angel Wings: Lying on your back spread your legs to the side as far apart as you can without causing discomfort.  A rehabilitation program following serious knee injuries can speed recovery and prevent re-injury in the future due to weakened muscles. Contact your doctor or a physical therapist for more information on knee rehabilitation.   POST-OPERATIVE OPIOID TAPER INSTRUCTIONS: It is important to wean off of your opioid medication as soon as possible. If you do not need pain medication after your surgery it is ok to stop day one. Opioids include: Codeine, Hydrocodone(Norco, Vicodin), Oxycodone(Percocet, oxycontin) and hydromorphone amongst others.  Long term and even short term use of opiods can  cause: Increased pain response Dependence Constipation Depression Respiratory depression And more.  Withdrawal symptoms can include Flu like symptoms Nausea, vomiting And more Techniques to manage these symptoms Hydrate well Eat regular healthy meals Stay active Use relaxation techniques(deep breathing, meditating, yoga) Do Not substitute Alcohol to help with tapering If you have been on opioids for less than two weeks and do not have pain than it is ok to stop all together.  Plan   to wean off of opioids This plan should start within one week post op of your joint replacement. Maintain the same interval or time between taking each dose and first decrease the dose.  Cut the total daily intake of opioids by one tablet each day Next start to increase the time between doses. The last dose that should be eliminated is the evening dose.   IF YOU ARE TRANSFERRED TO A SKILLED REHAB FACILITY If the patient is transferred to a skilled rehab facility following release from the hospital, a list of the current medications will be sent to the facility for the patient to continue.  When discharged from the skilled rehab facility, please have the facility set up the patient's Home Health Physical Therapy prior to being released. Also, the skilled facility will be responsible for providing the patient with their medications at time of release from the facility to include their pain medication, the muscle relaxants, and their blood thinner medication. If the patient is still at the rehab facility at time of the two week follow up appointment, the skilled rehab facility will also need to assist the patient in arranging follow up appointment in our office and any transportation needs.  MAKE SURE YOU:  Understand these instructions.  Get help right away if you are not doing well or get worse.   DENTAL ANTIBIOTICS:  In most cases prophylactic antibiotics for Dental procdeures after total joint surgery are  not necessary.  Exceptions are as follows:  1. History of prior total joint infection  2. Severely immunocompromised (Organ Transplant, cancer chemotherapy, Rheumatoid biologic meds such as Humera)  3. Poorly controlled diabetes (A1C &gt; 8.0, blood glucose over 200)  If you have one of these conditions, contact your surgeon for an antibiotic prescription, prior to your dental procedure.    Pick up stool softner and laxative for home use following surgery while on pain medications. Do not submerge incision under water. Please use good hand washing techniques while changing dressing each day. May shower starting three days after surgery. Please use a clean towel to pat the incision dry following showers. Continue to use ice for pain and swelling after surgery. Do not use any lotions or creams on the incision until instructed by your surgeon.  

## 2022-08-09 NOTE — Anesthesia Postprocedure Evaluation (Signed)
Anesthesia Post Note  Patient: Megan Reilly  Procedure(s) Performed: TOTAL KNEE ARTHROPLASTY (Left: Knee)     Patient location during evaluation: PACU Anesthesia Type: Spinal and Regional Level of consciousness: oriented and awake and alert Pain management: pain level controlled Vital Signs Assessment: post-procedure vital signs reviewed and stable Respiratory status: spontaneous breathing and respiratory function stable Cardiovascular status: blood pressure returned to baseline and stable Postop Assessment: no headache, no backache, no apparent nausea or vomiting, spinal receding and patient able to bend at knees Anesthetic complications: no   No notable events documented.  Last Vitals:  Vitals:   08/09/22 1545 08/09/22 1600  BP: 130/77 134/88  Pulse: (!) 49 (!) 52  Resp: 13 19  Temp:  (!) 36 C  SpO2: 97% 99%    Last Pain:  Vitals:   08/09/22 1600  TempSrc:   PainSc: 0-No pain    LLE Motor Response: Purposeful movement (08/09/22 1600) LLE Sensation: Decreased;Numbness (08/09/22 1600) RLE Motor Response: Purposeful movement (08/09/22 1600) RLE Sensation: Decreased;Numbness (08/09/22 1600) L Sensory Level: S1-Sole of foot, small toes (08/09/22 1600) R Sensory Level: S1-Sole of foot, small toes (08/09/22 1600)  Horacio Werth,W. EDMOND

## 2022-08-09 NOTE — Op Note (Signed)
OPERATIVE REPORT-TOTAL KNEE ARTHROPLASTY   Pre-operative diagnosis- Osteoarthritis  Left knee(s)  Post-operative diagnosis- Osteoarthritis Left knee(s)  Procedure-  Left  Total Knee Arthroplasty  Surgeon- Dione Plover. Twala Collings, MD  Assistant- Molli Barrows, PA-C   Anesthesia-   Adductor canal block and spinal  EBL-30 mL   Drains None  Tourniquet time- 40 minutes @ 831 mm Hg  Complications- None  Condition-PACU - hemodynamically stable.   Brief Clinical Note  Megan Reilly is a 81 y.o. year old female with end stage OA of her left knee with progressively worsening pain and dysfunction. She has constant pain, with activity and at rest and significant functional deficits with difficulties even with ADLs. She has had extensive non-op management including analgesics, injections of cortisone and viscosupplements, and home exercise program, but remains in significant pain with significant dysfunction. Radiographs show bone on bone arthritis lateral and patellofemoral. She presents now for left Total Knee Arthroplasty.     Procedure in detail---   The patient is brought into the operating room and positioned supine on the operating table. After successful administration of  Adductor canal block and spinal,   a tourniquet is placed high on the  Left thigh(s) and the lower extremity is prepped and draped in the usual sterile fashion. Time out is performed by the operating team and then the  Left lower extremity is wrapped in Esmarch, knee flexed and the tourniquet inflated to 300 mmHg.       A midline incision is made with a ten blade through the subcutaneous tissue to the level of the extensor mechanism. A fresh blade is used to make a medial parapatellar arthrotomy. Soft tissue over the proximal medial tibia is subperiosteally elevated to the joint line with a knife and into the semimembranosus bursa with a Cobb elevator. Soft tissue over the proximal lateral tibia is elevated with attention  being paid to avoiding the patellar tendon on the tibial tubercle. The patella is everted, knee flexed 90 degrees and the ACL and PCL are removed. Findings are bone on bone all 3 compartments with large global osteophytes        The drill is used to create a starting hole in the distal femur and the canal is thoroughly irrigated with sterile saline to remove the fatty contents. The 5 degree Left  valgus alignment guide is placed into the femoral canal and the distal femoral cutting block is pinned to remove 9 mm off the distal femur. Resection is made with an oscillating saw.      The tibia is subluxed forward and the menisci are removed. The extramedullary alignment guide is placed referencing proximally at the medial aspect of the tibial tubercle and distally along the second metatarsal axis and tibial crest. The block is pinned to remove 40m off the more deficient lateral  side. Resection is made with an oscillating saw. Size 6is the most appropriate size for the tibia and the proximal tibia is prepared with the modular drill and keel punch for that size.      The femoral sizing guide is placed and size 6 is most appropriate. Rotation is marked off the epicondylar axis and confirmed by creating a rectangular flexion gap at 90 degrees. The size 6 cutting block is pinned in this rotation and the anterior, posterior and chamfer cuts are made with the oscillating saw. The intercondylar block is then placed and that cut is made.      Trial size 6 tibial component, trial size  6 posterior stabilized femur and a 12  mm posterior stabilized rotating platform insert trial is placed. Full extension is achieved with excellent varus/valgus and anterior/posterior balance throughout full range of motion. The patella is everted and thickness measured to be 24  mm. Free hand resection is taken to 14 mm, a 35 template is placed, lug holes are drilled, trial patella is placed, and it tracks normally. Osteophytes are removed off  the posterior femur with the trial in place. All trials are removed and the cut bone surfaces prepared with pulsatile lavage. Cement is mixed and once ready for implantation, the size 6 tibial implant, size  6 posterior stabilized femoral component, and the size 35 patella are cemented in place and the patella is held with the clamp. The trial insert is placed and the knee held in full extension. The Exparel (20 ml mixed with 60 ml saline) is injected into the extensor mechanism, posterior capsule, medial and lateral gutters and subcutaneous tissues.  All extruded cement is removed and once the cement is hard the permanent 12 mm posterior stabilized rotating platform insert is placed into the tibial tray.      The wound is copiously irrigated with saline solution and the extensor mechanism closed with # 0 Stratofix suture. The tourniquet is released for a total tourniquet time of 40  minutes. Flexion against gravity is 140 degrees and the patella tracks normally. Subcutaneous tissue is closed with 2.0 vicryl and subcuticular with running 4.0 Monocryl. The incision is cleaned and dried and steri-strips and a bulky sterile dressing are applied. The limb is placed into a knee immobilizer and the patient is awakened and transported to recovery in stable condition.      Please note that a surgical assistant was a medical necessity for this procedure in order to perform it in a safe and expeditious manner. Surgical assistant was necessary to retract the ligaments and vital neurovascular structures to prevent injury to them and also necessary for proper positioning of the limb to allow for anatomic placement of the prosthesis.   Dione Plover Judi Jaffe, MD    08/09/2022, 1:38 PM

## 2022-08-09 NOTE — Evaluation (Signed)
Physical Therapy Evaluation Patient Details Name: Megan Reilly MRN: [id number] DOB: May 07, 1941 Today's Date: 08/09/2022  History of Present Illness  Pt is an 81yo female presenting s/p L-TKA on 08/09/22. PMH: HTN, OP, hx of vasovagal syncope, R-TKA 2019.  Clinical Impression  Kimberlie Csaszar Mcconathy is a 81 y.o. female POD 0 s/p L-TKA. Patient reports modified independence using SPC with mobility at baseline. Patient is now limited by functional impairments (see PT problem list below) and requires supervision for bed mobility and min assist for transfers; further mobility deferred secondary to L knee buckling. Patient instructed in exercise to facilitate ROM and circulation to manage edema. Provided incentive spirometer and with Vcs pt able to achieve 1526m. Patient will benefit from continued skilled PT interventions to address impairments and progress towards PLOF. Acute PT will follow to progress mobility and stair training in preparation for safe discharge home.       Recommendations for follow up therapy are one component of a multi-disciplinary discharge planning process, led by the attending physician.  Recommendations may be updated based on patient status, additional functional criteria and insurance authorization.  Follow Up Recommendations Follow physician's recommendations for discharge plan and follow up therapies      Assistance Recommended at Discharge Frequent or constant Supervision/Assistance  Patient can return home with the following  A little help with walking and/or transfers;A little help with bathing/dressing/bathroom;Assistance with cooking/housework;Assist for transportation;Help with stairs or ramp for entrance    Equipment Recommendations None recommended by PT  Recommendations for Other Services       Functional Status Assessment Patient has had a recent decline in their functional status and demonstrates the ability to make significant improvements in  function in a reasonable and predictable amount of time.     Precautions / Restrictions Precautions Precautions: Fall Precaution Comments: no pillow under the knee Restrictions Weight Bearing Restrictions: No      Mobility  Bed Mobility Overal bed mobility: Needs Assistance Bed Mobility: Supine to Sit     Supine to sit: Supervision, HOB elevated     General bed mobility comments: For safety only    Transfers Overall transfer level: Needs assistance Equipment used: Rolling walker (2 wheels) Transfers: Sit to/from Stand, Bed to chair/wheelchair/BSC Sit to Stand: Min assist, From elevated surface   Step pivot transfers: Min guard       General transfer comment: Sit to stand: min assist secondary to L knee buckling, pt able to come to fully upright standing wtih increased time and use of BUE. Pt unable to take steps without buckling. Step pivot: min guard for safety and sequencing.    Ambulation/Gait               General Gait Details: unsafe at present  Stairs            Wheelchair Mobility    Modified Rankin (Stroke Patients Only)       Balance                                             Pertinent Vitals/Pain Pain Assessment Pain Assessment: No/denies pain    Home Living Family/patient expects to be discharged to:: Private residence Living Arrangements: Alone Available Help at Discharge: Family;Available 24 hours/day Type of Home: House Home Access: Stairs to enter Entrance Stairs-Rails: Left Entrance Stairs-Number of Steps: 2   Home Layout: One  level Home Equipment: Grab bars - toilet;Grab bars - tub/shower;Shower Land (2 wheels);Cane - single point      Prior Function Prior Level of Function : Independent/Modified Independent             Mobility Comments: Home: no AD unless "I'm feeling unsteady," SPC. Community: SPC ADLs Comments: IND     Hand Dominance        Extremity/Trunk  Assessment   Upper Extremity Assessment Upper Extremity Assessment: Overall WFL for tasks assessed    Lower Extremity Assessment Lower Extremity Assessment: RLE deficits/detail;LLE deficits/detail RLE Deficits / Details: MMT ank DF/PF 5/5 RLE Sensation: WNL LLE Deficits / Details: MMT ank DF/PF 5/5, no extensor lag noted LLE Sensation: WNL    Cervical / Trunk Assessment Cervical / Trunk Assessment: Kyphotic  Communication   Communication: No difficulties;HOH  Cognition Arousal/Alertness: Awake/alert Behavior During Therapy: WFL for tasks assessed/performed Overall Cognitive Status: Within Functional Limits for tasks assessed                                          General Comments General comments (skin integrity, edema, etc.): daughter Benjamine Mola present    Exercises Total Joint Exercises Ankle Circles/Pumps: AROM, Both, 10 reps   Assessment/Plan    PT Assessment Patient needs continued PT services  PT Problem List Decreased strength;Decreased range of motion;Decreased activity tolerance;Decreased balance;Decreased mobility;Decreased coordination;Pain       PT Treatment Interventions DME instruction;Gait training;Stair training;Functional mobility training;Therapeutic activities;Therapeutic exercise;Balance training;Neuromuscular re-education;Patient/family education    PT Goals (Current goals can be found in the Care Plan section)  Acute Rehab PT Goals Patient Stated Goal: Return to [employer] [facility] PT Goal Formulation: With patient Time For Goal Achievement: 08/16/22 Potential to Achieve Goals: Good    Frequency 7X/week     Co-evaluation               AM-PAC PT "6 Clicks" Mobility  Outcome Measure Help needed turning from your back to your side while in a flat bed without using bedrails?: A Little Help needed moving from lying on your back to sitting on the side of a flat bed without using bedrails?: A Little Help  needed moving to and from a bed to a chair (including a wheelchair)?: A Little Help needed standing up from a chair using your arms (e.g., wheelchair or bedside chair)?: A Little Help needed to walk in hospital room?: A Little Help needed climbing 3-5 steps with a railing? : A Little 6 Click Score: 18    End of Session Equipment Utilized During Treatment: Gait belt Activity Tolerance: Patient tolerated treatment well;No increased pain Patient left: in chair;with call bell/phone within reach;with chair alarm set;with SCD's reapplied Nurse Communication: Mobility status PT Visit Diagnosis: Difficulty in walking, not elsewhere classified (R26.2);Pain Pain - Right/Left: Right Pain - part of body: Knee    Time: 3893-7342 PT Time Calculation (min) (ACUTE ONLY): 27 min   Charges:   PT Evaluation $PT Eval Low Complexity: 1 Low PT Treatments $Therapeutic Activity: 8-22 mins        Coolidge Breeze, PT, DPT [facility] Rehabilitation Department Office: [phone] Weekend pager: [phone]  Coolidge Breeze 08/09/2022, 6:57 PM

## 2022-08-10 ENCOUNTER — Encounter (HOSPITAL_COMMUNITY): Payer: Self-pay | Admitting: Orthopedic Surgery

## 2022-08-10 DIAGNOSIS — M1712 Unilateral primary osteoarthritis, left knee: Secondary | ICD-10-CM | POA: Diagnosis not present

## 2022-08-10 DIAGNOSIS — I10 Essential (primary) hypertension: Secondary | ICD-10-CM | POA: Diagnosis not present

## 2022-08-10 DIAGNOSIS — Z79899 Other long term (current) drug therapy: Secondary | ICD-10-CM | POA: Diagnosis not present

## 2022-08-10 DIAGNOSIS — Z85828 Personal history of other malignant neoplasm of skin: Secondary | ICD-10-CM | POA: Diagnosis not present

## 2022-08-10 DIAGNOSIS — Z96651 Presence of right artificial knee joint: Secondary | ICD-10-CM | POA: Diagnosis not present

## 2022-08-10 LAB — BASIC METABOLIC PANEL
Anion gap: 8 (ref 5–15)
BUN: 22 mg/dL (ref 8–23)
CO2: 22 mmol/L (ref 22–32)
Calcium: 9 mg/dL (ref 8.9–10.3)
Chloride: 110 mmol/L (ref 98–111)
Creatinine, Ser: 0.7 mg/dL (ref 0.44–1.00)
GFR, Estimated: >60 mL/min (ref >60)
Glucose, Bld: 171 mg/dL — ABNORMAL HIGH (ref 70–99)
Potassium: 4.2 mmol/L (ref 3.5–5.1)
Sodium: 140 mmol/L (ref 135–145)

## 2022-08-10 LAB — CBC
HCT: 34.1 % — ABNORMAL LOW (ref 36.0–46.0)
Hemoglobin: 11.3 g/dL — ABNORMAL LOW (ref 12.0–15.0)
MCH: 32.5 pg (ref 26.0–34.0)
MCHC: 33.1 g/dL (ref 30.0–36.0)
MCV: 98 fL (ref 80.0–100.0)
Platelets: 169 10*3/uL (ref 150–400)
RBC: 3.48 MIL/uL — ABNORMAL LOW (ref 3.87–5.11)
RDW: 12 % (ref 11.5–15.5)
WBC: 11 10*3/uL — ABNORMAL HIGH (ref 4.0–10.5)
nRBC: 0 % (ref 0.0–0.2)

## 2022-08-10 MED ORDER — TRAMADOL HCL 50 MG PO TABS: 50.0000 mg | ORAL_TABLET | Freq: Four times a day (QID) | ORAL | 0 refills | Status: DC | PRN

## 2022-08-10 MED ORDER — ASPIRIN 325 MG PO TBEC: 325.0000 mg | DELAYED_RELEASE_TABLET | Freq: Two times a day (BID) | ORAL | 0 refills | Status: AC

## 2022-08-10 MED ORDER — OXYCODONE HCL 5 MG PO TABS: 5.0000 mg | ORAL_TABLET | Freq: Four times a day (QID) | ORAL | 0 refills | Status: DC | PRN

## 2022-08-10 MED ORDER — METHOCARBAMOL 500 MG PO TABS: 500.0000 mg | ORAL_TABLET | Freq: Four times a day (QID) | ORAL | 0 refills | Status: DC | PRN

## 2022-08-10 NOTE — Progress Notes (Signed)
Physical Therapy Treatment Patient Details Name: Megan Reilly MRN: [id number] DOB: 20-Nov-1940 Today's Date: 08/10/2022   History of Present Illness Pt is an 81yo female presenting s/p L-TKA on 08/09/22. PMH: HTN, OP, hx of vasovagal syncope, R-TKA 2019.    PT Comments    Progressing with mobility. Pt tolerated distance well. Pain was controlled during session. Will plan to have a 2nd session prior to possible d/c if pt meets her PT goals.    Recommendations for follow up therapy are one component of a multi-disciplinary discharge planning process, led by the attending physician.  Recommendations may be updated based on patient status, additional functional criteria and insurance authorization.  Follow Up Recommendations  Follow physician's recommendations for discharge plan and follow up therapies     Assistance Recommended at Discharge Frequent or constant Supervision/Assistance  Patient can return home with the following Help with stairs or ramp for entrance;Assist for transportation;Assistance with cooking/housework;A little help with walking and/or transfers;A little help with bathing/dressing/bathroom   Equipment Recommendations  None recommended by PT    Recommendations for Other Services       Precautions / Restrictions Precautions Precautions: Fall Precaution Comments: no pillow under the knee Restrictions Weight Bearing Restrictions: No     Mobility  Bed Mobility Overal bed mobility: Needs Assistance Bed Mobility: Supine to Sit     Supine to sit: Supervision, HOB elevated     General bed mobility comments: For safety only    Transfers Overall transfer level: Needs assistance Equipment used: Rolling walker (2 wheels) Transfers: Sit to/from Stand Sit to Stand: Min guard, From elevated surface   Step pivot transfers: Min guard       General transfer comment: Min guard for safety. Cues for safety, hand/LE placement.     Ambulation/Gait Ambulation/Gait assistance: Min assist Gait Distance (Feet): 75 Feet Assistive device: Rolling walker (2 wheels) Gait Pattern/deviations: Step-to pattern       General Gait Details: Assist to stabilize pt throughout distance. Cues for safety, RW  proximity, sequence.   Stairs             Wheelchair Mobility    Modified Rankin (Stroke Patients Only)       Balance Overall balance assessment: Needs assistance         Standing balance support: Bilateral upper extremity supported, During functional activity, Reliant on assistive device for balance Standing balance-Leahy Scale: Poor                              Cognition Arousal/Alertness: Awake/alert Behavior During Therapy: WFL for tasks assessed/performed Overall Cognitive Status: Within Functional Limits for tasks assessed                                          Exercises Ankle Pumps, 10 reps, Active Quad sets, 10 reps, Active Seated knee flexion, 10 reps, Active SLR, 10 reps, Active Hip Abd/add, 10 reps, Active     General Comments        Pertinent Vitals/Pain Pain Assessment Pain Assessment: No/denies pain    Home Living                          Prior Function            PT Goals (current goals can now be found  in the care plan section) Progress towards PT goals: Progressing toward goals    Frequency    7X/week      PT Plan Current plan remains appropriate    Co-evaluation              AM-PAC PT "6 Clicks" Mobility   Outcome Measure  Help needed turning from your back to your side while in a flat bed without using bedrails?: None Help needed moving from lying on your back to sitting on the side of a flat bed without using bedrails?: None Help needed moving to and from a bed to a chair (including a wheelchair)?: A Little Help needed standing up from a chair using your arms (e.g., wheelchair or bedside chair)?: A  Little Help needed to walk in hospital room?: A Little Help needed climbing 3-5 steps with a railing? : A Little 6 Click Score: 20    End of Session Equipment Utilized During Treatment: Gait belt Activity Tolerance: Patient tolerated treatment well Patient left: in chair;with call bell/phone within reach;with chair alarm set   PT Visit Diagnosis: Difficulty in walking, not elsewhere classified (R26.2);Pain Pain - Right/Left: Right Pain - part of body: Knee     Time: 1010-1037 PT Time Calculation (min) (ACUTE ONLY): 27 min  Charges:  $Gait Training: 8-22 mins $Therapeutic Exercise: 8-22 mins                        Doreatha Massed, PT Acute Rehabilitation  Office: [phone]

## 2022-08-10 NOTE — Progress Notes (Signed)
   Subjective: 1 Day Post-Op Procedure(s) (LRB): TOTAL KNEE ARTHROPLASTY (Left) Patient reports pain as mild.   Patient seen in rounds by Dr. Wynelle Link. Patient is well, and has had no acute complaints or problems No issues overnight. Denies chest pain, SOB, or calf pain. Foley catheter removed this AM.  We will continue therapy today.   Objective: Vital signs in last 24 hours: Temp:  [96.5 F (35.8 C)-98.7 F (37.1 C)] 98.7 F (37.1 C) (10/31 0533) Pulse Rate:  [47-75] 74 (10/31 0533) Resp:  [13-21] 16 (10/31 0533) BP: (103-156)/(58-89) 126/68 (10/31 0533) SpO2:  [94 %-100 %] 94 % (10/31 0533) Weight:  [72.9 kg] 72.9 kg (10/30 1058)  Intake/Output from previous day:  Intake/Output Summary (Last 24 hours) at 08/10/2022 0809 Last data filed at 08/10/2022 0600 Gross per 24 hour  Intake 2767.13 ml  Output 2130 ml  Net 637.13 ml     Intake/Output this shift: No intake/output data recorded.  Labs: Recent Labs    08/10/22 0334  HGB 11.3*   Recent Labs    08/10/22 0334  WBC 11.0*  RBC 3.48*  HCT 34.1*  PLT 169   Recent Labs    08/10/22 0334  NA 140  K 4.2  CL 110  CO2 22  BUN 22  CREATININE 0.70  GLUCOSE 171*  CALCIUM 9.0   No results for input(s): "LABPT", "INR" in the last 72 hours.  Exam: General - Patient is Alert and Oriented Extremity - Neurologically intact Neurovascular intact Sensation intact distally Dorsiflexion/Plantar flexion intact Dressing - dressing C/D/I Motor Function - intact, moving foot and toes well on exam.   Past Medical History:  Diagnosis Date   Allergy    Arthritis    Blood transfusion abn reaction or complication, no procedure mishap    81 years old after tonsilectomy   Hypertension    Osteoporosis 04/2019   T score -2.1 improved from prior DEXA   Tibia fracture    left   Vasovagal syncope    2 prior incidences last was in 2015 ; treated in ED , reports no recurrence since then     Assessment/Plan: 1 Day Post-Op  Procedure(s) (LRB): TOTAL KNEE ARTHROPLASTY (Left) Principal Problem:   Primary osteoarthritis of left knee  Estimated body mass index is 24.45 kg/m as calculated from the following:   Height as of this encounter: '5\' 8"'$  (1.727 m).   Weight as of this encounter: 72.9 kg. Advance diet Up with therapy D/C IV fluids   Patient's anticipated LOS is less than 2 midnights, meeting these requirements: - Lives within 1 hour of care - Has a competent adult at home to recover with post-op recover - NO history of  - Chronic pain requiring opiods  - Diabetes  - Coronary Artery Disease  - Heart failure  - Heart attack  - Stroke  - DVT/VTE  - Cardiac arrhythmia  - Respiratory Failure/COPD  - Renal failure  - Anemia  - Advanced Liver disease  DVT Prophylaxis - Aspirin Weight bearing as tolerated. Continue therapy.  Plan is to go Home after hospital stay. Plan for discharge later today if progresses with therapy and meeting goals. Scheduled [facility] at [facility]. Follow-up in the office in 2 weeks.  The PDMP database was reviewed today prior to any opioid medications being prescribed to this patient.  Theresa Duty, PA-C Orthopedic Surgery [phone] 08/10/2022, 8:09 AM

## 2022-08-10 NOTE — Progress Notes (Signed)
Physical Therapy Treatment Patient Details Name: Megan Reilly MRN: [id number] DOB: [birth year] Today's Date: 08/10/2022   History of Present Illness Pt is an 81yo female presenting s/p L-TKA on 08/09/22. PMH: HTN, OP, hx of vasovagal syncope, R-TKA 2019.    PT Comments    2nd session to practice stair negotiation. Instructed pt to try to walk often at home. Plan is for her to begin OP PT on Thursday. All education completed. Pt states she is ready to d/c home on today.    Recommendations for follow up therapy are one component of a multi-disciplinary discharge planning process, led by the attending physician.  Recommendations may be updated based on patient status, additional functional criteria and insurance authorization.  Follow Up Recommendations  Follow physician's recommendations for discharge plan and follow up therapies     Assistance Recommended at Discharge Frequent or constant Supervision/Assistance  Patient can return home with the following Help with stairs or ramp for entrance;Assist for transportation;Assistance with cooking/housework;A little help with walking and/or transfers;A little help with bathing/dressing/bathroom   Equipment Recommendations  None recommended by PT    Recommendations for Other Services       Precautions / Restrictions Precautions Precautions: Fall;Knee Precaution Comments: no pillow under the knee Restrictions Weight Bearing Restrictions: No     Mobility  Bed Mobility Overal bed mobility: Needs Assistance Bed Mobility: Supine to Sit     Supine to sit: Supervision, HOB elevated     General bed mobility comments: oob in recliner    Transfers Overall transfer level: Needs assistance Equipment used: Rolling walker (2 wheels) Transfers: Sit to/from Stand Sit to Stand: Min guard   Step pivot transfers: Min guard       General transfer comment: Min guard for safety. Cues for safety, hand/LE placement.     Ambulation/Gait Ambulation/Gait assistance: Min guard Gait Distance (Feet): 75 Feet Assistive device: Rolling walker (2 wheels) Gait Pattern/deviations: Step-to pattern       General Gait Details: Assist to stabilize pt throughout distance. Cues for safety, RW  proximity, sequence.   Stairs Stairs: Yes Stairs assistance: Min guard Stair Management: One rail Left, Sideways Number of Stairs: 2 (x2) General stair comments: Up and over portable stairs x 2. Cues for safety, technique, sequence. Min guard assist.   Wheelchair Mobility    Modified Rankin (Stroke Patients Only)       Balance Overall balance assessment: Needs assistance         Standing balance support: Bilateral upper extremity supported, During functional activity, Reliant on assistive device for balance Standing balance-Leahy Scale: Poor                              Cognition Arousal/Alertness: Awake/alert Behavior During Therapy: WFL for tasks assessed/performed Overall Cognitive Status: Within Functional Limits for tasks assessed                                          Exercises      General Comments        Pertinent Vitals/Pain Pain Assessment Pain Assessment: 0-10 Pain Score: 2  Pain Location: L knee Pain Descriptors / Indicators: Discomfort, Sore Pain Intervention(s): Monitored during session, Repositioned    Home Living  Prior Function            PT Goals (current goals can now be found in the care plan section) Progress towards PT goals: Progressing toward goals    Frequency    7X/week      PT Plan Current plan remains appropriate    Co-evaluation              AM-PAC PT "6 Clicks" Mobility   Outcome Measure  Help needed turning from your back to your side while in a flat bed without using bedrails?: None Help needed moving from lying on your back to sitting on the side of a flat bed without  using bedrails?: None Help needed moving to and from a bed to a chair (including a wheelchair)?: A Little Help needed standing up from a chair using your arms (e.g., wheelchair or bedside chair)?: A Little Help needed to walk in hospital room?: A Little Help needed climbing 3-5 steps with a railing? : A Little 6 Click Score: 20    End of Session Equipment Utilized During Treatment: Gait belt Activity Tolerance: Patient tolerated treatment well Patient left: in chair;with call bell/phone within reach;with chair alarm set   PT Visit Diagnosis: Difficulty in walking, not elsewhere classified (R26.2) Pain - Right/Left: Left Pain - part of body: Knee     Time: 2707-8675 PT Time Calculation (min) (ACUTE ONLY): 23 min  Charges:  $Gait Training: 23-37 mins $Therapeutic Exercise: 8-22 mins                         Doreatha Massed, PT Acute Rehabilitation  Office: [phone]

## 2022-08-10 NOTE — TOC Transition Note (Signed)
Transition of Care [facility]) - CM/SW Discharge Note   Patient Details  Name: Megan Reilly MRN: [id number] Date of Birth: [birth year]  Transition of Care [facility]) CM/SW Contact:  Lennart Pall, LCSW Phone Number: 08/10/2022, 9:42 AM   Clinical Narrative:    Met with pt and confirming she has needed DME at home.  OPPT already arranged with [facility].  No TOC needs.   Final next level of care: OP Rehab Barriers to Discharge: No Barriers Identified   Patient Goals and CMS Choice Patient states their goals for this hospitalization and ongoing recovery are:: return home      Discharge Placement                       Discharge Plan and Services                DME Arranged: N/A                    Social Determinants of Health (SDOH) Interventions     Readmission Risk Interventions     No data to display

## 2022-08-10 NOTE — Progress Notes (Signed)
Patient discharged to home w/ dtr. Given all belongings, instructions. Verbalized understanding of instructions. Escorted to pov via w/c. 

## 2022-08-11 NOTE — Discharge Summary (Signed)
Patient ID: Megan Reilly MRN: [id number] DOB/AGE: [birth year] 81 y.o.  Admit date: 08/09/2022 Discharge date: 08/10/2022  Admission Diagnoses:  Principal Problem:   Primary osteoarthritis of left knee   Discharge Diagnoses:  Same  Past Medical History:  Diagnosis Date   Allergy    Arthritis    Blood transfusion abn reaction or complication, no procedure mishap    81 years old after tonsilectomy   Hypertension    Osteoporosis 04/2019   T score -2.1 improved from prior DEXA   Tibia fracture    left   Vasovagal syncope    2 prior incidences last was in 2015 ; treated in ED , reports no recurrence since then     Surgeries: Procedure(s): TOTAL KNEE ARTHROPLASTY on 08/09/2022   Consultants:   Discharged Condition: Improved  Hospital Course: Megan Reilly is an 81 y.o. female who was admitted 08/09/2022 for operative treatment ofPrimary osteoarthritis of left knee. Patient has severe unremitting pain that affects sleep, daily activities, and work/hobbies. After pre-op clearance the patient was taken to the operating room on 08/09/2022 and underwent  Procedure(s): TOTAL KNEE ARTHROPLASTY.    Patient was given perioperative antibiotics:  Anti-infectives (From admission, onward)    Start     Dose/Rate Route Frequency Ordered Stop   08/09/22 1800  ceFAZolin (ANCEF) IVPB 2g/100 mL premix        2 g 200 mL/hr over 30 Minutes Intravenous Every 6 hours 08/09/22 1612 08/10/22 0028   08/09/22 1100  ceFAZolin (ANCEF) IVPB 2g/100 mL premix        2 g 200 mL/hr over 30 Minutes Intravenous On call to O.R. 08/09/22 1049 08/09/22 1234        Patient was given sequential compression devices, early ambulation, and chemoprophylaxis to prevent DVT.  Patient benefited maximally from hospital stay and there were no complications.    Recent vital signs: No data found.   Recent laboratory studies:  Recent Labs    08/10/22 0334  WBC 11.0*  HGB 11.3*  HCT 34.1*  PLT 169  NA  140  K 4.2  CL 110  CO2 22  BUN 22  CREATININE 0.70  GLUCOSE 171*  CALCIUM 9.0     Discharge Medications:   Allergies as of 08/10/2022       Reactions   Benadryl [diphenhydramine] Other (See Comments)   Light headed   Hctz [hydrochlorothiazide] Other (See Comments)   Fatigue, body aches        Medication List     STOP taking these medications    IBUPROFEN PO       TAKE these medications    amLODipine 5 MG tablet Commonly known as: NORVASC Take 1 tablet (5 mg total) by mouth daily. What changed: when to take this   aspirin EC 325 MG tablet Take 1 tablet (325 mg total) by mouth 2 (two) times daily for 20 days. Then take one 81 mg aspirin once a day for three weeks. Then discontinue aspirin.   calcium carbonate 600 MG Tabs tablet Commonly known as: OS-CAL Take 300 mg by mouth every other day.   cetirizine 10 MG tablet Commonly known as: ZYRTEC Take 10 mg by mouth daily as needed for allergies.   fish oil-omega-3 fatty acids 1000 MG capsule Take 1 g by mouth daily.   fluticasone 50 MCG/ACT nasal spray Commonly known as: FLONASE Place 2 sprays into both nostrils daily. Place 1 to 2 sparys in each nostril daily.   latanoprost 0.005 %  ophthalmic solution Commonly known as: XALATAN 1 drop at bedtime.   Magnesium 400 MG Caps Take 1 tablet by mouth daily.   melatonin 3 MG Tabs tablet Take 3 mg by mouth. 3-4 times a week   methocarbamol 500 MG tablet Commonly known as: ROBAXIN Take 1 tablet (500 mg total) by mouth every 6 (six) hours as needed for muscle spasms.   multivitamin with minerals Tabs tablet Take 1 tablet by mouth every morning.   oxyCODONE 5 MG immediate release tablet Commonly known as: Oxy IR/ROXICODONE Take 1-2 tablets (5-10 mg total) by mouth every 6 (six) hours as needed for severe pain.   traMADol 50 MG tablet Commonly known as: ULTRAM Take 1-2 tablets (50-100 mg total) by mouth every 6 (six) hours as needed for moderate pain.    VITAMIN D-3 PO Take 2,500 Units by mouth daily.               Discharge Care Instructions  (From admission, onward)           Start     Ordered   08/10/22 0000  Weight bearing as tolerated        08/10/22 0811   08/10/22 0000  Change dressing       Comments: You may remove the bulky bandage (ACE wrap and gauze) two days after surgery. You will have an adhesive waterproof bandage underneath. Leave this in place until your first follow-up appointment.   08/10/22 0811            Diagnostic Studies: No results found.  Disposition: Discharge disposition: 01-Home or Self Care       Discharge Instructions     Call MD / Call 911   Complete by: As directed    If you experience chest pain or shortness of breath, CALL 911 and be transported to the hospital emergency room.  If you develope a fever above 101 F, pus (white drainage) or increased drainage or redness at the wound, or calf pain, call your surgeon's office.   Change dressing   Complete by: As directed    You may remove the bulky bandage (ACE wrap and gauze) two days after surgery. You will have an adhesive waterproof bandage underneath. Leave this in place until your first follow-up appointment.   Constipation Prevention   Complete by: As directed    Drink plenty of fluids.  Prune juice may be helpful.  You may use a stool softener, such as Colace (over the counter) 100 mg twice a day.  Use MiraLax (over the counter) for constipation as needed.   Diet - low sodium heart healthy   Complete by: As directed    Do not put a pillow under the knee. Place it under the heel.   Complete by: As directed    Driving restrictions   Complete by: As directed    No driving for two weeks   Post-operative opioid taper instructions:   Complete by: As directed    POST-OPERATIVE OPIOID TAPER INSTRUCTIONS: It is important to wean off of your opioid medication as soon as possible. If you do not need pain medication after your  surgery it is ok to stop day one. Opioids include: Codeine, Hydrocodone(Norco, Vicodin), Oxycodone(Percocet, oxycontin) and hydromorphone amongst others.  Long term and even short term use of opiods can cause: Increased pain response Dependence Constipation Depression Respiratory depression And more.  Withdrawal symptoms can include Flu like symptoms Nausea, vomiting And more Techniques to manage these symptoms Hydrate  well Eat regular healthy meals Stay active Use relaxation techniques(deep breathing, meditating, yoga) Do Not substitute Alcohol to help with tapering If you have been on opioids for less than two weeks and do not have pain than it is ok to stop all together.  Plan to wean off of opioids This plan should start within one week post op of your joint replacement. Maintain the same interval or time between taking each dose and first decrease the dose.  Cut the total daily intake of opioids by one tablet each day Next start to increase the time between doses. The last dose that should be eliminated is the evening dose.      TED hose   Complete by: As directed    Use stockings (TED hose) for three weeks on both leg(s).  You may remove them at night for sleeping.   Weight bearing as tolerated   Complete by: As directed         Follow-up Information     Gaynelle Arabian, MD. Go on 08/24/2022.   Specialty: Orthopedic Surgery Why: You are scheduled for first post op appt on Tuesday Nov 14th at 5:00pm. Contact information: [address] [place] [address] [phone]         Rosilyn Mings.. Go on 08/12/2022.   Why: You are scheduled for physical therapy eval on Thursday Nov 2 at 11:15am. Contact information: [place] [address] [phone]                  Signed: Theresa Duty 08/11/2022, 12:33 PM

## 2022-08-12 DIAGNOSIS — M25562 Pain in left knee: Secondary | ICD-10-CM | POA: Diagnosis not present

## 2022-08-16 DIAGNOSIS — M25562 Pain in left knee: Secondary | ICD-10-CM | POA: Diagnosis not present

## 2022-08-18 DIAGNOSIS — M25562 Pain in left knee: Secondary | ICD-10-CM | POA: Diagnosis not present

## 2022-08-20 DIAGNOSIS — M25562 Pain in left knee: Secondary | ICD-10-CM | POA: Diagnosis not present

## 2022-08-23 DIAGNOSIS — M25562 Pain in left knee: Secondary | ICD-10-CM | POA: Diagnosis not present

## 2022-08-25 DIAGNOSIS — M25562 Pain in left knee: Secondary | ICD-10-CM | POA: Diagnosis not present

## 2022-08-27 DIAGNOSIS — M25562 Pain in left knee: Secondary | ICD-10-CM | POA: Diagnosis not present

## 2022-08-30 DIAGNOSIS — M25562 Pain in left knee: Secondary | ICD-10-CM | POA: Diagnosis not present

## 2022-09-01 ENCOUNTER — Encounter: Payer: Self-pay | Admitting: Family Medicine

## 2022-09-01 ENCOUNTER — Ambulatory Visit (INDEPENDENT_AMBULATORY_CARE_PROVIDER_SITE_OTHER): Payer: Medicare HMO | Admitting: Family Medicine

## 2022-09-01 VITALS — BP 130/70 | HR 80 | Temp 98.5°F | Ht 68.0 in | Wt 163.8 lb

## 2022-09-01 DIAGNOSIS — R350 Frequency of micturition: Secondary | ICD-10-CM

## 2022-09-01 DIAGNOSIS — R3915 Urgency of urination: Secondary | ICD-10-CM | POA: Diagnosis not present

## 2022-09-01 LAB — POC URINALSYSI DIPSTICK (AUTOMATED)
Bilirubin, UA: NEGATIVE
Blood, UA: NEGATIVE
Glucose, UA: NEGATIVE
Ketones, UA: NEGATIVE
Nitrite, UA: NEGATIVE
Protein, UA: NEGATIVE
Spec Grav, UA: <=1.005 — AB (ref 1.010–1.025)
Urobilinogen, UA: 0.2 E.U./dL
pH, UA: 7.5 (ref 5.0–8.0)

## 2022-09-01 MED ORDER — MIRABEGRON ER 50 MG PO TB24: 50.0000 mg | ORAL_TABLET | Freq: Every day | ORAL | 5 refills | Status: DC

## 2022-09-01 MED ORDER — CEPHALEXIN 500 MG PO CAPS: 500.0000 mg | ORAL_CAPSULE | Freq: Three times a day (TID) | ORAL | 0 refills | Status: DC

## 2022-09-01 NOTE — Progress Notes (Signed)
Established Patient Office Visit  Subjective   Patient ID: Megan Reilly, female    DOB: Aug 08, 1941  Age: 81 y.o. MRN: [id number]  Chief Complaint  Patient presents with   Urinary Frequency    Tried Mybetriq with little relief    HPI   Megan Reilly is seen with some urine frequency and urgency.  Urgency symptoms are not new.  She is currently on Myrbetriq 25 mg but not seeing much improvement.  She generally drinks 2 cups of coffee in the morning but no caffeine later in the day.  Sometimes does hot tea at night but decaffeinated.  History is that she had left total knee replacement 3 weeks ago.  Is doing very well in recovery.  Did have a catheter in briefly with her surgery.  She has had some very mild occasional burning with urination past couple days.  No fevers or chills.  No flank pain.  Frequently has to get up 4-5 times per night to urinate.  Past Medical History:  Diagnosis Date   Allergy    Arthritis    Blood transfusion abn reaction or complication, no procedure mishap    81 years old after tonsilectomy   Hypertension    Osteoporosis 04/2019   T score -2.1 improved from prior DEXA   Tibia fracture    left   Vasovagal syncope    2 prior incidences last was in 2015 ; treated in ED , reports no recurrence since then    Past Surgical History:  Procedure Laterality Date   COLONOSCOPY     FOOT SURGERY     bunectomy, hammertoe, bil   LAPAROSCOPIC BILATERAL SALPINGO OOPHERECTOMY     TONSILLECTOMY  10/12/1943   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   Procedure: RIGHT TOTAL KNEE ARTHROPLASTY;  Surgeon: Gaynelle Arabian, MD;  Location: [facility];  Service: Orthopedics;  Laterality: Right;   TOTAL KNEE ARTHROPLASTY Left 08/09/2022   Procedure: TOTAL KNEE ARTHROPLASTY;  Surgeon: Gaynelle Arabian, MD;  Location: [facility];  Service: Orthopedics;  Laterality: Left;    reports that she has never smoked. She has never used smokeless tobacco. She reports current alcohol use. She reports  that she does not use drugs. family history includes Breast cancer in her maternal aunt; Diabetes in her father; Heart disease in her mother; Heart disease (age of onset: 2) in her father; Heart failure in her mother; Hyperlipidemia in her father and mother; Stroke in her mother. Allergies  Allergen Reactions   Benadryl [Diphenhydramine] Other (See Comments)    Light headed   Hctz [Hydrochlorothiazide] Other (See Comments)    Fatigue, body aches    Review of Systems  Constitutional:  Negative for chills and fever.  Genitourinary:  Positive for frequency and urgency. Negative for flank pain and hematuria.      Objective:     BP 130/70 (BP Location: Left Arm, Patient Position: Sitting, Cuff Size: Normal)   Pulse 80   Temp 98.5 F (36.9 C) (Oral)   Ht '5\' 8"'$  (1.727 m)   Wt 163 lb 12.8 oz (74.3 kg)   SpO2 99%   BMI 24.91 kg/m  BP Readings from Last 3 Encounters:  09/01/22 130/70  08/10/22 129/60  08/04/22 (!) 140/68   Wt Readings from Last 3 Encounters:  09/01/22 163 lb 12.8 oz (74.3 kg)  08/09/22 160 lb 12.8 oz (72.9 kg)  08/04/22 160 lb 12.8 oz (72.9 kg)      Physical Exam Vitals reviewed.  Constitutional:  Appearance: Normal appearance.  Cardiovascular:     Rate and Rhythm: Normal rate and regular rhythm.  Musculoskeletal:     Comments: She has some, as expected, swelling of the left knee from recent surgery  Neurological:     Mental Status: She is alert.      Results for orders placed or performed in visit on 09/01/22  POCT Urinalysis Dipstick (Automated)  Result Value Ref Range   Color, UA Yellow    Clarity, UA Clear    Glucose, UA Negative Negative   Bilirubin, UA Negative    Ketones, UA Negative    Spec Grav, UA <=1.005 (A) 1.010 - 1.025   Blood, UA Negative    pH, UA 7.5 5.0 - 8.0   Protein, UA Negative Negative   Urobilinogen, UA 0.2 0.2 or 1.0 E.U./dL   Nitrite, UA Negative    Leukocytes, UA Large (3+) (A) Negative      The ASCVD Risk  score (Arnett DK, et al., 2019) failed to calculate for the following reasons:   The 2019 ASCVD risk score is only valid for ages 78 to 13    Assessment & Plan:   Problem List Items Addressed This Visit   None Visit Diagnoses     Urinary frequency    -  Primary   Relevant Orders   Urine Culture   POCT Urinalysis Dipstick (Automated) (Completed)   Urgency of urination         Urine dipstick does show 3+ leukocytes. Possible infection.  Urine culture sent.  Start Keflex 500 mg 3 times daily for 5 days pending culture results  She has background of some urine urgency which has been more chronic.  Very little benefit with Myrbetriq 25 mg.  We did discuss increasing her Myrbetriq to 50 mg with new prescription sent.  No follow-ups on file.    Carolann Littler, MD

## 2022-09-01 NOTE — Patient Instructions (Signed)
Let's go ahead and increase the Myrbetriq to 50 mg once daily.

## 2022-09-01 NOTE — Addendum Note (Signed)
Addended by: Nilda Riggs on: 09/01/2022 09:49 AM   Modules accepted: Orders

## 2022-09-04 LAB — URINE CULTURE
MICRO NUMBER:: 14224645
SPECIMEN QUALITY:: ADEQUATE

## 2022-09-06 MED ORDER — NITROFURANTOIN MONOHYD MACRO 100 MG PO CAPS: 100.0000 mg | ORAL_CAPSULE | Freq: Two times a day (BID) | ORAL | 0 refills | Status: AC

## 2022-09-06 NOTE — Addendum Note (Signed)
Addended by: Rosalee Kaufman L on: 09/06/2022 10:00 AM   Modules accepted: Orders

## 2022-09-10 DIAGNOSIS — M25562 Pain in left knee: Secondary | ICD-10-CM | POA: Diagnosis not present

## 2022-09-14 DIAGNOSIS — Z5189 Encounter for other specified aftercare: Secondary | ICD-10-CM | POA: Diagnosis not present

## 2022-09-27 DIAGNOSIS — M79671 Pain in right foot: Secondary | ICD-10-CM | POA: Diagnosis not present

## 2022-09-30 ENCOUNTER — Other Ambulatory Visit: Payer: Self-pay | Admitting: Family Medicine

## 2022-09-30 DIAGNOSIS — R3915 Urgency of urination: Secondary | ICD-10-CM

## 2022-10-07 DIAGNOSIS — S92334A Nondisplaced fracture of third metatarsal bone, right foot, initial encounter for closed fracture: Secondary | ICD-10-CM | POA: Diagnosis not present

## 2022-10-07 DIAGNOSIS — M2011 Hallux valgus (acquired), right foot: Secondary | ICD-10-CM | POA: Diagnosis not present

## 2022-10-13 ENCOUNTER — Ambulatory Visit (INDEPENDENT_AMBULATORY_CARE_PROVIDER_SITE_OTHER): Payer: Medicare HMO | Admitting: Family Medicine

## 2022-10-13 ENCOUNTER — Other Ambulatory Visit: Payer: Self-pay | Admitting: Family Medicine

## 2022-10-13 ENCOUNTER — Encounter: Payer: Self-pay | Admitting: Family Medicine

## 2022-10-13 VITALS — BP 148/68 | HR 70 | Temp 98.3°F | Ht 68.0 in | Wt 165.6 lb

## 2022-10-13 DIAGNOSIS — I10 Essential (primary) hypertension: Secondary | ICD-10-CM | POA: Diagnosis not present

## 2022-10-13 DIAGNOSIS — M810 Age-related osteoporosis without current pathological fracture: Secondary | ICD-10-CM

## 2022-10-13 MED ORDER — LOSARTAN POTASSIUM 50 MG PO TABS: 50.0000 mg | ORAL_TABLET | Freq: Every day | ORAL | 3 refills | Status: DC

## 2022-10-13 NOTE — Progress Notes (Signed)
Established Patient Office Visit  Subjective   Patient ID: Megan Reilly, female    DOB: [birth year]  Age: 82 y.o. MRN: [id number]  Chief Complaint  Patient presents with   Follow-up    HPI   Megan Reilly is seen for the following issues  Follow-up hypertension.  She takes amlodipine 5 mg daily.  She had very high blood pressure prior to recent total knee replacement.  She is not monitoring at home.  Slightly elevated systolic today.  No headaches.  No dizziness.  Recovering well from her recent total knee replacement.  However, she apparently was diagnosed with stress fracture of the right third toe recently.  She does have known osteoporosis.  She was stopped after 5 years of Fosamax 7/22.  She is scheduled for follow-up DEXA scan this summer which would be 2-year interval off of Fosamax.  She does take calcium and vitamin D.  She is reluctant to consider other therapy such as Prolia.  Past Medical History:  Diagnosis Date   Allergy    Arthritis    Blood transfusion abn reaction or complication, no procedure mishap    82 years old after tonsilectomy   Hypertension    Osteoporosis 04/2019   T score -2.1 improved from prior DEXA   Tibia fracture    left   Vasovagal syncope    2 prior incidences last was in 2015 ; treated in ED , reports no recurrence since then    Past Surgical History:  Procedure Laterality Date   COLONOSCOPY     FOOT SURGERY     bunectomy, hammertoe, bil   LAPAROSCOPIC BILATERAL SALPINGO OOPHERECTOMY     TONSILLECTOMY  10/12/1943   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   Procedure: RIGHT TOTAL KNEE ARTHROPLASTY;  Surgeon: Gaynelle Arabian, MD;  Location: [facility];  Service: Orthopedics;  Laterality: Right;   TOTAL KNEE ARTHROPLASTY Left 08/09/2022   Procedure: TOTAL KNEE ARTHROPLASTY;  Surgeon: Gaynelle Arabian, MD;  Location: [facility];  Service: Orthopedics;  Laterality: Left;    reports that she has never smoked. She has never used smokeless tobacco. She  reports current alcohol use. She reports that she does not use drugs. family history includes Breast cancer in her maternal aunt; Diabetes in her father; Heart disease in her mother; Heart disease (age of onset: 18) in her father; Heart failure in her mother; Hyperlipidemia in her father and mother; Stroke in her mother. Allergies  Allergen Reactions   Benadryl [Diphenhydramine] Other (See Comments)    Light headed   Hctz [Hydrochlorothiazide] Other (See Comments)    Fatigue, body aches    Review of Systems  Constitutional:  Negative for malaise/fatigue and weight loss.  Eyes:  Negative for blurred vision.  Respiratory:  Negative for shortness of breath.   Cardiovascular:  Negative for chest pain.  Neurological:  Negative for dizziness, weakness and headaches.      Objective:     BP (!) 148/68 (BP Location: Left Arm, Cuff Size: Normal)   Pulse 70   Temp 98.3 F (36.8 C) (Oral)   Ht '5\' 8"'$  (1.727 m)   Wt 165 lb 9.6 oz (75.1 kg)   SpO2 98%   BMI 25.18 kg/m  BP Readings from Last 3 Encounters:  10/13/22 (!) 148/68  09/01/22 130/70  08/10/22 129/60   Wt Readings from Last 3 Encounters:  10/13/22 165 lb 9.6 oz (75.1 kg)  09/01/22 163 lb 12.8 oz (74.3 kg)  08/09/22 160 lb 12.8 oz (72.9 kg)  Physical Exam Vitals reviewed.  Constitutional:      Appearance: She is well-developed.  Eyes:     Pupils: Pupils are equal, round, and reactive to light.  Neck:     Thyroid: No thyromegaly.     Vascular: No JVD.  Cardiovascular:     Rate and Rhythm: Normal rate and regular rhythm.     Heart sounds:     No gallop.  Pulmonary:     Effort: Pulmonary effort is normal. No respiratory distress.     Breath sounds: Normal breath sounds. No wheezing or rales.  Musculoskeletal:     Cervical back: Neck supple.     Right lower leg: No edema.     Left lower leg: No edema.  Neurological:     Mental Status: She is alert.      No results found for any visits on  10/13/22.    The ASCVD Risk score (Arnett DK, et al., 2019) failed to calculate for the following reasons:   The 2019 ASCVD risk score is only valid for ages 43 to 67    Assessment & Plan:   #1 hypertension suboptimally controlled by systolic readings.  She is currently on amlodipine 5 mg daily but has tendency toward edema and would avoid increasing to 10 mg.  Would probably benefit from thiazide but increased risk for hyponatremia.  We discussed adding low-dose ARB with losartan 50 mg once daily.  Recheck in 3 weeks and check basic metabolic panel then  #2 osteoporosis.  Recent reported stress fracture right third toe.  She has been on Fosamax for 5 years and is currently on 2-year holiday off medication.  We did discuss other potential therapies such as Prolia but she declines at this time.  Continue regular calcium and vitamin D and regular weightbearing exercise as tolerated.   Return in about 3 weeks (around 11/03/2022).    Carolann Littler, MD

## 2022-11-10 ENCOUNTER — Ambulatory Visit (INDEPENDENT_AMBULATORY_CARE_PROVIDER_SITE_OTHER): Payer: Medicare HMO | Admitting: Family Medicine

## 2022-11-10 ENCOUNTER — Encounter: Payer: Self-pay | Admitting: Family Medicine

## 2022-11-10 VITALS — BP 118/68 | HR 73 | Temp 99.2°F | Ht 68.0 in | Wt 158.9 lb

## 2022-11-10 DIAGNOSIS — I10 Essential (primary) hypertension: Secondary | ICD-10-CM

## 2022-11-10 LAB — BASIC METABOLIC PANEL
BUN: 17 mg/dL (ref 6–23)
CO2: 28 mEq/L (ref 19–32)
Calcium: 10.4 mg/dL (ref 8.4–10.5)
Chloride: 106 mEq/L (ref 96–112)
Creatinine, Ser: 0.69 mg/dL (ref 0.40–1.20)
GFR: 81.13 mL/min (ref >60.00)
Glucose, Bld: 96 mg/dL (ref 70–99)
Potassium: 4.3 mEq/L (ref 3.5–5.1)
Sodium: 140 mEq/L (ref 135–145)

## 2022-11-10 NOTE — Progress Notes (Signed)
Established Patient Office Visit  Subjective   Patient ID: Megan Reilly, female    DOB: Jan 18, 1941  Age: 82 y.o. MRN: [id number]  Chief Complaint  Patient presents with   Medical Management of Chronic Issues    HPI   Tria is seen for follow-up hypertension.  We added last visit losartan 50 mg daily which she is tolerating well.  We added this to amlodipine.  She is checking her blood pressure some at the [facility] and has had some readings around 259 systolic but these seem to be trending downward.  Denies any lightheadedness.  Tolerating well with no side effects.  Past Medical History:  Diagnosis Date   Allergy    Arthritis    Blood transfusion abn reaction or complication, no procedure mishap    82 years old after tonsilectomy   Hypertension    Osteoporosis 04/2019   T score -2.1 improved from prior DEXA   Tibia fracture    left   Vasovagal syncope    2 prior incidences last was in 2015 ; treated in ED , reports no recurrence since then    Past Surgical History:  Procedure Laterality Date   COLONOSCOPY     FOOT SURGERY     bunectomy, hammertoe, bil   LAPAROSCOPIC BILATERAL SALPINGO OOPHERECTOMY     TONSILLECTOMY  10/12/1943   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   Procedure: RIGHT TOTAL KNEE ARTHROPLASTY;  Surgeon: Gaynelle Arabian, MD;  Location: [facility];  Service: Orthopedics;  Laterality: Right;   TOTAL KNEE ARTHROPLASTY Left 08/09/2022   Procedure: TOTAL KNEE ARTHROPLASTY;  Surgeon: Gaynelle Arabian, MD;  Location: [facility];  Service: Orthopedics;  Laterality: Left;    reports that she has never smoked. She has never used smokeless tobacco. She reports current alcohol use. She reports that she does not use drugs. family history includes Breast cancer in her maternal aunt; Diabetes in her father; Heart disease in her mother; Heart disease (age of onset: 58) in her father; Heart failure in her mother; Hyperlipidemia in her father and mother; Stroke in her  mother. Allergies  Allergen Reactions   Benadryl [Diphenhydramine] Other (See Comments)    Light headed   Hctz [Hydrochlorothiazide] Other (See Comments)    Fatigue, body aches    Review of Systems  Constitutional:  Negative for malaise/fatigue.  Eyes:  Negative for blurred vision.  Respiratory:  Negative for shortness of breath.   Cardiovascular:  Negative for chest pain.  Neurological:  Negative for dizziness, weakness and headaches.      Objective:     BP 118/68 (BP Location: Left Arm, Cuff Size: Normal)   Pulse 73   Temp 99.2 F (37.3 C) (Oral)   Ht '5\' 8"'$  (1.727 m)   Wt 158 lb 14.4 oz (72.1 kg)   SpO2 97%   BMI 24.16 kg/m    Physical Exam Vitals reviewed.  Constitutional:      Appearance: Normal appearance.  Cardiovascular:     Rate and Rhythm: Normal rate and regular rhythm.  Pulmonary:     Effort: Pulmonary effort is normal.     Breath sounds: Normal breath sounds.  Musculoskeletal:     Right lower leg: No edema.     Left lower leg: No edema.  Neurological:     Mental Status: She is alert.      No results found for any visits on 11/10/22.    The ASCVD Risk score (Arnett DK, et al., 2019) failed to calculate for the  following reasons:   The 2019 ASCVD risk score is only valid for ages 59 to 76    Assessment & Plan:   Problem List Items Addressed This Visit       Unprioritized   Essential hypertension - Primary   Relevant Orders   Basic metabolic panel  Hypertension improved.  Repeat left arm seated after rest 118/68.  Continue losartan 50 mg daily and amlodipine 5 mg daily.  Check basic metabolic panel with recent initiation of ARB.  Continue to watch sodium intake.  Continue regular exercise habits.  Follow-up 4 months.  Return in about 4 months (around 03/11/2023).    Carolann Littler, MD

## 2022-11-22 DIAGNOSIS — L2089 Other atopic dermatitis: Secondary | ICD-10-CM | POA: Diagnosis not present

## 2022-11-22 DIAGNOSIS — D1801 Hemangioma of skin and subcutaneous tissue: Secondary | ICD-10-CM | POA: Diagnosis not present

## 2022-11-22 DIAGNOSIS — L821 Other seborrheic keratosis: Secondary | ICD-10-CM | POA: Diagnosis not present

## 2022-11-22 DIAGNOSIS — C4441 Basal cell carcinoma of skin of scalp and neck: Secondary | ICD-10-CM | POA: Diagnosis not present

## 2022-11-22 DIAGNOSIS — Z85828 Personal history of other malignant neoplasm of skin: Secondary | ICD-10-CM | POA: Diagnosis not present

## 2022-11-22 DIAGNOSIS — L570 Actinic keratosis: Secondary | ICD-10-CM | POA: Diagnosis not present

## 2022-12-09 DIAGNOSIS — S92334A Nondisplaced fracture of third metatarsal bone, right foot, initial encounter for closed fracture: Secondary | ICD-10-CM | POA: Diagnosis not present

## 2022-12-09 DIAGNOSIS — M2011 Hallux valgus (acquired), right foot: Secondary | ICD-10-CM | POA: Diagnosis not present

## 2023-01-10 ENCOUNTER — Other Ambulatory Visit: Payer: Self-pay | Admitting: Family Medicine

## 2023-01-14 DIAGNOSIS — H401232 Low-tension glaucoma, bilateral, moderate stage: Secondary | ICD-10-CM | POA: Diagnosis not present

## 2023-01-14 DIAGNOSIS — H04123 Dry eye syndrome of bilateral lacrimal glands: Secondary | ICD-10-CM | POA: Diagnosis not present

## 2023-01-14 DIAGNOSIS — H26493 Other secondary cataract, bilateral: Secondary | ICD-10-CM | POA: Diagnosis not present

## 2023-02-07 ENCOUNTER — Other Ambulatory Visit: Payer: Self-pay | Admitting: Family Medicine

## 2023-02-07 DIAGNOSIS — M19012 Primary osteoarthritis, left shoulder: Secondary | ICD-10-CM | POA: Diagnosis not present

## 2023-02-07 DIAGNOSIS — M19011 Primary osteoarthritis, right shoulder: Secondary | ICD-10-CM | POA: Diagnosis not present

## 2023-02-10 ENCOUNTER — Encounter: Payer: Self-pay | Admitting: Family Medicine

## 2023-02-10 ENCOUNTER — Ambulatory Visit (INDEPENDENT_AMBULATORY_CARE_PROVIDER_SITE_OTHER): Payer: Medicare HMO | Admitting: Family Medicine

## 2023-02-10 VITALS — BP 120/70 | HR 63 | Temp 98.5°F | Wt 158.4 lb

## 2023-02-10 DIAGNOSIS — R300 Dysuria: Secondary | ICD-10-CM

## 2023-02-10 DIAGNOSIS — N390 Urinary tract infection, site not specified: Secondary | ICD-10-CM | POA: Diagnosis not present

## 2023-02-10 LAB — POC URINALSYSI DIPSTICK (AUTOMATED)
Bilirubin, UA: NEGATIVE
Blood, UA: NEGATIVE
Glucose, UA: NEGATIVE
Ketones, UA: NEGATIVE
Nitrite, UA: NEGATIVE
Protein, UA: NEGATIVE
Spec Grav, UA: 1.015 (ref 1.010–1.025)
Urobilinogen, UA: 0.2 E.U./dL
pH, UA: 6 (ref 5.0–8.0)

## 2023-02-10 MED ORDER — NITROFURANTOIN MONOHYD MACRO 100 MG PO CAPS: 100.0000 mg | ORAL_CAPSULE | Freq: Two times a day (BID) | ORAL | 0 refills | Status: DC

## 2023-02-10 NOTE — Progress Notes (Signed)
   Subjective:    Patient ID: Megan Reilly, female    DOB: 28-Mar-1941, 82 y.o.   MRN: [id number]  HPI Here for 3 days of urinary urgency and burning. No fever or nausea. She drinks plenty of water and cranberry juice. She had a UTI last November with an Enterococcus faecalis that was sensitive to Nitrofurantoin.    Review of Systems  Constitutional: Negative.   Respiratory: Negative.    Cardiovascular: Negative.   Gastrointestinal: Negative.   Genitourinary:  Positive for dysuria, frequency and urgency. Negative for flank pain and hematuria.       Objective:   Physical Exam Constitutional:      Appearance: Normal appearance. She is not ill-appearing.  Cardiovascular:     Rate and Rhythm: Normal rate and regular rhythm.     Pulses: Normal pulses.     Heart sounds: Normal heart sounds.  Pulmonary:     Effort: Pulmonary effort is normal.     Breath sounds: Normal breath sounds.  Abdominal:     Tenderness: There is no right CVA tenderness or left CVA tenderness.  Neurological:     Mental Status: She is alert.           Assessment & Plan:  UTI, treat with 7 days of Nitrofurantoin. Culture the sample.  Gershon Crane, MD

## 2023-02-11 LAB — URINE CULTURE
MICRO NUMBER:: 14905163
Result:: NO GROWTH
SPECIMEN QUALITY:: ADEQUATE

## 2023-02-22 ENCOUNTER — Telehealth: Payer: Self-pay

## 2023-02-22 DIAGNOSIS — M810 Age-related osteoporosis without current pathological fracture: Secondary | ICD-10-CM

## 2023-02-22 DIAGNOSIS — Z13820 Encounter for screening for osteoporosis: Secondary | ICD-10-CM

## 2023-02-22 NOTE — Telephone Encounter (Signed)
Per appt desk: pt received her DEXA recall in mail and contacted our office yesterday to schedule. However, is due after 04/22/2023 and unfortunately that is the last week in our office that we will be offering DEXA scans until further notice. So, pt will have to receive scan at an alternate location.   Last AEX 02/2022, pt is low risk medicare and is not currently prescribed any medications by Dr. Seymour Bars. B&P not due until 02/2024 (recall placed.   Ok to send DEXA order to location of pt's choice/preference? Please advise.

## 2023-02-23 NOTE — Telephone Encounter (Signed)
Per ML: "Yes, please send BD orders Dr. Elbert Ewings"   Pt notified and voiced understanding. However, pt mentioned that in the past, she has been told by one of her providers that since she has a hx of osteoporosis, she can receive a DEXA scan qyr if desired.   I then advised pt to contact her insurance and confirm that if she were to receive her DEXA scan prior to 04/22/2023, that they would cover it for her due to her history. She voiced understanding and is agreeable, will call us back once she is aware.

## 2023-02-23 NOTE — Telephone Encounter (Signed)
Pt scheduled for DEXA on 04/19/2023. Pt reported that she would like to see ML prior to her departure to go over her results from DEXA and recommendations moving forward. Pt advised that more often than not, we call pt's with results/recommendations. However, I advised her that I will inquire if there is any availability in Dr. Sharol Roussel schedule prior to 04/22/23, would you mind seeing her for discussion? Thanks.

## 2023-02-23 NOTE — Telephone Encounter (Signed)
Per ML: "Agree if patient prefers that. Dr. Elbert Ewings"  Will send msg to appt desk to contact her.

## 2023-02-23 NOTE — Telephone Encounter (Signed)
FYI. Pt states that her insurance confirms that they will approve for her to have an DEXA scan performed prior to the 04/22/2023 date if we can get her scheduled for one in office prior to Dr. Sharol Roussel departure. Please authorize if ok and will notify appt desk to contact her. Thanks.

## 2023-02-24 NOTE — Telephone Encounter (Signed)
Per ML: "Yes, you can schedule her for a visit with me, soon after her BD. Dr L"   Will send msg to appt desk.

## 2023-02-24 NOTE — Telephone Encounter (Signed)
FYI. Pt scheduled for same day as DEXA scan. Will notify Jasmine December next time she is in office. Will close encounter.

## 2023-03-09 ENCOUNTER — Other Ambulatory Visit: Payer: Self-pay | Admitting: Family Medicine

## 2023-03-10 DIAGNOSIS — M2011 Hallux valgus (acquired), right foot: Secondary | ICD-10-CM | POA: Diagnosis not present

## 2023-03-11 ENCOUNTER — Ambulatory Visit: Payer: Medicare HMO | Admitting: Family Medicine

## 2023-03-18 ENCOUNTER — Ambulatory Visit: Payer: Medicare HMO | Admitting: Family Medicine

## 2023-03-28 ENCOUNTER — Encounter: Payer: Self-pay | Admitting: Internal Medicine

## 2023-03-28 ENCOUNTER — Ambulatory Visit (INDEPENDENT_AMBULATORY_CARE_PROVIDER_SITE_OTHER): Payer: Medicare HMO | Admitting: Internal Medicine

## 2023-03-28 VITALS — BP 120/70 | HR 75 | Temp 98.0°F | Wt 163.8 lb

## 2023-03-28 DIAGNOSIS — J069 Acute upper respiratory infection, unspecified: Secondary | ICD-10-CM

## 2023-03-28 DIAGNOSIS — R059 Cough, unspecified: Secondary | ICD-10-CM | POA: Diagnosis not present

## 2023-03-28 LAB — POC COVID19 BINAXNOW: SARS Coronavirus 2 Ag: NEGATIVE

## 2023-03-28 NOTE — Progress Notes (Signed)
Established Patient Office Visit     CC/Reason for Visit: URI symptoms  HPI: Megan Reilly is a 82 y.o. female who is coming in today for the above mentioned reasons.  2 days ago upon returning from a beach vacation she started experiencing postnasal drip, nonproductive cough and malaise.  She had same symptoms previously when she tested positive for COVID so wanted to come in for the same.  No sick contacts.   Past Medical/Surgical History: Past Medical History:  Diagnosis Date   Allergy    Arthritis    Blood transfusion abn reaction or complication, no procedure mishap    82 years old after tonsilectomy   Hypertension    Osteoporosis 04/2019   T score -2.1 improved from prior DEXA   Tibia fracture    left   Vasovagal syncope    2 prior incidences last was in 2015 ; treated in ED , reports no recurrence since then     Past Surgical History:  Procedure Laterality Date   COLONOSCOPY     FOOT SURGERY     bunectomy, hammertoe, bil   LAPAROSCOPIC BILATERAL SALPINGO OOPHERECTOMY     TONSILLECTOMY  10/12/1943   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 She has never used smokeless tobacco. She reports current alcohol use. She reports that she does not use drugs.  Allergies: Allergies  Allergen Reactions   Benadryl [Diphenhydramine] Other (See Comments)    Light headed   Hctz [Hydrochlorothiazide] Other (See Comments)    Fatigue, body aches    Family History:  Family History  Problem Relation Age of Onset   Heart disease Mother    Hyperlipidemia Mother    Stroke Mother    Heart failure Mother     Diabetes Father        type ll   Heart disease Father 48       sudden death age 47   Hyperlipidemia Father    Breast cancer Maternal Aunt      Current Outpatient Medications:    amLODipine (NORVASC) 5 MG tablet, TAKE 1 TABLET (5 MG TOTAL) BY MOUTH DAILY., Disp: 90 tablet, Rfl: 0   calcium carbonate (OS-CAL) 600 MG TABS, Take 300 mg by mouth every other day., Disp: , Rfl:    cetirizine (ZYRTEC) 10 MG tablet, Take 10 mg by mouth daily as needed for allergies., Disp: , Rfl:    Cholecalciferol (VITAMIN D-3 PO), Take 2,500 Units by mouth daily. , Disp: , Rfl:    fish oil-omega-3 fatty acids 1000 MG capsule, Take 1 g by mouth daily. , Disp: , Rfl:    fluticasone (FLONASE) 50 MCG/ACT nasal spray, SPRAY 1 TO 2 SPARYS IN EACH NOSTRIL DAILY., Disp: 48 mL, Rfl: 2   latanoprost (XALATAN) 0.005 % ophthalmic solution, 1 drop at bedtime., Disp: , Rfl:    losartan (COZAAR) 50 MG tablet, Take 1 tablet (50 mg total) by mouth daily., Disp: 90 tablet, Rfl: 3   Magnesium 400 MG CAPS, Take 1 tablet by mouth daily. , Disp: , Rfl:    Melatonin 3 MG TABS, Take 3 mg by mouth. 3-4 times a  week, Disp: , Rfl:    Multiple Vitamin (MULTIVITAMIN WITH MINERALS) TABS tablet, Take 1 tablet by mouth every morning., Disp: , Rfl:    MYRBETRIQ 50 MG TB24 tablet, TAKE 1 TABLET BY MOUTH EVERY DAY, Disp: 30 tablet, Rfl: 5   nitrofurantoin, macrocrystal-monohydrate, (MACROBID) 100 MG capsule, Take 1 capsule (100 mg total) by mouth 2 (two) times daily., Disp: 14 capsule, Rfl: 0  Review of Systems:  Negative unless indicated in HPI.   Physical Exam: Vitals:   03/28/23 1508  BP: 120/70  Pulse: 75  Temp: 98 F (36.7 C)  TempSrc: Oral  SpO2: 99%  Weight: 163 lb 12.8 oz (74.3 kg)    Body mass index is 24.91 kg/m.   Physical Exam Vitals reviewed.  Constitutional:      Appearance: Normal appearance.  HENT:     Right Ear: Tympanic membrane, ear canal and external ear normal.     Left Ear: Tympanic membrane, ear canal  and external ear normal.     Mouth/Throat:     Mouth: Mucous membranes are moist.     Pharynx: Oropharynx is clear. Posterior oropharyngeal erythema present.  Eyes:     Conjunctiva/sclera: Conjunctivae normal.     Pupils: Pupils are equal, round, and reactive to light.  Cardiovascular:     Rate and Rhythm: Normal rate and regular rhythm.  Pulmonary:     Effort: Pulmonary effort is normal.     Breath sounds: Normal breath sounds.  Neurological:     Mental Status: She is alert.      Impression and Plan:  Cough, unspecified type -     POC COVID-19 BinaxNow  Viral upper respiratory tract infection  -In office COVID test was negative.  Since she is early in the course of her illness, have advised that she retest for COVID tomorrow at home and notify us if positive. -Given exam findings, PNA, pharyngitis, ear infection are not likely, hence abx have not been prescribed. -Have advised rest, fluids, OTC antihistamines, cough suppressants and mucinex. -RTC if no improvement in 10-14 days.    Time spent:22 minutes reviewing chart, interviewing and examining patient and formulating plan of care.     Chaya Jan, MD [facility]

## 2023-04-05 ENCOUNTER — Ambulatory Visit (INDEPENDENT_AMBULATORY_CARE_PROVIDER_SITE_OTHER): Payer: Medicare HMO | Admitting: Family Medicine

## 2023-04-05 ENCOUNTER — Encounter: Payer: Self-pay | Admitting: Family Medicine

## 2023-04-05 VITALS — BP 134/70 | HR 65 | Temp 98.1°F | Ht 68.0 in | Wt 158.4 lb

## 2023-04-05 DIAGNOSIS — J209 Acute bronchitis, unspecified: Secondary | ICD-10-CM

## 2023-04-05 DIAGNOSIS — R051 Acute cough: Secondary | ICD-10-CM | POA: Diagnosis not present

## 2023-04-05 DIAGNOSIS — R5383 Other fatigue: Secondary | ICD-10-CM | POA: Diagnosis not present

## 2023-04-05 MED ORDER — BENZONATATE 100 MG PO CAPS: 100.0000 mg | ORAL_CAPSULE | Freq: Three times a day (TID) | ORAL | 0 refills | Status: DC | PRN

## 2023-04-05 NOTE — Progress Notes (Signed)
Established Patient Office Visit  Subjective   Patient ID: Megan Reilly, female    DOB: Mar 27, 1941  Age: 82 y.o. MRN: [id number]  Chief Complaint  Patient presents with   Medical Management of Chronic Issues   Cough    Patient complains of cough, x1 week, Nonproductive, Tried Tylenol    HPI   [name] has chronic problems including history of hypertension, remote history of subarachnoid bleed following injury, osteoporosis, osteoarthritis involving multiple joints.  She is seen today for the following issues  Still has some cough.  She had recent beach trip with her family and when she returned a week ago Sunday had some cough and congestion.  COVID test here negative.  She was seen by another provider and felt this was either viral URI versus allergies.  She continues to have some cough which is mostly nonproductive and especially early mornings.  No hemoptysis.  No fever.  No chills.  No dyspnea.  Still feels significant amount of fatigue.  Denies any current dysuria.  No known sick contacts.  She tried over-the-counter cough medications without much improvement.  She has hypertension treated with amlodipine and losartan.  After addition of losartan her blood pressures have been fairly well-controlled.  No significant orthostatic symptoms.  Currently not exercising much.  No recent falls.  No significant dizziness or syncope.    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 She has never used smokeless tobacco. She reports current alcohol use. She reports that she does not use drugs. family history includes Breast cancer in her maternal aunt; Diabetes in her father; Heart disease in her mother; Heart disease (age of onset: 12) in her father; Heart failure in her mother; Hyperlipidemia in her father and mother; Stroke in her mother. Allergies  Allergen Reactions   Benadryl [Diphenhydramine] Other (See Comments)    Light headed   Hctz [Hydrochlorothiazide] Other (See Comments)    Fatigue, body aches    Review of Systems  Constitutional:  Positive for malaise/fatigue. Negative for chills, fever and weight loss.  Eyes:  Negative for blurred vision.  Respiratory:  Positive for cough. Negative for hemoptysis, shortness of breath and wheezing.   Cardiovascular:  Negative for chest pain and leg swelling.  Neurological:  Negative for dizziness, weakness and headaches.      Objective:     BP 134/70 (BP Location: Left Arm, Patient Position: Sitting, Cuff Size: Normal)   Pulse 65  Temp 98.1 F (36.7 C) (Oral)   Ht 5\' 8"  (1.727 m)   Wt 158 lb 6.4 oz (71.8 kg)   SpO2 98%   BMI 24.08 kg/m  BP Readings from Last 3 Encounters:  04/05/23 134/70  03/28/23 120/70  02/10/23 120/70   Wt Readings from Last 3 Encounters:  04/05/23 158 lb 6.4 oz (71.8 kg)  03/28/23 163 lb 12.8 oz (74.3 kg)  02/10/23 158 lb 6.4 oz (71.8 kg)      Physical Exam Vitals reviewed.  Constitutional:      General: She is not in acute distress.    Appearance: Normal appearance.  She is not toxic-appearing.  Cardiovascular:     Rate and Rhythm: Normal rate and regular rhythm.  Pulmonary:     Effort: Pulmonary effort is normal.     Breath sounds: Normal breath sounds. No wheezing or rales.  Musculoskeletal:     Right lower leg: No edema.     Left lower leg: No edema.  Neurological:     Mental Status: She is alert.      No results found for any visits on 04/05/23.  Last CBC Lab Results  Component Value Date   WBC 11.0 (H) 08/10/2022   HGB 11.3 (L) 08/10/2022   HCT 34.1 (L) 08/10/2022   MCV 98.0 08/10/2022   MCH 32.5 08/10/2022   RDW 12.0 08/10/2022   PLT 169 08/10/2022   Last metabolic panel Lab Results  Component Value Date   GLUCOSE 96 11/10/2022   NA 140 11/10/2022   K 4.3 11/10/2022   CL 106 11/10/2022   CO2 28 11/10/2022   BUN 17 11/10/2022   CREATININE 0.69 11/10/2022   GFRNONAA >60 08/10/2022   CALCIUM 10.4 11/10/2022   PHOS 2.9 12/10/2013   PROT 6.4 10/24/2020   ALBUMIN 4.6 10/24/2020   BILITOT 0.9 10/24/2020   ALKPHOS 55 10/24/2020   AST 20 10/24/2020   ALT 15 10/24/2020   ANIONGAP 8 08/10/2022   Last lipids Lab Results  Component Value Date   CHOL 194 10/24/2020   HDL 67.10 10/24/2020   LDLCALC 108 (H) 10/24/2020   LDLDIRECT 139.3 02/02/2011   TRIG 95.0 10/24/2020   CHOLHDL 3 10/24/2020   Last hemoglobin A1c Lab Results  Component Value Date   HGBA1C 5.8 (H) 12/10/2013   Last thyroid functions Lab Results  Component Value Date   TSH 2.25 10/24/2020      The ASCVD Risk score (Arnett DK, et al., 2019) failed to calculate for the following reasons:   The 2019 ASCVD risk score is only valid for ages 37 to 17    Assessment & Plan:   #1 cough.  Suspect recent acute viral bronchitis.  Nonfocal exam.  Does not any red flags such as fever, chills, dyspnea.  We explained that average duration of cough for acute bronchitis is about 3 weeks. -Tessalon Perles 100 mg every 8 hours as needed for cough -Follow-up  immediately for any fever or increased shortness of breath or if cough not resolving over the next couple weeks  #2 hypertension stable on combination therapy with amlodipine 5 mg daily and losartan 50 mg daily.  Recent basic metabolic panel stable.  Continue close monitoring.  Continue low-sodium diet.  #3 fatigue- ? Secondary to #1/recent illness.  Non-focal exam.  If symptoms  not improving over next few weeks follow up for some labs with CBC, TSH, CMP.    Return in about 4 months (around 08/05/2023).    Smitty Cords  Elease Hashimoto, MD

## 2023-04-05 NOTE — Patient Instructions (Signed)
Follow up for any fever or increased shortness of breath. 

## 2023-04-08 ENCOUNTER — Other Ambulatory Visit: Payer: Self-pay | Admitting: Family Medicine

## 2023-04-11 ENCOUNTER — Telehealth: Payer: Self-pay | Admitting: *Deleted

## 2023-04-11 ENCOUNTER — Ambulatory Visit (INDEPENDENT_AMBULATORY_CARE_PROVIDER_SITE_OTHER): Payer: Medicare HMO

## 2023-04-11 ENCOUNTER — Encounter: Payer: Self-pay | Admitting: Family Medicine

## 2023-04-11 ENCOUNTER — Ambulatory Visit (INDEPENDENT_AMBULATORY_CARE_PROVIDER_SITE_OTHER): Payer: Medicare HMO | Admitting: Family Medicine

## 2023-04-11 VITALS — BP 118/60 | HR 77 | Temp 98.3°F | Ht 68.0 in | Wt 162.2 lb

## 2023-04-11 DIAGNOSIS — R1084 Generalized abdominal pain: Secondary | ICD-10-CM | POA: Diagnosis not present

## 2023-04-11 DIAGNOSIS — R3989 Other symptoms and signs involving the genitourinary system: Secondary | ICD-10-CM

## 2023-04-11 DIAGNOSIS — R053 Chronic cough: Secondary | ICD-10-CM

## 2023-04-11 DIAGNOSIS — R6883 Chills (without fever): Secondary | ICD-10-CM

## 2023-04-11 LAB — URINALYSIS, ROUTINE W REFLEX MICROSCOPIC
Bilirubin Urine: NEGATIVE
Ketones, ur: NEGATIVE
Nitrite: NEGATIVE
Specific Gravity, Urine: <=1.005 — AB (ref 1.000–1.030)
Total Protein, Urine: 30 — AB
Urine Glucose: NEGATIVE
Urobilinogen, UA: 1 (ref 0.0–1.0)
pH: 6 (ref 5.0–8.0)

## 2023-04-11 LAB — CBC WITH DIFFERENTIAL/PLATELET
Basophils Absolute: 0.1 10*3/uL (ref 0.0–0.1)
Basophils Relative: 0.3 % (ref 0.0–3.0)
Eosinophils Absolute: 0.2 10*3/uL (ref 0.0–0.7)
Eosinophils Relative: 0.8 % (ref 0.0–5.0)
HCT: 36 % (ref 36.0–46.0)
Hemoglobin: 11.9 g/dL — ABNORMAL LOW (ref 12.0–15.0)
Lymphocytes Relative: 6.5 % — ABNORMAL LOW (ref 12.0–46.0)
Lymphs Abs: 1.2 10*3/uL (ref 0.7–4.0)
MCHC: 32.9 g/dL (ref 30.0–36.0)
MCV: 96.9 fl (ref 78.0–100.0)
Monocytes Absolute: 2.3 10*3/uL — ABNORMAL HIGH (ref 0.1–1.0)
Monocytes Relative: 12 % (ref 3.0–12.0)
Neutro Abs: 15.3 10*3/uL — ABNORMAL HIGH (ref 1.4–7.7)
Neutrophils Relative %: 80.4 % — ABNORMAL HIGH (ref 43.0–77.0)
Platelets: 214 10*3/uL (ref 150.0–400.0)
RBC: 3.72 Mil/uL — ABNORMAL LOW (ref 3.87–5.11)
RDW: 13.3 % (ref 11.5–15.5)
WBC: 19 10*3/uL (ref 4.0–10.5)

## 2023-04-11 LAB — COMPREHENSIVE METABOLIC PANEL
ALT: 115 U/L — ABNORMAL HIGH (ref 0–35)
AST: 100 U/L — ABNORMAL HIGH (ref 0–37)
Albumin: 3.3 g/dL — ABNORMAL LOW (ref 3.5–5.2)
Alkaline Phosphatase: 475 U/L — ABNORMAL HIGH (ref 39–117)
BUN: 27 mg/dL — ABNORMAL HIGH (ref 6–23)
CO2: 27 mEq/L (ref 19–32)
Calcium: 10.9 mg/dL — ABNORMAL HIGH (ref 8.4–10.5)
Chloride: 95 mEq/L — ABNORMAL LOW (ref 96–112)
Creatinine, Ser: 1.18 mg/dL (ref 0.40–1.20)
GFR: 43.08 mL/min — ABNORMAL LOW (ref >60.00)
Glucose, Bld: 126 mg/dL — ABNORMAL HIGH (ref 70–99)
Potassium: 4.1 mEq/L (ref 3.5–5.1)
Sodium: 130 mEq/L — ABNORMAL LOW (ref 135–145)
Total Bilirubin: 1.9 mg/dL — ABNORMAL HIGH (ref 0.2–1.2)
Total Protein: 6.4 g/dL (ref 6.0–8.3)

## 2023-04-11 MED ORDER — AMOXICILLIN-POT CLAVULANATE 875-125 MG PO TABS
1.0000 | ORAL_TABLET | Freq: Two times a day (BID) | ORAL | 0 refills | Status: DC
Start: 2023-04-11 — End: 2023-04-15

## 2023-04-11 NOTE — Progress Notes (Signed)
Acute Office Visit  Subjective:     Patient ID: Megan Reilly, female    DOB: 1941/07/22, 82 y.o.   MRN: [id number]  Chief Complaint  Patient presents with   Chills    X3 days   Night Sweats    X3 days, states temperature was normal   Nausea   Fatigue    HPI  Patient is in today for new symptoms of chills and night sweats. States she cam home from the beach about 2 weeks ago, she called the office and she saw Dr. Ardyth Harps, covid testing was negative, recommended OTC medications. She continued to have coughing saw Dr. Caryl Never 1 week later, was given benzonatate. States that her cough was better, but she still didn't feel good, was very weak, could hardly walk, was sleeping, had night sweats, dry heaves. No appetite. Has noticed dark and strong smelling urine. States that her appetite has been suppressed. No sinus pressure or pain, states that she initially had a stuffy nose but this has improved.   No chest pain, no difficulty breathing, no abdominal pain, no diarrhea or abnormal BM's.   Review of Systems  Constitutional:  Positive for chills and malaise/fatigue.  Respiratory:  Positive for cough. Negative for sputum production.   Cardiovascular:  Negative for chest pain.  Gastrointestinal:  Positive for nausea. Negative for abdominal pain and diarrhea.  Neurological:  Positive for weakness.        Objective:    BP 118/60 (BP Location: Left Arm, Patient Position: Sitting, Cuff Size: Normal)   Pulse 77   Temp 98.3 F (36.8 C) (Oral)   Ht 5\' 8"  (1.727 m)   Wt 162 lb 3.2 oz (73.6 kg)   SpO2 98%   BMI 24.66 kg/m    Physical Exam Vitals reviewed.  Constitutional:      Appearance: Normal appearance. She is well-groomed and normal weight.  HENT:     Right Ear: Tympanic membrane and ear canal normal.     Left Ear: Tympanic membrane and ear canal normal.     Mouth/Throat:     Mouth: Mucous membranes are moist.     Pharynx: No posterior oropharyngeal erythema.   Eyes:     Conjunctiva/sclera: Conjunctivae normal.  Neck:     Thyroid: No thyromegaly.  Cardiovascular:     Rate and Rhythm: Normal rate and regular rhythm.     Pulses: Normal pulses.     Heart sounds: S1 normal and S2 normal.  Pulmonary:     Effort: Pulmonary effort is normal.     Breath sounds: Normal breath sounds and air entry. No wheezing, rhonchi or rales.  Musculoskeletal:     Right lower leg: No edema.     Left lower leg: No edema.  Lymphadenopathy:     Cervical: No cervical adenopathy.  Neurological:     Mental Status: She is alert and oriented to person, place, and time. Mental status is at baseline.     Gait: Gait is intact.  Psychiatric:        Mood and Affect: Mood and affect normal.        Speech: Speech normal.        Behavior: Behavior normal.        Judgment: Judgment normal.     No results found for any visits on 04/11/23.      Assessment & Plan:   Problem List Items Addressed This Visit   None Visit Diagnoses     Chills    -  Primary   Relevant Medications   amoxicillin-clavulanate (AUGMENTIN) 875-125 MG tablet   Other Relevant Orders   CBC with Differential/Platelets   CMP   DG Chest 2 View   Persistent cough       Relevant Medications   amoxicillin-clavulanate (AUGMENTIN) 875-125 MG tablet   Other Relevant Orders   CBC with Differential/Platelets   DG Chest 2 View   Urine discoloration       Relevant Orders   Urinalysis     Physical exam is relatively benign, UA was unable to be obtained due to lack of urine production from the patient, which confirms dehydration.   Given the persistence of the cough and the addition of new symptoms of generalized weakness, fatigue, chills and night sweats I will be ordering a CXR CBC and CMP to rule out pneumonia and other causes of her symptoms. Will treat empirically today with augmentin for any upper respiratory pathogens. Pt states she may come back to give a urine later.   Meds ordered this  encounter  Medications   amoxicillin-clavulanate (AUGMENTIN) 875-125 MG tablet    Sig: Take 1 tablet by mouth 2 (two) times daily for 10 days.    Dispense:  20 tablet    Refill:  0    No follow-ups on file.  Karie Georges, MD

## 2023-04-11 NOTE — Telephone Encounter (Signed)
CRITICAL VALUE STICKER  CRITICAL VALUE: WBC 19.0  RECEIVER (on-site recipient of call): Ronnald Collum  DATE & TIME NOTIFIED: 04/11/2023 at 3:57pm  MESSENGER (representative from lab):[facility]  MD NOTIFIED:Dr Casimiro Needle  TIME OF NOTIFICATION:3:57pm  RESPONSE:

## 2023-04-11 NOTE — Telephone Encounter (Signed)
Got it, gave pt abx at the visit.

## 2023-04-11 NOTE — Telephone Encounter (Signed)
Noted  

## 2023-04-12 ENCOUNTER — Encounter: Payer: Self-pay | Admitting: Family Medicine

## 2023-04-12 ENCOUNTER — Ambulatory Visit
Admission: RE | Admit: 2023-04-12 | Discharge: 2023-04-12 | Disposition: A | Discharge status: Home / Self Care | Payer: Medicare HMO | Source: Ambulatory Visit | Attending: Family Medicine | Admitting: Family Medicine

## 2023-04-12 DIAGNOSIS — K5730 Diverticulosis of large intestine without perforation or abscess without bleeding: Secondary | ICD-10-CM | POA: Diagnosis not present

## 2023-04-12 DIAGNOSIS — R109 Unspecified abdominal pain: Secondary | ICD-10-CM | POA: Diagnosis not present

## 2023-04-12 DIAGNOSIS — N2889 Other specified disorders of kidney and ureter: Secondary | ICD-10-CM | POA: Diagnosis not present

## 2023-04-12 DIAGNOSIS — R1084 Generalized abdominal pain: Secondary | ICD-10-CM

## 2023-04-12 DIAGNOSIS — I700 Atherosclerosis of aorta: Secondary | ICD-10-CM | POA: Diagnosis not present

## 2023-04-12 NOTE — Addendum Note (Signed)
Addended by: Karie Georges on: 04/12/2023 08:20 AM   Modules accepted: Orders

## 2023-04-12 NOTE — Progress Notes (Signed)
CT abd was relatively good -- I spoke with the patient at home and we discussed the results, she reports that she is feeling some better this afternoon with the antibiotics, she ate a full lunch and tolerated it, urine is getting lighter in color.  I advised that she follow up with Burchette in 1-2 weeks so that he can repeat the CMP and CBC to make sure it is getting better.

## 2023-04-15 ENCOUNTER — Telehealth: Payer: Self-pay | Admitting: Family Medicine

## 2023-04-15 MED ORDER — DOXYCYCLINE HYCLATE 100 MG PO TABS: ORAL_TABLET | ORAL | 0 refills | Status: DC

## 2023-04-15 NOTE — Telephone Encounter (Signed)
I spoke with the patient and she confirmed no recent tick bites.

## 2023-04-15 NOTE — Telephone Encounter (Signed)
Patient informed of the message below and voiced understanding. RX sent and appt scheduled for 04/18/2023

## 2023-04-15 NOTE — Telephone Encounter (Signed)
Pt still feeling lethargic, night sweats, still taking antibiotic. Would like guidance on what she can do to feel better, has ov scheduled for 04/22/23

## 2023-04-18 ENCOUNTER — Ambulatory Visit (INDEPENDENT_AMBULATORY_CARE_PROVIDER_SITE_OTHER): Payer: Medicare HMO | Admitting: Family Medicine

## 2023-04-18 ENCOUNTER — Encounter: Payer: Self-pay | Admitting: Family Medicine

## 2023-04-18 VITALS — BP 150/76 | HR 80 | Temp 98.9°F | Ht 68.0 in

## 2023-04-18 DIAGNOSIS — R7401 Elevation of levels of liver transaminase levels: Secondary | ICD-10-CM

## 2023-04-18 DIAGNOSIS — D72829 Elevated white blood cell count, unspecified: Secondary | ICD-10-CM

## 2023-04-18 DIAGNOSIS — R6883 Chills (without fever): Secondary | ICD-10-CM | POA: Diagnosis not present

## 2023-04-18 NOTE — Progress Notes (Signed)
Established Patient Office Visit  Subjective   Patient ID: Megan Reilly, female    DOB: [birth year]  Age: 82 y.o. MRN: [id number]  Chief Complaint  Patient presents with   Medical Management of Chronic Issues   Diarrhea    HPI   Megan Reilly is seen companied by son for recent prolonged illness.  She was seen here initially June 17 with cough with COVID testing negative.  Subsequently seen on the 25th with some persistent cough with nonfocal exam and prescribed Tessalon.  She was then seen with some increasing chills and feeling worse on 1 July.  In addition to the chills and cough she had some urine discoloration.  Chest x-ray showed no acute disease and no definite pneumonia.  Her urinalysis showed moderate hemoglobin, large leukocytes, and many bacteria.  White blood count was 19,000.  She had elevated liver transaminases with elevated alkaline phosphatase.  She had a CT of the abdomen and pelvis the next day which showed no acute findings.  There was comment of some urothelial thickening centrally right renal collecting system without hydronephrosis.  Comment of descending and sigmoid diverticulosis without diverticulitis.  Patient continues to feel poorly with some intermittent constipation diarrhea and intermittent chills without documented fever and call back Friday.  We had her stop Augmentin at that point start doxycycline.  She feels somewhat better at this point.  She does not recall any recent tick bites.  No skin rash.  Still has some cough but slightly better.  Cough nonproductive.  No localizing abdominal pain.  No headaches.  No sinusitis symptoms.  Past Medical History:  Diagnosis Date   Allergy    Arthritis    Blood transfusion abn reaction or complication, no procedure mishap    82 years old after tonsilectomy   Hypertension    Osteoporosis 04/2019   T score -2.1 improved from prior DEXA   Tibia fracture    left   Vasovagal syncope    2 prior incidences last was in  2015 ; treated in ED , reports no recurrence since then    Past Surgical History:  Procedure Laterality Date   COLONOSCOPY     FOOT SURGERY     bunectomy, hammertoe, bil   LAPAROSCOPIC BILATERAL SALPINGO OOPHERECTOMY     TONSILLECTOMY  10/12/1943   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   Procedure: RIGHT TOTAL KNEE ARTHROPLASTY;  Surgeon: Ollen Gross, MD;  Location: [facility];  Service: Orthopedics;  Laterality: Right;   TOTAL KNEE ARTHROPLASTY Left 08/09/2022   Procedure: TOTAL KNEE ARTHROPLASTY;  Surgeon: Ollen Gross, MD;  Location: [facility];  Service: Orthopedics;  Laterality: Left;    reports that she has never smoked. She has never used smokeless tobacco. She reports current alcohol use. She reports that she does not use drugs. family history includes Breast cancer in her maternal aunt; Diabetes in her father; Heart disease in her mother; Heart disease (age of onset: 78) in her father; Heart failure in her mother; Hyperlipidemia in her father and mother; Stroke in her mother. Allergies  Allergen Reactions   Benadryl [Diphenhydramine] Other (See Comments)    Light headed   Hctz [Hydrochlorothiazide] Other (See Comments)    Fatigue, body aches    Review of Systems  Constitutional:  Positive for chills and malaise/fatigue.  HENT:  Negative for sore throat.   Respiratory:  Positive for cough. Negative for hemoptysis, sputum production and shortness of breath.   Cardiovascular:  Negative for chest pain.  Gastrointestinal:  Negative for abdominal pain, nausea and vomiting.  Genitourinary:  Negative for dysuria, flank pain, frequency and hematuria.      Objective:     BP (!) 150/76 (BP Location: Left Arm, Patient Position: Sitting, Cuff Size: Normal)   Pulse 80   Temp 98.9 F (37.2 C) (Oral)   Ht 5\' 8"  (1.727 m)   SpO2 98%   BMI 24.66 kg/m  BP Readings from Last 3 Encounters:  04/18/23 (!) 150/76  04/11/23 118/60  04/05/23 134/70   Wt Readings from Last 3  Encounters:  04/11/23 162 lb 3.2 oz (73.6 kg)  04/05/23 158 lb 6.4 oz (71.8 kg)  03/28/23 163 lb 12.8 oz (74.3 kg)      Physical Exam Vitals reviewed.  Constitutional:      General: She is not in acute distress.    Appearance: Normal appearance. She is not ill-appearing or toxic-appearing.  HENT:     Mouth/Throat:     Mouth: Mucous membranes are moist.     Pharynx: Oropharynx is clear.  Cardiovascular:     Rate and Rhythm: Normal rate and regular rhythm.  Pulmonary:     Effort: Pulmonary effort is normal.     Breath sounds: Normal breath sounds. No wheezing or rales.  Abdominal:     Palpations: Abdomen is soft. There is no mass.     Tenderness: There is no abdominal tenderness. There is no guarding or rebound.  Musculoskeletal:     Cervical back: Neck supple.     Right lower leg: No edema.     Left lower leg: No edema.  Lymphadenopathy:     Cervical: No cervical adenopathy.  Skin:    Findings: No rash.  Neurological:     Mental Status: She is alert.      No results found for any visits on 04/18/23.  Last CBC Lab Results  Component Value Date   WBC 19.0 Repeated and verified X2. (HH) 04/11/2023   HGB 11.9 (L) 04/11/2023   HCT 36.0 04/11/2023   MCV 96.9 04/11/2023   MCH 32.5 08/10/2022   RDW 13.3 04/11/2023   PLT 214.0 04/11/2023   Last metabolic panel Lab Results  Component Value Date   GLUCOSE 126 (H) 04/11/2023   NA 130 (L) 04/11/2023   K 4.1 04/11/2023   CL 95 (L) 04/11/2023   CO2 27 04/11/2023   BUN 27 (H) 04/11/2023   CREATININE 1.18 04/11/2023   GFR 43.08 (L) 04/11/2023   CALCIUM 10.9 (H) 04/11/2023   PHOS 2.9 12/10/2013   PROT 6.4 04/11/2023   ALBUMIN 3.3 (L) 04/11/2023   BILITOT 1.9 (H) 04/11/2023   ALKPHOS 475 (H) 04/11/2023   AST 100 (H) 04/11/2023   ALT 115 (H) 04/11/2023   ANIONGAP 8 08/10/2022      The ASCVD Risk score (Arnett DK, et al., 2019) failed to calculate for the following reasons:   The 2019 ASCVD risk score is only  valid for ages 1 to 39    Assessment & Plan:   Problem List Items Addressed This Visit   None Visit Diagnoses     Leukocytosis, unspecified type    -  Primary   Relevant Orders   CBC with Differential/Platelet   B. burgdorfi Antibody   Rocky mtn spotted fvr abs pnl(IgG+IgM)   Transaminasemia       Relevant Orders   CMP   Chills       Relevant Orders   Urinalysis with Culture Reflex     Patient  presented recently with cough with chest x-ray showing no pneumonia.  She had nonspecific symptoms of malaise, intermittent diarrhea, intermittent chills, poor appetite.  No recent tick bite.  She had multiple lab abnormalities including elevated liver transaminases and elevated white count with significant leukocytes and hemoglobin on urinalysis.  Urine culture was not done.  Patient currently on doxycycline and has shown some improvement.  She is keeping down some electrolyte replacement.  She had recent sodium of 130 which may have reflected poor intake.  No diuretic use.  -Recheck labs tomorrow with urinalysis, CBC, CMP.  Urinalysis was ordered with reflex to culture if indicated. -Follow-up immediately for any fever or worsening or new symptoms -Doubt related to tick illness as she has not had any recent tick bites but will check Lyme antibodies and [facility] spotted fever antibodies.  She does seem be improving on doxycycline but not sure if this reflects recent tick related illness  No follow-ups on file.    Evelena Peat, MD

## 2023-04-19 ENCOUNTER — Encounter: Payer: Self-pay | Admitting: Obstetrics & Gynecology

## 2023-04-19 ENCOUNTER — Ambulatory Visit (INDEPENDENT_AMBULATORY_CARE_PROVIDER_SITE_OTHER): Payer: Medicare HMO | Admitting: Obstetrics & Gynecology

## 2023-04-19 ENCOUNTER — Ambulatory Visit (INDEPENDENT_AMBULATORY_CARE_PROVIDER_SITE_OTHER): Payer: Medicare HMO

## 2023-04-19 ENCOUNTER — Other Ambulatory Visit: Payer: Self-pay | Admitting: Obstetrics & Gynecology

## 2023-04-19 ENCOUNTER — Other Ambulatory Visit (INDEPENDENT_AMBULATORY_CARE_PROVIDER_SITE_OTHER): Payer: Medicare HMO

## 2023-04-19 VITALS — BP 134/74

## 2023-04-19 DIAGNOSIS — Z780 Asymptomatic menopausal state: Secondary | ICD-10-CM | POA: Diagnosis not present

## 2023-04-19 DIAGNOSIS — R7401 Elevation of levels of liver transaminase levels: Secondary | ICD-10-CM

## 2023-04-19 DIAGNOSIS — D72829 Elevated white blood cell count, unspecified: Secondary | ICD-10-CM | POA: Diagnosis not present

## 2023-04-19 DIAGNOSIS — R6883 Chills (without fever): Secondary | ICD-10-CM | POA: Diagnosis not present

## 2023-04-19 DIAGNOSIS — M810 Age-related osteoporosis without current pathological fracture: Secondary | ICD-10-CM

## 2023-04-19 DIAGNOSIS — Z13820 Encounter for screening for osteoporosis: Secondary | ICD-10-CM

## 2023-04-19 LAB — COMPREHENSIVE METABOLIC PANEL
ALT: 30 U/L (ref 0–35)
AST: 19 U/L (ref 0–37)
Albumin: 3.3 g/dL — ABNORMAL LOW (ref 3.5–5.2)
Alkaline Phosphatase: 477 U/L — ABNORMAL HIGH (ref 39–117)
BUN: 22 mg/dL (ref 6–23)
CO2: 24 mEq/L (ref 19–32)
Calcium: 9.9 mg/dL (ref 8.4–10.5)
Chloride: 102 mEq/L (ref 96–112)
Creatinine, Ser: 0.94 mg/dL (ref 0.40–1.20)
GFR: 56.58 mL/min — ABNORMAL LOW (ref >60.00)
Glucose, Bld: 117 mg/dL — ABNORMAL HIGH (ref 70–99)
Potassium: 4.2 mEq/L (ref 3.5–5.1)
Sodium: 136 mEq/L (ref 135–145)
Total Bilirubin: 1.1 mg/dL (ref 0.2–1.2)
Total Protein: 6.3 g/dL (ref 6.0–8.3)

## 2023-04-19 LAB — CBC WITH DIFFERENTIAL/PLATELET
Basophils Absolute: 0.1 10*3/uL (ref 0.0–0.1)
Basophils Relative: 0.4 % (ref 0.0–3.0)
Eosinophils Absolute: 0.2 10*3/uL (ref 0.0–0.7)
Eosinophils Relative: 0.9 % (ref 0.0–5.0)
HCT: 32.3 % — ABNORMAL LOW (ref 36.0–46.0)
Hemoglobin: 10.6 g/dL — ABNORMAL LOW (ref 12.0–15.0)
Lymphocytes Relative: 7.3 % — ABNORMAL LOW (ref 12.0–46.0)
Lymphs Abs: 1.2 10*3/uL (ref 0.7–4.0)
MCHC: 32.9 g/dL (ref 30.0–36.0)
MCV: 96.9 fl (ref 78.0–100.0)
Monocytes Absolute: 1.3 10*3/uL — ABNORMAL HIGH (ref 0.1–1.0)
Monocytes Relative: 7.8 % (ref 3.0–12.0)
Neutro Abs: 14 10*3/uL — ABNORMAL HIGH (ref 1.4–7.7)
Neutrophils Relative %: 83.6 % — ABNORMAL HIGH (ref 43.0–77.0)
Platelets: 397 10*3/uL (ref 150.0–400.0)
RBC: 3.33 Mil/uL — ABNORMAL LOW (ref 3.87–5.11)
RDW: 13.7 % (ref 11.5–15.5)
WBC: 16.8 10*3/uL — ABNORMAL HIGH (ref 4.0–10.5)

## 2023-04-19 NOTE — Progress Notes (Signed)
    Megan Reilly [birth year] [id number]        82 y.o.  [id number]   RP: Counseling and management on Bone Density 04/19/23  HPI: H/O Osteoporosis at the 1/3 Lt Forearm -2.6 in 04/2021.  Took Fosamax until 05/2021.  Lt knee fracture 09/2021, Rt foot fracture 09/2022.  Taking Ca++ and Vit D supplements.  Doing [employer] program and water aerobics.   OB History  Gravida Para Term Preterm AB Living  6 5     1 5   SAB IAB Ectopic Multiple Live Births  1            # Outcome Date GA Lbr Len/2nd Weight Sex Delivery Anes PTL Lv  6 SAB           5 Para           4 Para           3 Para           2 Para           1 Para             Past medical history,surgical history, problem list, medications, allergies, family history and social history were all reviewed and documented in the EPIC chart.   Directed ROS with pertinent positives and negatives documented in the history of present illness/assessment and plan.  Exam:  Vitals:   04/19/23 1057  BP: 134/74   General appearance:  Normal  Bone Density 04/19/2023:  Osteopenia at the 1/3 Lt Forearm with T-Score of -2.4.  Lt Femoral Neck T-Score -2.2.  Bone mineral Density stable c/w 04/2021.     Assessment/Plan:  82 y.o. [id number]   1. Age-related osteoporosis without current pathological fracture  H/O Osteoporosis at the 1/3 Lt Forearm -2.6 in 04/2021.  Took Fosamax until 05/2021.  Lt knee fracture 09/2021, Rt foot fracture 09/2022.  Taking Ca++ and Vit D supplements.  Doing [employer] program and water aerobics. Clinical Osteoporosis given H/O Osteoporosis on previous BD and H/O fragility fractures.  Current BD showing Osteopenia at the designated sites. Lowest sites 1/3 Lt Forearm with T-Score of -2.4 and Lt Femoral Neck T-Score -2.2.  Bone mineral Density stable at all sites c/w 04/2021.  Patient didn't like the side effects of Fosamax.  Given the stability of the BD, decision made to continue with Ca++ and Vit D supplements and  regular weight bearing physical activities.  Repeat BD in 2 years.  Patient voiced understanding and agreement.    Genia Del MD, 11:18 AM 04/19/2023

## 2023-04-22 ENCOUNTER — Telehealth: Payer: Self-pay | Admitting: Family Medicine

## 2023-04-22 ENCOUNTER — Ambulatory Visit: Payer: Medicare HMO | Admitting: Family Medicine

## 2023-04-22 NOTE — Telephone Encounter (Signed)
See result note.  

## 2023-04-22 NOTE — Telephone Encounter (Signed)
Pt is calling and has viewed her urine and bloodwork results on mychart and does not understand them and would like a callback

## 2023-04-23 LAB — URINE CULTURE
MICRO NUMBER:: 15179187
Result:: NO GROWTH
SPECIMEN QUALITY:: ADEQUATE

## 2023-04-23 LAB — URINALYSIS W MICROSCOPIC + REFLEX CULTURE
Bacteria, UA: NONE SEEN /HPF
Bilirubin Urine: NEGATIVE
Glucose, UA: NEGATIVE
Hgb urine dipstick: NEGATIVE
Ketones, ur: NEGATIVE
Nitrites, Initial: NEGATIVE
Protein, ur: NEGATIVE
RBC / HPF: NONE SEEN /HPF (ref 0–2)
Specific Gravity, Urine: 1.008 (ref 1.001–1.035)
pH: 6.5 (ref 5.0–8.0)

## 2023-04-23 LAB — B. BURGDORFI ANTIBODIES: B burgdorferi Ab IgG+IgM: <0.9 index

## 2023-04-23 LAB — ROCKY MTN SPOTTED FVR ABS PNL(IGG+IGM)
RMSF IgG: NOT DETECTED
RMSF IgM: NOT DETECTED

## 2023-04-23 LAB — CULTURE INDICATED

## 2023-04-25 ENCOUNTER — Other Ambulatory Visit: Payer: Medicare HMO

## 2023-04-25 ENCOUNTER — Telehealth: Payer: Self-pay | Admitting: Family Medicine

## 2023-04-25 DIAGNOSIS — D72829 Elevated white blood cell count, unspecified: Secondary | ICD-10-CM | POA: Diagnosis not present

## 2023-04-25 LAB — CBC WITH DIFFERENTIAL/PLATELET
Basophils Absolute: 0.1 10*3/uL (ref 0.0–0.1)
Basophils Relative: 0.8 % (ref 0.0–3.0)
Eosinophils Absolute: 0.2 10*3/uL (ref 0.0–0.7)
Eosinophils Relative: 2.3 % (ref 0.0–5.0)
HCT: 29.5 % — ABNORMAL LOW (ref 36.0–46.0)
Hemoglobin: 9.8 g/dL — ABNORMAL LOW (ref 12.0–15.0)
Lymphocytes Relative: 14.4 % (ref 12.0–46.0)
Lymphs Abs: 1.4 10*3/uL (ref 0.7–4.0)
MCHC: 33.2 g/dL (ref 30.0–36.0)
MCV: 96.8 fl (ref 78.0–100.0)
Monocytes Absolute: 1.2 10*3/uL — ABNORMAL HIGH (ref 0.1–1.0)
Monocytes Relative: 12 % (ref 3.0–12.0)
Neutro Abs: 6.8 10*3/uL (ref 1.4–7.7)
Neutrophils Relative %: 70.5 % (ref 43.0–77.0)
Platelets: 447 10*3/uL — ABNORMAL HIGH (ref 150.0–400.0)
RBC: 3.05 Mil/uL — ABNORMAL LOW (ref 3.87–5.11)
RDW: 12.8 % (ref 11.5–15.5)
WBC: 9.6 10*3/uL (ref 4.0–10.5)

## 2023-04-25 LAB — TSH: TSH: 1.7 u[IU]/mL (ref 0.35–5.50)

## 2023-04-25 LAB — SEDIMENTATION RATE: Sed Rate: 67 mm/hr — ABNORMAL HIGH (ref 0–30)

## 2023-04-25 NOTE — Telephone Encounter (Signed)
Patient reported she still is not feeling better and her night sweats are still ongoing. Patient inquired if there is any further tested that can be done? Patient also stated that some of her results on her lab work were abnormal such as WBC and Hemoglobin. Patient's daughter also inquired if patient needs to see another provider for additional testing?

## 2023-04-25 NOTE — Telephone Encounter (Signed)
Pt does not want to sch an appt yet she would like to start by getting a return call. Pt was seen on 04-19-2023 and pt is no better and per pt her blood work is not normal. Please advise

## 2023-04-25 NOTE — Telephone Encounter (Signed)
Labs placed and patient informed of the message below and voiced understanding.

## 2023-04-26 LAB — COMPREHENSIVE METABOLIC PANEL
ALT: 17 U/L (ref 0–35)
AST: 19 U/L (ref 0–37)
Albumin: 3.4 g/dL — ABNORMAL LOW (ref 3.5–5.2)
Alkaline Phosphatase: 256 U/L — ABNORMAL HIGH (ref 39–117)
BUN: 24 mg/dL — ABNORMAL HIGH (ref 6–23)
CO2: 26 mEq/L (ref 19–32)
Calcium: 10.7 mg/dL — ABNORMAL HIGH (ref 8.4–10.5)
Chloride: 102 mEq/L (ref 96–112)
Creatinine, Ser: 0.96 mg/dL (ref 0.40–1.20)
GFR: 55.17 mL/min — ABNORMAL LOW (ref >60.00)
Glucose, Bld: 104 mg/dL — ABNORMAL HIGH (ref 70–99)
Potassium: 4.5 mEq/L (ref 3.5–5.1)
Sodium: 137 mEq/L (ref 135–145)
Total Bilirubin: 0.5 mg/dL (ref 0.2–1.2)
Total Protein: 6.5 g/dL (ref 6.0–8.3)

## 2023-04-26 LAB — GAMMA GT: GGT: 250 U/L — ABNORMAL HIGH (ref 7–51)

## 2023-04-26 LAB — C-REACTIVE PROTEIN: CRP: 8.5 mg/dL (ref 0.5–20.0)

## 2023-04-26 LAB — CMV ABS, IGG+IGM (CYTOMEGALOVIRUS)
CMV IgM: <30 AU/mL
Cytomegalovirus Ab-IgG: >10 U/mL — ABNORMAL HIGH

## 2023-04-29 ENCOUNTER — Telehealth: Payer: Self-pay | Admitting: Family Medicine

## 2023-04-29 ENCOUNTER — Ambulatory Visit (INDEPENDENT_AMBULATORY_CARE_PROVIDER_SITE_OTHER): Payer: Medicare HMO | Admitting: Family Medicine

## 2023-04-29 DIAGNOSIS — R5383 Other fatigue: Secondary | ICD-10-CM

## 2023-04-29 DIAGNOSIS — R748 Abnormal levels of other serum enzymes: Secondary | ICD-10-CM | POA: Diagnosis not present

## 2023-04-29 DIAGNOSIS — R778 Other specified abnormalities of plasma proteins: Secondary | ICD-10-CM

## 2023-04-29 DIAGNOSIS — D649 Anemia, unspecified: Secondary | ICD-10-CM | POA: Diagnosis not present

## 2023-04-29 LAB — CBC WITH DIFFERENTIAL/PLATELET
Basophils Relative: 0.8 %
Eosinophils Relative: 2.1 %
MCHC: 32.5 g/dL (ref 32.0–36.0)
Neutrophils Relative %: 69.4 %
RBC: 3.29 10*6/uL — ABNORMAL LOW (ref 3.80–5.10)

## 2023-04-29 NOTE — Patient Instructions (Signed)
Follow up for any fever or any new symptoms.

## 2023-04-29 NOTE — Telephone Encounter (Signed)
Wants to let provider to know she ate steamed oysters at the beach around 03/26/23 and that she fell ill on 03/27/23. Has been sick since then.

## 2023-04-29 NOTE — Progress Notes (Unsigned)
Established Patient Office Visit  Subjective   Patient ID: Megan Reilly, female    DOB: 15-Oct-1940  Age: 82 y.o. MRN: [id number]  Chief Complaint  Patient presents with   Medical Management of Chronic Issues    HPI  {History (Optional):23778} Megan Reilly is seen today accompanied by her daughter with ongoing malaise.  Refer to multiple recent notes.  She initially presented with cough and symptoms progressed.  She had chest x-ray which showed no pneumonia.  She had lab work with elevated liver enzymes and was having some vague abdominal pains and CT abdomen pelvis showed no acute findings.  She continued to have night sweats and increased malaise.  Denied any tick bite history.  Urinalysis revealed no evidence for UTI.  COVID testing had been negative.  Tick serologies were negative.  Initial white count was quite elevated but has gradually improved.  CMV antibodies revealed positive IgG but negative IgM.  Her liver enzymes have been trending down.  She had elevated gamma GT.  Thyroid functions normal.  Most recent labs revealed corrected calcium 11.3 which is slightly up.  She also had hemoglobin which was trending down at 9.8 with normal MCV.  Overall she continues to feel poorly with general weakness fatigue but night sweats have basically resolved and no fever.  She still has some cough but overall improved.  No rash.  No arthralgias.  No abdominal pain.  No nausea or vomiting.  No diarrhea. He has maintained her weight and appetite is fair  She does recall being with family at the beach prior to all this and wonders if she may have ingested something that could have triggered some of this.  Past Medical History:  Diagnosis Date   Allergy    Arthritis    Blood transfusion abn reaction or complication, no procedure mishap    82 years old after tonsilectomy   Hypertension    Osteoporosis 04/2019   T score -2.1 improved from prior DEXA   Tibia fracture    left   Vasovagal syncope     2 prior incidences last was in 2015 ; treated in ED , reports no recurrence since then    Past Surgical History:  Procedure Laterality Date   COLONOSCOPY     FOOT SURGERY     bunectomy, hammertoe, bil   LAPAROSCOPIC BILATERAL SALPINGO OOPHERECTOMY     TONSILLECTOMY  10/12/1943   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   Procedure: RIGHT TOTAL KNEE ARTHROPLASTY;  Surgeon: Ollen Gross, MD;  Location: [facility];  Service: Orthopedics;  Laterality: Right;   TOTAL KNEE ARTHROPLASTY Left 08/09/2022   Procedure: TOTAL KNEE ARTHROPLASTY;  Surgeon: Ollen Gross, MD;  Location: [facility];  Service: Orthopedics;  Laterality: Left;    reports that she has never smoked. She has never used smokeless tobacco. She reports current alcohol use. She reports that she does not use drugs. family history includes Breast cancer in her maternal aunt; Diabetes in her father; Heart disease in her mother; Heart disease (age of onset: 88) in her father; Heart failure in her mother; Hyperlipidemia in her father and mother; Stroke in her mother. Allergies  Allergen Reactions   Benadryl [Diphenhydramine] Other (See Comments)    Light headed   Hctz [Hydrochlorothiazide] Other (See Comments)    Fatigue, body aches    Review of Systems  Constitutional:  Positive for malaise/fatigue. Negative for chills, fever and weight loss.  Respiratory:  Positive for cough. Negative for hemoptysis, sputum production, shortness of  breath and wheezing.   Cardiovascular:  Negative for chest pain.  Gastrointestinal:  Negative for abdominal pain, blood in stool, constipation, diarrhea, heartburn, nausea and vomiting.  Genitourinary:  Negative for dysuria and hematuria.  Neurological:  Negative for dizziness and headaches.      Objective:     BP 118/60 (BP Location: Left Arm, Patient Position: Sitting, Cuff Size: Normal)   Pulse 70   Temp 98.4 F (36.9 C) (Oral)   Ht 5\' 8"  (1.727 m)   Wt 157 lb (71.2 kg)   SpO2 98%   BMI  23.87 kg/m  BP Readings from Last 3 Encounters:  04/29/23 118/60  04/19/23 134/74  04/20/23 (!) 150/76   Wt Readings from Last 3 Encounters:  04/29/23 157 lb (71.2 kg)  04/11/23 162 lb 3.2 oz (73.6 kg)  04/05/23 158 lb 6.4 oz (71.8 kg)      Physical Exam Vitals reviewed.  Constitutional:      General: She is not in acute distress.    Appearance: She is not ill-appearing, toxic-appearing or diaphoretic.  Cardiovascular:     Rate and Rhythm: Normal rate and regular rhythm.  Pulmonary:     Effort: Pulmonary effort is normal.     Breath sounds: Normal breath sounds. No wheezing or rales.  Abdominal:     General: There is no distension.     Palpations: Abdomen is soft.     Tenderness: There is no abdominal tenderness.  Musculoskeletal:     Cervical back: Neck supple.     Right lower leg: No edema.     Left lower leg: No edema.  Lymphadenopathy:     Cervical: No cervical adenopathy.  Skin:    Findings: No rash.  Neurological:     Mental Status: She is alert.      No results found for any visits on 04/29/23.  Last CBC Lab Results  Component Value Date   WBC 9.6 04/25/2023   HGB 9.8 (L) 04/25/2023   HCT 29.5 (L) 04/25/2023   MCV 96.8 04/25/2023   MCH 32.5 08/10/2022   RDW 12.8 04/25/2023   PLT 447.0 (H) 04/25/2023   Last metabolic panel Lab Results  Component Value Date   GLUCOSE 104 (H) 04/25/2023   NA 137 04/25/2023   K 4.5 04/25/2023   CL 102 04/25/2023   CO2 26 04/25/2023   BUN 24 (H) 04/25/2023   CREATININE 0.96 04/25/2023   GFR 55.17 (L) 04/25/2023   CALCIUM 10.7 (H) 04/25/2023   PHOS 2.9 12/10/2013   PROT 6.5 04/25/2023   ALBUMIN 3.4 (L) 04/25/2023   BILITOT 0.5 04/25/2023   ALKPHOS 256 (H) 04/25/2023   AST 19 04/25/2023   ALT 17 04/25/2023   ANIONGAP 8 08/10/2022   Last hemoglobin A1c Lab Results  Component Value Date   HGBA1C 5.8 (H) 12/10/2013   Last thyroid functions Lab Results  Component Value Date   TSH 1.70 04/25/2023   Last  vitamin D Lab Results  Component Value Date   VD25OH 40 04/05/2017   Last vitamin B12 and Folate No results found for: "VITAMINB12", "FOLATE"    The ASCVD Risk score (Arnett DK, et al., 2019) failed to calculate for the following reasons:   The 2019 ASCVD risk score is only valid for ages 12 to 72    Assessment & Plan:   Problem List Items Addressed This Visit   None Visit Diagnoses     Hypercalcemia    -  Primary   Relevant Orders  PTH, Intact and Calcium   Electrophoresis, Protein and Immunofixation   Protein,Total and Electrophor w/IFE   Liver enzyme elevation       Relevant Orders   Epstein-Barr virus VCA antibody panel   Sedimentation rate   Fatigue, unspecified type       Anemia, unspecified type       Relevant Orders   CBC with Differential/Platelet   Reticulocytes   Haptoglobin   Lactate Dehydrogenase     Patient presented recently with symptoms of cough, malaise, night sweats.  She has had extensive workup as above with multiple abnormalities including elevated liver transaminases including alkaline phosphatase, normal CT abdomen pelvis, unremarkable chest x-ray, elevated corrected calcium, normocytic anemia  Recent labs have showed improvements in terms of normalization of white count and liver enzymes trending downward but she continues to feel poorly.  She is nontoxic in appearance and vital signs are very stable.  -We discussed checking further labs to rule out any evidence for hemolysis to explain her drop in hemoglobin with haptoglobin, lactate dehydrogenase, reticulocyte count, and repeat CBC  -Further evaluate elevated calcium with PTH and serum protein electrophoresis  -Did discuss other potential etiologies regarding her fatigue and elevated liver enzymes such as Epstein-Barr virus.  Will check antibodies  -Follow-up immediately for any fever or other concerns No follow-ups on file.    Evelena Peat, MD

## 2023-04-30 LAB — EPSTEIN-BARR VIRUS VCA ANTIBODY PANEL
EBV NA IgG: 337 U/mL — ABNORMAL HIGH
EBV VCA IgG: 391 U/mL — ABNORMAL HIGH

## 2023-04-30 LAB — CBC WITH DIFFERENTIAL/PLATELET
HCT: 31.7 % — ABNORMAL LOW (ref 35.0–45.0)
Hemoglobin: 10.3 g/dL — ABNORMAL LOW (ref 11.7–15.5)
Lymphs Abs: 1562 cells/uL (ref 850–3900)
MCH: 31.3 pg (ref 27.0–33.0)
MCV: 96.4 fL (ref 80.0–100.0)
Monocytes Relative: 10.9 %
Neutro Abs: 6454 cells/uL (ref 1500–7800)
Platelets: 460 10*3/uL — ABNORMAL HIGH (ref 140–400)

## 2023-04-30 LAB — PTH, INTACT AND CALCIUM
Calcium: 10.3 mg/dL (ref 8.6–10.4)
PTH: 50 pg/mL (ref 16–77)

## 2023-04-30 LAB — SEDIMENTATION RATE: Sed Rate: 75 mm/h — ABNORMAL HIGH (ref 0–30)

## 2023-05-02 LAB — CBC WITH DIFFERENTIAL/PLATELET
Absolute Monocytes: 1014 cells/uL — ABNORMAL HIGH (ref 200–950)
Basophils Absolute: 74 cells/uL (ref 0–200)
Eosinophils Absolute: 195 cells/uL (ref 15–500)
MPV: 10.5 fL (ref 7.5–12.5)
RDW: 11.3 % (ref 11.0–15.0)
Total Lymphocyte: 16.8 %
WBC: 9.3 10*3/uL (ref 3.8–10.8)

## 2023-05-02 LAB — RETICULOCYTES
ABS Retic: 42770 cells/uL (ref 20000–80000)
Retic Ct Pct: 1.3 %

## 2023-05-02 LAB — HAPTOGLOBIN: Haptoglobin: 366 mg/dL — ABNORMAL HIGH (ref 43–212)

## 2023-05-02 LAB — LACTATE DEHYDROGENASE: LDH: 126 U/L (ref 120–250)

## 2023-05-02 LAB — EPSTEIN-BARR VIRUS VCA ANTIBODY PANEL: EBV VCA IgM: <36 U/mL

## 2023-05-02 NOTE — Telephone Encounter (Signed)
Patient informed of the results and voiced understanding

## 2023-05-05 LAB — PROTEIN,TOTAL AND ELECTROPHOR W/IFE
Albumin ELP: 3.5 g/dL — ABNORMAL LOW (ref 3.8–4.8)
Alpha 1: 0.6 g/dL — ABNORMAL HIGH (ref 0.2–0.3)
Alpha 2: 1 g/dL — ABNORMAL HIGH (ref 0.5–0.9)
Beta 2: 0.4 g/dL (ref 0.2–0.5)
Beta Globulin: 0.4 g/dL (ref 0.4–0.6)
Gamma Globulin: 0.7 g/dL — ABNORMAL LOW (ref 0.8–1.7)

## 2023-05-05 NOTE — Addendum Note (Signed)
Addended by: Christy Sartorius on: 05/05/2023 02:22 PM   Modules accepted: Orders

## 2023-05-06 ENCOUNTER — Encounter: Payer: Self-pay | Admitting: Family Medicine

## 2023-05-06 ENCOUNTER — Ambulatory Visit: Payer: Medicare HMO | Admitting: Family Medicine

## 2023-05-06 VITALS — BP 134/70 | HR 75 | Temp 97.7°F | Ht 68.0 in | Wt 157.0 lb

## 2023-05-06 DIAGNOSIS — R300 Dysuria: Secondary | ICD-10-CM

## 2023-05-06 LAB — POC URINALSYSI DIPSTICK (AUTOMATED)
Bilirubin, UA: NEGATIVE
Blood, UA: POSITIVE
Glucose, UA: NEGATIVE
Ketones, UA: NEGATIVE
Nitrite, UA: NEGATIVE
Protein, UA: POSITIVE — AB
Spec Grav, UA: 1.01 (ref 1.010–1.025)
Urobilinogen, UA: 0.2 E.U./dL
pH, UA: 7.5 (ref 5.0–8.0)

## 2023-05-06 MED ORDER — CEPHALEXIN 500 MG PO CAPS: 500.0000 mg | ORAL_CAPSULE | Freq: Three times a day (TID) | ORAL | 0 refills | Status: DC

## 2023-05-06 NOTE — Progress Notes (Signed)
Established Patient Office Visit  Subjective   Patient ID: Megan Reilly, female    DOB: 1941/08/18  Age: 82 y.o. MRN: [id number]  Chief Complaint  Patient presents with   Dysuria    HPI   Megan Reilly is seen today as a work in with onset yesterday of some mild burning with urination and increased frequency.  No fever.  No chills.  No gross hematuria.  No flank pain.  Recent urine culture negative.  She has had frequent UTIs in the past though.  Overall, she is feeling better following recent prolonged illness.  She had presented initially with cough and increased malaise.  Chest x-ray showed no pneumonia.  She had extensive workup as previously outlined with elevated liver transaminases, mildly elevated calcium, elevated sed rate, normocytic anemia.  No clinical suspicion for PMR.  Recent SPEP showed poorly defined band of restricted protein mobility in the gammaglobulins of uncertain significance.  Her labs did not indicate any evidence for hemolysis to account for her recent anemia.  Her liver enzymes have been trending downward and she had no further fever.  Her white count had normalized.  She denies any recent arthralgias, skin rash, or any other new symptoms  Past Medical History:  Diagnosis Date   Allergy    Arthritis    Blood transfusion abn reaction or complication, no procedure mishap    82 years old after tonsilectomy   Hypertension    Osteoporosis 04/2019   T score -2.1 improved from prior DEXA   Tibia fracture    left   Vasovagal syncope    2 prior incidences last was in 2015 ; treated in ED , reports no recurrence since then    Past Surgical History:  Procedure Laterality Date   COLONOSCOPY     FOOT SURGERY     bunectomy, hammertoe, bil   LAPAROSCOPIC BILATERAL SALPINGO OOPHERECTOMY     TONSILLECTOMY  10/12/1943   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   Procedure: RIGHT TOTAL KNEE ARTHROPLASTY;  Surgeon: Ollen Gross, MD;  Location: [facility];  Service:  Orthopedics;  Laterality: Right;   TOTAL KNEE ARTHROPLASTY Left 08/09/2022   Procedure: TOTAL KNEE ARTHROPLASTY;  Surgeon: Ollen Gross, MD;  Location: [facility];  Service: Orthopedics;  Laterality: Left;    reports that she has never smoked. She has never used smokeless tobacco. She reports current alcohol use. She reports that she does not use drugs. family history includes Breast cancer in her maternal aunt; Diabetes in her father; Heart disease in her mother; Heart disease (age of onset: 55) in her father; Heart failure in her mother; Hyperlipidemia in her father and mother; Stroke in her mother. Allergies  Allergen Reactions   Benadryl [Diphenhydramine] Other (See Comments)    Light headed   Hctz [Hydrochlorothiazide] Other (See Comments)    Fatigue, body aches    Review of Systems  Constitutional:  Negative for chills and fever.  Gastrointestinal:  Negative for nausea and vomiting.  Genitourinary:  Positive for dysuria and frequency. Negative for flank pain and hematuria.      Objective:     BP 134/70 (BP Location: Left Arm, Patient Position: Sitting, Cuff Size: Normal)   Pulse 75   Temp 97.7 F (36.5 C) (Oral)   Ht 5\' 8"  (1.727 m)   Wt 157 lb (71.2 kg)   SpO2 100%   BMI 23.87 kg/m  BP Readings from Last 3 Encounters:  05/06/23 134/70  04/29/23 118/60  04/19/23 134/74   Wt  Readings from Last 3 Encounters:  05/06/23 157 lb (71.2 kg)  04/29/23 157 lb (71.2 kg)  04/11/23 162 lb 3.2 oz (73.6 kg)      Physical Exam Vitals reviewed.  Constitutional:      Appearance: Normal appearance.  Cardiovascular:     Rate and Rhythm: Normal rate and regular rhythm.  Pulmonary:     Effort: Pulmonary effort is normal.     Breath sounds: Normal breath sounds.  Neurological:     Mental Status: She is alert.      Results for orders placed or performed in visit on 05/06/23  POCT Urinalysis Dipstick (Automated)  Result Value Ref Range   Color, UA Yellow    Clarity, UA  Clear    Glucose, UA Negative Negative   Bilirubin, UA Negative    Ketones, UA Negative    Spec Grav, UA 1.010 1.010 - 1.025   Blood, UA Positive    pH, UA 7.5 5.0 - 8.0   Protein, UA Positive (A) Negative   Urobilinogen, UA 0.2 0.2 or 1.0 E.U./dL   Nitrite, UA Negative    Leukocytes, UA Large (3+) (A) Negative      The ASCVD Risk score (Arnett DK, et al., 2019) failed to calculate for the following reasons:   The 2019 ASCVD risk score is only valid for ages 51 to 87    Assessment & Plan:   Problem List Items Addressed This Visit   None Visit Diagnoses     Dysuria    -  Primary   Relevant Orders   POCT Urinalysis Dipstick (Automated) (Completed)   Urine Culture     Patient presents with 1 day history of mild dysuria.  Urine dipstick today shows 3+ leukocytes and positive blood.  Patient nontoxic in appearance -Urine culture sent -Start Keflex 500 mg 3 times daily for 7 days pending culture results -Stressed importance of adequate hydration -Follow-up for any persistent or worsening symptoms  No follow-ups on file.    Evelena Peat, MD

## 2023-05-09 MED ORDER — CIPROFLOXACIN HCL 500 MG PO TABS: 500.0000 mg | ORAL_TABLET | Freq: Two times a day (BID) | ORAL | 0 refills | Status: AC

## 2023-05-09 NOTE — Addendum Note (Signed)
Addended by: Christy Sartorius on: 05/09/2023 02:25 PM   Modules accepted: Orders

## 2023-05-18 DIAGNOSIS — D320 Benign neoplasm of cerebral meninges: Secondary | ICD-10-CM | POA: Diagnosis not present

## 2023-05-18 DIAGNOSIS — R220 Localized swelling, mass and lump, head: Secondary | ICD-10-CM | POA: Diagnosis not present

## 2023-05-18 DIAGNOSIS — D329 Benign neoplasm of meninges, unspecified: Secondary | ICD-10-CM | POA: Diagnosis not present

## 2023-05-25 DIAGNOSIS — D329 Benign neoplasm of meninges, unspecified: Secondary | ICD-10-CM | POA: Diagnosis not present

## 2023-05-27 ENCOUNTER — Other Ambulatory Visit: Payer: Self-pay | Admitting: Family Medicine

## 2023-05-27 DIAGNOSIS — Z1231 Encounter for screening mammogram for malignant neoplasm of breast: Secondary | ICD-10-CM

## 2023-05-31 DIAGNOSIS — M25512 Pain in left shoulder: Secondary | ICD-10-CM | POA: Diagnosis not present

## 2023-06-01 ENCOUNTER — Encounter: Payer: Self-pay | Admitting: Hematology

## 2023-06-01 ENCOUNTER — Inpatient Hospital Stay: Payer: Medicare HMO | Attending: Hematology | Admitting: Hematology

## 2023-06-01 ENCOUNTER — Inpatient Hospital Stay: Payer: Medicare HMO

## 2023-06-01 VITALS — BP 142/62 | HR 61 | Temp 98.4°F | Resp 18 | Wt 159.9 lb

## 2023-06-01 DIAGNOSIS — I10 Essential (primary) hypertension: Secondary | ICD-10-CM | POA: Diagnosis not present

## 2023-06-01 DIAGNOSIS — Z803 Family history of malignant neoplasm of breast: Secondary | ICD-10-CM | POA: Insufficient documentation

## 2023-06-01 DIAGNOSIS — D649 Anemia, unspecified: Secondary | ICD-10-CM | POA: Insufficient documentation

## 2023-06-01 DIAGNOSIS — R779 Abnormality of plasma protein, unspecified: Secondary | ICD-10-CM | POA: Insufficient documentation

## 2023-06-01 LAB — CBC WITH DIFFERENTIAL/PLATELET
Abs Immature Granulocytes: 0.02 10*3/uL (ref 0.00–0.07)
Basophils Absolute: 0.1 10*3/uL (ref 0.0–0.1)
Basophils Relative: 1 %
Eosinophils Absolute: 0.3 10*3/uL (ref 0.0–0.5)
Eosinophils Relative: 4 %
HCT: 35.9 % — ABNORMAL LOW (ref 36.0–46.0)
Hemoglobin: 12.1 g/dL (ref 12.0–15.0)
Immature Granulocytes: 0 %
Lymphocytes Relative: 26 %
Lymphs Abs: 2 10*3/uL (ref 0.7–4.0)
MCH: 31.8 pg (ref 26.0–34.0)
MCHC: 33.7 g/dL (ref 30.0–36.0)
MCV: 94.2 fL (ref 80.0–100.0)
Monocytes Absolute: 0.7 10*3/uL (ref 0.1–1.0)
Monocytes Relative: 8 %
Neutro Abs: 4.8 10*3/uL (ref 1.7–7.7)
Neutrophils Relative %: 61 %
Platelets: 215 10*3/uL (ref 150–400)
RBC: 3.81 MIL/uL — ABNORMAL LOW (ref 3.87–5.11)
RDW: 13 % (ref 11.5–15.5)
WBC: 7.8 10*3/uL (ref 4.0–10.5)
nRBC: 0 % (ref 0.0–0.2)

## 2023-06-01 LAB — RETIC PANEL
Immature Retic Fract: 6.4 % (ref 2.3–15.9)
RBC.: 3.89 MIL/uL (ref 3.87–5.11)
Retic Count, Absolute: 54.8 10*3/uL (ref 19.0–186.0)
Retic Ct Pct: 1.4 % (ref 0.4–3.1)
Reticulocyte Hemoglobin: 32.9 pg (ref >27.9)

## 2023-06-01 LAB — VITAMIN B12: Vitamin B-12: 881 pg/mL (ref 180–914)

## 2023-06-01 NOTE — Progress Notes (Signed)
[facility]   Telephone:(336) [phone] Fax:([phone]   Clinic New Consult Note   Patient Care Team: Kristian Covey, MD as PCP - General (Family Medicine) Janalyn Harder, MD (Inactive) as Consulting Physician (Dermatology)  Date of Service:  06/01/2023   CHIEF COMPLAINTS/PURPOSE OF CONSULTATION:  Abnormal SPEP   REFERRING PHYSICIAN:  Kristian Covey, MD  HISTORY OF PRESENTING ILLNESS:  Megan Reilly 82 y.o. female is a here because of Abnormal SPEP . The patient was referred by Kristian Covey, MD. The patient presents to the clinic today accompanied by daughter.   Pt state that since June 16 she had a dry cough, dehydrated and sever fatigue. She had nights sweats, dark urine. Her PCP put her on antibiotics for a UTI. Pt state that she had multiple IV Hydration.  Her appetite was also slightly low, but no weight loss.  No fever or chills.  Due to the persistent symptom, she had infection workup which were negative for [facility] spotted fever antibody, and B Burgdorfi antibody.  She was also found to have mild anemia with hemoglobin 10-11, SPEP was ordered which showed a poorly defined band of restricted protein mobility in, globulins, but immunofixation was negative for monoclonal protein, patient was referred to [place] for further evaluation.  Pt state in the last couple of weeks since taking antibiotics , she feels a lot of better. Pt state that she is back doing water aerobics. Pt state that she doesn't take walks anymore like she use to.  She has a PMHx of.... Hypertension Maternal aunt -breast cancer  Socially... Divorced Social drinker 5 children  REVIEW OF SYSTEMS:   Constitutional: (-)Denies fevers, (-) chills or (+)abnormal night sweats Eyes: (-)Denies blurriness of vision, double vision or watery eyes Ears, nose, mouth, throat, and face: (-)Denies mucositis or sore throat Respiratory:(+) cough, (-)dyspnea or wheezes Cardiovascular:(-)  Denies palpitation, chest discomfort or lower extremity swelling Gastrointestinal:  Denies nausea, heartburn or(-) change in bowel habits Skin: Denies abnormal skin rashes Lymphatics: (-)Denies new lymphadenopathy or easy bruising Neurological:(-) Denies numbness, tingling or new weaknesses Behavioral/Psych: (-)Mood is stable, no new changes  All other systems were reviewed with the patient and are negative.   MEDICAL HISTORY:  Past Medical History:  Diagnosis Date   Allergy    Arthritis    Blood transfusion abn reaction or complication, no procedure mishap    82 years old after tonsilectomy   Hypertension    Osteoporosis 04/2019   T score -2.1 improved from prior DEXA   Tibia fracture    left   Vasovagal syncope    2 prior incidences last was in 2015 ; treated in ED , reports no recurrence since then     SURGICAL HISTORY: Past Surgical History:  Procedure Laterality Date   COLONOSCOPY     FOOT SURGERY     bunectomy, hammertoe, bil   LAPAROSCOPIC BILATERAL SALPINGO OOPHERECTOMY     TONSILLECTOMY  10/12/1943   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   Procedure: RIGHT TOTAL KNEE ARTHROPLASTY;  Surgeon: Ollen Gross, MD;  Location: [facility];  Service: Orthopedics;  Laterality: Right;   TOTAL KNEE ARTHROPLASTY Left 08/09/2022   Procedure: TOTAL KNEE ARTHROPLASTY;  Surgeon: Ollen Gross, MD;  Location: [facility];  Service: Orthopedics;  Laterality: Left;    SOCIAL HISTORY: Social History   Socioeconomic History   Marital status: Divorced    Spouse name: Not on file   Number of children: 5   Years of  education: Not on file   Highest education level: Bachelor's degree (e.g., BA, AB, BS)  Occupational History   Not on file  Tobacco Use   Smoking status: Never   Smokeless tobacco: Never  Vaping Use   Vaping status: Never Used  Substance and Sexual Activity   Alcohol use: Yes    Comment: occ   Drug use: No   Sexual activity: Not Currently    Birth control/protection:  Surgical    Comment: BSO ,INTERCOURSE AGE 66, SEXUAL PARTNERS LESS THAN 5  Other Topics Concern   Not on file  Social History Narrative   Not on file   Social Determinants of Health   Financial Resource Strain: Low Risk  (04/28/2023)   Overall Financial Resource Strain (CARDIA)    Difficulty of Paying Living Expenses: Not very hard  Food Insecurity: No Food Insecurity (04/28/2023)   Hunger Vital Sign    Worried About Running Out of Food in the Last Year: Never true    Ran Out of Food in the Last Year: Never true  Transportation Needs: No Transportation Needs (04/28/2023)   PRAPARE - Administrator, Civil Service (Medical): No    Lack of Transportation (Non-Medical): No  Physical Activity: Sufficiently Active (07/13/2022)   Exercise Vital Sign    Days of Exercise per Week: 4 days    Minutes of Exercise per Session: 60 min  Stress: No Stress Concern Present (07/13/2022)   [employer] of Occupational Health - Occupational Stress Questionnaire    Feeling of Stress : Not at all  Social Connections: Moderately Integrated (04/28/2023)   Social Connection and Isolation Panel [NHANES]    Frequency of Communication with Friends and Family: More than three times a week    Frequency of Social Gatherings with Friends and Family: More than three times a week    Attends Religious Services: More than 4 times per year    Active Member of [employer] or Organizations: Yes    Attends Engineer, structural: More than 4 times per year    Marital Status: Divorced  Catering manager Violence: Not At Risk (08/09/2022)   Humiliation, Afraid, Rape, and Kick questionnaire    Fear of Current or Ex-Partner: No    Emotionally Abused: No    Physically Abused: No    Sexually Abused: No    FAMILY HISTORY: Family History  Problem Relation Age of Onset   Heart disease Mother    Hyperlipidemia Mother    Stroke Mother    Heart failure Mother    Diabetes Father        type ll   Heart  disease Father 49       sudden death age 29   Hyperlipidemia Father    Breast cancer Maternal Aunt     ALLERGIES:  is allergic to benadryl [diphenhydramine] and hctz [hydrochlorothiazide].  MEDICATIONS:  Current Outpatient Medications  Medication Sig Dispense Refill   amLODipine (NORVASC) 5 MG tablet TAKE 1 TABLET (5 MG TOTAL) BY MOUTH DAILY. 90 tablet 0   calcium carbonate (OS-CAL) 600 MG TABS Take 300 mg by mouth every other day.     cephALEXin (KEFLEX) 500 MG capsule Take 1 capsule (500 mg total) by mouth 3 (three) times daily. 21 capsule 0   cetirizine (ZYRTEC) 10 MG tablet Take 10 mg by mouth daily as needed for allergies.     Cholecalciferol (VITAMIN D-3 PO) Take 2,500 Units by mouth daily.      fish oil-omega-3 fatty  acids 1000 MG capsule Take 1 g by mouth daily.      fluticasone (FLONASE) 50 MCG/ACT nasal spray SPRAY 1 TO 2 SPARYS IN EACH NOSTRIL DAILY. 48 mL 2   latanoprost (XALATAN) 0.005 % ophthalmic solution 1 drop at bedtime.     losartan (COZAAR) 50 MG tablet Take 1 tablet (50 mg total) by mouth daily. 90 tablet 3   Magnesium 400 MG CAPS Take 1 tablet by mouth daily.      Melatonin 3 MG TABS Take 3 mg by mouth. 3-4 times a week     Multiple Vitamin (MULTIVITAMIN WITH MINERALS) TABS tablet Take 1 tablet by mouth every morning.     MYRBETRIQ 50 MG TB24 tablet TAKE 1 TABLET BY MOUTH EVERY DAY 30 tablet 5   No current facility-administered medications for this visit.    PHYSICAL EXAMINATION: ECOG PERFORMANCE STATUS: 1 - Symptomatic but completely ambulatory  Vitals:   06/01/23 1505  BP: (!) 142/62  Pulse: 61  Resp: 18  Temp: 98.4 F (36.9 C)  SpO2: 98%   Filed Weights   06/01/23 1505  Weight: 159 lb 14.4 oz (72.5 kg)     GENERAL:alert, no distress and comfortable SKIN: skin color normal, no rashes or significant lesions EYES: normal, Conjunctiva are pink and non-injected, sclera clear  NEURO: alert & oriented x 3 with fluent speech NECK:(-) supple, thyroid  normal size, non-tender, without nodularity LYMPH:  (-)no palpable lymphadenopathy in the cervical, axillary  LUNGS:(-) clear to auscultation and percussion with normal breathing effort HEART: (-)regular rate & rhythm and no murmurs and (-) no lower extremity edema ABDOMEN:(-)abdomen soft, (-)non-tender and (-)normal bowel sounds   LABORATORY DATA:  I have reviewed the data as listed    Latest Ref Rng & Units 06/01/2023    4:05 PM 04/29/2023   11:48 AM 04/25/2023    2:06 PM  CBC  WBC 4.0 - 10.5 K/uL 7.8  9.3  9.6   Hemoglobin 12.0 - 15.0 g/dL 30.1  60.1  9.8   Hematocrit 36.0 - 46.0 % 35.9  31.7  29.5   Platelets 150 - 400 K/uL 215  460  447.0        Latest Ref Rng & Units 04/29/2023   11:48 AM 04/25/2023    2:06 PM 04/19/2023    9:04 AM  CMP  Glucose 70 - 99 mg/dL  093  235   BUN 6 - 23 mg/dL  24  22   Creatinine 5.73 - 1.20 mg/dL  2.20  2.54   Sodium 270 - 145 mEq/L  137  136   Potassium 3.5 - 5.1 mEq/L  4.5  4.2   Chloride 96 - 112 mEq/L  102  102   CO2 19 - 32 mEq/L  26  24   Calcium 8.6 - 10.4 mg/dL 62.3  76.2  9.9   Total Protein 6.1 - 8.1 g/dL 6.7  6.5  6.3   Total Bilirubin 0.2 - 1.2 mg/dL  0.5  1.1   Alkaline Phos 39 - 117 U/L  256  477   AST 0 - 37 U/L  19  19   ALT 0 - 35 U/L  17  30      RADIOGRAPHIC STUDIES: I have personally reviewed the radiological images as listed and agreed with the findings in the report. No results found.  ASSESSMENT & PLAN:  Megan Reilly is a 82 y.o. female with a history of    1.Abnormal SPEP  -SPEP was  ordered during her workup for anemia, fatigue, and night sweats.  It showed a poorly defined band of restricted protein in the gammaglobulin, but immunofixation was negative for monoclonal protein.  This is likely nonspecific. -She however did have mild to moderate anemia, 1 episode of elevated serum calcium, Cr has been normal, I will repeat SPEP with immunofixation, immunoglobulin and light chain levels -If the above labs  were negative, no need bone marrow biopsy or follow-up on this  2.  Normocytic anemia -She developed mild to moderate anemia with hemoglobin 9.8-11 in the past 3 months -I will obtain reticular count, ferritin, B12, folate levels to rule out nutritional anemia -Her anemia is certainly possible related to her recent episodes of UTI and unclear source of infection -If she has persistent anemia in the next 2 to 3 months, I may obtain a bone marrow biopsy to rule out MDS.    PLAN:  -reviewing previous labs from PCP -lab today here in the  office -work up for anemia -repeat Multiple Myeloma panel -f/u phone visit in 2 weeks   Orders Placed This Encounter  Procedures   CBC with Differential/Platelet    Standing Status:   Standing    Number of Occurrences:   50    Standing Expiration Date:   05/31/2024   Erythropoietin    Standing Status:   Future    Number of Occurrences:   1    Standing Expiration Date:   05/31/2024   Ferritin    Standing Status:   Future    Number of Occurrences:   1    Standing Expiration Date:   05/31/2024   Folate RBC    Standing Status:   Future    Number of Occurrences:   1    Standing Expiration Date:   05/31/2024   Vitamin B12    Standing Status:   Standing    Number of Occurrences:   5    Standing Expiration Date:   05/31/2024   Multiple Myeloma Panel (SPEP&IFE w/QIG)    Standing Status:   Future    Number of Occurrences:   1    Standing Expiration Date:   05/31/2024   Kappa/lambda light chains    Standing Status:   Future    Number of Occurrences:   1    Standing Expiration Date:   05/31/2024   Retic Panel    Standing Status:   Future    Number of Occurrences:   1    Standing Expiration Date:   05/31/2024    All questions were answered. The patient knows to call the clinic with any problems, questions or concerns. The total time spent in the appointment was 30 minutes.     Malachy Mood, MD 06/01/2023 5:13 PM  I, Monica Martinez, am acting as  scribe for Malachy Mood, MD.   I have reviewed the above documentation for accuracy and completeness, and I agree with the above.

## 2023-06-02 LAB — KAPPA/LAMBDA LIGHT CHAINS
Kappa free light chain: 23.2 mg/L — ABNORMAL HIGH (ref 3.3–19.4)
Kappa, lambda light chain ratio: 1.33 (ref 0.26–1.65)
Lambda free light chains: 17.4 mg/L (ref 5.7–26.3)

## 2023-06-02 LAB — FOLATE RBC
Folate, Hemolysate: 596 ng/mL
Folate, RBC: 1646 ng/mL (ref >498)
Hematocrit: 36.2 % (ref 34.0–46.6)

## 2023-06-02 LAB — FERRITIN: Ferritin: 136 ng/mL (ref 11–307)

## 2023-06-02 LAB — ERYTHROPOIETIN: Erythropoietin: 7.5 m[IU]/mL (ref 2.6–18.5)

## 2023-06-03 ENCOUNTER — Telehealth: Payer: Self-pay | Admitting: Hematology

## 2023-06-06 LAB — MULTIPLE MYELOMA PANEL, SERUM
Albumin SerPl Elph-Mcnc: 3.8 g/dL (ref 2.9–4.4)
Albumin/Glob SerPl: 1.5 (ref 0.7–1.7)
Alpha 1: 0.3 g/dL (ref 0.0–0.4)
Alpha2 Glob SerPl Elph-Mcnc: 0.6 g/dL (ref 0.4–1.0)
B-Globulin SerPl Elph-Mcnc: 0.9 g/dL (ref 0.7–1.3)
Gamma Glob SerPl Elph-Mcnc: 0.8 g/dL (ref 0.4–1.8)
Globulin, Total: 2.6 g/dL (ref 2.2–3.9)
IgA: 232 mg/dL (ref 64–422)
IgG (Immunoglobin G), Serum: 733 mg/dL (ref 586–1602)
IgM (Immunoglobulin M), Srm: 115 mg/dL (ref 26–217)
Total Protein ELP: 6.4 g/dL (ref 6.0–8.5)

## 2023-06-10 ENCOUNTER — Ambulatory Visit
Admission: RE | Admit: 2023-06-10 | Discharge: 2023-06-10 | Disposition: A | Discharge status: Home / Self Care | Payer: Medicare HMO | Source: Ambulatory Visit | Attending: Family Medicine | Admitting: Family Medicine

## 2023-06-10 DIAGNOSIS — Z1231 Encounter for screening mammogram for malignant neoplasm of breast: Secondary | ICD-10-CM

## 2023-06-15 ENCOUNTER — Inpatient Hospital Stay: Payer: Medicare HMO | Attending: Hematology | Admitting: Hematology

## 2023-06-15 DIAGNOSIS — D649 Anemia, unspecified: Secondary | ICD-10-CM

## 2023-06-15 NOTE — Progress Notes (Signed)
[facility]   Telephone:(336) [phone] Fax:(336) [phone]   Clinic Follow up Note   Patient Care Team: Kristian Covey, MD as PCP - General (Family Medicine) Janalyn Harder, MD (Inactive) as Consulting Physician (Dermatology)  Date of Service:  06/15/2023  I connected with Megan Reilly on 06/15/2023 at  4:00 PM EDT by telephone visit and verified that I am speaking with the correct person using two identifiers.  I discussed the limitations, risks, security and privacy concerns of performing an evaluation and management service by telephone and the availability of in person appointments. I also discussed with the patient that there may be a patient responsible charge related to this service. The patient expressed understanding and agreed to proceed.   Other persons participating in the visit and their role in the encounter:  no  Patient's location:  Home Provider's location:  [facility] Office  CHIEF COMPLAINT: f/u of Abnormal SPEP   CURRENT THERAPY:    ASSESSMENT & PLAN:  Megan Reilly is a 82 y.o. female with    1.Abnormal SPEP  -SPEP was ordered during her workup for anemia, fatigue, and night sweats.  It showed a poorly defined band of restricted protein in the gammaglobulin, but immunofixation was negative for monoclonal protein.  This is likely nonspecific. -repeated SPEP on 06/01/2023 was negative for M protein, serum kappa light chain was slightly above normal limited, likely nonspecific. -No need to follow-up on this.  2.  Normocytic anemia -She developed mild to moderate anemia with hemoglobin 9.8-11 in the past 3 months -I will obtain reticular count, ferritin, B12, folate levels to rule out nutritional anemia -Her anemia is certainly possible related to her recent episodes of UTI and unclear source of infection -Repeated CBC on June 01, 2023 showed resolved anemia with hemoglobin 12.1.  Iron study, B12 and folate were normal.    PLAN: - Lab  reviewed- normal -no f/u with me needed   INTERVAL HISTORY:  Megan Reilly was contacted for a follow up of Abnormal SPEP . She was last seen by me on 06/01/2023. Pt state that she feels a lot better. She  states that she gets fatigue so easily. She reports that she will start  back to water aerobics   All other systems were reviewed with the patient and are negative.  MEDICAL HISTORY:  Past Medical History:  Diagnosis Date   Allergy    Arthritis    Blood transfusion abn reaction or complication, no procedure mishap    82 years old after tonsilectomy   Hypertension    Osteoporosis 04/2019   T score -2.1 improved from prior DEXA   Tibia fracture    left   Vasovagal syncope    2 prior incidences last was in 2015 ; treated in ED , reports no recurrence since then     SURGICAL HISTORY: Past Surgical History:  Procedure Laterality Date   COLONOSCOPY     FOOT SURGERY     bunectomy, hammertoe, bil   LAPAROSCOPIC BILATERAL SALPINGO OOPHERECTOMY     TONSILLECTOMY  10/12/1943   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   Procedure: RIGHT TOTAL KNEE ARTHROPLASTY;  Surgeon: Ollen Gross, MD;  Location: [facility];  Service: Orthopedics;  Laterality: Right;   TOTAL KNEE ARTHROPLASTY Left 08/09/2022   Procedure: TOTAL KNEE ARTHROPLASTY;  Surgeon: Ollen Gross, MD;  Location: [facility];  Service: Orthopedics;  Laterality: Left;    I have reviewed the social history and family history with the patient  and they are unchanged from previous note.  ALLERGIES:  is allergic to benadryl [diphenhydramine] and hctz [hydrochlorothiazide].  MEDICATIONS:  Current Outpatient Medications  Medication Sig Dispense Refill   amLODipine (NORVASC) 5 MG tablet TAKE 1 TABLET (5 MG TOTAL) BY MOUTH DAILY. 90 tablet 0   calcium carbonate (OS-CAL) 600 MG TABS Take 300 mg by mouth every other day.     cephALEXin (KEFLEX) 500 MG capsule Take 1 capsule (500 mg total) by mouth 3 (three) times daily. 21 capsule 0    cetirizine (ZYRTEC) 10 MG tablet Take 10 mg by mouth daily as needed for allergies.     Cholecalciferol (VITAMIN D-3 PO) Take 2,500 Units by mouth daily.      fish oil-omega-3 fatty acids 1000 MG capsule Take 1 g by mouth daily.      fluticasone (FLONASE) 50 MCG/ACT nasal spray SPRAY 1 TO 2 SPARYS IN EACH NOSTRIL DAILY. 48 mL 2   latanoprost (XALATAN) 0.005 % ophthalmic solution 1 drop at bedtime.     losartan (COZAAR) 50 MG tablet Take 1 tablet (50 mg total) by mouth daily. 90 tablet 3   Magnesium 400 MG CAPS Take 1 tablet by mouth daily.      Melatonin 3 MG TABS Take 3 mg by mouth. 3-4 times a week     Multiple Vitamin (MULTIVITAMIN WITH MINERALS) TABS tablet Take 1 tablet by mouth every morning.     MYRBETRIQ 50 MG TB24 tablet TAKE 1 TABLET BY MOUTH EVERY DAY 30 tablet 5   No current facility-administered medications for this visit.    PHYSICAL EXAMINATION: ECOG PERFORMANCE STATUS: 1 - Symptomatic but completely ambulatory  There were no vitals filed for this visit. Wt Readings from Last 3 Encounters:  06/01/23 159 lb 14.4 oz (72.5 kg)  05/06/23 157 lb (71.2 kg)  04/29/23 157 lb (71.2 kg)     No vitals taken today, Exam not performed today  LABORATORY DATA:  I have reviewed the data as listed    Latest Ref Rng & Units 06/01/2023    4:05 PM 04/29/2023   11:48 AM 04/25/2023    2:06 PM  CBC  WBC 4.0 - 10.5 K/uL 7.8  9.3  9.6   Hemoglobin 12.0 - 15.0 g/dL 40.9  81.1  9.8   Hematocrit 34.0 - 46.6 % 36.0 - 46.0 % 36.2    35.9  31.7  29.5   Platelets 150 - 400 K/uL 215  460  447.0         Latest Ref Rng & Units 04/29/2023   11:48 AM 04/25/2023    2:06 PM 04/19/2023    9:04 AM  CMP  Glucose 70 - 99 mg/dL  914  782   BUN 6 - 23 mg/dL  24  22   Creatinine 9.56 - 1.20 mg/dL  2.13  0.86   Sodium 578 - 145 mEq/L  137  136   Potassium 3.5 - 5.1 mEq/L  4.5  4.2   Chloride 96 - 112 mEq/L  102  102   CO2 19 - 32 mEq/L  26  24   Calcium 8.6 - 10.4 mg/dL 46.9  62.9  9.9   Total  Protein 6.1 - 8.1 g/dL 6.7  6.5  6.3   Total Bilirubin 0.2 - 1.2 mg/dL  0.5  1.1   Alkaline Phos 39 - 117 U/L  256  477   AST 0 - 37 U/L  19  19   ALT 0 - 35 U/L  17  30       RADIOGRAPHIC STUDIES: I have personally reviewed the radiological images as listed and agreed with the findings in the report. No results found.    No orders of the defined types were placed in this encounter.  All questions were answered. The patient knows to call the clinic with any problems, questions or concerns. No barriers to learning was detected. The total time spent in the appointment was 11 minutes.     Malachy Mood, MD 06/15/2023   Carolin Coy am acting as scribe for Malachy Mood, MD.   I have reviewed the above documentation for accuracy and completeness, and I agree with the above.

## 2023-06-19 ENCOUNTER — Encounter: Payer: Self-pay | Admitting: Hematology

## 2023-06-19 DIAGNOSIS — D649 Anemia, unspecified: Secondary | ICD-10-CM | POA: Insufficient documentation

## 2023-07-07 ENCOUNTER — Other Ambulatory Visit: Payer: Self-pay | Admitting: Family Medicine

## 2023-07-12 DIAGNOSIS — L821 Other seborrheic keratosis: Secondary | ICD-10-CM | POA: Diagnosis not present

## 2023-07-12 DIAGNOSIS — L820 Inflamed seborrheic keratosis: Secondary | ICD-10-CM | POA: Diagnosis not present

## 2023-07-18 ENCOUNTER — Ambulatory Visit (INDEPENDENT_AMBULATORY_CARE_PROVIDER_SITE_OTHER): Payer: Medicare HMO

## 2023-07-18 VITALS — Ht 68.0 in | Wt 159.0 lb

## 2023-07-18 DIAGNOSIS — Z0000 Encounter for general adult medical examination without abnormal findings: Secondary | ICD-10-CM | POA: Diagnosis not present

## 2023-07-18 NOTE — Progress Notes (Signed)
Subjective:   Megan Reilly is a 82 y.o. female who presents for Medicare Annual (Subsequent) preventive examination.  Visit Complete: Virtual  I connected with  Jenelle Mages on 07/18/23 by a audio enabled telemedicine application and verified that I am speaking with the correct person using two identifiers.  Patient Location: Home  Provider Location: Home Office  I discussed the limitations of evaluation and management by telemedicine. The patient expressed understanding and agreed to proceed.  Patient Medicare AWV questionnaire was completed by the patient on 07/17/23; I have confirmed that all information answered by patient is correct and no changes since this date.  Cardiac Risk Factors include: advanced age (>38men, >64 women);hypertensionBecause this visit was a virtual/telehealth visit, some criteria may be missing or patient reported. Any vitals not documented were not able to be obtained and vitals that have been documented are patient reported.       Objective:    Today's Vitals   07/18/23 1338  Weight: 159 lb (72.1 kg)  Height: 5\' 8"  (1.727 m)   Body mass index is 24.18 kg/m.     07/18/2023    1:49 PM 08/09/2022   10:57 AM 07/13/2022   11:33 AM 09/27/2021    1:29 PM 06/30/2021    1:10 PM 06/26/2020    9:50 AM 03/27/2018    7:24 PM  Advanced Directives  Does Patient Have a Medical Advance Directive? Yes No Yes Yes Yes Yes Yes  Type of Estate agent of [place];Living will  Healthcare Power of [place];Living will Healthcare Power of [place];Living will Healthcare Power of [place];Living will Healthcare Power of [place];Living will Living will  Does patient want to make changes to medical advance directive? No - Patient declined  No - Patient declined   No - Patient declined No - Patient declined  Copy of Healthcare Power of Attorney in Chart? Yes - validated most recent copy scanned in chart (See row information)  Yes - validated  most recent copy scanned in chart (See row information)  No - copy requested Yes - validated most recent copy scanned in chart (See row information)   Would patient like information on creating a medical advance directive?  No - Patient declined         Current Medications (verified) Outpatient Encounter Medications as of 07/18/2023  Medication Sig   amLODipine (NORVASC) 5 MG tablet TAKE 1 TABLET (5 MG TOTAL) BY MOUTH DAILY.   calcium carbonate (OS-CAL) 600 MG TABS Take 300 mg by mouth every other day.   cephALEXin (KEFLEX) 500 MG capsule Take 1 capsule (500 mg total) by mouth 3 (three) times daily.   cetirizine (ZYRTEC) 10 MG tablet Take 10 mg by mouth daily as needed for allergies.   Cholecalciferol (VITAMIN D-3 PO) Take 2,500 Units by mouth daily.    fish oil-omega-3 fatty acids 1000 MG capsule Take 1 g by mouth daily.    fluticasone (FLONASE) 50 MCG/ACT nasal spray SPRAY 1 TO 2 SPARYS IN EACH NOSTRIL DAILY.   latanoprost (XALATAN) 0.005 % ophthalmic solution 1 drop at bedtime.   losartan (COZAAR) 50 MG tablet Take 1 tablet (50 mg total) by mouth daily.   Magnesium 400 MG CAPS Take 1 tablet by mouth daily.    Melatonin 3 MG TABS Take 3 mg by mouth. 3-4 times a week   Multiple Vitamin (MULTIVITAMIN WITH MINERALS) TABS tablet Take 1 tablet by mouth every morning.   MYRBETRIQ 50 MG TB24 tablet TAKE 1 TABLET BY  MOUTH EVERY DAY   No facility-administered encounter medications on file as of 07/18/2023.    Allergies (verified) Benadryl [diphenhydramine] and Hctz [hydrochlorothiazide]   History: Past Medical History:  Diagnosis Date   Allergy    Arthritis    Blood transfusion abn reaction or complication, no procedure mishap    82 years old after tonsilectomy   Hypertension    Osteoporosis 04/2019   T score -2.1 improved from prior DEXA   Tibia fracture    left   Vasovagal syncope    2 prior incidences last was in 2015 ; treated in ED , reports no recurrence since then    Past  Surgical History:  Procedure Laterality Date   COLONOSCOPY     FOOT SURGERY     bunectomy, hammertoe, bil   LAPAROSCOPIC BILATERAL SALPINGO OOPHERECTOMY     TONSILLECTOMY  10/12/1943   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   Procedure: RIGHT TOTAL KNEE ARTHROPLASTY;  Surgeon: Ollen Gross, MD;  Location: [facility];  Service: Orthopedics;  Laterality: Right;   TOTAL KNEE ARTHROPLASTY Left 08/09/2022   Procedure: TOTAL KNEE ARTHROPLASTY;  Surgeon: Ollen Gross, MD;  Location: [facility];  Service: Orthopedics;  Laterality: Left;   Family History  Problem Relation Age of Onset   Heart disease Mother    Hyperlipidemia Mother    Stroke Mother    Heart failure Mother    Diabetes Father        type ll   Heart disease Father 19       sudden death age 85   Hyperlipidemia Father    Breast cancer Maternal Aunt    Social History   Socioeconomic History   Marital status: Divorced    Spouse name: Not on file   Number of children: 5   Years of education: Not on file   Highest education level: Bachelor's degree (e.g., BA, AB, BS)  Occupational History   Not on file  Tobacco Use   Smoking status: Never   Smokeless tobacco: Never  Vaping Use   Vaping status: Never Used  Substance and Sexual Activity   Alcohol use: Yes    Comment: occ   Drug use: No   Sexual activity: Not Currently    Birth control/protection: Surgical    Comment: BSO ,INTERCOURSE AGE 81, SEXUAL PARTNERS LESS THAN 5  Other Topics Concern   Not on file  Social History Narrative   Not on file   Social Determinants of Health   Financial Resource Strain: Low Risk  (07/17/2023)   Overall Financial Resource Strain (CARDIA)    Difficulty of Paying Living Expenses: Not hard at all  Food Insecurity: No Food Insecurity (07/17/2023)   Hunger Vital Sign    Worried About Running Out of Food in the Last Year: Never true    Ran Out of Food in the Last Year: Never true  Transportation Needs: No Transportation Needs  (07/17/2023)   PRAPARE - Administrator, Civil Service (Medical): No    Lack of Transportation (Non-Medical): No  Physical Activity: Sufficiently Active (07/17/2023)   Exercise Vital Sign    Days of Exercise per Week: 4 days    Minutes of Exercise per Session: 40 min  Stress: Stress Concern Present (07/17/2023)   [employer] of Occupational Health - Occupational Stress Questionnaire    Feeling of Stress : To some extent  Social Connections: Unknown (07/17/2023)   Social Connection and Isolation Panel [NHANES]    Frequency of Communication with Friends  and Family: More than three times a week    Frequency of Social Gatherings with Friends and Family: Twice a week    Attends Religious Services: Not on Marketing executive or Organizations: Yes    Attends Engineer, structural: More than 4 times per year    Marital Status: Divorced    Tobacco Counseling Counseling given: Not Answered   Clinical Intake:  Pre-visit preparation completed: Yes  Pain : No/denies pain     BMI - recorded: 24.18 Nutritional Status: BMI of 19-24  Normal Nutritional Risks: None Diabetes: No  How often do you need to have someone help you when you read instructions, pamphlets, or other written materials from your doctor or pharmacy?: 1 - Never  Interpreter Needed?: No      Activities of Daily Living    07/18/2023    1:46 PM 07/17/2023    2:43 PM  In your present state of health, do you have any difficulty performing the following activities:  Hearing? 1 1  Comment Wears hearing aids   Vision? 0 0  Difficulty concentrating or making decisions? 0 0  Walking or climbing stairs? 1 1  Comment Uses a Cane   Dressing or bathing? 0 0  Doing errands, shopping? 0 0  Preparing Food and eating ? N N  Using the Toilet? N N  In the past six months, have you accidently leaked urine? Y Y  Comment Followed by PCP   Do you have problems with loss of bowel control? N N   Managing your Medications? N N  Managing your Finances? N N  Housekeeping or managing your Housekeeping? N N    Patient Care Team: Kristian Covey, MD as PCP - General (Family Medicine) Janalyn Harder, MD (Inactive) as Consulting Physician (Dermatology)  Indicate any recent Medical Services you may have received from other than [facility] providers in the past year (date may be approximate).     Assessment:   This is a routine wellness examination for [place].  Hearing/Vision screen Hearing Screening - Comments:: Wears hearing aids Vision Screening - Comments:: Wears rx glasses - up to date with routine eye exams with  Dr Burgess Estelle   Goals Addressed               This Visit's Progress     Get stronger (pt-stated)         Depression Screen    07/18/2023    1:43 PM 04/11/2023    1:03 PM 03/28/2023    4:11 PM 07/21/2022    8:16 AM 07/13/2022   11:28 AM 12/29/2021    3:56 PM 06/30/2021    1:12 PM  PHQ 2/9 Scores  PHQ - 2 Score 0 1  0 0 1 0  PHQ- 9 Score  8    5   Exception Documentation   Patient refusal        Fall Risk    07/18/2023    1:58 PM 07/17/2023    2:43 PM 04/28/2023    1:04 PM 04/11/2023    1:02 PM 03/28/2023    4:11 PM  Fall Risk   Falls in the past year? 0 0 0 0 0  Number falls in past yr: 0 0  0 0  Injury with Fall? 0 0  0 0  Risk for fall due to : No Fall Risks No Fall Risks  Impaired balance/gait   Follow up Falls prevention discussed Falls prevention discussed  Falls evaluation completed Falls evaluation completed    MEDICARE RISK AT HOME: Medicare Risk at Home Any stairs in or around the home?: No If so, are there any without handrails?: No Home free of loose throw rugs in walkways, pet beds, electrical cords, etc?: Yes Adequate lighting in your home to reduce risk of falls?: Yes Life alert?: No Use of a cane, walker or w/c?: No Grab bars in the bathroom?: Yes Shower chair or bench in shower?: Yes Elevated toilet seat or a handicapped toilet?:  Yes  TIMED UP AND GO:  Was the test performed?  No    Cognitive Function:        07/18/2023    1:49 PM 07/13/2022   11:33 AM  6CIT Screen  What Year? 0 points 0 points  What month? 0 points 0 points  What time? 0 points 0 points  Count back from 20 0 points 0 points  Months in reverse 0 points 0 points  Repeat phrase 0 points 0 points  Total Score 0 points 0 points    Immunizations Immunization History  Administered Date(s) Administered   Fluad Quad(high Dose 65+) 07/17/2019, 06/24/2020, 07/01/2022   Influenza Split 07/22/2011, 08/09/2012   Influenza, High Dose Seasonal PF 07/24/2015, 07/15/2016, 07/04/2017, 07/03/2018, 07/03/2019, 07/09/2021, 06/28/2023   Influenza,inj,Quad PF,6+ Mos 06/26/2013, 07/15/2014   Influenza-Unspecified 07/03/2018   Moderna Covid-19 Fall Seasonal Vaccine 75yrs & older 06/28/2023   PFIZER Comirnaty(Gray Top)Covid-19 Tri-Sucrose Vaccine 01/16/2021, 07/21/2022   PFIZER(Purple Top)SARS-COV-2 Vaccination 10/23/2019, 11/12/2019, 06/29/2020, 01/16/2021, 07/09/2021   PPD Test 01/19/2013   Pfizer Covid-19 Vaccine Bivalent Booster 83yrs & up 07/10/2021, 07/21/2022   Pneumococcal Conjugate-13 10/12/2007, 09/18/2013   Pneumococcal Polysaccharide-23 09/29/2016   Respiratory Syncytial Virus Vaccine,Recomb Aduvanted(Arexvy) 10/14/2022   Tdap 02/02/2011, 07/01/2022   Zoster Recombinant(Shingrix) 12/19/2017, 05/31/2018   Zoster, Live 07/11/2012    TDAP status: Up to date  Flu Vaccine status: Up to date  Pneumococcal vaccine status: Up to date  Covid-19 vaccine status: Completed vaccines  Qualifies for Shingles Vaccine? Yes   Zostavax completed Yes   Shingrix Completed?: Yes  Screening Tests Health Maintenance  Topic Date Due   COVID-19 Vaccine (9 - 2023-24 season) 08/23/2023   Medicare Annual Wellness (AWV)  07/17/2024   DTaP/Tdap/Td (3 - Td or Tdap) 07/01/2032   Pneumonia Vaccine 11+ Years old  Completed   INFLUENZA VACCINE  Completed   DEXA  SCAN  Completed   Zoster Vaccines- Shingrix  Completed   HPV VACCINES  Aged Out   Hepatitis C Screening  Discontinued    Health Maintenance  There are no preventive care reminders to display for this patient.    Bone Density status: Completed 04/19/23. Results reflect: Bone density results: OSTEOPOROSIS. Repeat every   years.   Additional Screening:   Vision Screening: Recommended annual ophthalmology exams for early detection of glaucoma and other disorders of the eye. Is the patient up to date with their annual eye exam?  Yes  Who is the provider or what is the name of the office in which the patient attends annual eye exams? Dr Burgess Estelle If pt is not established with a provider, would they like to be referred to a provider to establish care? No .   Dental Screening: Recommended annual dental exams for proper oral hygiene    Community Resource Referral / Chronic Care Management:  CRR required this visit?  No   CCM required this visit?  No     Plan:     I have  personally reviewed and noted the following in the patient's chart:   Medical and social history Use of alcohol, tobacco or illicit drugs  Current medications and supplements including opioid prescriptions. Patient is not currently taking opioid prescriptions. Functional ability and status Nutritional status Physical activity Advanced directives List of other physicians Hospitalizations, surgeries, and ER visits in previous 12 months Vitals Screenings to include cognitive, depression, and falls Referrals and appointments  In addition, I have reviewed and discussed with patient certain preventive protocols, quality metrics, and best practice recommendations. A written personalized care plan for preventive services as well as general preventive health recommendations were provided to patient.     Tillie Rung, LPN   [id number]   After Visit Summary: (MyChart) Due to this being a telephonic visit, the  after visit summary with patients personalized plan was offered to patient via MyChart   Nurse Notes: None

## 2023-07-18 NOTE — Patient Instructions (Addendum)
Megan Reilly , Thank you for taking time to come for your Medicare Wellness Visit. I appreciate your ongoing commitment to your health goals. Please review the following plan we discussed and let me know if I can assist you in the future.   Referrals/Orders/Follow-Ups/Clinician Recommendations:   This is a list of the screening recommended for you and due dates:  Health Maintenance  Topic Date Due   COVID-19 Vaccine (9 - 2023-24 season) 08/23/2023   Medicare Annual Wellness Visit  07/17/2024   DTaP/Tdap/Td vaccine (3 - Td or Tdap) 07/01/2032   Pneumonia Vaccine  Completed   Flu Shot  Completed   DEXA scan (bone density measurement)  Completed   Zoster (Shingles) Vaccine  Completed   HPV Vaccine  Aged Out   Hepatitis C Screening  Discontinued    Advanced directives: (In Chart) A copy of your advanced directives are scanned into your chart should your provider ever need it.  Next Medicare Annual Wellness Visit scheduled for next year: Yes

## 2023-08-04 DIAGNOSIS — Z96652 Presence of left artificial knee joint: Secondary | ICD-10-CM | POA: Diagnosis not present

## 2023-08-09 ENCOUNTER — Other Ambulatory Visit: Payer: Self-pay | Admitting: Family Medicine

## 2023-08-10 DIAGNOSIS — M25812 Other specified joint disorders, left shoulder: Secondary | ICD-10-CM | POA: Diagnosis not present

## 2023-08-11 DIAGNOSIS — M12812 Other specific arthropathies, not elsewhere classified, left shoulder: Secondary | ICD-10-CM | POA: Insufficient documentation

## 2023-08-23 ENCOUNTER — Ambulatory Visit (INDEPENDENT_AMBULATORY_CARE_PROVIDER_SITE_OTHER): Payer: Medicare HMO | Admitting: Family Medicine

## 2023-08-23 ENCOUNTER — Encounter: Payer: Self-pay | Admitting: Family Medicine

## 2023-08-23 VITALS — BP 124/78 | HR 69 | Temp 98.6°F | Ht 68.0 in | Wt 153.6 lb

## 2023-08-23 DIAGNOSIS — I10 Essential (primary) hypertension: Secondary | ICD-10-CM

## 2023-08-23 DIAGNOSIS — E785 Hyperlipidemia, unspecified: Secondary | ICD-10-CM | POA: Diagnosis not present

## 2023-08-23 DIAGNOSIS — D649 Anemia, unspecified: Secondary | ICD-10-CM | POA: Diagnosis not present

## 2023-08-23 DIAGNOSIS — Z0000 Encounter for general adult medical examination without abnormal findings: Secondary | ICD-10-CM

## 2023-08-23 LAB — CBC WITH DIFFERENTIAL/PLATELET
Basophils Absolute: 0 10*3/uL (ref 0.0–0.1)
Basophils Relative: 0.5 % (ref 0.0–3.0)
Eosinophils Absolute: 0.2 10*3/uL (ref 0.0–0.7)
Eosinophils Relative: 2.1 % (ref 0.0–5.0)
HCT: 41.3 % (ref 36.0–46.0)
Hemoglobin: 13.9 g/dL (ref 12.0–15.0)
Lymphocytes Relative: 18.7 % (ref 12.0–46.0)
Lymphs Abs: 1.7 10*3/uL (ref 0.7–4.0)
MCHC: 33.6 g/dL (ref 30.0–36.0)
MCV: 95.9 fL (ref 78.0–100.0)
Monocytes Absolute: 0.6 10*3/uL (ref 0.1–1.0)
Monocytes Relative: 6.5 % (ref 3.0–12.0)
Neutro Abs: 6.4 10*3/uL (ref 1.4–7.7)
Neutrophils Relative %: 72.2 % (ref 43.0–77.0)
Platelets: 222 10*3/uL (ref 150.0–400.0)
RBC: 4.3 Mil/uL (ref 3.87–5.11)
RDW: 13.5 % (ref 11.5–15.5)
WBC: 8.8 10*3/uL (ref 4.0–10.5)

## 2023-08-23 LAB — COMPREHENSIVE METABOLIC PANEL
ALT: 13 U/L (ref 0–35)
AST: 23 U/L (ref 0–37)
Albumin: 4.4 g/dL (ref 3.5–5.2)
Alkaline Phosphatase: 67 U/L (ref 39–117)
BUN: 18 mg/dL (ref 6–23)
CO2: 26 meq/L (ref 19–32)
Calcium: 10.7 mg/dL — ABNORMAL HIGH (ref 8.4–10.5)
Chloride: 105 meq/L (ref 96–112)
Creatinine, Ser: 0.88 mg/dL (ref 0.40–1.20)
GFR: 61.1 mL/min (ref >60.00)
Glucose, Bld: 111 mg/dL — ABNORMAL HIGH (ref 70–99)
Potassium: 4.3 meq/L (ref 3.5–5.1)
Sodium: 139 meq/L (ref 135–145)
Total Bilirubin: 0.7 mg/dL (ref 0.2–1.2)
Total Protein: 6.9 g/dL (ref 6.0–8.3)

## 2023-08-23 LAB — LIPID PANEL
Cholesterol: 202 mg/dL — ABNORMAL HIGH (ref 0–200)
HDL: 60 mg/dL (ref >39.00)
LDL Cholesterol: 113 mg/dL — ABNORMAL HIGH (ref 0–99)
NonHDL: 141.66
Total CHOL/HDL Ratio: 3
Triglycerides: 144 mg/dL (ref 0.0–149.0)
VLDL: 28.8 mg/dL (ref 0.0–40.0)

## 2023-08-23 NOTE — Progress Notes (Signed)
Established Patient Office Visit  Subjective   Patient ID: Megan Reilly, female    DOB: 1940-10-18  Age: 82 y.o. MRN: [id number]  Chief Complaint  Patient presents with   Annual Exam    HPI   Megan Reilly is seen today for physical exam.  She has severe primary osteoarthritis left shoulder and is looking a joint replacement.  Surgery has not been scheduled yet.  They are hoping to avoid general anesthesia.  She is very limited functionally with her left shoulder at this time.  She had prolonged illness this summer which was presumed viral.  She had multiple abnormalities including elevated liver transaminases, persistent cough, malaise, night sweats, hypercalcemia, anemia.  Had extensive workup which was mostly unrevealing.  She eventually was referred to hematologist with no clear etiology found.  Fortunately, no evidence for myeloma.  She did recover and feels back close to baseline this time.  Has some persistent fatigue but no fever or night sweats.  Appetite stable.  No cardiovascular or cerebrovascular history.  No history of diabetes.  Health maintenance reviewed:  Health Maintenance  Topic Date Due   COVID-19 Vaccine (9 - 2023-24 season) 08/23/2023   Medicare Annual Wellness (AWV)  07/17/2024   DTaP/Tdap/Td (3 - Td or Tdap) 07/01/2032   Pneumonia Vaccine 37+ Years old  Completed   INFLUENZA VACCINE  Completed   DEXA SCAN  Completed   Zoster Vaccines- Shingrix  Completed   HPV VACCINES  Aged Out   Hepatitis C Screening  Discontinued   -Vaccines up-to-date -Aged out of further colonoscopies -Still gets yearly mammogram and this was done just recently. -Also sees GYN and had recent DEXA scan   Reviewed:  Social History   Socioeconomic History   Marital status: Divorced    Spouse name: Not on file   Number of children: 5   Years of education: Not on file   Highest education level: Bachelor's degree (e.g., BA, AB, BS)  Occupational History   Not on file   Tobacco Use   Smoking status: Never   Smokeless tobacco: Never  Vaping Use   Vaping status: Never Used  Substance and Sexual Activity   Alcohol use: Yes    Comment: occ   Drug use: No   Sexual activity: Not Currently    Birth control/protection: Surgical    Comment: BSO ,INTERCOURSE AGE 52, SEXUAL PARTNERS LESS THAN 5  Other Topics Concern   Not on file  Social History Narrative   Not on file   Social Determinants of Health   Financial Resource Strain: Low Risk  (07/17/2023)   Overall Financial Resource Strain (CARDIA)    Difficulty of Paying Living Expenses: Not hard at all  Food Insecurity: No Food Insecurity (08/19/2023)   Hunger Vital Sign    Worried About Running Out of Food in the Last Year: Never true    Ran Out of Food in the Last Year: Never true  Transportation Needs: No Transportation Needs (08/19/2023)   PRAPARE - Administrator, Civil Service (Medical): No    Lack of Transportation (Non-Medical): No  Physical Activity: Sufficiently Active (07/17/2023)   Exercise Vital Sign    Days of Exercise per Week: 4 days    Minutes of Exercise per Session: 40 min  Stress: Stress Concern Present (07/17/2023)   [employer] of Occupational Health - Occupational Stress Questionnaire    Feeling of Stress : To some extent  Social Connections: Unknown (07/17/2023)   Social Connection  and Isolation Panel [NHANES]    Frequency of Communication with Friends and Family: More than three times a week    Frequency of Social Gatherings with Friends and Family: Twice a week    Attends Religious Services: Not on Marketing executive or Organizations: Yes    Attends Banker Meetings: More than 4 times per year    Marital Status: Divorced  Intimate Partner Violence: Not At Risk (07/18/2023)   Humiliation, Afraid, Rape, and Kick questionnaire    Fear of Current or Ex-Partner: No    Emotionally Abused: No    Physically Abused: No    Sexually Abused: No    Family History  Problem Relation Age of Onset   Heart disease Mother    Hyperlipidemia Mother    Stroke Mother    Heart failure Mother    Diabetes Father        type ll   Heart disease Father 56       sudden death age 110   Hyperlipidemia Father    Breast cancer Maternal Aunt    Past Medical History:  Diagnosis Date   Allergy    Arthritis    Blood transfusion abn reaction or complication, no procedure mishap    82 years old after tonsilectomy   Hypertension    Osteoporosis 04/2019   T score -2.1 improved from prior DEXA   Tibia fracture    left   Vasovagal syncope    2 prior incidences last was in 2015 ; treated in ED , reports no recurrence since then    Past Surgical History:  Procedure Laterality Date   COLONOSCOPY     FOOT SURGERY     bunectomy, hammertoe, bil   LAPAROSCOPIC BILATERAL SALPINGO OOPHERECTOMY     TONSILLECTOMY  10/12/1943   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   Procedure: RIGHT TOTAL KNEE ARTHROPLASTY;  Surgeon: Ollen Gross, MD;  Location: [facility];  Service: Orthopedics;  Laterality: Right;   TOTAL KNEE ARTHROPLASTY Left 08/09/2022   Procedure: TOTAL KNEE ARTHROPLASTY;  Surgeon: Ollen Gross, MD;  Location: [facility];  Service: Orthopedics;  Laterality: Left;    reports that she has never smoked. She has never used smokeless tobacco. She reports current alcohol use. She reports that she does not use drugs. family history includes Breast cancer in her maternal aunt; Diabetes in her father; Heart disease in her mother; Heart disease (age of onset: 39) in her father; Heart failure in her mother; Hyperlipidemia in her father and mother; Stroke in her mother. Allergies  Allergen Reactions   Benadryl [Diphenhydramine] Other (See Comments)    Light headed   Hctz [Hydrochlorothiazide] Other (See Comments)    Fatigue, body aches    Review of Systems  Constitutional:  Negative for chills, fever, malaise/fatigue and weight loss.  Eyes:  Negative for  blurred vision.  Respiratory:  Negative for shortness of breath.   Cardiovascular:  Negative for chest pain.  Gastrointestinal:  Negative for abdominal pain.  Genitourinary:  Negative for dysuria.  Musculoskeletal:  Positive for joint pain.  Neurological:  Negative for dizziness, weakness and headaches.      Objective:     BP 124/78 (BP Location: Left Arm, Cuff Size: Normal)   Pulse 69   Temp 98.6 F (37 C) (Oral)   Ht 5\' 8"  (1.727 m)   Wt 153 lb 9.6 oz (69.7 kg)   SpO2 98%   BMI 23.35 kg/m  BP Readings from Last  3 Encounters:  08/23/23 124/78  06/01/23 (!) 142/62  05/06/23 134/70   Wt Readings from Last 3 Encounters:  08/23/23 153 lb 9.6 oz (69.7 kg)  07/18/23 159 lb (72.1 kg)  06/01/23 159 lb 14.4 oz (72.5 kg)      Physical Exam Vitals reviewed.  Constitutional:      Appearance: She is well-developed.  Eyes:     Pupils: Pupils are equal, round, and reactive to light.  Neck:     Thyroid: No thyromegaly.     Vascular: No JVD.     Comments: No carotid bruits Cardiovascular:     Rate and Rhythm: Normal rate and regular rhythm.     Heart sounds: Normal heart sounds. No murmur heard.    No gallop.  Pulmonary:     Effort: Pulmonary effort is normal. No respiratory distress.     Breath sounds: Normal breath sounds. No wheezing or rales.  Abdominal:     General: There is no distension.     Palpations: Abdomen is soft. There is no mass.     Tenderness: There is no abdominal tenderness. There is no guarding or rebound.  Musculoskeletal:     Cervical back: Neck supple.     Right lower leg: No edema.     Left lower leg: No edema.  Neurological:     Mental Status: She is alert.      No results found for any visits on 08/23/23.  Last CBC Lab Results  Component Value Date   WBC 7.8 06/01/2023   HGB 12.1 06/01/2023   HCT 36.2 06/01/2023   HCT 35.9 (L) 06/01/2023   MCV 94.2 06/01/2023   MCH 31.8 06/01/2023   RDW 13.0 06/01/2023   PLT 215 06/01/2023    Last metabolic panel Lab Results  Component Value Date   GLUCOSE 104 (H) 04/25/2023   NA 137 04/25/2023   K 4.5 04/25/2023   CL 102 04/25/2023   CO2 26 04/25/2023   BUN 24 (H) 04/25/2023   CREATININE 0.96 04/25/2023   GFR 55.17 (L) 04/25/2023   CALCIUM 10.3 04/29/2023   PHOS 2.9 12/10/2013   PROT 6.7 04/29/2023   ALBUMIN 3.4 (L) 04/25/2023   LABGLOB 2.6 06/01/2023   BILITOT 0.5 04/25/2023   ALKPHOS 256 (H) 04/25/2023   AST 19 04/25/2023   ALT 17 04/25/2023   ANIONGAP 8 08/10/2022   Last lipids Lab Results  Component Value Date   CHOL 194 10/24/2020   HDL 67.10 10/24/2020   LDLCALC 108 (H) 10/24/2020   LDLDIRECT 139.3 02/02/2011   TRIG 95.0 10/24/2020   CHOLHDL 3 10/24/2020   Last thyroid functions Lab Results  Component Value Date   TSH 1.70 04/25/2023   Last vitamin B12 and Folate Lab Results  Component Value Date   VITAMINB12 881 06/01/2023      The ASCVD Risk score (Arnett DK, et al., 2019) failed to calculate for the following reasons:   The 2019 ASCVD risk score is only valid for ages 53 to 2    Assessment & Plan:   Problem List Items Addressed This Visit       Unprioritized   Anemia   Relevant Orders   CBC with Differential/Platelet   CMP   Essential hypertension   Other Visit Diagnoses     Physical exam    -  Primary   Hyperlipidemia, unspecified hyperlipidemia type       Relevant Orders   CMP   Lipid panel     82 year old  female here for physical exam.  She has history of hypertension which is currently well-controlled.  She has history of normocytic anemia with extensive workup last summer unrevealing.  This was improving late last summer.  Her revised cardiac risk index for preoperative risk score is 0 which equates to 3.9% 30-day risk of death, MI, cardiac arrest.  She is also looking at likely mild sedation and local versus general anesthesia.  She is felt to be low surgical risk.  -Obtain follow-up labs with CBC, CMP, lipid  panel (per pt request) -Flu vaccine already given -Pneumonia vaccines complete -She plans to continue with annual mammogram -Aged out of further colonoscopies and also aged out of Pap smear  No follow-ups on file.    Evelena Peat, MD

## 2023-08-29 DIAGNOSIS — M25512 Pain in left shoulder: Secondary | ICD-10-CM | POA: Diagnosis not present

## 2023-08-29 DIAGNOSIS — M19012 Primary osteoarthritis, left shoulder: Secondary | ICD-10-CM | POA: Diagnosis not present

## 2023-09-09 ENCOUNTER — Other Ambulatory Visit: Payer: Self-pay | Admitting: Family Medicine

## 2023-09-22 NOTE — H&P (Signed)
Patient's anticipated LOS is less than 2 midnights, meeting these requirements: - Younger than 96 - Lives within 1 hour of care - Has a competent adult at home to recover with post-op recover - NO history of  - Chronic pain requiring opiods  - Diabetes  - Coronary Artery Disease  - Heart failure  - Heart attack  - Stroke  - DVT/VTE  - Cardiac arrhythmia  - Respiratory Failure/COPD  - Renal failure  - Anemia  - Advanced Liver disease     Megan Reilly is an 82 y.o. female.    Chief Complaint: left shoulder pain  HPI: Pt is a 82 y.o. female complaining of left shoulder pain for multiple years. Pain had continually increased since the beginning. X-rays in the clinic show end-stage arthritic changes of the left shoulder. Pt has tried various conservative treatments which have failed to alleviate their symptoms, including injections and therapy. Various options are discussed with the patient. Risks, benefits and expectations were discussed with the patient. Patient understand the risks, benefits and expectations and wishes to proceed with surgery.   PCP:  Kristian Covey, MD  D/C Plans: Home  PMH: Past Medical History:  Diagnosis Date   Allergy    Arthritis    Blood transfusion abn reaction or complication, no procedure mishap    82 years old after tonsilectomy   Hypertension    Osteoporosis 04/2019   T score -2.1 improved from prior DEXA   Tibia fracture    left   Vasovagal syncope    2 prior incidences last was in 2015 ; treated in ED , reports no recurrence since then     PSH: Past Surgical History:  Procedure Laterality Date   COLONOSCOPY     FOOT SURGERY     bunectomy, hammertoe, bil   LAPAROSCOPIC BILATERAL SALPINGO OOPHERECTOMY     TONSILLECTOMY  10/12/1943   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   Procedure: RIGHT TOTAL KNEE ARTHROPLASTY;  Surgeon: Ollen Gross, MD;  Location: [facility];  Service: Orthopedics;  Laterality: Right;   TOTAL KNEE  ARTHROPLASTY Left 08/09/2022   Procedure: TOTAL KNEE ARTHROPLASTY;  Surgeon: Ollen Gross, MD;  Location: [facility];  Service: Orthopedics;  Laterality: Left;    Social History:  reports that she has never smoked. She has never used smokeless tobacco. She reports current alcohol use. She reports that she does not use drugs. BMI: Estimated body mass index is 23.35 kg/m as calculated from the following:   Height as of 08/23/23: 5\' 8"  (1.727 m).   Weight as of 08/23/23: 69.7 kg.  Lab Results  Component Value Date   ALBUMIN 4.4 08/23/2023   Diabetes: Patient does not have a diagnosis of diabetes.     Smoking Status:   reports that she has never smoked. She has never used smokeless tobacco.    Allergies:  Allergies  Allergen Reactions   Benadryl [Diphenhydramine] Other (See Comments)    Light headed   Hctz [Hydrochlorothiazide] Other (See Comments)    Fatigue, body aches    Medications: No current facility-administered medications for this encounter.   Current Outpatient Medications  Medication Sig Dispense Refill   amLODipine (NORVASC) 5 MG tablet TAKE 1 TABLET (5 MG TOTAL) BY MOUTH DAILY. 90 tablet 0   calcium carbonate (OS-CAL) 600 MG TABS Take 300 mg by mouth every other day.     cetirizine (ZYRTEC) 10 MG tablet Take 10 mg by mouth daily as needed for allergies.  Cholecalciferol (VITAMIN D-3 PO) Take 2,500 Units by mouth daily.      fish oil-omega-3 fatty acids 1000 MG capsule Take 1 g by mouth daily.      fluticasone (FLONASE) 50 MCG/ACT nasal spray SPRAY 1 TO 2 SPARYS IN EACH NOSTRIL DAILY. 48 mL 2   latanoprost (XALATAN) 0.005 % ophthalmic solution 1 drop at bedtime.     losartan (COZAAR) 50 MG tablet TAKE 1 TABLET BY MOUTH EVERY DAY 90 tablet 1   Magnesium 400 MG CAPS Take 1 tablet by mouth daily.      Melatonin 3 MG TABS Take 3 mg by mouth. 3-4 times a week     Multiple Vitamin (MULTIVITAMIN WITH MINERALS) TABS tablet Take 1 tablet by mouth every morning.      MYRBETRIQ 50 MG TB24 tablet TAKE 1 TABLET BY MOUTH EVERY DAY 30 tablet 5    No results found for this or any previous visit (from the past 48 hours). No results found.  ROS: Pain with rom of the left upper extremity  Physical Exam: Alert and oriented 82 y.o. female in no acute distress Cranial nerves 2-12 intact Cervical spine: full rom with no tenderness, nv intact distally Chest: active breath sounds bilaterally, no wheeze rhonchi or rales Heart: regular rate and rhythm, no murmur Abd: non tender non distended with active bowel sounds Hip is stable with rom  Left shoulder painful/weak rom Nv intact distally No rashes or edema distally  Assessment/Plan Assessment: left shoulder rotator cuff arthropathy  Plan:  Patient will undergo a left reverse total shoulde by Dr. Ranell Patrick at [place] Risks benefits and expectations were discussed with the patient. Patient understand risks, benefits and expectations and wishes to proceed. Preoperative templating of the joint replacement has been completed, documented, and submitted to the Operating Room personnel in order to optimize intra-operative equipment management.   Alphonsa Overall PA-C, MPAS [facility] is now [employer] [address], [place], [place] [address] Phone: [phone] [email or web address] Facebook  [employer]

## 2023-09-27 NOTE — Patient Instructions (Addendum)
SURGICAL WAITING ROOM VISITATION  Patients having surgery or a procedure may have no more than 2 support people in the waiting area - these visitors may rotate.    Children under the age of 18 must have an adult with them who is not the patient.  Due to an increase in RSV and influenza rates and associated hospitalizations, children ages 87 and under may not visit patients in [facility] hospitals.  If the patient needs to stay at the hospital during part of their recovery, the visitor guidelines for inpatient rooms apply. Pre-op nurse will coordinate an appropriate time for 1 support person to accompany patient in pre-op.  This support person may not rotate.    Please refer to the [facility] website for the visitor guidelines for Inpatients (after your surgery is over and you are in a regular room).    Your procedure is scheduled on: 10/14/23   Report to [facility] Main Entrance    Report to admitting at 6:30 AM   Call this number if you have problems the morning of surgery [phone]   Do not eat food :After Midnight.   After Midnight you may have the following liquids until 6:00 AM DAY OF SURGERY  Water Non-Citrus Juices (without pulp, NO RED-Apple, White grape, White cranberry) Black Coffee (NO MILK/CREAM OR CREAMERS, sugar ok)  Clear Tea (NO MILK/CREAM OR CREAMERS, sugar ok) regular and decaf                             Plain Jell-O (NO RED)                                           Fruit ices (not with fruit pulp, NO RED)                                     Popsicles (NO RED)                                                               Sports drinks like Gatorade (NO RED)               The day of surgery:  Drink ONE (1) Pre-Surgery Clear Ensure at 6:00 AM the morning of surgery. Drink in one sitting. Do not sip.  This drink was given to you during your hospital  pre-op appointment visit. Nothing else to drink after completing the  Pre-Surgery Clear Ensure.           If you have questions, please contact your surgeon's office.   FOLLOW BOWEL PREP AND ANY ADDITIONAL PRE OP INSTRUCTIONS YOU RECEIVED FROM YOUR SURGEON'S OFFICE!!!     Oral Hygiene is also important to reduce your risk of infection.                                    Remember - BRUSH YOUR TEETH THE MORNING OF SURGERY WITH YOUR REGULAR TOOTHPASTE  DENTURES WILL BE REMOVED  PRIOR TO SURGERY PLEASE DO NOT APPLY "Poly grip" OR ADHESIVES!!!   Stop all vitamins and herbal supplements 7 days before surgery.   Take these medicines the morning of surgery with A SIP OF WATER: Tylenol             You may not have any metal on your body including hair pins, jewelry, and body piercing             Do not wear make-up, lotions, powders, perfumes, or deodorant  Do not wear nail polish including gel and S&S, artificial/acrylic nails, or any other type of covering on natural nails including finger and toenails. If you have artificial nails, gel coating, etc. that needs to be removed by a nail salon please have this removed prior to surgery or surgery may need to be canceled/ delayed if the surgeon/ anesthesia feels like they are unable to be safely monitored.   Do not shave  48 hours prior to surgery.    Do not bring valuables to the hospital. [facility] IS NOT             RESPONSIBLE   FOR VALUABLES.   Contacts, glasses, dentures or bridgework may not be worn into surgery.  DO NOT BRING YOUR HOME MEDICATIONS TO THE HOSPITAL. PHARMACY WILL DISPENSE MEDICATIONS LISTED ON YOUR MEDICATION LIST TO YOU DURING YOUR ADMISSION IN THE HOSPITAL!    Patients discharged on the day of surgery will not be allowed to drive home.  Someone NEEDS to stay with you for the first 24 hours after anesthesia.              Please read over the following fact sheets you were given: IF YOU HAVE QUESTIONS ABOUT YOUR PRE-OP INSTRUCTIONS PLEASE CALL [phone] [place]    If you received a COVID test during your pre-op  visit  it is requested that you wear a mask when out in public, stay away from anyone that may not be feeling well and notify your surgeon if you develop symptoms. If you test positive for Covid or have been in contact with anyone that has tested positive in the last 10 days please notify you surgeon.      Pre-operative 5 CHG Bath Instructions   You can play a key role in reducing the risk of infection after surgery. Your skin needs to be as free of germs as possible. You can reduce the number of germs on your skin by washing with CHG (chlorhexidine gluconate) soap before surgery. CHG is an antiseptic soap that kills germs and continues to kill germs even after washing.   DO NOT use if you have an allergy to chlorhexidine/CHG or antibacterial soaps. If your skin becomes reddened or irritated, stop using the CHG and notify one of our RNs at [phone].   Please shower with the CHG soap starting 4 days before surgery using the following schedule:     Please keep in mind the following:  DO NOT shave, including legs and underarms, starting the day of your first shower.   You may shave your face at any point before/day of surgery.  Place clean sheets on your bed the day you start using CHG soap. Use a clean washcloth (not used since being washed) for each shower. DO NOT sleep with pets once you start using the CHG.   CHG Shower Instructions:  If you choose to wash your hair and private area, wash first with your normal shampoo/soap.  After you use  shampoo/soap, rinse your hair and body thoroughly to remove shampoo/soap residue.  Turn the water OFF and apply about 3 tablespoons (45 ml) of CHG soap to a CLEAN washcloth.  Apply CHG soap ONLY FROM YOUR NECK DOWN TO YOUR TOES (washing for 3-5 minutes)  DO NOT use CHG soap on face, private areas, open wounds, or sores.  Pay special attention to the area where your surgery is being performed.  If you are having back surgery, having someone wash  your back for you may be helpful. Wait 2 minutes after CHG soap is applied, then you may rinse off the CHG soap.  Pat dry with a clean towel  Put on clean clothes/pajamas   If you choose to wear lotion, please use ONLY the CHG-compatible lotions on the back of this paper.     Additional instructions for the day of surgery: DO NOT APPLY any lotions, deodorants, cologne, or perfumes.   Put on clean/comfortable clothes.  Brush your teeth.  Ask your nurse before applying any prescription medications to the skin.      CHG Compatible Lotions   Aveeno Moisturizing lotion  Cetaphil Moisturizing Cream  Cetaphil Moisturizing Lotion  Clairol Herbal Essence Moisturizing Lotion, Dry Skin  Clairol Herbal Essence Moisturizing Lotion, Extra Dry Skin  Clairol Herbal Essence Moisturizing Lotion, Normal Skin  Curel Age Defying Therapeutic Moisturizing Lotion with Alpha Hydroxy  Curel Extreme Care Body Lotion  Curel Soothing Hands Moisturizing Hand Lotion  Curel Therapeutic Moisturizing Cream, Fragrance-Free  Curel Therapeutic Moisturizing Lotion, Fragrance-Free  Curel Therapeutic Moisturizing Lotion, Original Formula  Eucerin Daily Replenishing Lotion  Eucerin Dry Skin Therapy Plus Alpha Hydroxy Crme  Eucerin Dry Skin Therapy Plus Alpha Hydroxy Lotion  Eucerin Original Crme  Eucerin Original Lotion  Eucerin Plus Crme Eucerin Plus Lotion  Eucerin TriLipid Replenishing Lotion  Keri Anti-Bacterial Hand Lotion  Keri Deep Conditioning Original Lotion Dry Skin Formula Softly Scented  Keri Deep Conditioning Original Lotion, Fragrance Free Sensitive Skin Formula  Keri Lotion Fast Absorbing Fragrance Free Sensitive Skin Formula  Keri Lotion Fast Absorbing Softly Scented Dry Skin Formula  Keri Original Lotion  Keri Skin Renewal Lotion Keri Silky Smooth Lotion  Keri Silky Smooth Sensitive Skin Lotion  Nivea Body Creamy Conditioning Oil  Nivea Body Extra Enriched Lotion  Nivea Body Original  Lotion  Nivea Body Sheer Moisturizing Lotion Nivea Crme  Nivea Skin Firming Lotion  NutraDerm 30 Skin Lotion  NutraDerm Skin Lotion  NutraDerm Therapeutic Skin Cream  NutraDerm Therapeutic Skin Lotion  ProShield Protective Hand Cream  Provon moisturizing lotion   Incentive Spirometer  An incentive spirometer is a tool that can help keep your lungs clear and active. This tool measures how well you are filling your lungs with each breath. Taking long deep breaths may help reverse or decrease the chance of developing breathing (pulmonary) problems (especially infection) following: A long period of time when you are unable to move or be active. BEFORE THE PROCEDURE  If the spirometer includes an indicator to show your best effort, your nurse or respiratory therapist will set it to a desired goal. If possible, sit up straight or lean slightly forward. Try not to slouch. Hold the incentive spirometer in an upright position. INSTRUCTIONS FOR USE  Sit on the edge of your bed if possible, or sit up as far as you can in bed or on a chair. Hold the incentive spirometer in an upright position. Breathe out normally. Place the mouthpiece in your mouth and seal your lips  tightly around it. Breathe in slowly and as deeply as possible, raising the piston or the ball toward the top of the column. Hold your breath for 3-5 seconds or for as long as possible. Allow the piston or ball to fall to the bottom of the column. Remove the mouthpiece from your mouth and breathe out normally. Rest for a few seconds and repeat Steps 1 through 7 at least 10 times every 1-2 hours when you are awake. Take your time and take a few normal breaths between deep breaths. The spirometer may include an indicator to show your best effort. Use the indicator as a goal to work toward during each repetition. After each set of 10 deep breaths, practice coughing to be sure your lungs are clear. If you have an incision (the cut made at  the time of surgery), support your incision when coughing by placing a pillow or rolled up towels firmly against it. Once you are able to get out of bed, walk around indoors and cough well. You may stop using the incentive spirometer when instructed by your caregiver.  RISKS AND COMPLICATIONS Take your time so you do not get dizzy or light-headed. If you are in pain, you may need to take or ask for pain medication before doing incentive spirometry. It is harder to take a deep breath if you are having pain. AFTER USE Rest and breathe slowly and easily. It can be helpful to keep track of a log of your progress. Your caregiver can provide you with a simple table to help with this. If you are using the spirometer at home, follow these instructions: SEEK MEDICAL CARE IF:  You are having difficultly using the spirometer. You have trouble using the spirometer as often as instructed. Your pain medication is not giving enough relief while using the spirometer. You develop fever of 100.5 F (38.1 C) or higher. SEEK IMMEDIATE MEDICAL CARE IF:  You cough up bloody sputum that had not been present before. You develop fever of 102 F (38.9 C) or greater. You develop worsening pain at or near the incision site. MAKE SURE YOU:  Understand these instructions. Will watch your condition. Will get help right away if you are not doing well or get worse. Document Released: 02/07/2007 Document Revised: 12/20/2011 Document Reviewed: 04/10/2007 ExitCare Patient Information 2014 [place] [employer].   ________________________________________________________________________  2020 [facility]- Preparing for Total Shoulder Arthroplasty    Before surgery, you can play an important role. Because skin is not sterile, your skin needs to be as free of germs as possible. You can reduce the number of germs on your skin by using the following products. Benzoyl Peroxide Gel Reduces the number of germs present on the  skin Applied twice a day to shoulder area starting two days before surgery    ==================================================================  Please follow these instructions carefully:  BENZOYL PEROXIDE 5% GEL  Please do not use if you have an allergy to benzoyl peroxide.   If your skin becomes reddened/irritated stop using the benzoyl peroxide.  Starting two days before surgery, apply as follows: Apply benzoyl peroxide in the morning and at night. Apply after taking a shower. If you are not taking a shower clean entire shoulder front, back, and side along with the armpit with a clean wet washcloth.  Place a quarter-sized dollop on your shoulder and rub in thoroughly, making sure to cover the front, back, and side of your shoulder, along with the armpit.   2 days before ____ AM  ____ PM              1 day before ____ AM   ____ PM                         Do this twice a day for two days.  (Last application is the night before surgery, AFTER using the CHG soap as described below).  Do NOT apply benzoyl peroxide gel on the day of surgery.

## 2023-09-27 NOTE — Progress Notes (Addendum)
COVID Vaccine Completed: yes  Date of COVID positive in last 90 days: no  PCP - Evelena Peat, MD Cardiologist - n/a  Chest x-ray - 04/12/23 Epic EKG - 10/03/23 Epic/chart Stress Test - n/a ECHO - 2015 Cardiac Cath - n/a Pacemaker/ICD device last checked: n/a Spinal Cord Stimulator: n/a  Bowel Prep - no  Sleep Study - n/a CPAP -   Fasting Blood Sugar - n/a Checks Blood Sugar _____ times a day  Last dose of GLP1 agonist-  N/A GLP1 instructions:  Hold 7 days before surgery    Last dose of SGLT-2 inhibitors-  N/A SGLT-2 instructions:  Hold 3 days before surgery    Blood Thinner Instructions:  n/a Aspirin Instructions: Last Dose:  Activity level: Can go up a flight of stairs and perform activities of daily living without stopping and without symptoms of chest pain or shortness of breath.  Anesthesia review:   Patient denies shortness of breath, fever, cough and chest pain at PAT appointment  Patient verbalized understanding of instructions that were given to them at the PAT appointment. Patient was also instructed that they will need to review over the PAT instructions again at home before surgery.

## 2023-10-03 ENCOUNTER — Encounter (HOSPITAL_COMMUNITY)
Admission: RE | Admit: 2023-10-03 | Discharge: 2023-10-03 | Disposition: A | Discharge status: Home / Self Care | Payer: Medicare HMO | Source: Ambulatory Visit | Attending: Orthopedic Surgery | Admitting: Orthopedic Surgery

## 2023-10-03 ENCOUNTER — Encounter (HOSPITAL_COMMUNITY): Payer: Self-pay

## 2023-10-03 ENCOUNTER — Other Ambulatory Visit: Payer: Self-pay

## 2023-10-03 VITALS — BP 134/69 | HR 67 | Temp 98.7°F | Resp 14 | Ht 68.0 in | Wt 159.0 lb

## 2023-10-03 DIAGNOSIS — I10 Essential (primary) hypertension: Secondary | ICD-10-CM | POA: Insufficient documentation

## 2023-10-03 DIAGNOSIS — I498 Other specified cardiac arrhythmias: Secondary | ICD-10-CM | POA: Diagnosis not present

## 2023-10-03 DIAGNOSIS — Z01818 Encounter for other preprocedural examination: Secondary | ICD-10-CM | POA: Diagnosis not present

## 2023-10-03 LAB — CBC
HCT: 38.1 % (ref 36.0–46.0)
Hemoglobin: 12.5 g/dL (ref 12.0–15.0)
MCH: 32.3 pg (ref 26.0–34.0)
MCHC: 32.8 g/dL (ref 30.0–36.0)
MCV: 98.4 fL (ref 80.0–100.0)
Platelets: 219 10*3/uL (ref 150–400)
RBC: 3.87 MIL/uL (ref 3.87–5.11)
RDW: 12.8 % (ref 11.5–15.5)
WBC: 7.9 10*3/uL (ref 4.0–10.5)
nRBC: 0 % (ref 0.0–0.2)

## 2023-10-03 LAB — BASIC METABOLIC PANEL
Anion gap: 7 (ref 5–15)
BUN: 28 mg/dL — ABNORMAL HIGH (ref 8–23)
CO2: 24 mmol/L (ref 22–32)
Calcium: 10.3 mg/dL (ref 8.9–10.3)
Chloride: 108 mmol/L (ref 98–111)
Creatinine, Ser: 0.95 mg/dL (ref 0.44–1.00)
GFR, Estimated: 60 mL/min — ABNORMAL LOW (ref >60)
Glucose, Bld: 101 mg/dL — ABNORMAL HIGH (ref 70–99)
Potassium: 4.1 mmol/L (ref 3.5–5.1)
Sodium: 139 mmol/L (ref 135–145)

## 2023-10-03 LAB — SURGICAL PCR SCREEN
MRSA, PCR: NEGATIVE
Staphylococcus aureus: NEGATIVE

## 2023-10-13 ENCOUNTER — Encounter (HOSPITAL_COMMUNITY): Payer: Self-pay | Admitting: Orthopedic Surgery

## 2023-10-14 ENCOUNTER — Other Ambulatory Visit: Payer: Self-pay

## 2023-10-14 ENCOUNTER — Ambulatory Visit (HOSPITAL_COMMUNITY)
Admission: RE | Admit: 2023-10-14 | Discharge: 2023-10-14 | Disposition: A | Discharge status: Home / Self Care | Payer: Medicare HMO | Attending: Orthopedic Surgery | Admitting: Orthopedic Surgery

## 2023-10-14 ENCOUNTER — Encounter (HOSPITAL_COMMUNITY)
Admission: RE | Disposition: A | Discharge status: Home / Self Care | Payer: Self-pay | Source: Home / Self Care | Attending: Orthopedic Surgery

## 2023-10-14 ENCOUNTER — Ambulatory Visit (HOSPITAL_COMMUNITY): Payer: Medicare HMO

## 2023-10-14 ENCOUNTER — Encounter (HOSPITAL_COMMUNITY): Payer: Self-pay | Admitting: Orthopedic Surgery

## 2023-10-14 ENCOUNTER — Ambulatory Visit (HOSPITAL_COMMUNITY): Payer: Medicare HMO | Admitting: Anesthesiology

## 2023-10-14 DIAGNOSIS — M75102 Unspecified rotator cuff tear or rupture of left shoulder, not specified as traumatic: Secondary | ICD-10-CM | POA: Insufficient documentation

## 2023-10-14 DIAGNOSIS — Z471 Aftercare following joint replacement surgery: Secondary | ICD-10-CM | POA: Diagnosis not present

## 2023-10-14 DIAGNOSIS — Z79899 Other long term (current) drug therapy: Secondary | ICD-10-CM | POA: Insufficient documentation

## 2023-10-14 DIAGNOSIS — G8918 Other acute postprocedural pain: Secondary | ICD-10-CM | POA: Diagnosis not present

## 2023-10-14 DIAGNOSIS — I10 Essential (primary) hypertension: Secondary | ICD-10-CM | POA: Diagnosis not present

## 2023-10-14 DIAGNOSIS — M12812 Other specific arthropathies, not elsewhere classified, left shoulder: Secondary | ICD-10-CM

## 2023-10-14 DIAGNOSIS — M12811 Other specific arthropathies, not elsewhere classified, right shoulder: Secondary | ICD-10-CM | POA: Diagnosis not present

## 2023-10-14 DIAGNOSIS — Z96612 Presence of left artificial shoulder joint: Secondary | ICD-10-CM | POA: Diagnosis not present

## 2023-10-14 HISTORY — PX: REVERSE SHOULDER ARTHROPLASTY: SHX5054

## 2023-10-14 SURGERY — ARTHROPLASTY, SHOULDER, TOTAL, REVERSE
Anesthesia: General | Site: Shoulder | Laterality: Left

## 2023-10-14 MED ORDER — CHLORHEXIDINE GLUCONATE 0.12 % MT SOLN
15.0000 mL | Freq: Once | OROMUCOSAL | Status: AC
  Administered 2023-10-14: 15 mL via OROMUCOSAL

## 2023-10-14 MED ORDER — TRANEXAMIC ACID-NACL 1000-0.7 MG/100ML-% IV SOLN: 1000.0000 mg | Freq: Once | INTRAVENOUS | Status: DC

## 2023-10-14 MED ORDER — TRANEXAMIC ACID-NACL 1000-0.7 MG/100ML-% IV SOLN
1000.0000 mg | INTRAVENOUS | Status: AC
  Administered 2023-10-14: 1000 mg via INTRAVENOUS
  Filled 2023-10-14: qty 100

## 2023-10-14 MED ORDER — FENTANYL CITRATE (PF) 100 MCG/2ML IJ SOLN
INTRAMUSCULAR | Status: AC
  Filled 2023-10-14: qty 2

## 2023-10-14 MED ORDER — ONDANSETRON HCL 4 MG/2ML IJ SOLN: 4.0000 mg | Freq: Once | INTRAMUSCULAR | Status: DC | PRN

## 2023-10-14 MED ORDER — BUPIVACAINE-EPINEPHRINE 0.25% -1:200000 IJ SOLN
INTRAMUSCULAR | Status: AC
  Filled 2023-10-14: qty 1

## 2023-10-14 MED ORDER — AMISULPRIDE (ANTIEMETIC) 5 MG/2ML IV SOLN: 10.0000 mg | Freq: Once | INTRAVENOUS | Status: DC | PRN

## 2023-10-14 MED ORDER — TRAMADOL HCL 50 MG PO TABS: 50.0000 mg | ORAL_TABLET | Freq: Four times a day (QID) | ORAL | 0 refills | Status: DC | PRN

## 2023-10-14 MED ORDER — FENTANYL CITRATE (PF) 100 MCG/2ML IJ SOLN
INTRAMUSCULAR | Status: DC | PRN
  Administered 2023-10-14: 50 ug via INTRAVENOUS

## 2023-10-14 MED ORDER — LIDOCAINE 2% (20 MG/ML) 5 ML SYRINGE
INTRAMUSCULAR | Status: DC | PRN
  Administered 2023-10-14: 60 mg via INTRAVENOUS

## 2023-10-14 MED ORDER — ORAL CARE MOUTH RINSE: 15.0000 mL | Freq: Once | OROMUCOSAL | Status: AC

## 2023-10-14 MED ORDER — CEFAZOLIN SODIUM-DEXTROSE 2-4 GM/100ML-% IV SOLN
2.0000 g | INTRAVENOUS | Status: AC
  Administered 2023-10-14: 2 g via INTRAVENOUS
  Filled 2023-10-14: qty 100

## 2023-10-14 MED ORDER — BUPIVACAINE LIPOSOME 1.3 % IJ SUSP
INTRAMUSCULAR | Status: DC | PRN
  Administered 2023-10-14: 10 mL via PERINEURAL

## 2023-10-14 MED ORDER — LACTATED RINGERS IV SOLN: INTRAVENOUS | Status: DC

## 2023-10-14 MED ORDER — PHENYLEPHRINE HCL-NACL 20-0.9 MG/250ML-% IV SOLN
INTRAVENOUS | Status: DC | PRN
  Administered 2023-10-14: 55 ug/min via INTRAVENOUS

## 2023-10-14 MED ORDER — OXYCODONE HCL 5 MG PO TABS: 5.0000 mg | ORAL_TABLET | Freq: Once | ORAL | Status: DC | PRN

## 2023-10-14 MED ORDER — ROCURONIUM BROMIDE 10 MG/ML (PF) SYRINGE
PREFILLED_SYRINGE | INTRAVENOUS | Status: DC | PRN
  Administered 2023-10-14: 60 mg via INTRAVENOUS

## 2023-10-14 MED ORDER — FENTANYL CITRATE PF 50 MCG/ML IJ SOSY
50.0000 ug | PREFILLED_SYRINGE | INTRAMUSCULAR | Status: DC | PRN
  Administered 2023-10-14: 100 ug via INTRAVENOUS
  Filled 2023-10-14: qty 2

## 2023-10-14 MED ORDER — STERILE WATER FOR IRRIGATION IR SOLN
Status: DC | PRN
  Administered 2023-10-14: 1000 mL

## 2023-10-14 MED ORDER — ROPIVACAINE HCL 5 MG/ML IJ SOLN
INTRAMUSCULAR | Status: DC | PRN
  Administered 2023-10-14: 10 mL via PERINEURAL

## 2023-10-14 MED ORDER — 0.9 % SODIUM CHLORIDE (POUR BTL) OPTIME
TOPICAL | Status: DC | PRN
  Administered 2023-10-14: 1000 mL

## 2023-10-14 MED ORDER — DEXAMETHASONE SODIUM PHOSPHATE 10 MG/ML IJ SOLN
INTRAMUSCULAR | Status: DC | PRN
  Administered 2023-10-14: 6 mg via INTRAVENOUS

## 2023-10-14 MED ORDER — FENTANYL CITRATE PF 50 MCG/ML IJ SOSY: 25.0000 ug | PREFILLED_SYRINGE | INTRAMUSCULAR | Status: DC | PRN

## 2023-10-14 MED ORDER — OXYCODONE HCL 5 MG/5ML PO SOLN
5.0000 mg | Freq: Once | ORAL | Status: DC | PRN
Start: 2023-10-14 — End: 2023-10-14

## 2023-10-14 MED ORDER — ONDANSETRON HCL 4 MG/2ML IJ SOLN
INTRAMUSCULAR | Status: DC | PRN
  Administered 2023-10-14: 4 mg via INTRAVENOUS

## 2023-10-14 MED ORDER — MIDAZOLAM HCL 2 MG/2ML IJ SOLN: 1.0000 mg | INTRAMUSCULAR | Status: DC | PRN

## 2023-10-14 MED ORDER — PROPOFOL 10 MG/ML IV BOLUS
INTRAVENOUS | Status: DC | PRN
  Administered 2023-10-14: 120 mg via INTRAVENOUS

## 2023-10-14 MED ORDER — SUGAMMADEX SODIUM 200 MG/2ML IV SOLN
INTRAVENOUS | Status: DC | PRN
  Administered 2023-10-14: 200 mg via INTRAVENOUS

## 2023-10-14 MED ORDER — BUPIVACAINE-EPINEPHRINE (PF) 0.25% -1:200000 IJ SOLN
INTRAMUSCULAR | Status: DC | PRN
  Administered 2023-10-14: 16 mL

## 2023-10-14 SURGICAL SUPPLY — 61 items
BAG COUNTER SPONGE SURGICOUNT (BAG) IMPLANT
BAG ZIPLOCK 12X15 (MISCELLANEOUS) IMPLANT
BIT DRILL 1.6MX128 (BIT) IMPLANT
BIT DRILL 170X2.5X (BIT) IMPLANT
BIT DRL 170X2.5X (BIT) ×1
BLADE SAG 18X100X1.27 (BLADE) ×2 IMPLANT
COVER BACK TABLE 60X90IN (DRAPES) ×2 IMPLANT
COVER SURGICAL LIGHT HANDLE (MISCELLANEOUS) ×2 IMPLANT
CUP D38 DXTEND STAND PLUS 6 HU (Orthopedic Implant) ×1 IMPLANT
CUP STD D38 DXTEND PLUS 6 HU (Orthopedic Implant) IMPLANT
DRAPE INCISE IOBAN 66X45 STRL (DRAPES) ×2 IMPLANT
DRAPE SHEET LG 3/4 BI-LAMINATE (DRAPES) ×2 IMPLANT
DRAPE SURG ORHT 6 SPLT 77X108 (DRAPES) ×4 IMPLANT
DRAPE TOP 10253 STERILE (DRAPES) ×2 IMPLANT
DRAPE U-SHAPE 47X51 STRL (DRAPES) ×2 IMPLANT
DRSG ADAPTIC 3X8 NADH LF (GAUZE/BANDAGES/DRESSINGS) ×2 IMPLANT
DURAPREP 26ML APPLICATOR (WOUND CARE) ×2 IMPLANT
ELECT BLADE TIP CTD 4 INCH (ELECTRODE) ×2 IMPLANT
ELECT NDL TIP 2.8 STRL (NEEDLE) ×2 IMPLANT
ELECT NEEDLE TIP 2.8 STRL (NEEDLE) ×1
ELECT REM PT RETURN 15FT ADLT (MISCELLANEOUS) ×2 IMPLANT
EPI LT SZ 1 (Orthopedic Implant) ×1 IMPLANT
EPIPHYSIS LT SZ 1 (Orthopedic Implant) IMPLANT
FACESHIELD WRAPAROUND (MASK) ×1
FACESHIELD WRAPAROUND OR TEAM (MASK) ×2 IMPLANT
GAUZE PAD ABD 8X10 STRL (GAUZE/BANDAGES/DRESSINGS) ×2 IMPLANT
GAUZE SPONGE 4X4 12PLY STRL (GAUZE/BANDAGES/DRESSINGS) ×2 IMPLANT
GLENOSPHERE DELTA XTEND LAT 38 (Miscellaneous) IMPLANT
GLOVE BIOGEL PI IND STRL 7.5 (GLOVE) ×2 IMPLANT
GLOVE BIOGEL PI IND STRL 8.5 (GLOVE) ×2 IMPLANT
GLOVE ORTHO TXT STRL SZ7.5 (GLOVE) ×2 IMPLANT
GLOVE SURG ORTHO 8.5 STRL (GLOVE) ×2 IMPLANT
GOWN STRL REUS W/ TWL XL LVL3 (GOWN DISPOSABLE) ×4 IMPLANT
KIT BASIN OR (CUSTOM PROCEDURE TRAY) ×2 IMPLANT
KIT TURNOVER KIT A (KITS) IMPLANT
MANIFOLD NEPTUNE II (INSTRUMENTS) ×2 IMPLANT
METAGLENE DELTA EXTEND (Trauma) IMPLANT
METAGLENE DXTEND (Trauma) ×1 IMPLANT
NDL MAYO CATGUT SZ4 TPR NDL (NEEDLE) IMPLANT
NEEDLE MAYO CATGUT SZ4 (NEEDLE)
NS IRRIG 1000ML POUR BTL (IV SOLUTION) ×2 IMPLANT
PACK SHOULDER (CUSTOM PROCEDURE TRAY) ×2 IMPLANT
PIN GUIDE 1.2 (PIN) IMPLANT
PIN GUIDE GLENOPHERE 1.5MX300M (PIN) IMPLANT
PIN METAGLENE 2.5 (PIN) IMPLANT
RESTRAINT HEAD UNIVERSAL NS (MISCELLANEOUS) ×2 IMPLANT
SCREW 4.5X36MM (Screw) IMPLANT
SCREW LOCK 42 (Screw) IMPLANT
SLING ARM FOAM STRAP LRG (SOFTGOODS) IMPLANT
SPIKE FLUID TRANSFER (MISCELLANEOUS) ×2 IMPLANT
SPONGE T-LAP 4X18 ~~LOC~~+RFID (SPONGE) IMPLANT
STEM DELTA DIA 10 HA (Stem) IMPLANT
STRIP CLOSURE SKIN 1/2X4 (GAUZE/BANDAGES/DRESSINGS) ×2 IMPLANT
SUT FIBERWIRE #2 38 T-5 BLUE (SUTURE) ×1
SUT MNCRL AB 4-0 PS2 18 (SUTURE) ×2 IMPLANT
SUT VIC AB 0 CT1 36 (SUTURE) ×2 IMPLANT
SUT VIC AB 0 CT2 27 (SUTURE) ×2 IMPLANT
SUT VIC AB 2-0 CT1 TAPERPNT 27 (SUTURE) ×2 IMPLANT
SUTURE FIBERWR #2 38 T-5 BLUE (SUTURE) ×2 IMPLANT
TOWEL GREEN STERILE FF (TOWEL DISPOSABLE) IMPLANT
TOWEL OR 17X26 10 PK STRL BLUE (TOWEL DISPOSABLE) ×2 IMPLANT

## 2023-10-14 NOTE — Anesthesia Procedure Notes (Signed)
 Procedure Name: Intubation Date/Time: 10/14/2023 9:45 AM  Performed by: Vincenzo Show, CRNAPre-anesthesia Checklist: Patient identified, Emergency Drugs available, Suction available, Patient being monitored and Timeout performed Patient Re-evaluated:Patient Re-evaluated prior to induction Oxygen Delivery Method: Circle system utilized Preoxygenation: Pre-oxygenation with 100% oxygen Induction Type: IV induction Ventilation: Mask ventilation without difficulty Laryngoscope Size: Mac and 3 Grade View: Grade II Tube type: Oral Tube size: 7.0 mm Number of attempts: 1 Airway Equipment and Method: Stylet Placement Confirmation: ETT inserted through vocal cords under direct vision, positive ETCO2, CO2 detector and breath sounds checked- equal and bilateral Secured at: 21 cm Tube secured with: Tape Dental Injury: Teeth and Oropharynx as per pre-operative assessment  Comments: ATOI

## 2023-10-14 NOTE — Interval H&P Note (Signed)
 History and Physical Interval Note:  10/14/2023 9:21 AM  Megan Reilly  has presented today for surgery, with the diagnosis of Left shoulder rotator cuff arthropathy.  The various methods of treatment have been discussed with the patient and family. After consideration of risks, benefits and other options for treatment, the patient has consented to  Procedure(s) with comments: REVERSE SHOULDER ARTHROPLASTY (Left) - as a surgical intervention.  The patient's history has been reviewed, patient examined, no change in status, stable for surgery.  I have reviewed the patient's chart and labs.  Questions were answered to the patient's satisfaction.     Elspeth JONELLE Her

## 2023-10-14 NOTE — Care Plan (Signed)
 Ortho Bundle Case Management Note  Patient Details  Name: Jerrye Seebeck MRN: [id number] Date of Birth: Feb 13, 1941                  L Rev TSA on 10-14-23  DCP: Home with dtr  DME: No needs  PT: HEP   DME Arranged:  N/A DME Agency:      Additional Comments: Please contact me with any questions of if this plan should need to change.   Kate DELENA Kraft, RN,CCM [facility]  [phone] 10/14/2023, 9:54 AM

## 2023-10-14 NOTE — Transfer of Care (Signed)
 Immediate Anesthesia Transfer of Care Note  Patient: Megan Reilly  Procedure(s) Performed: Procedure(s) with comments: REVERSE SHOULDER ARTHROPLASTY (Left) -  Patient Location: PACU  Anesthesia Type:General  Level of Consciousness:  sedated, patient cooperative and responds to stimulation  Airway & Oxygen Therapy:Patient Spontanous Breathing and Patient connected to face mask oxgen  Post-op Assessment:  Report given to PACU RN and Post -op Vital signs reviewed and stable  Post vital signs:  Reviewed and stable  Last Vitals:  Vitals:   10/14/23 0920 10/14/23 1132  BP: (!) 144/78 (!) 149/69  Pulse: 63 71  Resp: 17 15  Temp:  (!) 36.4 C  SpO2: 96% 100%    Complications: No apparent anesthesia complications

## 2023-10-14 NOTE — Anesthesia Procedure Notes (Signed)
 Anesthesia Regional Block: Interscalene brachial plexus block   Pre-Anesthetic Checklist: , timeout performed,  Correct Patient, Correct Site, Correct Laterality,  Correct Procedure, Correct Position, site marked,  Risks and benefits discussed,  Surgical consent,  Pre-op evaluation,  At surgeon's request and post-op pain management  Laterality: Left  Prep: Maximum Sterile Barrier Precautions used, chloraprep       Needles:  Injection technique: Single-shot  Needle Type: Echogenic Stimulator Needle     Needle Length: 9cm  Needle Gauge: 22     Additional Needles:   Procedures:,,,, ultrasound used (permanent image in chart),,    Narrative:  Start time: 10/14/2023 9:10 AM End time: 10/14/2023 9:15 AM Injection made incrementally with aspirations every 5 mL.  Performed by: Personally  Anesthesiologist: Merla Almarie HERO, DO  Additional Notes: Monitors applied. No increased pain on injection. No increased resistance to injection. Injection made in 5cc increments. Good needle visualization. Patient tolerated procedure well.

## 2023-10-14 NOTE — Anesthesia Preprocedure Evaluation (Addendum)
 Anesthesia Evaluation  Patient identified by MRN, date of birth, ID band Patient awake    Reviewed: Allergy & Precautions, NPO status , Patient's Chart, lab work & pertinent test results  Airway Mallampati: III  TM Distance: >3 FB Neck ROM: Full    Dental  (+) Teeth Intact, Dental Advisory Given   Pulmonary  Snores at night   Pulmonary exam normal breath sounds clear to auscultation       Cardiovascular hypertension (135/64 preop), Pt. on medications Normal cardiovascular exam+ Valvular Problems/Murmurs (mild MR) MR  Rhythm:Regular Rate:Normal  Echo 2015 - Left ventricle: The cavity size was normal. Systolic    function was normal. The estimated ejection fraction was    in the range of 55% to 60%. Wall motion was normal; there    were no regional wall motion abnormalities.  - Mitral valve: Mild regurgitation.  - Atrial septum: No defect or patent foramen ovale was    identified.  - Pulmonary arteries: PA peak pressure: 32mm Hg (S).     Neuro/Psych  Headaches  negative psych ROS   GI/Hepatic negative GI ROS, Neg liver ROS,,,  Endo/Other  negative endocrine ROS    Renal/GU negative Renal ROS  negative genitourinary   Musculoskeletal  (+) Arthritis , Osteoarthritis,    Abdominal   Peds  Hematology negative hematology ROS (+)   Anesthesia Other Findings   Reproductive/Obstetrics negative OB ROS                             Anesthesia Physical Anesthesia Plan  ASA: 3  Anesthesia Plan: General   Post-op Pain Management: Regional block*   Induction: Intravenous  PONV Risk Score and Plan: 3 and Ondansetron , Dexamethasone , Midazolam  and Treatment may vary due to age or medical condition  Airway Management Planned: Oral ETT  Additional Equipment: None  Intra-op Plan:   Post-operative Plan: Extubation in OR  Informed Consent: I have reviewed the patients History and Physical,  chart, labs and discussed the procedure including the risks, benefits and alternatives for the proposed anesthesia with the patient or authorized representative who has indicated his/her understanding and acceptance.     Dental advisory given  Plan Discussed with: CRNA  Anesthesia Plan Comments:        Anesthesia Quick Evaluation

## 2023-10-14 NOTE — Brief Op Note (Signed)
 10/14/2023  11:22 AM  PATIENT:  Megan Reilly  83 y.o. female  PRE-OPERATIVE DIAGNOSIS:  Left shoulder rotator cuff arthropathy, end stage  POST-OPERATIVE DIAGNOSIS:  Left shoulder rotator cuff arthropathy, end stage  PROCEDURE:  Procedure(s) with comments: REVERSE SHOULDER ARTHROPLASTY (Left) - DePuy Delta Xtend with NO subscap repair  SURGEON:  Surgeons and Role:    DEWAINE Kay Kemps, MD - Primary  PHYSICIAN ASSISTANT:   ASSISTANTS: Debby KATHEE Fireman, PA-C   ANESTHESIA:   regional and general  EBL:  150 mL   BLOOD ADMINISTERED:none  DRAINS: none   LOCAL MEDICATIONS USED:  MARCAINE      SPECIMEN:  No Specimen  DISPOSITION OF SPECIMEN:  N/A  COUNTS:  YES  TOURNIQUET:  * No tourniquets in log *  DICTATION: .Other Dictation: Dictation Number [id number]  PLAN OF CARE: Discharge to home after PACU  PATIENT DISPOSITION:  PACU - hemodynamically stable.   Delay start of Pharmacological VTE agent (>24hrs) due to surgical blood loss or risk of bleeding: not applicable

## 2023-10-14 NOTE — Discharge Instructions (Signed)
 Ice to the shoulder constantly.  Keep the incision covered and clean and dry for one week, then ok to get it wet in the shower. Please change the gauze surgical bandage on Sunday to the Aquacel bandage(silver package). Leave that on until next Friday when you remove that and leave the incision open to air  Do exercise as instructed several times per day.  DO NOT reach behind your back or push up out of a chair with the operative arm.  Use a sling while you are up and around for comfort, may remove while seated.  Keep pillow propped behind the operative elbow.  Follow up with Dr Kay in two weeks in the office, call [phone] for appt  Please call Dr Kay (cell) [phone] with any questions or concerns

## 2023-10-14 NOTE — Anesthesia Postprocedure Evaluation (Signed)
 Anesthesia Post Note  Patient: Megan Reilly  Procedure(s) Performed: REVERSE SHOULDER ARTHROPLASTY (Left: Shoulder)     Patient location during evaluation: PACU Anesthesia Type: General Level of consciousness: awake and alert, oriented and patient cooperative Pain management: pain level controlled Vital Signs Assessment: post-procedure vital signs reviewed and stable Respiratory status: spontaneous breathing, nonlabored ventilation and respiratory function stable Cardiovascular status: blood pressure returned to baseline and stable Postop Assessment: no apparent nausea or vomiting Anesthetic complications: no   No notable events documented.  Last Vitals:  Vitals:   10/14/23 1200 10/14/23 1238  BP: 126/68 (!) 126/57  Pulse: 61   Resp: 16   Temp:    SpO2: 94%     Last Pain:  Vitals:   10/14/23 1229  TempSrc:   PainSc: 0-No pain                 Almarie CHRISTELLA Marchi

## 2023-10-14 NOTE — Evaluation (Signed)
 Occupational Therapy Evaluation Patient Details Name: Megan Reilly MRN: [id number] DOB: 21-Feb-1941 Today's Date: 10/14/2023   History of Present Illness Ms. 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   Clinical Impression   Pt is s/p shoulder replacement of left non-dominant upper extremity on 10-14-23.  Therapist provided education and instruction to patient and her daughter with regards to ROM/exercises, post-op precautions, UE and sling positioning, donning upper extremity clothing, recommendations for bathing while maintaining shoulder precautions, use of ice for pain and edema management, and correctly donning/doffing sling. Patient and her daughter verbalized and demonstrated understanding as needed. Patient needed assistance to donn shirt, underwear, pants, socks and shoes, with instruction on compensatory strategies to perform ADLs. Patient to follow up with MD for further therapy needs.         If plan is discharge home, recommend the following: Help with stairs or ramp for entrance;Assistance with cooking/housework;Assist for transportation;A little help with bathing/dressing/bathroom    Functional Status Assessment  Patient has had a recent decline in their functional status and demonstrates the ability to make significant improvements in function in a reasonable and predictable amount of time.  Equipment Recommendations  None recommended by OT    Recommendations for Other Services       Precautions / Restrictions Precautions Precautions: Shoulder Type of Shoulder Precautions: Sling on at all times except ADLs/exercise, no shoulder ROM, okay to perform elbow, wrist, and hand ROM Shoulder Interventions: Shoulder sling/immobilizer Precaution Booklet Issued: Yes (comment) Required Braces or Orthoses: Sling Restrictions Weight Bearing Restrictions Per Provider Order: Yes LUE  Weight Bearing Per Provider Order: Non weight bearing      Mobility Bed Mobility      General bed mobility comments: pt was received seated in bedside chair    Transfers Overall transfer level: Needs assistance Equipment used: None Transfers: Sit to/from Stand Sit to Stand: Contact guard assist                  Balance Overall balance assessment: Mild deficits observed, not formally tested       ADL either performed or assessed with clinical judgement       Pertinent Vitals/Pain Pain Assessment Pain Assessment: No/denies pain     Extremity/Trunk Assessment Upper Extremity Assessment Upper Extremity Assessment: Right hand dominant           Communication Communication Communication: No apparent difficulties   Cognition Arousal: Alert Behavior During Therapy: WFL for tasks assessed/performed Overall Cognitive Status: Within Functional Limits for tasks assessed          General Comments: Oriented x4, able to follow commands without difficulty           Shoulder Instructions Shoulder Instructions Donning/doffing shirt without moving shoulder: Supervision/safety (Pt's daughter performed task, with OT providing verbal instruction on proper technique) Method for sponge bathing under operated UE: Caregiver independent with task Donning/doffing sling/immobilizer: Minimal assistance (Pt's daughter performed task, with OT providing instruction on proper technique and sequencing) Correct positioning of sling/immobilizer: Minimal assistance Pendulum exercises (written home exercise program):  (N/A) ROM for elbow, wrist and digits of operated UE: Patient able to independently direct caregiver;Caregiver independent with task Sling wearing schedule (on at all times/off for ADL's):  (Pt and her daughter required min cues to recall wear schedule) Proper positioning of operated UE when showering: Caregiver independent with task Dressing change: Caregiver independent  with task;Patient  able to independently direct caregiver Positioning of UE while sleeping: Caregiver independent with task;Patient able to independently direct caregiver    Home Living Family/patient expects to be discharged to:: Private residence Living Arrangements: Alone. Her daughter will be staying tonight and checking on her regularly as she lives nearby.  Available Help at Discharge: Family Type of Home: House Home Access: Stairs to enter [employer] of Steps: 2 Entrance Stairs-Rails: Left Home Layout: One level     Bathroom Shower/Tub: Walk-in shower         Home Equipment: [employer] (2 wheels);Shower seat;Grab bars - tub/shower (4 prong cane, walking stick)          Prior Functioning/Environment Prior Level of Function : Independent/Modified Independent;Driving             Mobility Comments: No AD used most of the time, however she reported use of cane when ambulating at night in the home. ADLs Comments: She was independent with ADLs and driving.        OT Problem List: Impaired UE functional use      OT Treatment/Interventions:   N/A   OT Goals(Current goals can be found in the care plan section) Acute Rehab OT Goals OT Goal Formulation: All assessment and education complete, DC therapy  OT Frequency:         AM-PAC OT 6 Clicks Daily Activity     Outcome Measure Help from another person eating meals?: None Help from another person taking care of personal grooming?: None Help from another person toileting, which includes using toliet, bedpan, or urinal?: A Little Help from another person bathing (including washing, rinsing, drying)?: A Little Help from another person to put on and taking off regular upper body clothing?: A Little Help from another person to put on and taking off regular lower body clothing?: A Little 6 Click Score: 20   End of Session Equipment Utilized During Treatment: Other (comment) (N/A) Nurse Communication:  Other (comment) (shoulder education completed)  Activity Tolerance: Patient tolerated treatment well Patient left: in chair;with call bell/phone within reach;with family/visitor present  OT Visit Diagnosis: Muscle weakness (generalized) (M62.81)                Time: 1330-1402 OT Time Calculation (min): 32 min Charges:  OT General Charges $OT Visit: 1 Visit OT Evaluation $OT Eval Moderate Complexity: 1 Mod OT Treatments $Self Care/Home Management : 8-22 mins    Delanna LITTIE Molt, OTR/L 10/14/2023, 4:00 PM

## 2023-10-14 NOTE — Op Note (Signed)
 NAMEOCEAN, KEARLEY MEDICAL RECORD NO: [id number] ACCOUNT NO: [id number] DATE OF BIRTH: [birth year] FACILITY: [facility] LOCATION: [facility] PHYSICIAN: Elspeth SAUNDERS. Kay, MD  Operative Report   DATE OF PROCEDURE: 10/14/2023  PREOPERATIVE DIAGNOSIS:  Left shoulder end-stage rotator cuff tear arthropathy.  POSTOPERATIVE DIAGNOSIS:  Left shoulder end-stage rotator cuff tear arthropathy.  PROCEDURE PERFORMED:  Left reverse total shoulder arthroplasty using DePuy Delta Xtend prosthesis with no subscapularis repair.  ATTENDING SURGEON:  Elspeth SAUNDERS. Kay, MD  ASSISTANT:  Debby Crock [place], [place], who was scrubbed during the entire procedure, and necessary for satisfactory completion of surgery.  ANESTHESIA:  General anesthesia was used plus interscalene block.  ESTIMATED BLOOD LOSS:  150 mL.  FLUID REPLACEMENT:  1000 mL crystalloid  COUNTS:  Instrument count was correct.  COMPLICATIONS:  There were no complications.  ANTIBIOTICS:  Perioperative antibiotics were given.  INDICATIONS:  The patient is an 83 year old female who presents with worsening left shoulder pain and dysfunction due to rotator cuff tear arthropathy.  The patient has failed conservative management and desires operative treatment to eliminate pain and  restore function.  Informed consent obtained.  DESCRIPTION OF PROCEDURE:  After an adequate level of general anesthesia was achieved and interscalene block, the patient was positioned in the modified beach chair position.  The left shoulder was correctly identified and sterile prep and drape  performed.  Timeout called verifying correct patient and correct site.  We entered the patient's shoulder using a standard deltopectoral incision starting at the coracoid process and extending down to the anterior humerus with a 10 blade scalpel.   Dissection down through the subcutaneous tissues using Bovie.  Cephalic vein was identified and taken laterally with the deltoid.  Pectoralis was  taken medially.  The conjoined tendon was identified and retracted medially.  Deep retractor was placed.  We  tenodesed the biceps in situ with 0 Vicryl figure-of-eight suture x2 incorporating part of the pectoralis tendon.  We then released the subscapularis, subperiosteal off the lesser tuberosity and tagged for protection of the axillary nerve and for  retraction.  We released the inferior capsule.  We extended the shoulder delivering the humeral head out of the wound.  The humeral head had advanced chondral loss with evident exposed bone.  We entered the proximal humerus with a 6-mm reamer reaming up  to a size 10.  We then placed our 10-mm T-handle guide intramedullary and then resected the head of 20 degrees of retroversion with the oscillating saw.  Next, we removed osteophytes with a rongeur.  We then subluxed the humerus posteriorly.  We had good  exposure of the glenoid face.  We removed the biceps stump, the labrum.  A large posterior glenoid osteophyte was removed and then with good exposure, we removed the remaining cartilage, which was anterior.  There was significant retroversion noted.  We  were able to ream the high side down after placing our guide pin centered low and inferiorly angled.  We reamed the subchondral bone anteriorly.  We were able to get about 80% of the base covered and prepared.  There was enough retroversion posteriorly  that we could not quite get the last posterior portion prepared sufficiently, but we were able to get the front part prepared well and superiorly and inferiorly for good baseplate stability.  Once we had that reaming done, we did peripheral hand reaming  with the T-handle reamer.  I drilled out the central peg hole on power.  We irrigated thoroughly and then impacted  the HA-coated base plate in position.  We had a 42 screw inferiorly, a 36 screw superiorly, both with good purchase.  We locked those to  the base plate.  We then placed a 38+0 standard  glenosphere onto the baseplate and secured that with a screwdriver without complication.  I was able to do a finger sweep around the glenosphere to make sure we had no soft tissue caught up in that  baseplate glenosphere bearing.  We then went to the humeral side reamed for the one left metaphysis.  We then trialed with the 10 stem and the one left metaphysis set in the 0 setting and placed in 20 degrees of retroversion, reduced with a 38+3 poly  trial.  I felt like probably the +6 would be our final.  We removed all trial components.  We irrigated thoroughly.  We then used available bone graft from the humeral head and with impaction grafting technique, impacted the HA-coated press-fit real size  10 stem with the one left metaphysis set in the 0 setting and impacted 20 degrees of retroversion.  With the stem secure, we selected the real 38+6 poly trial, reduced the shoulder.  We were pleased with that soft tissue tension and stability.  We  removed the poly trial and selected the real 38+6 polyethylene, impacted that on the humeral tray, and then reduced the shoulder.  The shoulder was very stable throughout a full arc of motion.  Appropriate tension on the conjoined not over tight.  We  irrigated thoroughly and then resected the subscapularis remnant with a Bovie and some remaining rotator cuff tissue, but there was quite a bit of good posterior cuff remaining.  We irrigated again and then repaired Delta pectoral interval with 0 Vicryl  suture followed by 2-0 Vicryl for subcutaneous closure and 4-0 Monocryl for skin.  Steri-Strips were applied followed by a sterile dressing.  The patient tolerated surgery well.    PUS D: 10/14/2023 11:28:49 am T: 10/14/2023 12:00:00 pm  JOB: [id number]/ [id number]

## 2023-10-17 ENCOUNTER — Encounter (HOSPITAL_COMMUNITY): Payer: Self-pay | Admitting: Orthopedic Surgery

## 2023-10-25 DIAGNOSIS — Z4789 Encounter for other orthopedic aftercare: Secondary | ICD-10-CM | POA: Diagnosis not present

## 2023-11-24 DIAGNOSIS — I872 Venous insufficiency (chronic) (peripheral): Secondary | ICD-10-CM | POA: Diagnosis not present

## 2023-11-24 DIAGNOSIS — Z4789 Encounter for other orthopedic aftercare: Secondary | ICD-10-CM | POA: Diagnosis not present

## 2023-11-24 DIAGNOSIS — Z85828 Personal history of other malignant neoplasm of skin: Secondary | ICD-10-CM | POA: Diagnosis not present

## 2023-11-24 DIAGNOSIS — I8311 Varicose veins of right lower extremity with inflammation: Secondary | ICD-10-CM | POA: Diagnosis not present

## 2023-11-24 DIAGNOSIS — D1801 Hemangioma of skin and subcutaneous tissue: Secondary | ICD-10-CM | POA: Diagnosis not present

## 2023-11-24 DIAGNOSIS — L821 Other seborrheic keratosis: Secondary | ICD-10-CM | POA: Diagnosis not present

## 2023-11-24 DIAGNOSIS — L570 Actinic keratosis: Secondary | ICD-10-CM | POA: Diagnosis not present

## 2023-11-24 DIAGNOSIS — L820 Inflamed seborrheic keratosis: Secondary | ICD-10-CM | POA: Diagnosis not present

## 2023-11-24 DIAGNOSIS — I8312 Varicose veins of left lower extremity with inflammation: Secondary | ICD-10-CM | POA: Diagnosis not present

## 2023-12-11 ENCOUNTER — Other Ambulatory Visit: Payer: Self-pay | Admitting: Family Medicine

## 2024-01-04 DIAGNOSIS — Z4789 Encounter for other orthopedic aftercare: Secondary | ICD-10-CM | POA: Diagnosis not present

## 2024-01-17 DIAGNOSIS — H401232 Low-tension glaucoma, bilateral, moderate stage: Secondary | ICD-10-CM | POA: Diagnosis not present

## 2024-01-30 ENCOUNTER — Ambulatory Visit: Payer: Self-pay

## 2024-01-30 NOTE — Telephone Encounter (Signed)
  Chief Complaint: L ankle swelling end of each day Symptoms: swelling Frequency: several weeks Pertinent Negatives: Patient denies long trips by plane/car, SOB, CP, swelling into the calf area, pain Disposition: [] ED /[] Urgent Care (no appt availability in office) / [x] Appointment(In office/virtual)/ []  [facility]/ [] Home Care/ [] Refused Recommended Disposition /[] [facility]/ []  Follow-up with PCP Additional Notes: Pt states that she has had ongoing L ankle swelling for several weeks, usually occurring in the evening. Pt states that she occasionally has R ankle swelling. Denies pain. Denies recent long trips, denies SOB. During triage pt states "I don't understand why you are asking me these questions, I just want an appointment to see Dr Megan Reilly". Explained new procedure with NT and asked if NT could continue to speak to her, pt agreeable. Triage completed, pt scheduled.  Copied from CRM [id number]. Topic: Clinical - Red Word Triage >> Jan 30, 2024  9:33 AM Orien Bird wrote: [facility] that prompted transfer to Nurse Triage: Swelling on her left ankle she stated it happens at night. Reason for Disposition  MILD or MODERATE ankle swelling (e.g., can't move joint normally, can't do usual activities) (Exceptions: Itchy, localized swelling; swelling is chronic.)  Answer Assessment - Initial Assessment Questions 1. LOCATION: "Which ankle is swollen?" "Where is the swelling?"     L ankle mostly, occasionally R side can swell too 2. ONSET: "When did the swelling start?"     Pt states several weeks 3. SWELLING: "How bad is the swelling?" Or, "How large is it?" (e.g., mild, moderate, severe; size of localized swelling)    - NONE: No joint swelling.   - LOCALIZED: Localized; small area of puffy or swollen skin (e.g., insect bite, skin irritation).   - MILD: Joint looks or feels mildly swollen or puffy.   - MODERATE: Swollen; interferes with normal activities (e.g., work or  school); decreased range of movement; may be limping.   - SEVERE: Very swollen; can't move swollen joint at all; limping a lot or unable to walk.     Localized to ankle 4. PAIN: "Is there any pain?" If Yes, ask: "How bad is it?" (Scale 1-10; or mild, moderate, severe)   - NONE (0): no pain.   - MILD (1-3): doesn't interfere with normal activities.    - MODERATE (4-7): interferes with normal activities (e.g., work or school) or awakens from sleep, limping.    - SEVERE (8-10): excruciating pain, unable to do any normal activities, unable to walk.      Denies pain 5. CAUSE: "What do you think caused the ankle swelling?"     Unsure  6. OTHER SYMPTOMS: "Do you have any other symptoms?" (e.g., fever, chest pain, difficulty breathing, calf pain)     denies  Protocols used: Ankle Swelling-A-AH

## 2024-02-01 ENCOUNTER — Ambulatory Visit (INDEPENDENT_AMBULATORY_CARE_PROVIDER_SITE_OTHER): Admitting: Family Medicine

## 2024-02-01 ENCOUNTER — Encounter: Payer: Self-pay | Admitting: Family Medicine

## 2024-02-01 VITALS — BP 126/66 | HR 69 | Temp 98.3°F | Wt 157.9 lb

## 2024-02-01 DIAGNOSIS — M25473 Effusion, unspecified ankle: Secondary | ICD-10-CM | POA: Diagnosis not present

## 2024-02-01 DIAGNOSIS — I10 Essential (primary) hypertension: Secondary | ICD-10-CM | POA: Diagnosis not present

## 2024-02-01 NOTE — Progress Notes (Signed)
 Established Patient Office Visit  Subjective   Patient ID: Megan Reilly, female    DOB: [birth year]  Age: 83 y.o. MRN: [id number]  Chief Complaint  Patient presents with   Foot Swelling    Patient complains of left ankle swelling,    HPI   Megan Reilly is seen with some mild left ankle swelling which her family noted recently with family get together.  She noticed little bit of swelling mostly left lateral ankle especially late in the day.  Tends to go down with elevation and is resolved early morning.  No leg or calf edema.  No recent injury.  No pain with ambulation.  Does take amlodipine  5 mg daily but has not noted any right ankle edema.  No dyspnea.  Generally feels well.  Past Medical History:  Diagnosis Date   Allergy    Arthritis    Blood transfusion abn reaction or complication, no procedure mishap    83 years old after tonsilectomy   Hypertension    Osteoporosis 04/2019   T score -2.1 improved from prior DEXA   Tibia fracture    left   Vasovagal syncope    2 prior incidences last was in 2015 ; treated in ED , reports no recurrence since then    Past Surgical History:  Procedure Laterality Date   COLONOSCOPY     FOOT SURGERY     bunectomy, hammertoe, bil   LAPAROSCOPIC BILATERAL SALPINGO OOPHERECTOMY     REVERSE SHOULDER ARTHROPLASTY Left 10/14/2023   Procedure: REVERSE SHOULDER ARTHROPLASTY;  Surgeon: Winston Hawking, MD;  Location: [facility];  Service: Orthopedics;  Laterality: Left;    TONSILLECTOMY  10/12/1943   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   Procedure: RIGHT TOTAL KNEE ARTHROPLASTY;  Surgeon: Liliane Rei, MD;  Location: [facility];  Service: Orthopedics;  Laterality: Right;   TOTAL KNEE ARTHROPLASTY Left 08/09/2022   Procedure: TOTAL KNEE ARTHROPLASTY;  Surgeon: Liliane Rei, MD;  Location: [facility];  Service: Orthopedics;  Laterality: Left;    reports that she has never smoked. She has never been exposed to tobacco smoke. She has never used  smokeless tobacco. She reports current alcohol  use. She reports that she does not use drugs. family history includes Breast cancer in her maternal aunt; Diabetes in her father; Heart disease in her mother; Heart disease (age of onset: 34) in her father; Heart failure in her mother; Hyperlipidemia in her father and mother; Stroke in her mother. Allergies  Allergen Reactions   Benadryl  [Diphenhydramine ] Other (See Comments)    Light headed   Hctz [Hydrochlorothiazide ] Other (See Comments)    Fatigue, body aches    Review of Systems  Constitutional:  Negative for malaise/fatigue.  Eyes:  Negative for blurred vision.  Respiratory:  Negative for shortness of breath.   Cardiovascular:  Negative for chest pain.  Neurological:  Negative for dizziness, weakness and headaches.      Objective:     BP 126/66 (BP Location: Left Arm, Patient Position: Sitting, Cuff Size: Normal)   Pulse 69   Temp 98.3 F (36.8 C) (Oral)   Wt 157 lb 14.4 oz (71.6 kg)   SpO2 96%   BMI 24.01 kg/m  BP Readings from Last 3 Encounters:  02/01/24 126/66  10/14/23 (!) 126/57  10/03/23 134/69   Wt Readings from Last 3 Encounters:  02/01/24 157 lb 14.4 oz (71.6 kg)  10/14/23 159 lb (72.1 kg)  10/03/23 159 lb (72.1 kg)      Physical Exam  Vitals reviewed.  Constitutional:      General: She is not in acute distress.    Appearance: She is not ill-appearing.  Cardiovascular:     Rate and Rhythm: Normal rate and regular rhythm.  Pulmonary:     Effort: Pulmonary effort is normal.     Breath sounds: Normal breath sounds. No wheezing or rales.  Musculoskeletal:     Comments: Left ankle reveals perhaps very mild edema laterally.  No localized tenderness.  No warmth.  No erythema.  No ecchymosis.  No pitting edema.  No evidence for any calf or leg edema or obvious foot edema.  She has some varicosities bilaterally.  Both feet are warm to touch with good dorsalis pedis and posterior tibial pulses.  Excellent  capillary refill throughout.  Neurological:     Mental Status: She is alert.      No results found for any visits on 02/01/24.    The ASCVD Risk score (Arnett DK, et al., 2019) failed to calculate for the following reasons:   The 2019 ASCVD risk score is only valid for ages 51 to 40    Assessment & Plan:   Very mild asymmetric edema.  Patient on amlodipine .  Suspect some mild venous stasis.  No worrisome findings on exam or by history.  Elevate legs frequently.  We discussed compression but these are uncomfortable for her to put on and get off.   Glean Lamy, MD

## 2024-03-03 ENCOUNTER — Encounter (HOSPITAL_COMMUNITY): Payer: Self-pay | Admitting: Internal Medicine

## 2024-03-03 ENCOUNTER — Emergency Department (HOSPITAL_COMMUNITY)

## 2024-03-03 ENCOUNTER — Inpatient Hospital Stay (HOSPITAL_COMMUNITY)
Admission: EM | Admit: 2024-03-03 | Discharge: 2024-03-08 | DRG: 522 | Disposition: A | Discharge status: Skilled Nursing Facility | Attending: Internal Medicine | Admitting: Internal Medicine

## 2024-03-03 ENCOUNTER — Other Ambulatory Visit: Payer: Self-pay | Admitting: Family Medicine

## 2024-03-03 DIAGNOSIS — D649 Anemia, unspecified: Secondary | ICD-10-CM | POA: Diagnosis not present

## 2024-03-03 DIAGNOSIS — S52135D Nondisplaced fracture of neck of left radius, subsequent encounter for closed fracture with routine healing: Secondary | ICD-10-CM | POA: Diagnosis not present

## 2024-03-03 DIAGNOSIS — S0990XA Unspecified injury of head, initial encounter: Secondary | ICD-10-CM | POA: Diagnosis not present

## 2024-03-03 DIAGNOSIS — Z743 Need for continuous supervision: Secondary | ICD-10-CM | POA: Diagnosis not present

## 2024-03-03 DIAGNOSIS — W19XXXA Unspecified fall, initial encounter: Secondary | ICD-10-CM | POA: Diagnosis not present

## 2024-03-03 DIAGNOSIS — S52122A Displaced fracture of head of left radius, initial encounter for closed fracture: Secondary | ICD-10-CM | POA: Diagnosis not present

## 2024-03-03 DIAGNOSIS — Z96612 Presence of left artificial shoulder joint: Secondary | ICD-10-CM | POA: Diagnosis not present

## 2024-03-03 DIAGNOSIS — S52135A Nondisplaced fracture of neck of left radius, initial encounter for closed fracture: Secondary | ICD-10-CM | POA: Diagnosis not present

## 2024-03-03 DIAGNOSIS — Z823 Family history of stroke: Secondary | ICD-10-CM

## 2024-03-03 DIAGNOSIS — S59902A Unspecified injury of left elbow, initial encounter: Secondary | ICD-10-CM | POA: Diagnosis not present

## 2024-03-03 DIAGNOSIS — S72002A Fracture of unspecified part of neck of left femur, initial encounter for closed fracture: Secondary | ICD-10-CM | POA: Diagnosis present

## 2024-03-03 DIAGNOSIS — D62 Acute posthemorrhagic anemia: Secondary | ICD-10-CM | POA: Diagnosis not present

## 2024-03-03 DIAGNOSIS — Z79899 Other long term (current) drug therapy: Secondary | ICD-10-CM

## 2024-03-03 DIAGNOSIS — S52136A Nondisplaced fracture of neck of unspecified radius, initial encounter for closed fracture: Secondary | ICD-10-CM | POA: Diagnosis present

## 2024-03-03 DIAGNOSIS — S42402A Unspecified fracture of lower end of left humerus, initial encounter for closed fracture: Secondary | ICD-10-CM

## 2024-03-03 DIAGNOSIS — S72002P Fracture of unspecified part of neck of left femur, subsequent encounter for closed fracture with malunion: Secondary | ICD-10-CM | POA: Diagnosis not present

## 2024-03-03 DIAGNOSIS — H409 Unspecified glaucoma: Secondary | ICD-10-CM | POA: Diagnosis not present

## 2024-03-03 DIAGNOSIS — Z803 Family history of malignant neoplasm of breast: Secondary | ICD-10-CM

## 2024-03-03 DIAGNOSIS — I10 Essential (primary) hypertension: Secondary | ICD-10-CM | POA: Diagnosis present

## 2024-03-03 DIAGNOSIS — S72142D Displaced intertrochanteric fracture of left femur, subsequent encounter for closed fracture with routine healing: Secondary | ICD-10-CM | POA: Diagnosis not present

## 2024-03-03 DIAGNOSIS — R278 Other lack of coordination: Secondary | ICD-10-CM | POA: Diagnosis not present

## 2024-03-03 DIAGNOSIS — M1712 Unilateral primary osteoarthritis, left knee: Secondary | ICD-10-CM | POA: Diagnosis not present

## 2024-03-03 DIAGNOSIS — S52032A Displaced fracture of olecranon process with intraarticular extension of left ulna, initial encounter for closed fracture: Secondary | ICD-10-CM | POA: Diagnosis not present

## 2024-03-03 DIAGNOSIS — S59919A Unspecified injury of unspecified forearm, initial encounter: Secondary | ICD-10-CM | POA: Diagnosis not present

## 2024-03-03 DIAGNOSIS — D429 Neoplasm of uncertain behavior of meninges, unspecified: Secondary | ICD-10-CM | POA: Diagnosis not present

## 2024-03-03 DIAGNOSIS — Z471 Aftercare following joint replacement surgery: Secondary | ICD-10-CM | POA: Diagnosis not present

## 2024-03-03 DIAGNOSIS — M25422 Effusion, left elbow: Secondary | ICD-10-CM | POA: Diagnosis not present

## 2024-03-03 DIAGNOSIS — Z7401 Bed confinement status: Secondary | ICD-10-CM | POA: Diagnosis not present

## 2024-03-03 DIAGNOSIS — Z96653 Presence of artificial knee joint, bilateral: Secondary | ICD-10-CM | POA: Diagnosis present

## 2024-03-03 DIAGNOSIS — M160 Bilateral primary osteoarthritis of hip: Secondary | ICD-10-CM | POA: Diagnosis not present

## 2024-03-03 DIAGNOSIS — S52022A Displaced fracture of olecranon process without intraarticular extension of left ulna, initial encounter for closed fracture: Secondary | ICD-10-CM | POA: Diagnosis present

## 2024-03-03 DIAGNOSIS — Z8249 Family history of ischemic heart disease and other diseases of the circulatory system: Secondary | ICD-10-CM

## 2024-03-03 DIAGNOSIS — I959 Hypotension, unspecified: Secondary | ICD-10-CM | POA: Diagnosis present

## 2024-03-03 DIAGNOSIS — S72009A Fracture of unspecified part of neck of unspecified femur, initial encounter for closed fracture: Secondary | ICD-10-CM | POA: Diagnosis not present

## 2024-03-03 DIAGNOSIS — M810 Age-related osteoporosis without current pathological fracture: Secondary | ICD-10-CM | POA: Diagnosis not present

## 2024-03-03 DIAGNOSIS — Z833 Family history of diabetes mellitus: Secondary | ICD-10-CM | POA: Diagnosis not present

## 2024-03-03 DIAGNOSIS — S52125A Nondisplaced fracture of head of left radius, initial encounter for closed fracture: Secondary | ICD-10-CM | POA: Diagnosis not present

## 2024-03-03 DIAGNOSIS — M6281 Muscle weakness (generalized): Secondary | ICD-10-CM | POA: Diagnosis not present

## 2024-03-03 DIAGNOSIS — S52133A Displaced fracture of neck of unspecified radius, initial encounter for closed fracture: Secondary | ICD-10-CM | POA: Insufficient documentation

## 2024-03-03 DIAGNOSIS — Z96642 Presence of left artificial hip joint: Secondary | ICD-10-CM | POA: Diagnosis not present

## 2024-03-03 DIAGNOSIS — R55 Syncope and collapse: Secondary | ICD-10-CM | POA: Diagnosis not present

## 2024-03-03 DIAGNOSIS — R2681 Unsteadiness on feet: Secondary | ICD-10-CM | POA: Diagnosis not present

## 2024-03-03 DIAGNOSIS — Z83438 Family history of other disorder of lipoprotein metabolism and other lipidemia: Secondary | ICD-10-CM | POA: Diagnosis not present

## 2024-03-03 DIAGNOSIS — W19XXXD Unspecified fall, subsequent encounter: Secondary | ICD-10-CM | POA: Diagnosis not present

## 2024-03-03 LAB — CBC WITH DIFFERENTIAL/PLATELET
Abs Immature Granulocytes: 0.08 10*3/uL — ABNORMAL HIGH (ref 0.00–0.07)
Basophils Absolute: 0.1 10*3/uL (ref 0.0–0.1)
Basophils Relative: 1 %
Eosinophils Absolute: 0.1 10*3/uL (ref 0.0–0.5)
Eosinophils Relative: 1 %
HCT: 41.7 % (ref 36.0–46.0)
Hemoglobin: 13.8 g/dL (ref 12.0–15.0)
Immature Granulocytes: 1 %
Lymphocytes Relative: 14 %
Lymphs Abs: 1.7 10*3/uL (ref 0.7–4.0)
MCH: 31.7 pg (ref 26.0–34.0)
MCHC: 33.1 g/dL (ref 30.0–36.0)
MCV: 95.6 fL (ref 80.0–100.0)
Monocytes Absolute: 0.8 10*3/uL (ref 0.1–1.0)
Monocytes Relative: 7 %
Neutro Abs: 9.7 10*3/uL — ABNORMAL HIGH (ref 1.7–7.7)
Neutrophils Relative %: 76 %
Platelets: 168 10*3/uL (ref 150–400)
RBC: 4.36 MIL/uL (ref 3.87–5.11)
RDW: 12.6 % (ref 11.5–15.5)
WBC: 12.4 10*3/uL — ABNORMAL HIGH (ref 4.0–10.5)
nRBC: 0 % (ref 0.0–0.2)

## 2024-03-03 LAB — COMPREHENSIVE METABOLIC PANEL WITH GFR
ALT: 20 U/L (ref 0–44)
AST: 31 U/L (ref 15–41)
Albumin: 4.2 g/dL (ref 3.5–5.0)
Alkaline Phosphatase: 59 U/L (ref 38–126)
Anion gap: 14 (ref 5–15)
BUN: 20 mg/dL (ref 8–23)
CO2: 20 mmol/L — ABNORMAL LOW (ref 22–32)
Calcium: 10.8 mg/dL — ABNORMAL HIGH (ref 8.9–10.3)
Chloride: 104 mmol/L (ref 98–111)
Creatinine, Ser: 1.02 mg/dL — ABNORMAL HIGH (ref 0.44–1.00)
GFR, Estimated: 55 mL/min — ABNORMAL LOW (ref >60)
Glucose, Bld: 114 mg/dL — ABNORMAL HIGH (ref 70–99)
Potassium: 4.2 mmol/L (ref 3.5–5.1)
Sodium: 138 mmol/L (ref 135–145)
Total Bilirubin: 0.7 mg/dL (ref 0.0–1.2)
Total Protein: 6.4 g/dL — ABNORMAL LOW (ref 6.5–8.1)

## 2024-03-03 MED ORDER — FENTANYL CITRATE PF 50 MCG/ML IJ SOSY
50.0000 ug | PREFILLED_SYRINGE | Freq: Once | INTRAMUSCULAR | Status: AC
  Administered 2024-03-03: 50 ug via INTRAVENOUS
  Filled 2024-03-03: qty 1

## 2024-03-03 MED ORDER — ONDANSETRON HCL 4 MG/2ML IJ SOLN
4.0000 mg | Freq: Four times a day (QID) | INTRAMUSCULAR | Status: DC | PRN
  Administered 2024-03-03: 4 mg via INTRAVENOUS
  Filled 2024-03-03: qty 2

## 2024-03-03 MED ORDER — POLYETHYLENE GLYCOL 3350 17 G PO PACK
17.0000 g | PACK | Freq: Two times a day (BID) | ORAL | Status: AC
  Administered 2024-03-04 – 2024-03-05 (×2): 17 g via ORAL
  Filled 2024-03-03 (×2): qty 1

## 2024-03-03 MED ORDER — ACETAMINOPHEN 500 MG PO TABS
500.0000 mg | ORAL_TABLET | Freq: Four times a day (QID) | ORAL | Status: DC | PRN
  Administered 2024-03-03: 1000 mg via ORAL
  Filled 2024-03-03: qty 2

## 2024-03-03 MED ORDER — MELATONIN 3 MG PO TABS: 3.0000 mg | ORAL_TABLET | Freq: Every evening | ORAL | Status: DC | PRN

## 2024-03-03 MED ORDER — CALCIUM CARBONATE 600 MG PO TABS: 1500.0000 mg | ORAL_TABLET | ORAL | Status: DC

## 2024-03-03 MED ORDER — MAGNESIUM 400 MG PO CAPS: 400.0000 mg | ORAL_CAPSULE | Freq: Every day | ORAL | Status: DC

## 2024-03-03 MED ORDER — OXYCODONE-ACETAMINOPHEN 5-325 MG PO TABS: 1.0000 | ORAL_TABLET | Freq: Once | ORAL | Status: DC

## 2024-03-03 MED ORDER — VITAMIN D 25 MCG (1000 UNIT) PO TABS
2000.0000 [IU] | ORAL_TABLET | Freq: Every day | ORAL | Status: DC
  Administered 2024-03-05 – 2024-03-08 (×4): 2000 [IU] via ORAL
  Filled 2024-03-03 (×10): qty 2

## 2024-03-03 MED ORDER — MAGNESIUM OXIDE -MG SUPPLEMENT 400 (240 MG) MG PO TABS
400.0000 mg | ORAL_TABLET | Freq: Every day | ORAL | Status: DC
  Administered 2024-03-05 – 2024-03-08 (×4): 400 mg via ORAL
  Filled 2024-03-03 (×5): qty 1

## 2024-03-03 MED ORDER — MORPHINE SULFATE (PF) 2 MG/ML IV SOLN
2.0000 mg | INTRAVENOUS | Status: DC | PRN
  Administered 2024-03-03 – 2024-03-05 (×4): 2 mg via INTRAVENOUS
  Filled 2024-03-03 (×4): qty 1

## 2024-03-03 MED ORDER — OYSTER SHELL CALCIUM/D3 500-5 MG-MCG PO TABS
0.5000 | ORAL_TABLET | Freq: Every day | ORAL | Status: DC
  Administered 2024-03-06 – 2024-03-08 (×3): 0.5 via ORAL
  Filled 2024-03-03 (×4): qty 1

## 2024-03-03 MED ORDER — LATANOPROST 0.005 % OP SOLN
1.0000 [drp] | Freq: Every day | OPHTHALMIC | Status: AC
  Filled 2024-03-03: qty 2.5

## 2024-03-03 MED ORDER — TRAMADOL HCL 50 MG PO TABS
50.0000 mg | ORAL_TABLET | Freq: Four times a day (QID) | ORAL | Status: DC | PRN
  Administered 2024-03-03 – 2024-03-04 (×2): 50 mg via ORAL
  Filled 2024-03-03 (×2): qty 1

## 2024-03-03 MED ORDER — ONDANSETRON HCL 4 MG PO TABS: 4.0000 mg | ORAL_TABLET | Freq: Four times a day (QID) | ORAL | Status: DC | PRN

## 2024-03-03 MED ORDER — ENOXAPARIN SODIUM 40 MG/0.4ML IJ SOSY
40.0000 mg | PREFILLED_SYRINGE | Freq: Every day | INTRAMUSCULAR | Status: DC
  Administered 2024-03-05: 40 mg via SUBCUTANEOUS
  Filled 2024-03-03: qty 0.4

## 2024-03-03 MED ORDER — METHOCARBAMOL 1000 MG/10ML IJ SOLN: 500.0000 mg | Freq: Three times a day (TID) | INTRAMUSCULAR | Status: DC | PRN

## 2024-03-03 NOTE — ED Notes (Signed)
[facility] called for transfer to [facility]

## 2024-03-03 NOTE — ED Provider Notes (Signed)
 [place] EMERGENCY DEPARTMENT AT [facility] Provider Note   CSN: [id number] Arrival date & time: 03/03/24  1224     History  Chief Complaint  Patient presents with   [facility] [place] is a 83 y.o. female.  Patient to ED with left knee, hip and elbow pain after fall earlier today. She states she was walking and felt her left leg give out, causing her to fall, landing on her left side. She hit her head but did not pass out and is not anticoagulated. She denies dizziness or chest pain. She did not pass out before, during or after the fall.   The history is provided by the patient. No language interpreter was used.  Fall       Home Medications Prior to Admission medications   Medication Sig Start Date End Date Taking? Authorizing Provider  acetaminophen  (TYLENOL ) 500 MG tablet Take 500-1,000 mg by mouth every 6 (six) hours as needed for mild pain (pain score 1-3) or headache.    [provider]  amLODipine  (NORVASC ) 5 MG tablet TAKE 1 TABLET (5 MG TOTAL) BY MOUTH DAILY. 12/12/23   Burchette, Marijean Shouts, MD  calcium carbonate (OS-CAL) 600 MG TABS Take 300 mg by mouth every other day.    [provider]  cetirizine (ZYRTEC) 10 MG tablet Take 10 mg by mouth daily as needed for allergies.    [provider]  Cholecalciferol (VITAMIN D -3 PO) Take 2,000 Units by mouth daily.    [provider]  fish oil-omega-3 fatty acids 1000 MG capsule Take 1 g by mouth daily.     [provider]  fluticasone  (FLONASE ) 50 MCG/ACT nasal spray SPRAY 1 TO 2 SPARYS IN EACH NOSTRIL DAILY. Patient taking differently: Place 1 spray into both nostrils daily as needed for allergies or rhinitis. 02/07/23   Burchette, Marijean Shouts, MD  ibuprofen  (ADVIL ) 200 MG tablet Take 200-400 mg by mouth every 6 (six) hours as needed for mild pain (pain score 1-3).    [provider]  latanoprost  (XALATAN ) 0.005 % ophthalmic solution Place 1 drop into both eyes  at bedtime. 04/14/22   [provider]  losartan  (COZAAR ) 50 MG tablet TAKE 1 TABLET BY MOUTH EVERY DAY 09/12/23   Burchette, Marijean Shouts, MD  Magnesium 400 MG CAPS Take 400 mg by mouth daily. Triple    [provider]  Melatonin 3 MG TABS Take 3 mg by mouth. 3-4 times a week    [provider]  Multiple Vitamin (MULTIVITAMIN WITH MINERALS) TABS tablet Take 1 tablet by mouth every morning.    [provider]  MYRBETRIQ  50 MG TB24 tablet TAKE 1 TABLET BY MOUTH EVERY DAY 09/12/23   Burchette, Marijean Shouts, MD  traMADol  (ULTRAM ) 50 MG tablet Take 1 tablet (50 mg total) by mouth every 6 (six) hours as needed for severe pain (pain score 7-10). 10/14/23 10/13/24  Winston Hawking, MD      Allergies    Benadryl  [diphenhydramine ] and Hctz [hydrochlorothiazide ]    Review of Systems   Review of Systems  Physical Exam Updated Vital Signs BP (!) 161/86   Pulse 74   Temp 97.7 F (36.5 C) (Oral)   Resp 20   SpO2 100%  Physical Exam Vitals and nursing note reviewed.  Constitutional:      Appearance: She is well-developed.  HENT:     Head: Normocephalic.  Cardiovascular:     Rate and Rhythm: Normal rate and  regular rhythm.     Pulses: Normal pulses.     Heart sounds: No murmur heard. Pulmonary:     Effort: Pulmonary effort is normal.     Breath sounds: Normal breath sounds. No wheezing, rhonchi or rales.  Abdominal:     General: Bowel sounds are normal.     Palpations: Abdomen is soft.     Tenderness: There is no abdominal tenderness. There is no guarding or rebound.  Musculoskeletal:        General: Normal range of motion.     Cervical back: Normal range of motion and neck supple.     Comments: Left LE shortened and externally rotated. Tender most over the hip. Knee without deformity and minimally tender. The left elbow is markedly swollen. Limited flexion to 90 degrees, better on extension. Neurovascularly intact. Distal pulses brisk.  Skin:    General: Skin is warm and  dry.     Findings: No lesion.  Neurological:     General: No focal deficit present.     Mental Status: She is alert and oriented to person, place, and time.     ED Results / Procedures / Treatments   Labs (all labs ordered are listed, but only abnormal results are displayed) Labs Reviewed  CBC WITH DIFFERENTIAL/PLATELET - Abnormal; Notable for the following components:      Result Value   WBC 12.4 (*)    Neutro Abs 9.7 (*)    Abs Immature Granulocytes 0.08 (*)    All other components within normal limits  COMPREHENSIVE METABOLIC PANEL WITH GFR - Abnormal; Notable for the following components:   CO2 20 (*)    Glucose, Bld 114 (*)    Creatinine, Ser 1.02 (*)    Calcium 10.8 (*)    Total Protein 6.4 (*)    GFR, Estimated 55 (*)    All other components within normal limits  URINALYSIS, ROUTINE W REFLEX MICROSCOPIC   Results for orders placed or performed during the hospital encounter of 03/03/24  CBC with Differential   Collection Time: 03/03/24  1:18 PM  Result Value Ref Range   WBC 12.4 (H) 4.0 - 10.5 K/uL   RBC 4.36 3.87 - 5.11 MIL/uL   Hemoglobin 13.8 12.0 - 15.0 g/dL   HCT 16.1 09.6 - 04.5 %   MCV 95.6 80.0 - 100.0 fL   MCH 31.7 26.0 - 34.0 pg   MCHC 33.1 30.0 - 36.0 g/dL   RDW 40.9 81.1 - 91.4 %   Platelets 168 150 - 400 K/uL   nRBC 0.0 0.0 - 0.2 %   Neutrophils Relative % 76 %   Neutro Abs 9.7 (H) 1.7 - 7.7 K/uL   Lymphocytes Relative 14 %   Lymphs Abs 1.7 0.7 - 4.0 K/uL   Monocytes Relative 7 %   Monocytes Absolute 0.8 0.1 - 1.0 K/uL   Eosinophils Relative 1 %   Eosinophils Absolute 0.1 0.0 - 0.5 K/uL   Basophils Relative 1 %   Basophils Absolute 0.1 0.0 - 0.1 K/uL   Immature Granulocytes 1 %   Abs Immature Granulocytes 0.08 (H) 0.00 - 0.07 K/uL  Comprehensive metabolic panel   Collection Time: 03/03/24  1:18 PM  Result Value Ref Range   Sodium 138 135 - 145 mmol/L   Potassium 4.2 3.5 - 5.1 mmol/L   Chloride 104 98 - 111 mmol/L   CO2 20 (L) 22 - 32  mmol/L   Glucose, Bld 114 (H) 70 - 99 mg/dL  BUN 20 8 - 23 mg/dL   Creatinine, Ser 0.98 (H) 0.44 - 1.00 mg/dL   Calcium 11.9 (H) 8.9 - 10.3 mg/dL   Total Protein 6.4 (L) 6.5 - 8.1 g/dL   Albumin 4.2 3.5 - 5.0 g/dL   AST 31 15 - 41 U/L   ALT 20 0 - 44 U/L   Alkaline Phosphatase 59 38 - 126 U/L   Total Bilirubin 0.7 0.0 - 1.2 mg/dL   GFR, Estimated 55 (L) >60 mL/min   Anion gap 14 5 - 15    EKG None  Radiology DG Femur Min 2 Views Left Result Date: 03/03/2024 CLINICAL DATA:  Left hip pain after fall. EXAM: LEFT FEMUR 2 VIEWS; PELVIS - 1-2 VIEW COMPARISON:  None Available. FINDINGS: Femur: Displaced femoral neck fracture. There is proximal migration of the femoral shaft with apex lateral angulation. The distal femur is intact. Previous knee arthroplasty. No focal bone lesion or erosive change. Pelvis: No additional fracture of the pelvis. Pubic rami are intact. Pubic symphysis and sacroiliac joints are congruent. Milder underlying bilateral hip osteoarthritis. IMPRESSION: Displaced left femoral neck fracture. Electronically Signed   By: Chadwick Colonel M.D.   On: 03/03/2024 14:27   DG Pelvis 1-2 Views Result Date: 03/03/2024 CLINICAL DATA:  Left hip pain after fall. EXAM: LEFT FEMUR 2 VIEWS; PELVIS - 1-2 VIEW COMPARISON:  None Available. FINDINGS: Femur: Displaced femoral neck fracture. There is proximal migration of the femoral shaft with apex lateral angulation. The distal femur is intact. Previous knee arthroplasty. No focal bone lesion or erosive change. Pelvis: No additional fracture of the pelvis. Pubic rami are intact. Pubic symphysis and sacroiliac joints are congruent. Milder underlying bilateral hip osteoarthritis. IMPRESSION: Displaced left femoral neck fracture. Electronically Signed   By: Chadwick Colonel M.D.   On: 03/03/2024 14:27   DG Elbow Complete Left Result Date: 03/03/2024 CLINICAL DATA:  Pain after fall.  Bruising and swelling. EXAM: LEFT ELBOW - COMPLETE 3+ VIEW  COMPARISON:  None Available. FINDINGS: Comminuted and displaced olecranon fracture. There is osseous distraction of at least 14 mm at the articular surface. Potential nondisplaced fracture of the radial neck. Mild degenerative spurring. There is a moderate joint effusion. Posterior soft tissue edema. IMPRESSION: 1. Comminuted and displaced olecranon fracture with osseous distraction of at least 14 mm at the articular surface. 2. Potential nondisplaced fracture of the radial neck. Electronically Signed   By: Chadwick Colonel M.D.   On: 03/03/2024 14:26   CT Cervical Spine Wo Contrast Result Date: 03/03/2024 EXAM: CT CERVICAL SPINE WITHOUT CONTRAST 03/03/2024 01:51:43 PM TECHNIQUE: CT of the cervical was performed without the administration of intravenous contrast. Multiplanar reformatted images are provided for review. Automated exposure control, iterative reconstruction, and/or weight based adjustment of the mA/kV was utilized to reduce the radiation dose to as low as reasonably achievable. COMPARISON: None available. CLINICAL HISTORY: Neck trauma (Age >= 65y).  Fall. FINDINGS: CERVICAL SPINE: BONES/ALIGNMENT: Chronic changes at the dens suggest rheumatoid arthritis. Grade 1 degenerative anterolisthesis is present at C7-T1. Skin degenerative anterolisthesis is present at C3-4. DEGENERATIVE CHANGES: Chronic loss of disc height and uncovertebral spine is present at C4-5, C5-6, and C6-7. SOFT TISSUES: There is no prevertebral soft tissue swelling. IMPRESSION: 1. No acute abnormality of the cervical spine related to the reported neck trauma. 2. 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 Electronically signed by: Audree Leas MD 03/03/2024 02:19 PM EDT RP Workstation: [id number]   CT Head Wo Contrast Result Date: 03/03/2024 EXAM: CT  HEAD WITHOUT 03/03/2024 01:51:43 PM TECHNIQUE: CT of the head was performed without the administration of intravenous contrast. Automated exposure control,  iterative reconstruction, and/or weight based adjustment of the mA/kV was utilized to reduce the radiation dose to as low as reasonably achievable. COMPARISON: MR head without and with contrast 05/18/2023. CLINICAL HISTORY: Head trauma, minor (Age >= 65y). Non con. Fall. FINDINGS: BRAIN AND VENTRICLES: There is no acute intracranial hemorrhage, mass effect or midline shift. No abnormal extra-axial fluid collection. The gray-white differentiation is maintained without evidence of an acute infarct. The ventricles are proportionate to the degree of atrophy. Paraventricular and subcortical white matter hypoattenuation is moderately advanced for age, stable. ORBITS: Bilateral lens replacements are present. The globes and orbits are otherwise within normal limits. SINUSES: The visualized paranasal sinuses and mastoid air cells demonstrate no acute abnormality. 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 A second meningioma measuring 6 mm. No acute abnormality of the visualized skull or soft tissues. VASCULATURE: Atherosclerotic calcifications are present in the cavernous carotid arteries bilaterally and at the dural margin of both vertebral arteries. No hyperdense vessel is present. IMPRESSION: 1. No acute intracranial abnormality related to the minor head trauma. 2. 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 3. Moderately advanced paraventricular and subcortical white matter hypoattenuation for age, stable. 4. Atherosclerosis. Electronically signed by: Audree Leas MD 03/03/2024 02:16 PM EDT RP Workstation: [id number]    Procedures Procedures    Medications Ordered in ED Medications  fentaNYL  (SUBLIMAZE ) injection 50 mcg (50 mcg Intravenous Given 03/03/24 1424)    ED Course/ Medical Decision Making/ A&P                                 Medical Decision  Making This patient presents to the ED for concern of hip pain, this involves an extensive number of treatment options, and is a complaint that carries with it a high risk of complications and morbidity.  The differential diagnosis includes hip fracture, pelvic fracture, lumbar fracture   Co morbidities that complicate the patient evaluation  Arthritis, osteoporosis, vasovagal episode, HTN   Additional history obtained:  Additional history and/or information obtained from chart review, notable for: Has been seen by Dr. France Ina in the past.    Lab Tests:  I Ordered, and personally interpreted labs.  The pertinent results include:   CBC - WBC 12.4, normal hgb, normal plts    Imaging Studies ordered:  I ordered imaging studies including hip, knee, pelvis, elbow Per radiologist interpretation: Elbow:  IMPRESSION: 1. Comminuted and displaced olecranon fracture with osseous distraction of at least 14 mm at the articular surface. 2. Potential nondisplaced fracture of the radial neck.   Femur (eval knee femur and pelvis)  IMPRESSION: Displaced left femoral neck fracture.  Head and c spine CT:  IMPRESSION: 1. No acute abnormality of the cervical spine related to the reported neck trauma. 2. 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   IMPRESSION: 1. No acute intracranial abnormality related to the minor head trauma. 2. 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 3. Moderately advanced paraventricular and subcortical white matter hypoattenuation for age, stable. 4. Atherosclerosis.     Cardiac Monitoring:  The patient  was maintained on a cardiac monitor.  I personally viewed and interpreted the cardiac monitored which showed an underlying rhythm of: pending   Medicines ordered and prescription drug management:  I ordered medication including Fentanyl   for pain Reevaluation of the patient  after these medicines showed that the patient improved I have reviewed the patients home medicines and have made adjustments as needed   Test Considered:  N/a   Critical Interventions:  N/a   Consultations Obtained:  I requested consultation with the ortho, Dr. Hiram Lukes,  and discussed lab and imaging findings as well as pertinent plan - they recommend: admit to [facility] - procedure likely Monday.   Problem List / ED Course:  Fall, left side pain (hip, knee, elbow) Concerned for recurrent vasovagal episode as she reports EMS found her to by hypotensive - however, no chest pain or other pre-fall symptoms, no syncope Fractures found to left femoral neck and left elbow. Discussed with orthopedics with plan in place Discussed with hospitalist who accepts for admission.   Reevaluation:  After the interventions noted above, I reevaluated the patient and found that they have :improved   Social Determinants of Health:  Independent 83 yo, never a smoker   Disposition:  After consideration of the diagnostic results and the patients response to treatment, I feel that the patient would benefit from admission.   Amount and/or Complexity of Data Reviewed Labs: ordered. Radiology: ordered.  Risk Prescription drug management. Decision regarding hospitalization.           Final Clinical Impression(s) / ED Diagnoses Final diagnoses:  Closed fracture of neck of left femur, initial encounter (HCC)  Closed fracture of left elbow, initial encounter    Rx / DC Orders ED Discharge Orders     None         Mandy Second, PA-C 03/03/24 1551    Trish Furl, MD 03/04/24 [phone]

## 2024-03-03 NOTE — Progress Notes (Signed)
 Case dw with EDP.  Xrays reviewed.  At this time will hope for surgery on Monday.  Patient has requested Dr. France Ina and will need a total hip arthroplasty for her femoral neck fractrue of the left hip.  Will discuss with patient options for the left elbow olecranon fracture with surgery vs. Nonop.  Ok to have a diet today.  NPO tonight for now.  Consult to come in am.

## 2024-03-03 NOTE — H&P (Signed)
 History and Physical  Megan Reilly BJY:[id number] DOB: [birth year] DOA: 03/03/2024  PCP: Marquetta Sit, MD Patient coming from: Home  I have personally briefly reviewed patient's old medical records in [facility]   Chief Complaint: Mechanical fall  HPI: Megan Reilly is a 83 y.o. female past medical history of subarachnoid hemorrhage secondary to a fall back in 2015, knee replacement woke up this morning for a walk and fell while walking she denies any prodromal symptoms after that she started having left hip and elbow pain.  She relates she feels like her legs gave out she denies any shortness of breath, chest pain, fever, diarrhea, no changes in her medication she did not lose consciousness.  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  Past Medical History:  Diagnosis Date   Allergy    Arthritis    Blood transfusion abn reaction or complication, no procedure mishap    83 years old after tonsilectomy   Hypertension    Osteoporosis 04/2019   T score -2.1 improved from prior DEXA   Tibia fracture    left   Vasovagal syncope    2 prior incidences last was in 2015 ; treated in ED , reports no recurrence since then    Past Surgical History:  Procedure Laterality Date   COLONOSCOPY     FOOT SURGERY     bunectomy, hammertoe, bil   LAPAROSCOPIC BILATERAL SALPINGO OOPHERECTOMY     REVERSE SHOULDER ARTHROPLASTY Left 10/14/2023   Procedure: REVERSE SHOULDER ARTHROPLASTY;  Surgeon: Winston Hawking, MD;  Location: [facility];  Service: Orthopedics;   Laterality: Left;    TONSILLECTOMY  10/12/1943   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   Procedure: RIGHT TOTAL KNEE ARTHROPLASTY;  Surgeon: Liliane Rei, MD;  Location: [facility];  Service: Orthopedics;  Laterality: Right;   TOTAL KNEE ARTHROPLASTY Left 08/09/2022   Procedure: TOTAL KNEE ARTHROPLASTY;  Surgeon: Liliane Rei, MD;  Location: [facility];  Service: Orthopedics;  Laterality: Left;   Social History:  reports that she has never smoked. She has never been exposed to tobacco smoke. She has never used smokeless tobacco. She reports current alcohol use of about 1.0 standard drink of alcohol per week. She reports that she does not use drugs.   Allergies  Allergen Reactions   Benadryl  [Diphenhydramine ] Other (See Comments)    Light headed   Hctz [Hydrochlorothiazide ] Other (See Comments)    Fatigue, body aches    Family History  Problem Relation Age of Onset   Heart disease Mother    Hyperlipidemia Mother    Stroke Mother    Heart failure Mother    Diabetes Father        type ll   Heart disease Father 54       sudden death age 47   Hyperlipidemia Father    Breast cancer Maternal Aunt      Prior to Admission medications   Medication Sig Start Date End Date Taking? Authorizing Provider  acetaminophen  (TYLENOL ) 500 MG tablet Take 500-1,000 mg by mouth every 6 (six) hours as needed  for mild pain (pain score 1-3) or headache.    [provider]  amLODipine  (NORVASC ) 5 MG tablet TAKE 1 TABLET (5 MG TOTAL) BY MOUTH DAILY. 12/12/23   Burchette, Marijean Shouts, MD  calcium carbonate (OS-CAL) 600 MG TABS Take 300 mg by mouth every other day.    [provider]  cetirizine (ZYRTEC) 10 MG tablet Take 10 mg by mouth daily as needed for allergies.    [provider]  Cholecalciferol (VITAMIN D -3 PO) Take 2,000 Units by mouth daily.    [provider]  fish oil-omega-3 fatty acids 1000 MG capsule Take 1 g by mouth daily.     [provider]   fluticasone  (FLONASE ) 50 MCG/ACT nasal spray SPRAY 1 TO 2 SPARYS IN EACH NOSTRIL DAILY. Patient taking differently: Place 1 spray into both nostrils daily as needed for allergies or rhinitis. 02/07/23   Burchette, Marijean Shouts, MD  ibuprofen  (ADVIL ) 200 MG tablet Take 200-400 mg by mouth every 6 (six) hours as needed for mild pain (pain score 1-3).    [provider]  latanoprost  (XALATAN ) 0.005 % ophthalmic solution Place 1 drop into both eyes at bedtime. 04/14/22   [provider]  losartan  (COZAAR ) 50 MG tablet TAKE 1 TABLET BY MOUTH EVERY DAY 09/12/23   Burchette, Marijean Shouts, MD  Magnesium 400 MG CAPS Take 400 mg by mouth daily. Triple    [provider]  Melatonin 3 MG TABS Take 3 mg by mouth. 3-4 times a week    [provider]  Multiple Vitamin (MULTIVITAMIN WITH MINERALS) TABS tablet Take 1 tablet by mouth every morning.    [provider]  MYRBETRIQ  50 MG TB24 tablet TAKE 1 TABLET BY MOUTH EVERY DAY 09/12/23   Burchette, Marijean Shouts, MD  traMADol  (ULTRAM ) 50 MG tablet Take 1 tablet (50 mg total) by mouth every 6 (six) hours as needed for severe pain (pain score 7-10). 10/14/23 10/13/24  Winston Hawking, MD   Physical Exam: Vitals:   03/03/24 1237 03/03/24 1300  BP: (!) 157/67 (!) 161/86  Pulse: 68 74  Resp: 12 20  Temp: 97.7 F (36.5 C)   TempSrc: Oral   SpO2: 99% 100%    General exam: Moderately built and nourished patient, lying comfortably supine on the gurney in no obvious distress. Head, eyes and ENT: Nontraumatic and normocephalic.  Neck: Supple. No JVD, carotid bruit or thyromegaly. Lymphatics: No lymphadenopathy. Respiratory system: Clear to auscultation. No increased work of breathing. Cardiovascular system: S1 and S2 heard, RRR. No JVD, murmurs, gallops, clicks or pedal edema. Gastrointestinal system: Bowel sounds soft nontender nondistended Central nervous system: Alert and oriented. No focal neurological deficits. Extremities: Symmetric 5 x  5 power. Peripheral pulses symmetrically felt.  Skin: No rashes or acute findings. Musculoskeletal system: Negative exam. Psychiatry: Pleasant and cooperative.   Labs on Admission:  Basic Metabolic Panel: Recent Labs  Lab 03/03/24 1318  NA 138  K 4.2  CL 104  CO2 20*  GLUCOSE 114*  BUN 20  CREATININE 1.02*  CALCIUM 10.8*   Liver Function Tests: Recent Labs  Lab 03/03/24 1318  AST 31  ALT 20  ALKPHOS 59  BILITOT 0.7  PROT 6.4*  ALBUMIN 4.2   No results for input(s): "LIPASE", "AMYLASE" in the last 168 hours. No results for input(s): "AMMONIA" in the last 168 hours. CBC: Recent Labs  Lab 03/03/24 1318  WBC 12.4*  NEUTROABS 9.7*  HGB 13.8  HCT 41.7  MCV 95.6  PLT  168   Cardiac Enzymes: No results for input(s): "CKTOTAL", "CKMB", "CKMBINDEX", "TROPONINI" in the last 168 hours.  BNP (last 3 results) No results for input(s): "PROBNP" in the last 8760 hours. CBG: No results for input(s): "GLUCAP" in the last 168 hours.  Radiological Exams on Admission: DG Femur Min 2 Views Left Result Date: 03/03/2024 CLINICAL DATA:  Left hip pain after fall. EXAM: LEFT FEMUR 2 VIEWS; PELVIS - 1-2 VIEW COMPARISON:  None Available. FINDINGS: Femur: Displaced femoral neck fracture. There is proximal migration of the femoral shaft with apex lateral angulation. The distal femur is intact. Previous knee arthroplasty. No focal bone lesion or erosive change. Pelvis: No additional fracture of the pelvis. Pubic rami are intact. Pubic symphysis and sacroiliac joints are congruent. Milder underlying bilateral hip osteoarthritis. IMPRESSION: Displaced left femoral neck fracture. Electronically Signed   By: Chadwick Colonel M.D.   On: 03/03/2024 14:27   DG Pelvis 1-2 Views Result Date: 03/03/2024 CLINICAL DATA:  Left hip pain after fall. EXAM: LEFT FEMUR 2 VIEWS; PELVIS - 1-2 VIEW COMPARISON:  None Available. FINDINGS: Femur: Displaced femoral neck fracture. There is proximal migration of the  femoral shaft with apex lateral angulation. The distal femur is intact. Previous knee arthroplasty. No focal bone lesion or erosive change. Pelvis: No additional fracture of the pelvis. Pubic rami are intact. Pubic symphysis and sacroiliac joints are congruent. Milder underlying bilateral hip osteoarthritis. IMPRESSION: Displaced left femoral neck fracture. Electronically Signed   By: Chadwick Colonel M.D.   On: 03/03/2024 14:27   DG Elbow Complete Left Result Date: 03/03/2024 CLINICAL DATA:  Pain after fall.  Bruising and swelling. EXAM: LEFT ELBOW - COMPLETE 3+ VIEW COMPARISON:  None Available. FINDINGS: Comminuted and displaced olecranon fracture. There is osseous distraction of at least 14 mm at the articular surface. Potential nondisplaced fracture of the radial neck. Mild degenerative spurring. There is a moderate joint effusion. Posterior soft tissue edema. IMPRESSION: 1. Comminuted and displaced olecranon fracture with osseous distraction of at least 14 mm at the articular surface. 2. Potential nondisplaced fracture of the radial neck. Electronically Signed   By: Chadwick Colonel M.D.   On: 03/03/2024 14:26   CT Cervical Spine Wo Contrast Result Date: 03/03/2024 EXAM: CT CERVICAL SPINE WITHOUT CONTRAST 03/03/2024 01:51:43 PM TECHNIQUE: CT of the cervical was performed without the administration of intravenous contrast. Multiplanar reformatted images are provided for review. Automated exposure control, iterative reconstruction, and/or weight based adjustment of the mA/kV was utilized to reduce the radiation dose to as low as reasonably achievable. COMPARISON: None available. CLINICAL HISTORY: Neck trauma (Age >= 65y).  Fall. FINDINGS: CERVICAL SPINE: BONES/ALIGNMENT: Chronic changes at the dens suggest rheumatoid arthritis. Grade 1 degenerative anterolisthesis is present at C7-T1. Skin degenerative anterolisthesis is present at C3-4. DEGENERATIVE CHANGES: Chronic loss of disc height and uncovertebral  spine is present at C4-5, C5-6, and C6-7. SOFT TISSUES: There is no prevertebral soft tissue swelling. IMPRESSION: 1. No acute abnormality of the cervical spine related to the reported neck trauma. 2. 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 Electronically signed by: Audree Leas MD 03/03/2024 02:19 PM EDT RP Workstation: [id number]   CT Head Wo Contrast Result Date: 03/03/2024 EXAM: CT HEAD WITHOUT 03/03/2024 01:51:43 PM TECHNIQUE: CT of the head was performed without the administration of intravenous contrast. Automated exposure control, iterative reconstruction, and/or weight based adjustment of the mA/kV was utilized to reduce the radiation dose to as low as reasonably achievable. COMPARISON: MR head without and with contrast  05/18/2023. CLINICAL HISTORY: Head trauma, minor (Age >= 65y). Non con. Fall. FINDINGS: BRAIN AND VENTRICLES: There is no acute intracranial hemorrhage, mass effect or midline shift. No abnormal extra-axial fluid collection. The gray-white differentiation is maintained without evidence of an acute infarct. The ventricles are proportionate to the degree of atrophy. Paraventricular and subcortical white matter hypoattenuation is moderately advanced for age, stable. ORBITS: Bilateral lens replacements are present. The globes and orbits are otherwise within normal limits. SINUSES: The visualized paranasal sinuses and mastoid air cells demonstrate no acute abnormality. 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 A second meningioma measuring 6 mm. No acute abnormality of the visualized skull or soft tissues. VASCULATURE: Atherosclerotic calcifications are present in the cavernous carotid arteries bilaterally and at the dural margin of both vertebral arteries. No hyperdense vessel is present. IMPRESSION: 1. No acute intracranial abnormality related to the minor head trauma. 2. Stable  right sphenoid wing meningioma measuring 3.5 x 3.5 x 3.0 cm and a second meningioma measuring 6 mm. 3. Moderately advanced paraventricular and subcortical white matter hypoattenuation for age, stable. 4. Atherosclerosis. Electronically signed by: Audree Leas MD 03/03/2024 02:16 PM EDT RP Workstation: [id number]    EKG: Independently reviewed.  Pending at the time of this dictation  Assessment/Plan Mechanical fall leading to closed left hip fracture [facility]): Orthopedic surgery was consulted recommended transfer to  [facility] for possible surgical intervention on 03/04/2024.  N.p.o. after midnight. She relates no prodromal symptoms. Will use Robaxin  for spasm, narcotics for pain. Will put her on a bowel regimen. Will contact PT OT for evaluation postsurgical intervention, she relates she does not want to go to a skilled nursing facility, she does want to go home if she has 24-hour support at home if needed her daughter is at bedside and confirms. Check hemoglobin post surgical intervention. Weightbearing and anticoagulation per orthopedic surgery.  Initial Olecranons displaced fracture: Further management per orthopedic surgery. Will probably need a sling.  Nondisplaced radial neck fracture Noted further management per orthopedic surgery.  Preop evaluation: She relates she could go up a flight of stairs without getting short of breath she has less than 1% chance of cardiopulmonary complications. I have explained the risk and benefits of proceeding with the surgery and she has agreed to proceed.  Essential hypertension: Hold antihypertensive medications for now her blood pressure seems to be stable. Can resume tomorrow.     DVT Prophylaxis: lovenox Code Status: Full Family Communication: daughter  Disposition Plan: inpatient     It is my clinical opinion that admission to INPATIENT is reasonable and necessary in this 83 y.o. female who comes in with multiple  fractures left hip and left olecranon's going for surgical intervention on 03/04/2024  Given the aforementioned, the predictability of an adverse outcome is felt to be significant. I expect that the patient will require at least 2 midnights in the hospital to treat this condition.  Macdonald Savoy MD [facility]   03/03/2024, 3:18 PM

## 2024-03-03 NOTE — ED Triage Notes (Signed)
 Pt arrives via [facility]. Pt reportedly was walking around her home and felt as though her blood pressure dropped, causing her to drift towards her L side. Denies dizziness or syncopal episode. Pt struck the ground and injured her L femur, knee, hip, L elbow, and hit her head. Denies LOC or blood thinner use. Pt with noted shortening and outward rotation of L leg.

## 2024-03-04 ENCOUNTER — Encounter (HOSPITAL_COMMUNITY): Payer: Self-pay | Admitting: Internal Medicine

## 2024-03-04 ENCOUNTER — Other Ambulatory Visit: Payer: Self-pay

## 2024-03-04 DIAGNOSIS — S72002P Fracture of unspecified part of neck of left femur, subsequent encounter for closed fracture with malunion: Secondary | ICD-10-CM | POA: Diagnosis not present

## 2024-03-04 DIAGNOSIS — S52135D Nondisplaced fracture of neck of left radius, subsequent encounter for closed fracture with routine healing: Secondary | ICD-10-CM

## 2024-03-04 DIAGNOSIS — I10 Essential (primary) hypertension: Secondary | ICD-10-CM | POA: Diagnosis not present

## 2024-03-04 DIAGNOSIS — S52022A Displaced fracture of olecranon process without intraarticular extension of left ulna, initial encounter for closed fracture: Secondary | ICD-10-CM | POA: Diagnosis not present

## 2024-03-04 LAB — URINALYSIS, ROUTINE W REFLEX MICROSCOPIC
Bilirubin Urine: NEGATIVE
Glucose, UA: NEGATIVE mg/dL
Hgb urine dipstick: NEGATIVE
Ketones, ur: 20 mg/dL — AB
Leukocytes,Ua: NEGATIVE
Nitrite: NEGATIVE
Protein, ur: NEGATIVE mg/dL
Specific Gravity, Urine: 1.01 (ref 1.005–1.030)
pH: 6 (ref 5.0–8.0)

## 2024-03-04 LAB — CBC
HCT: 34.9 % — ABNORMAL LOW (ref 36.0–46.0)
Hemoglobin: 11.2 g/dL — ABNORMAL LOW (ref 12.0–15.0)
MCH: 31.2 pg (ref 26.0–34.0)
MCHC: 32.1 g/dL (ref 30.0–36.0)
MCV: 97.2 fL (ref 80.0–100.0)
Platelets: 152 10*3/uL (ref 150–400)
RBC: 3.59 MIL/uL — ABNORMAL LOW (ref 3.87–5.11)
RDW: 12.9 % (ref 11.5–15.5)
WBC: 10.3 10*3/uL (ref 4.0–10.5)
nRBC: 0 % (ref 0.0–0.2)

## 2024-03-04 LAB — BASIC METABOLIC PANEL WITH GFR
Anion gap: 8 (ref 5–15)
BUN: 28 mg/dL — ABNORMAL HIGH (ref 8–23)
CO2: 24 mmol/L (ref 22–32)
Calcium: 9.7 mg/dL (ref 8.9–10.3)
Chloride: 104 mmol/L (ref 98–111)
Creatinine, Ser: 0.95 mg/dL (ref 0.44–1.00)
GFR, Estimated: 59 mL/min — ABNORMAL LOW (ref >60)
Glucose, Bld: 165 mg/dL — ABNORMAL HIGH (ref 70–99)
Potassium: 4.5 mmol/L (ref 3.5–5.1)
Sodium: 136 mmol/L (ref 135–145)

## 2024-03-04 LAB — SURGICAL PCR SCREEN
MRSA, PCR: NEGATIVE
Staphylococcus aureus: NEGATIVE

## 2024-03-04 MED ORDER — SODIUM CHLORIDE 0.9 % IV SOLN: INTRAVENOUS | Status: AC

## 2024-03-04 NOTE — H&P (View-Only) (Signed)
 ORTHOPAEDIC CONSULTATION  REQUESTING PHYSICIAN: Armenta Landau, MD  PCP:  Marquetta Sit, MD  Chief Complaint: Megan Reilly  HPI: Megan Reilly is a 83 y.o. female who complains of left hip and left elbow and arm pain following a ground-level fall yesterday afternoon while walking on lesion at Road.  She does live independently and is also independent with all ADLs.  She has a single level home that is handicap assessable.  She does have a recent history of left total knee arthroplasty in 2023 and right total knee arthroplasty in 2019, both by Dr. France Ina.  More recently in January 2025 left reverse shoulder arthroplasty by Dr. Brunilda Capra.  Here today after the fall resulted in left femoral neck fracture as well as left olecranon fracture with minimal displacement.  Resting comfortably this morning.  Denies numbness or tingling.  Past Medical History:  Diagnosis Date   Allergy    Arthritis    Blood transfusion abn reaction or complication, no procedure mishap    83 years old after tonsilectomy   Hypertension    Osteoporosis 04/2019   T score -2.1 improved from prior DEXA   Tibia fracture    left   Vasovagal syncope    2 prior incidences last was in 2015 ; treated in ED , reports no recurrence since then    Past Surgical History:  Procedure Laterality Date   COLONOSCOPY     FOOT SURGERY     bunectomy, hammertoe, bil   LAPAROSCOPIC BILATERAL SALPINGO OOPHERECTOMY     REVERSE SHOULDER ARTHROPLASTY Left 10/14/2023   Procedure: REVERSE SHOULDER ARTHROPLASTY;  Surgeon: Winston Hawking, MD;  Location: [facility];  Service: Orthopedics;  Laterality: Left;    TONSILLECTOMY  10/12/1943   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   Procedure: RIGHT TOTAL KNEE ARTHROPLASTY;  Surgeon: Liliane Rei, MD;  Location: [facility];  Service: Orthopedics;  Laterality: Right;   TOTAL KNEE ARTHROPLASTY Left 08/09/2022   Procedure: TOTAL KNEE ARTHROPLASTY;  Surgeon: Liliane Rei, MD;  Location: [facility];  Service: Orthopedics;  Laterality: Left;   Social History   Socioeconomic History   Marital status: Divorced    Spouse name: Not on file   Number of children: 5   Years of education: Not on file   Highest education level: Bachelor's degree (e.g., BA, AB, BS)  Occupational History   Not on file  Tobacco Use   Smoking status: Never    Passive exposure: Never   Smokeless tobacco: Never  Vaping Use   Vaping status: Never Used  Substance and Sexual Activity   Alcohol use: Yes    Alcohol/week: 1.0 standard drink of alcohol    Types: 1 Glasses of wine per week    Comment: occ   Drug use: No   Sexual activity: Not Currently    Birth control/protection: Surgical    Comment: BSO ,INTERCOURSE AGE 32, SEXUAL PARTNERS LESS THAN 5  Other Topics Concern   Not on file  Social History Narrative   Not on file   Social Drivers of Health   Financial Resource Strain: Low Risk  (07/17/2023)   Overall Financial Resource Strain (CARDIA)    Difficulty of Paying Living Expenses: Not hard at all  Food Insecurity: No Food Insecurity (03/04/2024)   Hunger Vital Sign    Worried About Running Out of Food in the Last Year: Never true    Ran Out of Food in the Last Year: Never true  Transportation Needs: No Transportation Needs (  03/04/2024)   PRAPARE - Administrator, Civil Service (Medical): No    Lack of Transportation (Non-Medical): No  Physical Activity: Sufficiently Active (07/17/2023)   Exercise Vital Sign    Days of Exercise per Week: 4 days    Minutes of Exercise per Session: 40 min  Stress: Stress Concern Present (07/17/2023)   [employer] of Occupational Health - Occupational Stress Questionnaire    Feeling of Stress : To some extent  Social Connections: Moderately Integrated (03/04/2024)   Social Connection and Isolation Panel [NHANES]    Frequency of Communication with Friends and Family: More than three times a week    Frequency of Social Gatherings with Friends  and Family: Twice a week    Attends Religious Services: More than 4 times per year    Active Member of [employer] or Organizations: Yes    Attends Engineer, structural: More than 4 times per year    Marital Status: Divorced   Family History  Problem Relation Age of Onset   Heart disease Mother    Hyperlipidemia Mother    Stroke Mother    Heart failure Mother    Diabetes Father        type ll   Heart disease Father 27       sudden death age 35   Hyperlipidemia Father    Breast cancer Maternal Aunt    Allergies  Allergen Reactions   Diphenhydramine  Other (See Comments)    Light-headedness   Hydrochlorothiazide  Other (See Comments)    Fatigue, body aches   Prior to Admission medications   Medication Sig Start Date End Date Taking? Authorizing Provider  acetaminophen  (TYLENOL ) 500 MG tablet Take 500 mg by mouth every 6 (six) hours as needed for mild pain (pain score 1-3) or headache.   Yes [provider]  amLODipine  (NORVASC ) 5 MG tablet TAKE 1 TABLET (5 MG TOTAL) BY MOUTH DAILY. Patient taking differently: Take 5 mg by mouth at bedtime. 12/12/23  Yes Burchette, Marijean Shouts, MD  Calcium Carb-Cholecalciferol (CALCIUM + D3 PO) Take 0.5 tablets by mouth daily.   Yes [provider]  cetirizine (ZYRTEC) 10 MG tablet Take 10 mg by mouth daily as needed for allergies.   Yes [provider]  fish oil-omega-3 fatty acids 1000 MG capsule Take 1 g by mouth daily.    Yes [provider]  fluticasone  (FLONASE ) 50 MCG/ACT nasal spray SPRAY 1 TO 2 SPARYS IN EACH NOSTRIL DAILY. Patient taking differently: Place 1 spray into both nostrils daily as needed for allergies or rhinitis. 02/07/23  Yes Burchette, Marijean Shouts, MD  ibuprofen  (ADVIL ) 200 MG tablet Take 200-400 mg by mouth every 6 (six) hours as needed for mild pain (pain score 1-3).   Yes [provider]  latanoprost  (XALATAN ) 0.005 % ophthalmic solution Place 1 drop into both eyes at bedtime. 04/14/22  Yes  [provider]  losartan  (COZAAR ) 50 MG tablet TAKE 1 TABLET BY MOUTH EVERY DAY Patient taking differently: Take 50 mg by mouth at bedtime. 09/12/23  Yes Burchette, Marijean Shouts, MD  Magnesium 400 MG CAPS Take 400 mg by mouth daily. Triple   Yes [provider]  melatonin 5 MG TABS Take 5 mg by mouth at bedtime as needed (for sleep).   Yes [provider]  Multiple Vitamin (MULTIVITAMIN WITH MINERALS) TABS tablet Take 1 tablet by mouth every morning.   Yes [provider]  MYRBETRIQ  50 MG TB24 tablet TAKE 1 TABLET  BY MOUTH EVERY DAY Patient taking differently: Take 50 mg by mouth daily as needed ("for overactive bladder symptoms"). 09/12/23  Yes Burchette, Marijean Shouts, MD  SYSTANE COMPLETE 0.6 % SOLN Place 1 drop into both eyes 3 (three) times daily as needed (for dryness).   Yes [provider]  triamcinolone  cream (KENALOG ) 0.1 % Apply 1 Application topically 2 (two) times daily as needed (for itching).   Yes [provider]  Cholecalciferol (VITAMIN D -3 PO) Take 2,000 Units by mouth daily.    [provider]  traMADol  (ULTRAM ) 50 MG tablet Take 1 tablet (50 mg total) by mouth every 6 (six) hours as needed for severe pain (pain score 7-10). Patient not taking: Reported on 03/03/2024 10/14/23 10/13/24  Winston Hawking, MD   DG Femur Min 2 Views Left Result Date: 03/03/2024 CLINICAL DATA:  Left hip pain after fall. EXAM: LEFT FEMUR 2 VIEWS; PELVIS - 1-2 VIEW COMPARISON:  None Available. FINDINGS: Femur: Displaced femoral neck fracture. There is proximal migration of the femoral shaft with apex lateral angulation. The distal femur is intact. Previous knee arthroplasty. No focal bone lesion or erosive change. Pelvis: No additional fracture of the pelvis. Pubic rami are intact. Pubic symphysis and sacroiliac joints are congruent. Milder underlying bilateral hip osteoarthritis. IMPRESSION: Displaced left femoral neck fracture. Electronically Signed   By:  Chadwick Colonel M.D.   On: 03/03/2024 14:27   DG Pelvis 1-2 Views Result Date: 03/03/2024 CLINICAL DATA:  Left hip pain after fall. EXAM: LEFT FEMUR 2 VIEWS; PELVIS - 1-2 VIEW COMPARISON:  None Available. FINDINGS: Femur: Displaced femoral neck fracture. There is proximal migration of the femoral shaft with apex lateral angulation. The distal femur is intact. Previous knee arthroplasty. No focal bone lesion or erosive change. Pelvis: No additional fracture of the pelvis. Pubic rami are intact. Pubic symphysis and sacroiliac joints are congruent. Milder underlying bilateral hip osteoarthritis. IMPRESSION: Displaced left femoral neck fracture. Electronically Signed   By: Chadwick Colonel M.D.   On: 03/03/2024 14:27   DG Elbow Complete Left Result Date: 03/03/2024 CLINICAL DATA:  Pain after fall.  Bruising and swelling. EXAM: LEFT ELBOW - COMPLETE 3+ VIEW COMPARISON:  None Available. FINDINGS: Comminuted and displaced olecranon fracture. There is osseous distraction of at least 14 mm at the articular surface. Potential nondisplaced fracture of the radial neck. Mild degenerative spurring. There is a moderate joint effusion. Posterior soft tissue edema. IMPRESSION: 1. Comminuted and displaced olecranon fracture with osseous distraction of at least 14 mm at the articular surface. 2. Potential nondisplaced fracture of the radial neck. Electronically Signed   By: Chadwick Colonel M.D.   On: 03/03/2024 14:26   CT Cervical Spine Wo Contrast Result Date: 03/03/2024 EXAM: CT CERVICAL SPINE WITHOUT CONTRAST 03/03/2024 01:51:43 PM TECHNIQUE: CT of the cervical was performed without the administration of intravenous contrast. Multiplanar reformatted images are provided for review. Automated exposure control, iterative reconstruction, and/or weight based adjustment of the mA/kV was utilized to reduce the radiation dose to as low as reasonably achievable. COMPARISON: None available. CLINICAL HISTORY: Neck trauma (Age >=  65y).  Fall. FINDINGS: CERVICAL SPINE: BONES/ALIGNMENT: Chronic changes at the dens suggest rheumatoid arthritis. Grade 1 degenerative anterolisthesis is present at C7-T1. Skin degenerative anterolisthesis is present at C3-4. DEGENERATIVE CHANGES: Chronic loss of disc height and uncovertebral spine is present at C4-5, C5-6, and C6-7. SOFT TISSUES: There is no prevertebral soft tissue swelling. IMPRESSION: 1. No acute abnormality of the cervical spine related to the reported neck  trauma. 2. 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 Electronically signed by: Audree Leas MD 03/03/2024 02:19 PM EDT RP Workstation: [id number]   CT Head Wo Contrast Result Date: 03/03/2024 EXAM: CT HEAD WITHOUT 03/03/2024 01:51:43 PM TECHNIQUE: CT of the head was performed without the administration of intravenous contrast. Automated exposure control, iterative reconstruction, and/or weight based adjustment of the mA/kV was utilized to reduce the radiation dose to as low as reasonably achievable. COMPARISON: MR head without and with contrast 05/18/2023. CLINICAL HISTORY: Head trauma, minor (Age >= 65y). Non con. Fall. FINDINGS: BRAIN AND VENTRICLES: There is no acute intracranial hemorrhage, mass effect or midline shift. No abnormal extra-axial fluid collection. The gray-white differentiation is maintained without evidence of an acute infarct. The ventricles are proportionate to the degree of atrophy. Paraventricular and subcortical white matter hypoattenuation is moderately advanced for age, stable. ORBITS: Bilateral lens replacements are present. The globes and orbits are otherwise within normal limits. SINUSES: The visualized paranasal sinuses and mastoid air cells demonstrate no acute abnormality. 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 A second meningioma measuring 6 mm. No acute abnormality of the visualized skull or  soft tissues. VASCULATURE: Atherosclerotic calcifications are present in the cavernous carotid arteries bilaterally and at the dural margin of both vertebral arteries. No hyperdense vessel is present. IMPRESSION: 1. No acute intracranial abnormality related to the minor head trauma. 2. 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 3. Moderately advanced paraventricular and subcortical white matter hypoattenuation for age, stable. 4. Atherosclerosis. Electronically signed by: Audree Leas MD 03/03/2024 02:16 PM EDT RP Workstation: [id number]    Positive ROS: All other systems have been reviewed and were otherwise negative with the exception of those mentioned in the HPI and as above.  Physical Exam: General: Alert, no acute distress Cardiovascular: No pedal edema Respiratory: No cyanosis, no use of accessory musculature GI: No organomegaly, abdomen is soft and non-tender Skin: No lesions in the area of chief complaint Neurologic: Sensation intact distally Psychiatric: Patient is competent for consent with normal mood and affect Lymphatic: No axillary or cervical lymphadenopathy  MUSCULOSKELETAL:  Left upper extremity exam:  Sling is donned.  We did remove that and noted some hematoma forming around the olecranon process with minimal diastases there.  Tender to palpation there but nontender along the radiocapitellar joint.  Passive motion of the elbow is well-tolerated.  She has 4- out of 5 strength with resisted elbow extension.  Otherwise distally neurovascular intact   Left leg exam digits does demonstrate slightly shortened position but neutral rotation.  She is distally neurovascular intact and has no tenderness to palpation about her total knee arthroplasty on the left or right.  Assessment: 1.  Left femoral neck fracture, displaced 2.  Left closed olecranon fracture, comminuted and displaced. 3.  Left nondisplaced  radial head fracture  Plan: -Regarding the left elbow Mason type I radial head fracture as well as comminuted olecranon fracture we have discussed conservative management with her.  She is interested in pursuing that.  We did discuss we could monitor for 6 to 8 weeks and if she is not satisfied could convert that to a likely olecranon excision with triceps tendon advancement.  She will be as needed sling usage for comfort, otherwise can weight-bear as tolerated to the left upper extremity.  -Left femoral neck fracture  will need total hip arthroplasty.  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  We discussed that in detail.  Plan for surgery tomorrow morning with Dr. France Ina  -Okay for diet today.  Need to be n.p.o. tonight at midnight.    Janeth Medicus, MD Cell [phone]    03/04/2024 8:50 AM

## 2024-03-04 NOTE — Progress Notes (Signed)
   03/04/24 0827  TOC Brief Assessment  Insurance and Status Reviewed  Patient has primary care physician Yes (BURCHETTE, BRUCE W)  Home environment has been reviewed From Home, daughter contact  Prior level of function: Independent  Prior/Current Home Services No current home services  Social Drivers of Health Review SDOH reviewed no interventions necessary  Readmission risk has been reviewed Yes  Transition of care needs transition of care needs identified, TOC will continue to follow   Pt may have DC needs due to mechanical fall and scheduled surgery 5/25.

## 2024-03-04 NOTE — Plan of Care (Signed)

## 2024-03-04 NOTE — Plan of Care (Signed)
   Problem: Coping: Goal: Level of anxiety will decrease Outcome: Progressing   Problem: Safety: Goal: Ability to remain free from injury will improve Outcome: Progressing   Problem: Pain Managment: Goal: General experience of comfort will improve and/or be controlled Outcome: Progressing

## 2024-03-04 NOTE — Consult Note (Signed)
 ORTHOPAEDIC CONSULTATION  REQUESTING PHYSICIAN: Armenta Landau, MD  PCP:  Marquetta Sit, MD  Chief Complaint: Megan Reilly  HPI: Britainy Reilly is a 83 y.o. female who complains of left hip and left elbow and arm pain following a ground-level fall yesterday afternoon while walking on lesion at Road.  She does live independently and is also independent with all ADLs.  She has a single level home that is handicap assessable.  She does have a recent history of left total knee arthroplasty in 2023 and right total knee arthroplasty in 2019, both by Dr. France Ina.  More recently in January 2025 left reverse shoulder arthroplasty by Dr. Brunilda Capra.  Here today after the fall resulted in left femoral neck fracture as well as left olecranon fracture with minimal displacement.  Resting comfortably this morning.  Denies numbness or tingling.  Past Medical History:  Diagnosis Date   Allergy    Arthritis    Blood transfusion abn reaction or complication, no procedure mishap    83 years old after tonsilectomy   Hypertension    Osteoporosis 04/2019   T score -2.1 improved from prior DEXA   Tibia fracture    left   Vasovagal syncope    2 prior incidences last was in 2015 ; treated in ED , reports no recurrence since then    Past Surgical History:  Procedure Laterality Date   COLONOSCOPY     FOOT SURGERY     bunectomy, hammertoe, bil   LAPAROSCOPIC BILATERAL SALPINGO OOPHERECTOMY     REVERSE SHOULDER ARTHROPLASTY Left 10/14/2023   Procedure: REVERSE SHOULDER ARTHROPLASTY;  Surgeon: Winston Hawking, MD;  Location: [facility];  Service: Orthopedics;  Laterality: Left;    TONSILLECTOMY  10/12/1943   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   Procedure: RIGHT TOTAL KNEE ARTHROPLASTY;  Surgeon: Liliane Rei, MD;  Location: [facility];  Service: Orthopedics;  Laterality: Right;   TOTAL KNEE ARTHROPLASTY Left 08/09/2022   Procedure: TOTAL KNEE ARTHROPLASTY;  Surgeon: Liliane Rei, MD;  Location: [facility];  Service: Orthopedics;  Laterality: Left;   Social History   Socioeconomic History   Marital status: Divorced    Spouse name: Not on file   Number of children: 5   Years of education: Not on file   Highest education level: Bachelor's degree (e.g., BA, AB, BS)  Occupational History   Not on file  Tobacco Use   Smoking status: Never    Passive exposure: Never   Smokeless tobacco: Never  Vaping Use   Vaping status: Never Used  Substance and Sexual Activity   Alcohol use: Yes    Alcohol/week: 1.0 standard drink of alcohol    Types: 1 Glasses of wine per week    Comment: occ   Drug use: No   Sexual activity: Not Currently    Birth control/protection: Surgical    Comment: BSO ,INTERCOURSE AGE 32, SEXUAL PARTNERS LESS THAN 5  Other Topics Concern   Not on file  Social History Narrative   Not on file   Social Drivers of Health   Financial Resource Strain: Low Risk  (07/17/2023)   Overall Financial Resource Strain (CARDIA)    Difficulty of Paying Living Expenses: Not hard at all  Food Insecurity: No Food Insecurity (03/04/2024)   Hunger Vital Sign    Worried About Running Out of Food in the Last Year: Never true    Ran Out of Food in the Last Year: Never true  Transportation Needs: No Transportation Needs (  03/04/2024)   PRAPARE - Administrator, Civil Service (Medical): No    Lack of Transportation (Non-Medical): No  Physical Activity: Sufficiently Active (07/17/2023)   Exercise Vital Sign    Days of Exercise per Week: 4 days    Minutes of Exercise per Session: 40 min  Stress: Stress Concern Present (07/17/2023)   [employer] of Occupational Health - Occupational Stress Questionnaire    Feeling of Stress : To some extent  Social Connections: Moderately Integrated (03/04/2024)   Social Connection and Isolation Panel [NHANES]    Frequency of Communication with Friends and Family: More than three times a week    Frequency of Social Gatherings with Friends  and Family: Twice a week    Attends Religious Services: More than 4 times per year    Active Member of [employer] or Organizations: Yes    Attends Engineer, structural: More than 4 times per year    Marital Status: Divorced   Family History  Problem Relation Age of Onset   Heart disease Mother    Hyperlipidemia Mother    Stroke Mother    Heart failure Mother    Diabetes Father        type ll   Heart disease Father 27       sudden death age 35   Hyperlipidemia Father    Breast cancer Maternal Aunt    Allergies  Allergen Reactions   Diphenhydramine  Other (See Comments)    Light-headedness   Hydrochlorothiazide  Other (See Comments)    Fatigue, body aches   Prior to Admission medications   Medication Sig Start Date End Date Taking? Authorizing Provider  acetaminophen  (TYLENOL ) 500 MG tablet Take 500 mg by mouth every 6 (six) hours as needed for mild pain (pain score 1-3) or headache.   Yes [provider]  amLODipine  (NORVASC ) 5 MG tablet TAKE 1 TABLET (5 MG TOTAL) BY MOUTH DAILY. Patient taking differently: Take 5 mg by mouth at bedtime. 12/12/23  Yes Burchette, Marijean Shouts, MD  Calcium Carb-Cholecalciferol (CALCIUM + D3 PO) Take 0.5 tablets by mouth daily.   Yes [provider]  cetirizine (ZYRTEC) 10 MG tablet Take 10 mg by mouth daily as needed for allergies.   Yes [provider]  fish oil-omega-3 fatty acids 1000 MG capsule Take 1 g by mouth daily.    Yes [provider]  fluticasone  (FLONASE ) 50 MCG/ACT nasal spray SPRAY 1 TO 2 SPARYS IN EACH NOSTRIL DAILY. Patient taking differently: Place 1 spray into both nostrils daily as needed for allergies or rhinitis. 02/07/23  Yes Burchette, Marijean Shouts, MD  ibuprofen  (ADVIL ) 200 MG tablet Take 200-400 mg by mouth every 6 (six) hours as needed for mild pain (pain score 1-3).   Yes [provider]  latanoprost  (XALATAN ) 0.005 % ophthalmic solution Place 1 drop into both eyes at bedtime. 04/14/22  Yes  [provider]  losartan  (COZAAR ) 50 MG tablet TAKE 1 TABLET BY MOUTH EVERY DAY Patient taking differently: Take 50 mg by mouth at bedtime. 09/12/23  Yes Burchette, Marijean Shouts, MD  Magnesium 400 MG CAPS Take 400 mg by mouth daily. Triple   Yes [provider]  melatonin 5 MG TABS Take 5 mg by mouth at bedtime as needed (for sleep).   Yes [provider]  Multiple Vitamin (MULTIVITAMIN WITH MINERALS) TABS tablet Take 1 tablet by mouth every morning.   Yes [provider]  MYRBETRIQ  50 MG TB24 tablet TAKE 1 TABLET  BY MOUTH EVERY DAY Patient taking differently: Take 50 mg by mouth daily as needed ("for overactive bladder symptoms"). 09/12/23  Yes Burchette, Marijean Shouts, MD  SYSTANE COMPLETE 0.6 % SOLN Place 1 drop into both eyes 3 (three) times daily as needed (for dryness).   Yes [provider]  triamcinolone  cream (KENALOG ) 0.1 % Apply 1 Application topically 2 (two) times daily as needed (for itching).   Yes [provider]  Cholecalciferol (VITAMIN D -3 PO) Take 2,000 Units by mouth daily.    [provider]  traMADol  (ULTRAM ) 50 MG tablet Take 1 tablet (50 mg total) by mouth every 6 (six) hours as needed for severe pain (pain score 7-10). Patient not taking: Reported on 03/03/2024 10/14/23 10/13/24  Winston Hawking, MD   DG Femur Min 2 Views Left Result Date: 03/03/2024 CLINICAL DATA:  Left hip pain after fall. EXAM: LEFT FEMUR 2 VIEWS; PELVIS - 1-2 VIEW COMPARISON:  None Available. FINDINGS: Femur: Displaced femoral neck fracture. There is proximal migration of the femoral shaft with apex lateral angulation. The distal femur is intact. Previous knee arthroplasty. No focal bone lesion or erosive change. Pelvis: No additional fracture of the pelvis. Pubic rami are intact. Pubic symphysis and sacroiliac joints are congruent. Milder underlying bilateral hip osteoarthritis. IMPRESSION: Displaced left femoral neck fracture. Electronically Signed   By:  Chadwick Colonel M.D.   On: 03/03/2024 14:27   DG Pelvis 1-2 Views Result Date: 03/03/2024 CLINICAL DATA:  Left hip pain after fall. EXAM: LEFT FEMUR 2 VIEWS; PELVIS - 1-2 VIEW COMPARISON:  None Available. FINDINGS: Femur: Displaced femoral neck fracture. There is proximal migration of the femoral shaft with apex lateral angulation. The distal femur is intact. Previous knee arthroplasty. No focal bone lesion or erosive change. Pelvis: No additional fracture of the pelvis. Pubic rami are intact. Pubic symphysis and sacroiliac joints are congruent. Milder underlying bilateral hip osteoarthritis. IMPRESSION: Displaced left femoral neck fracture. Electronically Signed   By: Chadwick Colonel M.D.   On: 03/03/2024 14:27   DG Elbow Complete Left Result Date: 03/03/2024 CLINICAL DATA:  Pain after fall.  Bruising and swelling. EXAM: LEFT ELBOW - COMPLETE 3+ VIEW COMPARISON:  None Available. FINDINGS: Comminuted and displaced olecranon fracture. There is osseous distraction of at least 14 mm at the articular surface. Potential nondisplaced fracture of the radial neck. Mild degenerative spurring. There is a moderate joint effusion. Posterior soft tissue edema. IMPRESSION: 1. Comminuted and displaced olecranon fracture with osseous distraction of at least 14 mm at the articular surface. 2. Potential nondisplaced fracture of the radial neck. Electronically Signed   By: Chadwick Colonel M.D.   On: 03/03/2024 14:26   CT Cervical Spine Wo Contrast Result Date: 03/03/2024 EXAM: CT CERVICAL SPINE WITHOUT CONTRAST 03/03/2024 01:51:43 PM TECHNIQUE: CT of the cervical was performed without the administration of intravenous contrast. Multiplanar reformatted images are provided for review. Automated exposure control, iterative reconstruction, and/or weight based adjustment of the mA/kV was utilized to reduce the radiation dose to as low as reasonably achievable. COMPARISON: None available. CLINICAL HISTORY: Neck trauma (Age >=  65y).  Fall. FINDINGS: CERVICAL SPINE: BONES/ALIGNMENT: Chronic changes at the dens suggest rheumatoid arthritis. Grade 1 degenerative anterolisthesis is present at C7-T1. Skin degenerative anterolisthesis is present at C3-4. DEGENERATIVE CHANGES: Chronic loss of disc height and uncovertebral spine is present at C4-5, C5-6, and C6-7. SOFT TISSUES: There is no prevertebral soft tissue swelling. IMPRESSION: 1. No acute abnormality of the cervical spine related to the reported neck  trauma. 2. 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 Electronically signed by: Audree Leas MD 03/03/2024 02:19 PM EDT RP Workstation: [id number]   CT Head Wo Contrast Result Date: 03/03/2024 EXAM: CT HEAD WITHOUT 03/03/2024 01:51:43 PM TECHNIQUE: CT of the head was performed without the administration of intravenous contrast. Automated exposure control, iterative reconstruction, and/or weight based adjustment of the mA/kV was utilized to reduce the radiation dose to as low as reasonably achievable. COMPARISON: MR head without and with contrast 05/18/2023. CLINICAL HISTORY: Head trauma, minor (Age >= 65y). Non con. Fall. FINDINGS: BRAIN AND VENTRICLES: There is no acute intracranial hemorrhage, mass effect or midline shift. No abnormal extra-axial fluid collection. The gray-white differentiation is maintained without evidence of an acute infarct. The ventricles are proportionate to the degree of atrophy. Paraventricular and subcortical white matter hypoattenuation is moderately advanced for age, stable. ORBITS: Bilateral lens replacements are present. The globes and orbits are otherwise within normal limits. SINUSES: The visualized paranasal sinuses and mastoid air cells demonstrate no acute abnormality. 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 A second meningioma measuring 6 mm. No acute abnormality of the visualized skull or  soft tissues. VASCULATURE: Atherosclerotic calcifications are present in the cavernous carotid arteries bilaterally and at the dural margin of both vertebral arteries. No hyperdense vessel is present. IMPRESSION: 1. No acute intracranial abnormality related to the minor head trauma. 2. 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 3. Moderately advanced paraventricular and subcortical white matter hypoattenuation for age, stable. 4. Atherosclerosis. Electronically signed by: Audree Leas MD 03/03/2024 02:16 PM EDT RP Workstation: [id number]    Positive ROS: All other systems have been reviewed and were otherwise negative with the exception of those mentioned in the HPI and as above.  Physical Exam: General: Alert, no acute distress Cardiovascular: No pedal edema Respiratory: No cyanosis, no use of accessory musculature GI: No organomegaly, abdomen is soft and non-tender Skin: No lesions in the area of chief complaint Neurologic: Sensation intact distally Psychiatric: Patient is competent for consent with normal mood and affect Lymphatic: No axillary or cervical lymphadenopathy  MUSCULOSKELETAL:  Left upper extremity exam:  Sling is donned.  We did remove that and noted some hematoma forming around the olecranon process with minimal diastases there.  Tender to palpation there but nontender along the radiocapitellar joint.  Passive motion of the elbow is well-tolerated.  She has 4- out of 5 strength with resisted elbow extension.  Otherwise distally neurovascular intact   Left leg exam digits does demonstrate slightly shortened position but neutral rotation.  She is distally neurovascular intact and has no tenderness to palpation about her total knee arthroplasty on the left or right.  Assessment: 1.  Left femoral neck fracture, displaced 2.  Left closed olecranon fracture, comminuted and displaced. 3.  Left nondisplaced  radial head fracture  Plan: -Regarding the left elbow Mason type I radial head fracture as well as comminuted olecranon fracture we have discussed conservative management with her.  She is interested in pursuing that.  We did discuss we could monitor for 6 to 8 weeks and if she is not satisfied could convert that to a likely olecranon excision with triceps tendon advancement.  She will be as needed sling usage for comfort, otherwise can weight-bear as tolerated to the left upper extremity.  -Left femoral neck fracture  will need total hip arthroplasty.  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  We discussed that in detail.  Plan for surgery tomorrow morning with Dr. France Ina  -Okay for diet today.  Need to be n.p.o. tonight at midnight.    Janeth Medicus, MD Cell [phone]    03/04/2024 8:50 AM

## 2024-03-04 NOTE — Progress Notes (Signed)
 PROGRESS NOTE    Megan Reilly  ZOX:[id number] DOB: [birth year] DOA: 03/03/2024 PCP: Marquetta Sit, MD   Chief Complaint  Patient presents with   Fall    Brief Narrative:  Patient 83 year old female history of subarachnoid hemorrhage secondary to a fall back in 2015, knee replacement, was ambulating on day of admission when she fell while walking, denied any syncopal or prodromal symptoms and developed left hip and elbow pain.  Patient stated her legs just gave out from her.  Workup in the ED concerning for a commuted displaced olecranon fracture, radial head fracture, displaced left femoral fracture.  Patient admitted.  Patient seen in consultation by orthopedics, and patient for surgical repair of left femoral neck fracture on 03/05/2024   Assessment & Plan:   Principal Problem:   Closed left hip fracture [facility]) Active Problems:   Essential hypertension   Olecranon fracture, left, closed, initial encounter   Radial neck fracture  #1 left femoral neck fracture, displaced - Secondary to mechanical fall. - Patient seen in consultation by orthopedics who are recommending total hip arthroplasty to be done tomorrow, 03/05/2024 per Dr. Beverly Buckler. - Pain management, DVT prophylaxis per orthopedics. - Postop PT/OT.  2.  Left closed olecranon fracture, commuted and displaced/left nondisplaced radial head fracture -Secondary to mechanical fall. - Patient seen in consultation by orthopedics who have discussed with patient and recommending conservative management at this time. - Sling usage for comfort per orthopedics and can WBAT to left upper extremity. -PT/OT. - Per orthopedics.  3.  Fall -Patient denies any prodromal symptoms, no syncopal episodes, noted that her legs gave out. - Patient also stating blood pressure was low close in her fall as she states systolic blood pressures worse in the 90s. - Hold antihypertensive medications. - PT/OT.    4 hypertension -Patient noted  that systolic blood pressures were in the 90s after her fall. - Continue to hold antihypertensive medications. - Gentle hydration.     DVT prophylaxis: Lovenox Code Status: Full Family Communication: Updated patient, son, family at bedside. Disposition: TBD  Status is: Inpatient Remains inpatient appropriate because: Severity of illness   Consultants:  Orthopedics: Dr. Hiram Lukes 03/04/2024  Procedures: CT head/CT C-spine 03/03/2024 Plain films of the left elbow 03/03/2024 Plain films of the left femur 03/03/2024 Plain films of the pelvis 03/03/2024   Antimicrobials:  Anti-infectives (From admission, onward)    None         Subjective: Patient lying in bed, son, daughter-in-law, grandchildren at bedside.  Patient states when she fell it was because her blood pressure had dropped and systolics in the 90s from what she remembers.  Patient denies any syncopal episode.  Patient still with some pain in the left hip.  Denies any significant pain in the left upper extremity.  Objective: Vitals:   03/03/24 1945 03/03/24 2312 03/04/24 0543 03/04/24 1343  BP: (!) 141/69 118/62 (!) 116/59 138/70  Pulse: 83 93 79 71  Resp:  18 18 18   Temp: 98.7 F (37.1 C) (!) 100.6 F (38.1 C) 98.3 F (36.8 C) 98.2 F (36.8 C)  TempSrc: Oral Oral Oral Oral  SpO2: 96% 92% 92% 91%  Weight: 71.6 kg     Height: 5\' 8"  (1.727 m)       Intake/Output Summary (Last 24 hours) at 03/04/2024 1602 Last data filed at 03/04/2024 1516 Gross per 24 hour  Intake 302.86 ml  Output 950 ml  Net -647.14 ml   Filed Weights   03/03/24 1945  Weight: 71.6 kg    Examination:  General exam: NAD. Respiratory system: Clear to auscultation anterior lung fields.  No wheezes, no crackles, no rhonchi.  Fair air movement.  Speaking in full sentences.Aaron Aas Respiratory effort normal. Cardiovascular system: S1 & S2 heard, RRR. No JVD, murmurs, rubs, gallops or clicks. No pedal edema. Gastrointestinal system: Abdomen is  nondistended, soft and nontender. No organomegaly or masses felt. Normal bowel sounds heard. Central nervous system: Alert and oriented. No focal neurological deficits. Extremities: Left hip with ice.  No significant tenderness to palpation.  Skin: No rashes, lesions or ulcers Psychiatry: Judgement and insight appear normal. Mood & affect appropriate.     Data Reviewed: I have personally reviewed following labs and imaging studies  CBC: Recent Labs  Lab 03/03/24 1318 03/04/24 0328  WBC 12.4* 10.3  NEUTROABS 9.7*  --   HGB 13.8 11.2*  HCT 41.7 34.9*  MCV 95.6 97.2  PLT 168 152    Basic Metabolic Panel: Recent Labs  Lab 03/03/24 1318 03/04/24 0328  NA 138 136  K 4.2 4.5  CL 104 104  CO2 20* 24  GLUCOSE 114* 165*  BUN 20 28*  CREATININE 1.02* 0.95  CALCIUM 10.8* 9.7    GFR: Estimated Creatinine Clearance: 45.3 mL/min (by C-G formula based on SCr of 0.95 mg/dL).  Liver Function Tests: Recent Labs  Lab 03/03/24 1318  AST 31  ALT 20  ALKPHOS 59  BILITOT 0.7  PROT 6.4*  ALBUMIN 4.2    CBG: No results for input(s): "GLUCAP" in the last 168 hours.   Recent Results (from the past 240 hours)  Surgical pcr screen     Status: None   Collection Time: 03/04/24  5:00 AM   Specimen: Nasal Mucosa; Nasal Swab  Result Value Ref Range Status   MRSA, PCR NEGATIVE NEGATIVE Final   Staphylococcus aureus NEGATIVE NEGATIVE Final    Comment: (NOTE) The Xpert SA Assay (FDA approved for NASAL specimens in patients 18 years of age and older), is one component of a comprehensive surveillance program. It is not intended to diagnose infection nor to guide or monitor treatment. Performed at [facility], [address], [place], [place] [address]          Radiology Studies: DG Femur Min 2 Views Left Result Date: 03/03/2024 CLINICAL DATA:  Left hip pain after fall. EXAM: LEFT FEMUR 2 VIEWS; PELVIS - 1-2 VIEW COMPARISON:  None Available. FINDINGS: Femur:  Displaced femoral neck fracture. There is proximal migration of the femoral shaft with apex lateral angulation. The distal femur is intact. Previous knee arthroplasty. No focal bone lesion or erosive change. Pelvis: No additional fracture of the pelvis. Pubic rami are intact. Pubic symphysis and sacroiliac joints are congruent. Milder underlying bilateral hip osteoarthritis. IMPRESSION: Displaced left femoral neck fracture. Electronically Signed   By: Chadwick Colonel M.D.   On: 03/03/2024 14:27   DG Pelvis 1-2 Views Result Date: 03/03/2024 CLINICAL DATA:  Left hip pain after fall. EXAM: LEFT FEMUR 2 VIEWS; PELVIS - 1-2 VIEW COMPARISON:  None Available. FINDINGS: Femur: Displaced femoral neck fracture. There is proximal migration of the femoral shaft with apex lateral angulation. The distal femur is intact. Previous knee arthroplasty. No focal bone lesion or erosive change. Pelvis: No additional fracture of the pelvis. Pubic rami are intact. Pubic symphysis and sacroiliac joints are congruent. Milder underlying bilateral hip osteoarthritis. IMPRESSION: Displaced left femoral neck fracture. Electronically Signed   By: Chadwick Colonel M.D.   On:  03/03/2024 14:27   DG Elbow Complete Left Result Date: 03/03/2024 CLINICAL DATA:  Pain after fall.  Bruising and swelling. EXAM: LEFT ELBOW - COMPLETE 3+ VIEW COMPARISON:  None Available. FINDINGS: Comminuted and displaced olecranon fracture. There is osseous distraction of at least 14 mm at the articular surface. Potential nondisplaced fracture of the radial neck. Mild degenerative spurring. There is a moderate joint effusion. Posterior soft tissue edema. IMPRESSION: 1. Comminuted and displaced olecranon fracture with osseous distraction of at least 14 mm at the articular surface. 2. Potential nondisplaced fracture of the radial neck. Electronically Signed   By: Chadwick Colonel M.D.   On: 03/03/2024 14:26   CT Cervical Spine Wo Contrast Result Date:  03/03/2024 EXAM: CT CERVICAL SPINE WITHOUT CONTRAST 03/03/2024 01:51:43 PM TECHNIQUE: CT of the cervical was performed without the administration of intravenous contrast. Multiplanar reformatted images are provided for review. Automated exposure control, iterative reconstruction, and/or weight based adjustment of the mA/kV was utilized to reduce the radiation dose to as low as reasonably achievable. COMPARISON: None available. CLINICAL HISTORY: Neck trauma (Age >= 65y).  Fall. FINDINGS: CERVICAL SPINE: BONES/ALIGNMENT: Chronic changes at the dens suggest rheumatoid arthritis. Grade 1 degenerative anterolisthesis is present at C7-T1. Skin degenerative anterolisthesis is present at C3-4. DEGENERATIVE CHANGES: Chronic loss of disc height and uncovertebral spine is present at C4-5, C5-6, and C6-7. SOFT TISSUES: There is no prevertebral soft tissue swelling. IMPRESSION: 1. No acute abnormality of the cervical spine related to the reported neck trauma. 2. 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 Electronically signed by: Audree Leas MD 03/03/2024 02:19 PM EDT RP Workstation: [id number]   CT Head Wo Contrast Result Date: 03/03/2024 EXAM: CT HEAD WITHOUT 03/03/2024 01:51:43 PM TECHNIQUE: CT of the head was performed without the administration of intravenous contrast. Automated exposure control, iterative reconstruction, and/or weight based adjustment of the mA/kV was utilized to reduce the radiation dose to as low as reasonably achievable. COMPARISON: MR head without and with contrast 05/18/2023. CLINICAL HISTORY: Head trauma, minor (Age >= 65y). Non con. Fall. FINDINGS: BRAIN AND VENTRICLES: There is no acute intracranial hemorrhage, mass effect or midline shift. No abnormal extra-axial fluid collection. The gray-white differentiation is maintained without evidence of an acute infarct. The ventricles are proportionate to the degree of atrophy. Paraventricular and subcortical white matter  hypoattenuation is moderately advanced for age, stable. ORBITS: Bilateral lens replacements are present. The globes and orbits are otherwise within normal limits. SINUSES: The visualized paranasal sinuses and mastoid air cells demonstrate no acute abnormality. 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 A second meningioma measuring 6 mm. No acute abnormality of the visualized skull or soft tissues. VASCULATURE: Atherosclerotic calcifications are present in the cavernous carotid arteries bilaterally and at the dural margin of both vertebral arteries. No hyperdense vessel is present. IMPRESSION: 1. No acute intracranial abnormality related to the minor head trauma. 2. 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 3. Moderately advanced paraventricular and subcortical white matter hypoattenuation for age, stable. 4. Atherosclerosis. Electronically signed by: Audree Leas MD 03/03/2024 02:16 PM EDT RP Workstation: [id number]        Scheduled Meds:  calcium-vitamin D   0.5 tablet Oral Q breakfast   cholecalciferol  2,000 Units Oral Daily   enoxaparin (LOVENOX) injection  40 mg Subcutaneous QHS   latanoprost   1 drop Both  Eyes QHS   magnesium oxide  400 mg Oral Daily   polyethylene glycol  17 g Oral BID   Continuous Infusions:  sodium chloride  75 mL/hr at 03/04/24 1516     LOS: 1 day    Time spent: 40 minutes    Hilda Lovings, MD [facility]   To contact the attending provider between 7A-7P or the covering provider during after hours 7P-7A, please log into the web site [email or web address] and access using universal [place] password for that web site. If you do not have the password, please call the hospital operator.  03/04/2024, 4:02 PM

## 2024-03-05 ENCOUNTER — Encounter (HOSPITAL_COMMUNITY)
Admission: EM | Disposition: A | Discharge status: Skilled Nursing Facility | Payer: Self-pay | Source: Home / Self Care | Attending: Internal Medicine

## 2024-03-05 ENCOUNTER — Inpatient Hospital Stay (HOSPITAL_COMMUNITY)

## 2024-03-05 ENCOUNTER — Inpatient Hospital Stay (HOSPITAL_COMMUNITY): Admitting: Anesthesiology

## 2024-03-05 DIAGNOSIS — D649 Anemia, unspecified: Secondary | ICD-10-CM | POA: Diagnosis not present

## 2024-03-05 DIAGNOSIS — W19XXXA Unspecified fall, initial encounter: Secondary | ICD-10-CM

## 2024-03-05 DIAGNOSIS — S72002P Fracture of unspecified part of neck of left femur, subsequent encounter for closed fracture with malunion: Secondary | ICD-10-CM | POA: Diagnosis not present

## 2024-03-05 DIAGNOSIS — S52022A Displaced fracture of olecranon process without intraarticular extension of left ulna, initial encounter for closed fracture: Secondary | ICD-10-CM | POA: Diagnosis not present

## 2024-03-05 DIAGNOSIS — I10 Essential (primary) hypertension: Secondary | ICD-10-CM | POA: Diagnosis not present

## 2024-03-05 DIAGNOSIS — S72002A Fracture of unspecified part of neck of left femur, initial encounter for closed fracture: Secondary | ICD-10-CM

## 2024-03-05 DIAGNOSIS — W19XXXD Unspecified fall, subsequent encounter: Secondary | ICD-10-CM

## 2024-03-05 DIAGNOSIS — S52135D Nondisplaced fracture of neck of left radius, subsequent encounter for closed fracture with routine healing: Secondary | ICD-10-CM | POA: Diagnosis not present

## 2024-03-05 HISTORY — PX: TOTAL HIP ARTHROPLASTY: SHX124

## 2024-03-05 LAB — CBC
HCT: 33.8 % — ABNORMAL LOW (ref 36.0–46.0)
Hemoglobin: 10.9 g/dL — ABNORMAL LOW (ref 12.0–15.0)
MCH: 31.8 pg (ref 26.0–34.0)
MCHC: 32.2 g/dL (ref 30.0–36.0)
MCV: 98.5 fL (ref 80.0–100.0)
Platelets: 128 10*3/uL — ABNORMAL LOW (ref 150–400)
RBC: 3.43 MIL/uL — ABNORMAL LOW (ref 3.87–5.11)
RDW: 13.2 % (ref 11.5–15.5)
WBC: 9.5 10*3/uL (ref 4.0–10.5)
nRBC: 0 % (ref 0.0–0.2)

## 2024-03-05 LAB — BASIC METABOLIC PANEL WITH GFR
Anion gap: 7 (ref 5–15)
BUN: 21 mg/dL (ref 8–23)
CO2: 22 mmol/L (ref 22–32)
Calcium: 8.7 mg/dL — ABNORMAL LOW (ref 8.9–10.3)
Chloride: 105 mmol/L (ref 98–111)
Creatinine, Ser: 0.99 mg/dL (ref 0.44–1.00)
GFR, Estimated: 57 mL/min — ABNORMAL LOW (ref >60)
Glucose, Bld: 224 mg/dL — ABNORMAL HIGH (ref 70–99)
Potassium: 3.9 mmol/L (ref 3.5–5.1)
Sodium: 134 mmol/L — ABNORMAL LOW (ref 135–145)

## 2024-03-05 LAB — TYPE AND SCREEN
ABO/RH(D): A POS
Antibody Screen: NEGATIVE

## 2024-03-05 SURGERY — ARTHROPLASTY, HIP, TOTAL, ANTERIOR APPROACH
Anesthesia: General | Site: Hip | Laterality: Left

## 2024-03-05 MED ORDER — LIDOCAINE HCL (PF) 2 % IJ SOLN
INTRAMUSCULAR | Status: AC
  Filled 2024-03-05: qty 5

## 2024-03-05 MED ORDER — MEPERIDINE HCL 50 MG/ML IJ SOLN: 6.2500 mg | INTRAMUSCULAR | Status: DC | PRN

## 2024-03-05 MED ORDER — ACETAMINOPHEN 325 MG PO TABS
325.0000 mg | ORAL_TABLET | Freq: Four times a day (QID) | ORAL | Status: DC | PRN
  Filled 2024-03-05: qty 2

## 2024-03-05 MED ORDER — DEXAMETHASONE SODIUM PHOSPHATE 10 MG/ML IJ SOLN
10.0000 mg | Freq: Once | INTRAMUSCULAR | Status: AC
  Administered 2024-03-06: 10 mg via INTRAVENOUS
  Filled 2024-03-05: qty 1

## 2024-03-05 MED ORDER — LACTATED RINGERS IV SOLN: INTRAVENOUS | Status: DC | PRN

## 2024-03-05 MED ORDER — ACETAMINOPHEN 10 MG/ML IV SOLN
INTRAVENOUS | Status: DC | PRN
  Administered 2024-03-05: 1000 mg via INTRAVENOUS

## 2024-03-05 MED ORDER — ONDANSETRON HCL 4 MG PO TABS: 4.0000 mg | ORAL_TABLET | Freq: Four times a day (QID) | ORAL | Status: DC | PRN

## 2024-03-05 MED ORDER — TRANEXAMIC ACID-NACL 1000-0.7 MG/100ML-% IV SOLN
INTRAVENOUS | Status: DC | PRN
  Administered 2024-03-05: 1000 mg via INTRAVENOUS

## 2024-03-05 MED ORDER — METOCLOPRAMIDE HCL 5 MG PO TABS: 5.0000 mg | ORAL_TABLET | Freq: Three times a day (TID) | ORAL | Status: DC | PRN

## 2024-03-05 MED ORDER — MORPHINE SULFATE (PF) 2 MG/ML IV SOLN
1.0000 mg | INTRAVENOUS | Status: DC | PRN
Start: 2024-03-05 — End: 2024-03-08

## 2024-03-05 MED ORDER — 0.9 % SODIUM CHLORIDE (POUR BTL) OPTIME
TOPICAL | Status: DC | PRN
  Administered 2024-03-05: 1000 mL

## 2024-03-05 MED ORDER — ACETAMINOPHEN 10 MG/ML IV SOLN
INTRAVENOUS | Status: AC
  Filled 2024-03-05: qty 100

## 2024-03-05 MED ORDER — FLEET ENEMA RE ENEM: 1.0000 | ENEMA | Freq: Once | RECTAL | Status: DC | PRN

## 2024-03-05 MED ORDER — FENTANYL CITRATE (PF) 100 MCG/2ML IJ SOLN
INTRAMUSCULAR | Status: DC | PRN
  Administered 2024-03-05: 50 ug via INTRAVENOUS
  Administered 2024-03-05: 100 ug via INTRAVENOUS
  Administered 2024-03-05: 50 ug via INTRAVENOUS

## 2024-03-05 MED ORDER — BUPIVACAINE-EPINEPHRINE (PF) 0.25% -1:200000 IJ SOLN
INTRAMUSCULAR | Status: AC
  Filled 2024-03-05: qty 30

## 2024-03-05 MED ORDER — OXYCODONE HCL 5 MG/5ML PO SOLN: 5.0000 mg | Freq: Once | ORAL | Status: DC | PRN

## 2024-03-05 MED ORDER — PROPOFOL 10 MG/ML IV BOLUS
INTRAVENOUS | Status: DC | PRN
  Administered 2024-03-05: 100 mg via INTRAVENOUS

## 2024-03-05 MED ORDER — POLYVINYL ALCOHOL 1.4 % OP SOLN: 1.0000 [drp] | Freq: Three times a day (TID) | OPHTHALMIC | Status: DC | PRN

## 2024-03-05 MED ORDER — ONDANSETRON HCL 4 MG/2ML IJ SOLN: 4.0000 mg | Freq: Four times a day (QID) | INTRAMUSCULAR | Status: DC | PRN

## 2024-03-05 MED ORDER — DEXAMETHASONE SODIUM PHOSPHATE 10 MG/ML IJ SOLN
INTRAMUSCULAR | Status: DC | PRN
  Administered 2024-03-05: 5 mg via INTRAVENOUS

## 2024-03-05 MED ORDER — ASPIRIN 81 MG PO CHEW
81.0000 mg | CHEWABLE_TABLET | Freq: Every day | ORAL | Status: DC
  Administered 2024-03-07 – 2024-03-08 (×2): 81 mg via ORAL
  Filled 2024-03-05 (×3): qty 1

## 2024-03-05 MED ORDER — CEFAZOLIN SODIUM-DEXTROSE 2-4 GM/100ML-% IV SOLN
INTRAVENOUS | Status: AC
  Filled 2024-03-05: qty 100

## 2024-03-05 MED ORDER — LIDOCAINE 2% (20 MG/ML) 5 ML SYRINGE: INTRAMUSCULAR | Status: DC | PRN

## 2024-03-05 MED ORDER — PHENYLEPHRINE 80 MCG/ML (10ML) SYRINGE FOR IV PUSH (FOR BLOOD PRESSURE SUPPORT)
PREFILLED_SYRINGE | INTRAVENOUS | Status: DC | PRN
  Administered 2024-03-05: 120 ug via INTRAVENOUS
  Administered 2024-03-05: 80 ug via INTRAVENOUS

## 2024-03-05 MED ORDER — LIDOCAINE HCL (PF) 2 % IJ SOLN
INTRAMUSCULAR | Status: DC | PRN
Start: 2024-03-05 — End: 2024-03-05
  Administered 2024-03-05: 100 mg via INTRADERMAL

## 2024-03-05 MED ORDER — CEFAZOLIN SODIUM-DEXTROSE 2-3 GM-%(50ML) IV SOLR
INTRAVENOUS | Status: DC | PRN
  Administered 2024-03-05: 2 g via INTRAVENOUS

## 2024-03-05 MED ORDER — POLYETHYLENE GLYCOL 3350 17 G PO PACK
17.0000 g | PACK | Freq: Every day | ORAL | Status: DC | PRN
  Administered 2024-03-07 – 2024-03-08 (×2): 17 g via ORAL
  Filled 2024-03-05 (×2): qty 1

## 2024-03-05 MED ORDER — SODIUM CHLORIDE 0.9 % IV SOLN: INTRAVENOUS | Status: DC

## 2024-03-05 MED ORDER — WATER FOR IRRIGATION, STERILE IR SOLN
Status: DC | PRN
  Administered 2024-03-05: 2000 mL

## 2024-03-05 MED ORDER — MENTHOL 3 MG MT LOZG: 1.0000 | LOZENGE | OROMUCOSAL | Status: DC | PRN

## 2024-03-05 MED ORDER — METOCLOPRAMIDE HCL 5 MG/ML IJ SOLN: 5.0000 mg | Freq: Three times a day (TID) | INTRAMUSCULAR | Status: DC | PRN

## 2024-03-05 MED ORDER — DOCUSATE SODIUM 100 MG PO CAPS
100.0000 mg | ORAL_CAPSULE | Freq: Two times a day (BID) | ORAL | Status: DC
  Administered 2024-03-05 – 2024-03-08 (×6): 100 mg via ORAL
  Filled 2024-03-05 (×6): qty 1

## 2024-03-05 MED ORDER — METHOCARBAMOL 1000 MG/10ML IJ SOLN: 500.0000 mg | Freq: Four times a day (QID) | INTRAMUSCULAR | Status: DC | PRN

## 2024-03-05 MED ORDER — OXYCODONE HCL 5 MG PO TABS: 5.0000 mg | ORAL_TABLET | Freq: Once | ORAL | Status: DC | PRN

## 2024-03-05 MED ORDER — METHOCARBAMOL 500 MG PO TABS: 500.0000 mg | ORAL_TABLET | Freq: Four times a day (QID) | ORAL | Status: DC | PRN

## 2024-03-05 MED ORDER — ONDANSETRON HCL 4 MG/2ML IJ SOLN: 4.0000 mg | Freq: Once | INTRAMUSCULAR | Status: DC | PRN

## 2024-03-05 MED ORDER — FLUTICASONE PROPIONATE 50 MCG/ACT NA SUSP: 1.0000 | Freq: Every day | NASAL | Status: DC | PRN

## 2024-03-05 MED ORDER — ONDANSETRON HCL 4 MG/2ML IJ SOLN
INTRAMUSCULAR | Status: DC | PRN
  Administered 2024-03-05: 4 mg via INTRAVENOUS

## 2024-03-05 MED ORDER — FENTANYL CITRATE (PF) 100 MCG/2ML IJ SOLN
INTRAMUSCULAR | Status: AC
  Filled 2024-03-05: qty 2

## 2024-03-05 MED ORDER — AMLODIPINE BESYLATE 5 MG PO TABS
5.0000 mg | ORAL_TABLET | Freq: Every day | ORAL | Status: DC
  Administered 2024-03-06: 5 mg via ORAL
  Filled 2024-03-05: qty 1

## 2024-03-05 MED ORDER — FENTANYL CITRATE PF 50 MCG/ML IJ SOSY: 25.0000 ug | PREFILLED_SYRINGE | INTRAMUSCULAR | Status: DC | PRN

## 2024-03-05 MED ORDER — CHLORHEXIDINE GLUCONATE 4 % EX SOLN
60.0000 mL | Freq: Once | CUTANEOUS | Status: AC
  Administered 2024-03-05: 4 via TOPICAL
  Filled 2024-03-05: qty 60

## 2024-03-05 MED ORDER — OXYCODONE HCL 5 MG PO TABS: 5.0000 mg | ORAL_TABLET | ORAL | Status: DC | PRN

## 2024-03-05 MED ORDER — DEXAMETHASONE SODIUM PHOSPHATE 4 MG/ML IJ SOLN
INTRAMUSCULAR | Status: DC | PRN
Start: 2024-03-05 — End: 2024-03-05

## 2024-03-05 MED ORDER — ENSURE PRE-SURGERY PO LIQD
296.0000 mL | Freq: Once | ORAL | Status: AC
  Administered 2024-03-05: 296 mL via ORAL
  Filled 2024-03-05: qty 296

## 2024-03-05 MED ORDER — BISACODYL 10 MG RE SUPP: 10.0000 mg | Freq: Every day | RECTAL | Status: DC | PRN

## 2024-03-05 MED ORDER — PHENOL 1.4 % MT LIQD: 1.0000 | OROMUCOSAL | Status: DC | PRN

## 2024-03-05 MED ORDER — TRANEXAMIC ACID-NACL 1000-0.7 MG/100ML-% IV SOLN
INTRAVENOUS | Status: AC
  Filled 2024-03-05: qty 100

## 2024-03-05 MED ORDER — TRAMADOL HCL 50 MG PO TABS
50.0000 mg | ORAL_TABLET | Freq: Four times a day (QID) | ORAL | Status: DC | PRN
  Administered 2024-03-06: 50 mg via ORAL
  Filled 2024-03-05: qty 1

## 2024-03-05 MED ORDER — BUPIVACAINE-EPINEPHRINE (PF) 0.25% -1:200000 IJ SOLN
INTRAMUSCULAR | Status: DC | PRN
Start: 2024-03-05 — End: 2024-03-05
  Administered 2024-03-05: 30 mL

## 2024-03-05 SURGICAL SUPPLY — 35 items
BAG COUNTER SPONGE SURGICOUNT (BAG) IMPLANT
BAG ZIPLOCK 12X15 (MISCELLANEOUS) IMPLANT
BLADE SAG 18X100X1.27 (BLADE) ×2 IMPLANT
COVER PERINEAL POST (MISCELLANEOUS) ×2 IMPLANT
COVER SURGICAL LIGHT HANDLE (MISCELLANEOUS) ×2 IMPLANT
CUP ACET PINNACLE SECTR 50MM (Hips) IMPLANT
DERMABOND ADVANCED .7 DNX12 (GAUZE/BANDAGES/DRESSINGS) ×2 IMPLANT
DRAPE FOOT SWITCH (DRAPES) ×2 IMPLANT
DRAPE STERI IOBAN 125X83 (DRAPES) ×2 IMPLANT
DRAPE U-SHAPE 47X51 STRL (DRAPES) ×4 IMPLANT
DRSG AQUACEL AG ADV 3.5X10 (GAUZE/BANDAGES/DRESSINGS) ×2 IMPLANT
DURAPREP 26ML APPLICATOR (WOUND CARE) ×2 IMPLANT
ELECT REM PT RETURN 15FT ADLT (MISCELLANEOUS) ×2 IMPLANT
GLOVE BIO SURGEON STRL SZ 6.5 (GLOVE) IMPLANT
GLOVE BIO SURGEON STRL SZ7 (GLOVE) IMPLANT
GLOVE BIO SURGEON STRL SZ8 (GLOVE) ×2 IMPLANT
GLOVE BIOGEL PI IND STRL 7.0 (GLOVE) IMPLANT
GLOVE BIOGEL PI IND STRL 8 (GLOVE) ×2 IMPLANT
GOWN STRL REUS W/ TWL LRG LVL3 (GOWN DISPOSABLE) ×4 IMPLANT
HEAD FEM STD 32X+1 STRL (Hips) IMPLANT
HOLDER FOLEY CATH W/STRAP (MISCELLANEOUS) ×2 IMPLANT
KIT TURNOVER KIT A (KITS) IMPLANT
LINER ACET PNNCL PLUS4 NEUTRAL (Hips) IMPLANT
MANIFOLD NEPTUNE II (INSTRUMENTS) ×2 IMPLANT
PACK ANTERIOR HIP CUSTOM (KITS) ×2 IMPLANT
PENCIL SMOKE EVACUATOR COATED (MISCELLANEOUS) ×2 IMPLANT
SPIKE FLUID TRANSFER (MISCELLANEOUS) ×2 IMPLANT
STEM FEMORAL SZ 6MM STD ACTIS (Stem) IMPLANT
SUT ETHIBOND NAB CT1 #1 30IN (SUTURE) ×2 IMPLANT
SUT MNCRL AB 4-0 PS2 18 (SUTURE) ×2 IMPLANT
SUT VIC AB 2-0 CT1 TAPERPNT 27 (SUTURE) ×4 IMPLANT
SUTURE STRATFX 0 PDS 27 VIOLET (SUTURE) ×2 IMPLANT
TOWEL GREEN STERILE FF (TOWEL DISPOSABLE) ×2 IMPLANT
TRAY FOLEY MTR SLVR 16FR STAT (SET/KITS/TRAYS/PACK) ×2 IMPLANT
TUBE SUCTION HIGH CAP CLEAR NV (SUCTIONS) ×2 IMPLANT

## 2024-03-05 NOTE — TOC Initial Note (Addendum)
 Transition of Care [facility]) - Initial/Assessment Note    Patient Details  Name: Megan Reilly MRN: [id number] Date of Birth: [birth year]  [facility]) CM/SW Contact:    Bari Leys, RN Phone Number: 03/05/2024, 9:52 AM  Clinical Narrative:  Patient admitted from home s/p fall with  left hip fx, to OR today for Left Hip Arthroplasty. PT eval pending. TOC will continue to follow.                     Barriers to Discharge: Continued Medical Work up   Patient Goals and CMS Choice            Expected Discharge Plan and Services                                              Prior Living Arrangements/Services                       Activities of Daily Living   ADL Screening (condition at time of admission) Independently performs ADLs?: Yes (appropriate for developmental age) Is the patient deaf or have difficulty hearing?: Yes Does the patient have difficulty seeing, even when wearing glasses/contacts?: No Does the patient have difficulty concentrating, remembering, or making decisions?: No  Permission Sought/Granted                  Emotional Assessment              Admission diagnosis:  Closed left hip fracture (HCC) [S72.002A] Closed fracture of neck of left femur, initial encounter (HCC) [S72.002A] Closed fracture of left elbow, initial encounter [S42.402A] Patient Active Problem List   Diagnosis Date Noted   Closed left hip fracture (HCC) 03/03/2024   Olecranon fracture, left, closed, initial encounter 03/03/2024   Radial neck fracture 03/03/2024   Anemia 06/19/2023   Primary osteoarthritis of left knee 08/09/2022   Essential hypertension 08/04/2022   Meningiomas, multiple (HCC) 11/18/2020   Hearing loss 10/21/2020   Elevated blood-pressure reading, without diagnosis of hypertension 06/02/2020   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   Stiffness of right knee 03/30/2018   OA (osteoarthritis) of knee  03/27/2018   Vasovagal syncope 12/19/2013   Headache 12/10/2013   Subarachnoid hemorrhage following injury (HCC) 12/09/2013   Subarachnoid bleed (HCC) 12/09/2013   Loss of height 01/30/2013   Back pain, lumbosacral 01/30/2013   Family history of osteoporosis 01/30/2013   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 AT [address] [facility] [address] [place] [address] Phone: [phone] Fax: [phone]     Social Drivers of Health (SDOH) Social History: SDOH Screenings   Food Insecurity: No Food Insecurity (03/04/2024)  Housing: Low Risk  (03/04/2024)  Transportation Needs: No Transportation Needs (03/04/2024)  Utilities: Not At Risk (03/04/2024)  Alcohol Screen: Low Risk  (08/19/2023)  Depression (PHQ2-9): Low Risk  (07/18/2023)  Financial Resource Strain: Low Risk  (07/17/2023)  Physical Activity: Sufficiently Active (07/17/2023)  Social Connections: Moderately Integrated (03/04/2024)  Stress: Stress Concern Present (07/17/2023)  Tobacco Use: Low Risk  (03/04/2024)  Health Literacy: Adequate Health Literacy (07/18/2023)   SDOH Interventions:     Readmission Risk Interventions     No  data to display

## 2024-03-05 NOTE — Interval H&P Note (Signed)
 History and Physical Interval Note:  03/05/2024 9:49 AM  Megan Reilly  has presented today for surgery, with the diagnosis of LEFT HIP FRACTURE.  The various methods of treatment have been discussed with the patient and family. After consideration of risks, benefits and other options for treatment, the patient has consented to  Procedure(s): ARTHROPLASTY, HIP, TOTAL, ANTERIOR APPROACH (Left) as a surgical intervention.  The patient's history has been reviewed, patient examined, no change in status, stable for surgery.  I have reviewed the patient's chart and labs.  Questions were answered to the patient's satisfaction.     Samuel Crock Aavya Shafer

## 2024-03-05 NOTE — Progress Notes (Signed)
 PT Cancellation Note  Patient Details Name: Lakeyn Dokken MRN: [id number] DOB: 01/22/1941   Cancelled Treatment:    Reason Eval/Treat Not Completed: Patient not medically ready  Patient was admitted with left displaced femoral neck fracture and is scheduled for surgery this am. PT to continue to follow and check back for updated orders after surgical intervention.    Nelida Mandarino 03/05/2024, 8:45 AM

## 2024-03-05 NOTE — Progress Notes (Signed)
 PROGRESS NOTE    Megan Reilly  OZH:[id number] DOB: 1940-10-14 DOA: 03/03/2024 PCP: Marquetta Sit, MD   Chief Complaint  Patient presents with   Fall    Brief Narrative:  Patient 83 year old female history of subarachnoid hemorrhage secondary to a fall back in 2015, knee replacement, was ambulating on day of admission when she fell while walking, denied any syncopal or prodromal symptoms and developed left hip and elbow pain.  Patient stated her legs just gave out from her.  Workup in the ED concerning for a commuted displaced olecranon fracture, radial head fracture, displaced left femoral fracture.  Patient admitted.  Patient seen in consultation by orthopedics, and patient for surgical repair of left femoral neck fracture on 03/05/2024   Assessment & Plan:   Principal Problem:   Closed left hip fracture [facility]) Active Problems:   Essential hypertension   Olecranon fracture, left, closed, initial encounter   Radial neck fracture   Fall  #1 left femoral neck fracture, displaced - Secondary to mechanical fall. - Patient seen in consultation by orthopedics who are recommending total hip arthroplasty to be done today, 03/05/2024 per Dr. Beverly Buckler. - Pain management, DVT prophylaxis per orthopedics. - Postop PT/OT.  2.  Left closed olecranon fracture, commuted and displaced/left nondisplaced radial head fracture -Secondary to mechanical fall. - Patient seen in consultation by orthopedics who have discussed with patient and recommending conservative management at this time. - Sling usage for comfort per orthopedics and can WBAT to left upper extremity. -PT/OT. - Per orthopedics.  3.  Fall -Patient denies any prodromal symptoms, no syncopal episodes, noted that her legs gave out. - Patient also stating blood pressure was low close in her fall as she states systolic blood pressures worse in the 90s. - Antihypertensive medications initially held on admission.   - Resume home  regimen Norvasc .  - PT/OT.    4 hypertension -Patient noted that systolic blood pressures were in the 90s after her fall. - Antihypertensive medications held on admission.   - BP elevated this morning however may also have a component of pain.   - Patient noted to be on Norvasc  5 mg daily, Cozaar  50 mg daily prior to admission.   - Will resume home regimen Norvasc  5 mg daily.   - Hold Cozaar .      DVT prophylaxis: Lovenox Code Status: Full Family Communication: Updated patient, no family at bedside.  Disposition: TBD  Status is: Inpatient Remains inpatient appropriate because: Severity of illness   Consultants:  Orthopedics: Dr. Hiram Lukes 03/04/2024  Procedures: CT head/CT C-spine 03/03/2024 Plain films of the left elbow 03/03/2024 Plain films of the left femur 03/03/2024 Plain films of the pelvis 03/03/2024   Antimicrobials:  Anti-infectives (From admission, onward)    None         Subjective: Patient laying in preop area.  Denies any chest pain or shortness of breath.  No abdominal pain.  Still with some left hip pain.  Patient denies any significant left upper extremity pain.  Complaining of thirst.    Objective: Vitals:   03/04/24 1343 03/04/24 2133 03/05/24 0610 03/05/24 0850  BP: 138/70 133/85 (!) 140/73 (!) 156/67  Pulse: 71 87 80 77  Resp: 18 18 18 16   Temp: 98.2 F (36.8 C) 98.1 F (36.7 C) 97.8 F (36.6 C) 99 F (37.2 C)  TempSrc: Oral Oral Oral   SpO2: 91% 92% 95% 100%  Weight:      Height:  Intake/Output Summary (Last 24 hours) at 03/05/2024 1003 Last data filed at 03/05/2024 0743 Gross per 24 hour  Intake 2070.27 ml  Output 1000 ml  Net 1070.27 ml   Filed Weights   03/03/24 1945  Weight: 71.6 kg    Examination:  General exam: NAD. Respiratory system: Lungs clear to auscultation bilaterally anterior lung fields.  No wheezes, no crackles, no rhonchi.  Fair air movement.  Speaking in full sentences.  Cardiovascular system: Regular  rate rhythm no murmurs rubs or gallops.  No JVD.  No lower extremity edema.  Gastrointestinal system: Abdomen is soft, nontender, nondistended, positive bowel sounds.  No rebound.  No guarding.   Central nervous system: Alert and oriented. No focal neurological deficits. Extremities: Left hip with ice.  Some tenderness to palpation on the left hip.  Left lower extremity externally rotated and slightly shortened.  Left upper extremity with some ecchymosis around elbow region and some slight swelling. Skin: No rashes, lesions or ulcers Psychiatry: Judgement and insight appear normal. Mood & affect appropriate.     Data Reviewed: I have personally reviewed following labs and imaging studies  CBC: Recent Labs  Lab 03/03/24 1318 03/04/24 0328 03/05/24 0344  WBC 12.4* 10.3 9.5  NEUTROABS 9.7*  --   --   HGB 13.8 11.2* 10.9*  HCT 41.7 34.9* 33.8*  MCV 95.6 97.2 98.5  PLT 168 152 128*    Basic Metabolic Panel: Recent Labs  Lab 03/03/24 1318 03/04/24 0328 03/05/24 0344  NA 138 136 134*  K 4.2 4.5 3.9  CL 104 104 105  CO2 20* 24 22  GLUCOSE 114* 165* 224*  BUN 20 28* 21  CREATININE 1.02* 0.95 0.99  CALCIUM 10.8* 9.7 8.7*    GFR: Estimated Creatinine Clearance: 43.4 mL/min (by C-G formula based on SCr of 0.99 mg/dL).  Liver Function Tests: Recent Labs  Lab 03/03/24 1318  AST 31  ALT 20  ALKPHOS 59  BILITOT 0.7  PROT 6.4*  ALBUMIN 4.2    CBG: No results for input(s): "GLUCAP" in the last 168 hours.   Recent Results (from the past 240 hours)  Surgical pcr screen     Status: None   Collection Time: 03/04/24  5:00 AM   Specimen: Nasal Mucosa; Nasal Swab  Result Value Ref Range Status   MRSA, PCR NEGATIVE NEGATIVE Final   Staphylococcus aureus NEGATIVE NEGATIVE Final    Comment: (NOTE) The Xpert SA Assay (FDA approved for NASAL specimens in patients 69 years of age and older), is one component of a comprehensive surveillance program. It is not intended to  diagnose infection nor to guide or monitor treatment. Performed at [facility], [address], [place], [place] [address]          Radiology Studies: DG Femur Min 2 Views Left Result Date: 03/03/2024 CLINICAL DATA:  Left hip pain after fall. EXAM: LEFT FEMUR 2 VIEWS; PELVIS - 1-2 VIEW COMPARISON:  None Available. FINDINGS: Femur: Displaced femoral neck fracture. There is proximal migration of the femoral shaft with apex lateral angulation. The distal femur is intact. Previous knee arthroplasty. No focal bone lesion or erosive change. Pelvis: No additional fracture of the pelvis. Pubic rami are intact. Pubic symphysis and sacroiliac joints are congruent. Milder underlying bilateral hip osteoarthritis. IMPRESSION: Displaced left femoral neck fracture. Electronically Signed   By: Chadwick Colonel M.D.   On: 03/03/2024 14:27   DG Pelvis 1-2 Views Result Date: 03/03/2024 CLINICAL DATA:  Left hip pain after fall.  EXAM: LEFT FEMUR 2 VIEWS; PELVIS - 1-2 VIEW COMPARISON:  None Available. FINDINGS: Femur: Displaced femoral neck fracture. There is proximal migration of the femoral shaft with apex lateral angulation. The distal femur is intact. Previous knee arthroplasty. No focal bone lesion or erosive change. Pelvis: No additional fracture of the pelvis. Pubic rami are intact. Pubic symphysis and sacroiliac joints are congruent. Milder underlying bilateral hip osteoarthritis. IMPRESSION: Displaced left femoral neck fracture. Electronically Signed   By: Chadwick Colonel M.D.   On: 03/03/2024 14:27   DG Elbow Complete Left Result Date: 03/03/2024 CLINICAL DATA:  Pain after fall.  Bruising and swelling. EXAM: LEFT ELBOW - COMPLETE 3+ VIEW COMPARISON:  None Available. FINDINGS: Comminuted and displaced olecranon fracture. There is osseous distraction of at least 14 mm at the articular surface. Potential nondisplaced fracture of the radial neck. Mild degenerative spurring. There is a  moderate joint effusion. Posterior soft tissue edema. IMPRESSION: 1. Comminuted and displaced olecranon fracture with osseous distraction of at least 14 mm at the articular surface. 2. Potential nondisplaced fracture of the radial neck. Electronically Signed   By: Chadwick Colonel M.D.   On: 03/03/2024 14:26   CT Cervical Spine Wo Contrast Result Date: 03/03/2024 EXAM: CT CERVICAL SPINE WITHOUT CONTRAST 03/03/2024 01:51:43 PM TECHNIQUE: CT of the cervical was performed without the administration of intravenous contrast. Multiplanar reformatted images are provided for review. Automated exposure control, iterative reconstruction, and/or weight based adjustment of the mA/kV was utilized to reduce the radiation dose to as low as reasonably achievable. COMPARISON: None available. CLINICAL HISTORY: Neck trauma (Age >= 65y).  Fall. FINDINGS: CERVICAL SPINE: BONES/ALIGNMENT: Chronic changes at the dens suggest rheumatoid arthritis. Grade 1 degenerative anterolisthesis is present at C7-T1. Skin degenerative anterolisthesis is present at C3-4. DEGENERATIVE CHANGES: Chronic loss of disc height and uncovertebral spine is present at C4-5, C5-6, and C6-7. SOFT TISSUES: There is no prevertebral soft tissue swelling. IMPRESSION: 1. No acute abnormality of the cervical spine related to the reported neck trauma. 2. 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 Electronically signed by: Audree Leas MD 03/03/2024 02:19 PM EDT RP Workstation: [id number]   CT Head Wo Contrast Result Date: 03/03/2024 EXAM: CT HEAD WITHOUT 03/03/2024 01:51:43 PM TECHNIQUE: CT of the head was performed without the administration of intravenous contrast. Automated exposure control, iterative reconstruction, and/or weight based adjustment of the mA/kV was utilized to reduce the radiation dose to as low as reasonably achievable. COMPARISON: MR head without and with contrast 05/18/2023. CLINICAL HISTORY: Head trauma, minor (Age >=  65y). Non con. Fall. FINDINGS: BRAIN AND VENTRICLES: There is no acute intracranial hemorrhage, mass effect or midline shift. No abnormal extra-axial fluid collection. The gray-white differentiation is maintained without evidence of an acute infarct. The ventricles are proportionate to the degree of atrophy. Paraventricular and subcortical white matter hypoattenuation is moderately advanced for age, stable. ORBITS: Bilateral lens replacements are present. The globes and orbits are otherwise within normal limits. SINUSES: The visualized paranasal sinuses and mastoid air cells demonstrate no acute abnormality. 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 A second meningioma measuring 6 mm. No acute abnormality of the visualized skull or soft tissues. VASCULATURE: Atherosclerotic calcifications are present in the cavernous carotid arteries bilaterally and at the dural margin of both vertebral arteries. No hyperdense vessel is present. IMPRESSION: 1. No acute intracranial abnormality related to the minor head trauma. 2. 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 3. Moderately advanced paraventricular and subcortical white matter hypoattenuation for age, stable. 4. Atherosclerosis. Electronically signed by: Audree Leas MD 03/03/2024 02:16 PM EDT RP Workstation: [id number]        Scheduled Meds:  calcium-vitamin D   0.5 tablet Oral Q breakfast   cholecalciferol  2,000 Units Oral Daily   enoxaparin (LOVENOX) injection  40 mg Subcutaneous QHS   latanoprost   1 drop Both Eyes QHS   magnesium oxide  400 mg Oral Daily   polyethylene glycol  17 g Oral BID   Continuous Infusions:     LOS: 2 days    Time spent: 40 minutes    Hilda Lovings, MD [facility]   To contact the attending provider between 7A-7P or the covering provider during after  hours 7P-7A, please log into the web site [email or web address] and access using universal [place] password for that web site. If you do not have the password, please call the hospital operator.  03/05/2024, 10:03 AM

## 2024-03-05 NOTE — Op Note (Addendum)
 OPERATIVE REPORT- TOTAL HIP ARTHROPLASTY   PREOPERATIVE DIAGNOSIS: Displaced femoral neck fracture Left hip.   POSTOPERATIVE DIAGNOSIS: Displaced femoral neck fracture Left hip.   PROCEDURE: Left total hip arthroplasty, anterior approach.   SURGEON: Liliane Rei, MD   ASSISTANT: Brinton Canavan, PA-C  ANESTHESIA:  General  ESTIMATED BLOOD LOSS:-300 mL    DRAINS: None  COMPLICATIONS: None   CONDITION: PACU - hemodynamically stable.   BRIEF CLINICAL NOTE: Megan Reilly is a 83 y.o. female who fell 2 days ago sustaining a displaced Left femoral neck fracture.She has been cleared medically and presents for  total hip arthroplasty.   PROCEDURE IN DETAIL: After successful administration of spinal  anesthetic, the traction boots for the [facility] bed were placed on both  feet and the patient was placed onto the [facility] bed, boots placed into the leg holders. The Left hip was then isolated from the perineum with plastic  drapes and prepped and draped in the usual sterile fashion. ASIS and  greater trochanter were marked and a oblique incision was made, starting  at about 1 cm lateral and 2 cm distal to the ASIS and coursing towards  the anterior cortex of the femur. The skin was cut with a 10 blade  through subcutaneous tissue to the level of the fascia overlying the  tensor fascia lata muscle. The fascia was then incised in line with the  incision at the junction of the anterior third and posterior 2/3rd. The  muscle was teased off the fascia and then the interval between the TFL  and the rectus was developed. The Hohmann retractor was then placed at  the top of the femoral neck over the capsule. The vessels overlying the  capsule were cauterized and the fat on top of the capsule was removed.  A Hohmann retractor was then placed anterior underneath the rectus  femoris to give exposure to the entire anterior capsule. A T-shaped  capsulotomy was performed. The edges were tagged  and the femoral head  was identified.       Osteophytes are removed off the superior acetabulum.  The femoral neck was then cut in situ with an oscillating saw. Traction  was then applied to the left lower extremity utilizing the [facility]  traction. The femoral head was then removed. Retractors were placed  around the acetabulum and then circumferential removal of the labrum was  performed. Osteophytes were also removed. Reaming starts at 47 mm to  medialize and  Increased in 2 mm increments to 49 mm. We reamed in  approximately 40 degrees of abduction, 20 degrees anteversion. A 50 mm  pinnacle acetabular shell was then impacted in anatomic position under  fluoroscopic guidance with excellent purchase. We did not need to place  any additional dome screws. A 32 mm neutral + 4 marathon liner was then  placed into the acetabular shell.       The femoral lift was then placed along the lateral aspect of the femur  just distal to the vastus ridge. The leg was  externally rotated and capsule  was stripped off the inferior aspect of the femoral neck down to the  level of the lesser trochanter, this was done with electrocautery. The femur was lifted after this was performed. The  leg was then placed in an extended and adducted position essentially delivering the femur. We also removed the capsule superiorly and the piriformis from the piriformis fossa to gain excellent exposure of the proximal femur. Rongeur  was used to remove some cancellous bone to get into the lateral portion of the proximal femur for placement of the initial starter reamer. The starter broaches was placed  the starter broach  and was shown to go down the center of the canal. Broaching with the Actis system was then performed starting at size 0  coursing Up to size 6. A size 6 had excellent torsional and rotational  and axial stability. The trial standard offset neck was then placed  with a 32 + 1 trial head. The hip was then reduced. We  confirmed that  the stem was in the canal both on AP and lateral x-rays. It also has excellent sizing. The hip was reduced with outstanding stability through full extension and full external rotation.. AP pelvis was taken and the leg lengths were measured and found to be equal. Hip was then dislocated again and the femoral head and neck removed. The  femoral broach was removed. Size 6 Actis stem with a standard offset  neck was then impacted into the femur following native anteversion. Has  excellent purchase in the canal. Excellent torsional and rotational and  axial stability. It is confirmed to be in the canal on AP and lateral  fluoroscopic views. The 32 + 1 metal head was placed and the hip  reduced with outstanding stability. Again AP pelvis was taken and it  confirmed that the leg lengths were equal. The wound was then copiously  irrigated with saline solution and the capsule reattached and repaired  with Ethibond suture. 30 ml of .25% Bupivicaine was  injected into the capsule and into the edge of the tensor fascia lata as well as subcutaneous tissue. The fascia overlying the tensor fascia lata was then closed with a running #1 V-Loc. Subcu was closed with interrupted 2-0 Vicryl and subcuticular running 4-0 Monocryl. Incision was cleaned  and dried. Steri-Strips and a bulky sterile dressing applied. The patient was awakened and transported to  recovery in stable condition.        Please note that a surgical assistant was a medical necessity for this procedure to perform it in a safe and expeditious manner. Assistant was necessary to provide appropriate retraction of vital neurovascular structures and to prevent femoral fracture and allow for anatomic placement of the prosthesis.  Liliane Rei, M.D.

## 2024-03-05 NOTE — Anesthesia Procedure Notes (Signed)
 Procedure Name: LMA Insertion Date/Time: 03/05/2024 10:36 AM  Performed by: Rochell Chroman, CRNAPre-anesthesia Checklist: Emergency Drugs available, Patient identified, Suction available and Patient being monitored Patient Re-evaluated:Patient Re-evaluated prior to induction Oxygen Delivery Method: Circle system utilized Preoxygenation: Pre-oxygenation with 100% oxygen Induction Type: IV induction Ventilation: Mask ventilation without difficulty LMA: LMA inserted LMA Size: 4.0 Number of attempts: 1 Placement Confirmation: positive ETCO2 and breath sounds checked- equal and bilateral Tube secured with: Tape Dental Injury: Teeth and Oropharynx as per pre-operative assessment

## 2024-03-05 NOTE — Transfer of Care (Signed)
 Immediate Anesthesia Transfer of Care Note  Patient: Megan Reilly  Procedure(s) Performed: ARTHROPLASTY, HIP, TOTAL, ANTERIOR APPROACH (Left: Hip)  Patient Location: PACU  Anesthesia Type:General  Level of Consciousness: awake and patient cooperative  Airway & Oxygen Therapy: Patient Spontanous Breathing and Patient connected to face mask  Post-op Assessment: Report given to RN and Post -op Vital signs reviewed and stable  Post vital signs: Reviewed and stable  Last Vitals:  Vitals Value Taken Time  BP 132/63 03/05/24 1157  Temp    Pulse 82 03/05/24 1158  Resp 14 03/05/24 1158  SpO2 93 % 03/05/24 1158  Vitals shown include unfiled device data.  Last Pain:  Vitals:   03/05/24 0850  TempSrc:   PainSc: 3          Complications: No notable events documented.

## 2024-03-05 NOTE — Progress Notes (Signed)
 OT Cancellation Note  Patient Details Name: Megan Reilly MRN: [id number] DOB: 03-30-1941   Cancelled Treatment:    Reason Eval/Treat Not Completed: Other (comment) Patient was admitted with left displaced femoral neck fracture and is scheduled for surgery this AM. OT to continue to follow and check back for updated orders after surgical intervention.  Wynette Heckler, MS Acute Rehabilitation Department Office# [phone]  03/05/2024, 6:59 AM

## 2024-03-05 NOTE — Discharge Instructions (Signed)
Megan Gross, MD Total Joint Specialist [facility] [address], [address] [place], [place] [address] [phone]  ANTERIOR APPROACH TOTAL HIP REPLACEMENT POSTOPERATIVE DIRECTIONS     Hip Rehabilitation, Guidelines Following Surgery  The results of a hip operation are greatly improved after range of motion and muscle strengthening exercises. Follow all safety measures which are given to protect your hip. If any of these exercises cause increased pain or swelling in your joint, decrease the amount until you are comfortable again. Then slowly increase the exercises. Call your caregiver if you have problems or questions.   BLOOD CLOT PREVENTION Take an 81 mg Aspirin two times a day for three weeks following surgery. Then take an 81 mg Aspirin once a day for three weeks. Then discontinue Aspirin. You may resume your vitamins/supplements upon discharge from the hospital. Do not take any NSAIDs (Advil, Aleve, Ibuprofen, Meloxicam, etc.) until you have discontinued the 81 mg Aspirin twice a day.  HOME CARE INSTRUCTIONS  Remove items at home which could result in a fall. This includes throw rugs or furniture in walking pathways.  ICE to the affected hip as frequently as 20-30 minutes an hour and then as needed for pain and swelling. Continue to use ice on the hip for pain and swelling from surgery. You may notice swelling that will progress down to the foot and ankle. This is normal after surgery. Elevate the leg when you are not up walking on it.   Continue to use the breathing machine which will help keep your temperature down.  It is common for your temperature to cycle up and down following surgery, especially at night when you are not up moving around and exerting yourself.  The breathing machine keeps your lungs expanded and your temperature down.  DIET You may resume your previous home diet once your are discharged from the hospital.  DRESSING / WOUND CARE /  SHOWERING You have an adhesive waterproof bandage over the incision. Leave this in place until your first follow-up appointment. Once you remove this you will not need to place another bandage.  You may begin showering 3 days following surgery, but do not submerge the incision under water.  ACTIVITY For the first 3-5 days, it is important to rest and keep the operative leg elevated. You should, as a general rule, rest for 50 minutes and walk/stretch for 10 minutes per hour. After 5 days, you may slowly increase activity as tolerated.  Perform the exercises you were provided twice a day for about 15-20 minutes each session. Begin these 2 days following surgery. Walk with your walker as instructed. Use the walker until you are comfortable transitioning to a cane. Walk with the cane in the opposite hand of the operative leg. You may discontinue the cane once you are comfortable and walking steadily. Avoid periods of inactivity such as sitting longer than an hour when not asleep. This helps prevent blood clots.  Do not drive a car for 6 weeks or until released by your surgeon.  Do not drive while taking narcotics.  TED HOSE STOCKINGS Wear the elastic stockings on both legs for three weeks following surgery during the day. You may remove them at night while sleeping.  WEIGHT BEARING Weight bearing as tolerated with assist device (walker, cane, etc) as directed, use it as long as suggested by your surgeon or therapist, typically at least 4-6 weeks.  POSTOPERATIVE CONSTIPATION PROTOCOL Constipation - defined medically as fewer than three stools per week and  severe constipation as less than one stool per week.  One of the most common issues patients have following surgery is constipation.  Even if you have a regular bowel pattern at home, your normal regimen is likely to be disrupted due to multiple reasons following surgery.  Combination of anesthesia, postoperative narcotics, change in appetite and  fluid intake all can affect your bowels.  In order to avoid complications following surgery, here are some recommendations in order to help you during your recovery period.  Colace (docusate) - Pick up an over-the-counter form of Colace or another stool softener and take twice a day as long as you are requiring postoperative pain medications.  Take with a full glass of water daily.  If you experience loose stools or diarrhea, hold the colace until you stool forms back up.  If your symptoms do not get better within 1 week or if they get worse, check with your doctor. Dulcolax (bisacodyl) - Pick up over-the-counter and take as directed by the product packaging as needed to assist with the movement of your bowels.  Take with a full glass of water.  Use this product as needed if not relieved by Colace only.  MiraLax (polyethylene glycol) - Pick up over-the-counter to have on hand.  MiraLax is a solution that will increase the amount of water in your bowels to assist with bowel movements.  Take as directed and can mix with a glass of water, juice, soda, coffee, or tea.  Take if you go more than two days without a movement.Do not use MiraLax more than once per day. Call your doctor if you are still constipated or irregular after using this medication for 7 days in a row.  If you continue to have problems with postoperative constipation, please contact the office for further assistance and recommendations.  If you experience "the worst abdominal pain ever" or develop nausea or vomiting, please contact the office immediatly for further recommendations for treatment.  ITCHING  If you experience itching with your medications, try taking only a single pain pill, or even half a pain pill at a time.  You can also use Benadryl over the counter for itching or also to help with sleep.   MEDICATIONS See your medication summary on the "After Visit Summary" that the nursing staff will review with you prior to discharge.   You may have some home medications which will be placed on hold until you complete the course of blood thinner medication.  It is important for you to complete the blood thinner medication as prescribed by your surgeon.  Continue your approved medications as instructed at time of discharge.  PRECAUTIONS If you experience chest pain or shortness of breath - call 911 immediately for transfer to the hospital emergency department.  If you develop a fever greater that 101 F, purulent drainage from wound, increased redness or drainage from wound, foul odor from the wound/dressing, or calf pain - CONTACT YOUR SURGEON.                                                   FOLLOW-UP APPOINTMENTS Make sure you keep all of your appointments after your operation with your surgeon and caregivers. You should call the office at the above phone number and make an appointment for approximately two weeks after the date of your surgery  or on the date instructed by your surgeon outlined in the "After Visit Summary".  RANGE OF MOTION AND STRENGTHENING EXERCISES  These exercises are designed to help you keep full movement of your hip joint. Follow your caregiver's or physical therapist's instructions. Perform all exercises about fifteen times, three times per day or as directed. Exercise both hips, even if you have had only one joint replacement. These exercises can be done on a training (exercise) mat, on the floor, on a table or on a bed. Use whatever works the best and is most comfortable for you. Use music or television while you are exercising so that the exercises are a pleasant break in your day. This will make your life better with the exercises acting as a break in routine you can look forward to.  Lying on your back, slowly slide your foot toward your buttocks, raising your knee up off the floor. Then slowly slide your foot back down until your leg is straight again.  Lying on your back spread your legs as far apart as  you can without causing discomfort.  Lying on your side, raise your upper leg and foot straight up from the floor as far as is comfortable. Slowly lower the leg and repeat.  Lying on your back, tighten up the muscle in the front of your thigh (quadriceps muscles). You can do this by keeping your leg straight and trying to raise your heel off the floor. This helps strengthen the largest muscle supporting your knee.  Lying on your back, tighten up the muscles of your buttocks both with the legs straight and with the knee bent at a comfortable angle while keeping your heel on the floor.   POST-OPERATIVE OPIOID TAPER INSTRUCTIONS: It is important to wean off of your opioid medication as soon as possible. If you do not need pain medication after your surgery it is ok to stop day one. Opioids include: Codeine, Hydrocodone(Norco, Vicodin), Oxycodone(Percocet, oxycontin) and hydromorphone amongst others.  Long term and even short term use of opiods can cause: Increased pain response Dependence Constipation Depression Respiratory depression And more.  Withdrawal symptoms can include Flu like symptoms Nausea, vomiting And more Techniques to manage these symptoms Hydrate well Eat regular healthy meals Stay active Use relaxation techniques(deep breathing, meditating, yoga) Do Not substitute Alcohol to help with tapering If you have been on opioids for less than two weeks and do not have pain than it is ok to stop all together.  Plan to wean off of opioids This plan should start within one week post op of your joint replacement. Maintain the same interval or time between taking each dose and first decrease the dose.  Cut the total daily intake of opioids by one tablet each day Next start to increase the time between doses. The last dose that should be eliminated is the evening dose.   IF YOU ARE TRANSFERRED TO A SKILLED REHAB FACILITY If the patient is transferred to a skilled rehab facility  following release from the hospital, a list of the current medications will be sent to the facility for the patient to continue.  When discharged from the skilled rehab facility, please have the facility set up the patient's Home Health Physical Therapy prior to being released. Also, the skilled facility will be responsible for providing the patient with their medications at time of release from the facility to include their pain medication, the muscle relaxants, and their blood thinner medication. If the patient is still at  the rehab facility at time of the two week follow up appointment, the skilled rehab facility will also need to assist the patient in arranging follow up appointment in our office and any transportation needs.  MAKE SURE YOU:  Understand these instructions.  Get help right away if you are not doing well or get worse.    DENTAL ANTIBIOTICS:  In most cases prophylactic antibiotics for Dental procdeures after total joint surgery are not necessary.  Exceptions are as follows:  1. History of prior total joint infection  2. Severely immunocompromised (Organ Transplant, cancer chemotherapy, Rheumatoid biologic meds such as Humera)  3. Poorly controlled diabetes (A1C &gt; 8.0, blood glucose over 200)  If you have one of these conditions, contact your surgeon for an antibiotic prescription, prior to your dental procedure.    Pick up stool softner and laxative for home use following surgery while on pain medications. Do not submerge incision under water. Please use good hand washing techniques while changing dressing each day. May shower starting three days after surgery. Please use a clean towel to pat the incision dry following showers. Continue to use ice for pain and swelling after surgery. Do not use any lotions or creams on the incision until instructed by your surgeon.

## 2024-03-05 NOTE — Plan of Care (Signed)
 Problem: Education: Goal: Knowledge of General Education information will improve Description: Including pain rating scale, medication(s)/side effects and non-pharmacologic comfort measures Outcome: Progressing   Problem: Clinical Measurements: Goal: Ability to maintain clinical measurements within normal limits will improve Outcome: Progressing   Problem: Activity: Goal: Risk for activity intolerance will decrease Outcome: Progressing   Problem: Pain Managment: Goal: General experience of comfort will improve and/or be controlled Outcome: Progressing   Problem: Safety: Goal: Ability to remain free from injury will improve Outcome: Progressing   Ara Knee, RN 03/05/24 5:35 PM

## 2024-03-05 NOTE — Plan of Care (Signed)

## 2024-03-05 NOTE — Anesthesia Preprocedure Evaluation (Addendum)
 Anesthesia Evaluation  Patient identified by MRN, date of birth, ID band Patient awake    Reviewed: Allergy & Precautions, NPO status , Patient's Chart, lab work & pertinent test results  Airway Mallampati: II  TM Distance: >3 FB Neck ROM: Full    Dental  (+) Teeth Intact, Dental Advisory Given, Caps   Pulmonary  Snores at night   Pulmonary exam normal breath sounds clear to auscultation       Cardiovascular hypertension, Pt. on medications Normal cardiovascular exam+ Valvular Problems/Murmurs (mild MR) MR  Rhythm:Regular Rate:Normal  Echo 2015 - Left ventricle: The cavity size was normal. Systolic    function was normal. The estimated ejection fraction was    in the range of 55% to 60%. Wall motion was normal; there    were no regional wall motion abnormalities.  - Mitral valve: Mild regurgitation.  - Atrial septum: No defect or patent foramen ovale was    identified.  - Pulmonary arteries: PA peak pressure: 32mm Hg (S).     Neuro/Psych  Headaches  negative psych ROS   GI/Hepatic negative GI ROS, Neg liver ROS,,,  Endo/Other  negative endocrine ROS    Renal/GU negative Renal ROS  negative genitourinary   Musculoskeletal  (+) Arthritis , Osteoarthritis,    Abdominal   Peds  Hematology negative hematology ROS (+) Blood dyscrasia, anemia   Anesthesia Other Findings   Reproductive/Obstetrics negative OB ROS                             Anesthesia Physical Anesthesia Plan  ASA: 3  Anesthesia Plan: General   Post-op Pain Management: Tylenol  PO (pre-op)* and Celebrex PO (pre-op)*   Induction: Intravenous  PONV Risk Score and Plan: 3 and Ondansetron , Dexamethasone , Midazolam  and Treatment may vary due to age or medical condition  Airway Management Planned: Oral ETT and LMA  Additional Equipment: None  Intra-op Plan:   Post-operative Plan: Extubation in OR  Informed Consent: I  have reviewed the patients History and Physical, chart, labs and discussed the procedure including the risks, benefits and alternatives for the proposed anesthesia with the patient or authorized representative who has indicated his/her understanding and acceptance.     Dental advisory given  Plan Discussed with: CRNA and Anesthesiologist  Anesthesia Plan Comments:        Anesthesia Quick Evaluation

## 2024-03-05 NOTE — Plan of Care (Signed)
  Problem: Coping: Goal: Level of anxiety will decrease Outcome: Progressing   Problem: Pain Managment: Goal: General experience of comfort will improve and/or be controlled Outcome: Progressing   Problem: Safety: Goal: Ability to remain free from injury will improve Outcome: Progressing   Problem: Elimination: Goal: Will not experience complications related to bowel motility Outcome: Progressing Goal: Will not experience complications related to urinary retention Outcome: Progressing

## 2024-03-06 ENCOUNTER — Encounter (HOSPITAL_COMMUNITY): Payer: Self-pay | Admitting: Orthopedic Surgery

## 2024-03-06 DIAGNOSIS — I10 Essential (primary) hypertension: Secondary | ICD-10-CM | POA: Diagnosis not present

## 2024-03-06 DIAGNOSIS — S52022A Displaced fracture of olecranon process without intraarticular extension of left ulna, initial encounter for closed fracture: Secondary | ICD-10-CM | POA: Diagnosis not present

## 2024-03-06 DIAGNOSIS — S52135D Nondisplaced fracture of neck of left radius, subsequent encounter for closed fracture with routine healing: Secondary | ICD-10-CM | POA: Diagnosis not present

## 2024-03-06 DIAGNOSIS — S72002P Fracture of unspecified part of neck of left femur, subsequent encounter for closed fracture with malunion: Secondary | ICD-10-CM | POA: Diagnosis not present

## 2024-03-06 LAB — BASIC METABOLIC PANEL WITH GFR
Anion gap: 7 (ref 5–15)
BUN: 19 mg/dL (ref 8–23)
CO2: 21 mmol/L — ABNORMAL LOW (ref 22–32)
Calcium: 8.8 mg/dL — ABNORMAL LOW (ref 8.9–10.3)
Chloride: 105 mmol/L (ref 98–111)
Creatinine, Ser: 0.85 mg/dL (ref 0.44–1.00)
GFR, Estimated: >60 mL/min (ref >60)
Glucose, Bld: 144 mg/dL — ABNORMAL HIGH (ref 70–99)
Potassium: 4.4 mmol/L (ref 3.5–5.1)
Sodium: 133 mmol/L — ABNORMAL LOW (ref 135–145)

## 2024-03-06 LAB — CBC
HCT: 30.9 % — ABNORMAL LOW (ref 36.0–46.0)
Hemoglobin: 10 g/dL — ABNORMAL LOW (ref 12.0–15.0)
MCH: 32.2 pg (ref 26.0–34.0)
MCHC: 32.4 g/dL (ref 30.0–36.0)
MCV: 99.4 fL (ref 80.0–100.0)
Platelets: 118 10*3/uL — ABNORMAL LOW (ref 150–400)
RBC: 3.11 MIL/uL — ABNORMAL LOW (ref 3.87–5.11)
RDW: 12.8 % (ref 11.5–15.5)
WBC: 11.8 10*3/uL — ABNORMAL HIGH (ref 4.0–10.5)
nRBC: 0 % (ref 0.0–0.2)

## 2024-03-06 MED ORDER — AMLODIPINE BESYLATE 5 MG PO TABS
2.5000 mg | ORAL_TABLET | Freq: Every day | ORAL | Status: DC
  Administered 2024-03-07 – 2024-03-08 (×2): 2.5 mg via ORAL
  Filled 2024-03-06 (×2): qty 1

## 2024-03-06 NOTE — TOC Progression Note (Addendum)
 Transition of Care [facility]) - Progression Note    Patient Details  Name: Megan Reilly MRN: [id number] Date of Birth: 03-18-1941  [facility] CM/SW Contact  Bari Leys, RN Phone Number: 03/06/2024, 12:53 PM  Clinical Narrative: PT eval completed, recommendation for short term rehab/SNF, met with pt at bedside to introduce role of TOC/NCM and review for dc planning, pt reports she has an established PCP, no current home care services or home DME, pt reports good support from her family. PT agreeable to short term rehab/SNF, no preference.     -2:50pm FL2 updated , PASRR [id number], faxed out for bed offers.     Expected Discharge Plan: Skilled Nursing Facility Barriers to Discharge: Continued Medical Work up  Expected Discharge Plan and Services       Living arrangements for the past 2 months: Single Family Home                                       Social Determinants of Health (SDOH) Interventions SDOH Screenings   Food Insecurity: No Food Insecurity (03/04/2024)  Housing: Low Risk  (03/04/2024)  Transportation Needs: No Transportation Needs (03/04/2024)  Utilities: Not At Risk (03/04/2024)  Alcohol Screen: Low Risk  (08/19/2023)  Depression (PHQ2-9): Low Risk  (07/18/2023)  Financial Resource Strain: Low Risk  (07/17/2023)  Physical Activity: Sufficiently Active (07/17/2023)  Social Connections: Moderately Integrated (03/04/2024)  Stress: Stress Concern Present (07/17/2023)  Tobacco Use: Low Risk  (03/04/2024)  Health Literacy: Adequate Health Literacy (07/18/2023)    Readmission Risk Interventions    03/06/2024   12:52 PM  Readmission Risk Prevention Plan  Post Dischage Appt Complete  Medication Screening Complete  Transportation Screening Complete

## 2024-03-06 NOTE — Anesthesia Postprocedure Evaluation (Signed)
 Anesthesia Post Note  Patient: Megan Reilly  Procedure(s) Performed: ARTHROPLASTY, HIP, TOTAL, ANTERIOR APPROACH (Left: Hip)     Patient location during evaluation: PACU Anesthesia Type: General Level of consciousness: awake and alert Pain management: pain level controlled Vital Signs Assessment: post-procedure vital signs reviewed and stable Respiratory status: spontaneous breathing, nonlabored ventilation, respiratory function stable and patient connected to nasal cannula oxygen Cardiovascular status: blood pressure returned to baseline and stable Postop Assessment: no apparent nausea or vomiting Anesthetic complications: no   No notable events documented.  Last Vitals:  Vitals:   03/06/24 0911 03/06/24 1412  BP: (!) 115/58 126/68  Pulse: 80 86  Resp: 16 17  Temp: 36.8 C 36.8 C  SpO2: 98% 98%    Last Pain:  Vitals:   03/06/24 1412  TempSrc: Oral  PainSc:                  [name]

## 2024-03-06 NOTE — Plan of Care (Signed)

## 2024-03-06 NOTE — Progress Notes (Signed)
   Subjective: 1 Day Post-Op Procedure(s) (LRB): ARTHROPLASTY, HIP, TOTAL, ANTERIOR APPROACH (Left) Patient seen in rounds by Dr. France Ina. Patient is well, and has had no acute complaints or problems. Denies SOB or chest pain. Denies calf pain. Patient reports pain as moderate. We will start physical therapy today.   Objective: Vital signs in last 24 hours: Temp:  [97.6 F (36.4 C)-99.6 F (37.6 C)] 99.6 F (37.6 C) (05/27 0224) Pulse Rate:  [58-106] 106 (05/27 0706) Resp:  [10-18] 18 (05/27 0706) BP: (103-156)/(49-69) 104/65 (05/27 0706) SpO2:  [91 %-100 %] 96 % (05/27 0706)  Intake/Output from previous day:  Intake/Output Summary (Last 24 hours) at 03/06/2024 0813 Last data filed at 03/06/2024 0600 Gross per 24 hour  Intake 2686.16 ml  Output 1000 ml  Net 1686.16 ml     Intake/Output this shift: No intake/output data recorded.  Labs: Recent Labs    03/03/24 1318 03/04/24 0328 03/05/24 0344 03/06/24 0335  HGB 13.8 11.2* 10.9* 10.0*   Recent Labs    03/05/24 0344 03/06/24 0335  WBC 9.5 11.8*  RBC 3.43* 3.11*  HCT 33.8* 30.9*  PLT 128* 118*   Recent Labs    03/05/24 0344 03/06/24 0335  NA 134* 133*  K 3.9 4.4  CL 105 105  CO2 22 21*  BUN 21 19  CREATININE 0.99 0.85  GLUCOSE 224* 144*  CALCIUM 8.7* 8.8*   No results for input(s): "LABPT", "INR" in the last 72 hours.  Exam: General - Patient is Alert and Oriented Extremity - Neurologically intact Neurovascular intact Sensation intact distally Dorsiflexion/Plantar flexion intact Dressing - dressing C/D/I Motor Function - intact, moving foot and toes well on exam.  Past Medical History:  Diagnosis Date   Allergy    Arthritis    Blood transfusion abn reaction or complication, no procedure mishap    83 years old after tonsilectomy   Hypertension    Osteoporosis 04/2019   T score -2.1 improved from prior DEXA   Tibia fracture    left   Vasovagal syncope    2 prior incidences last was in 2015  ; treated in ED , reports no recurrence since then     Assessment/Plan: 1 Day Post-Op Procedure(s) (LRB): ARTHROPLASTY, HIP, TOTAL, ANTERIOR APPROACH (Left) Principal Problem:   Closed left hip fracture (HCC) Active Problems:   Essential hypertension   Olecranon fracture, left, closed, initial encounter   Radial neck fracture   Fall  Estimated body mass index is 24 kg/m as calculated from the following:   Height as of this encounter: 5\' 8"  (1.727 m).   Weight as of this encounter: 71.6 kg.  DVT Prophylaxis - Aspirin  Weight bearing as tolerated.  Start physical therapy. Patient hopeful to discharge to home rather than SNF either today or tomorrow.  R. Brinton Canavan, PA-C Orthopedic Surgery 03/06/2024, 8:13 AM

## 2024-03-06 NOTE — [place] FL2 (Signed)
 [place]  MEDICAID FL2 LEVEL OF CARE FORM     IDENTIFICATION  Patient Name: Megan Reilly Birthdate: 10/05/1941 Sex: female Admission Date (Current Location): 03/03/2024  [facility] and [place] Number:  Producer, television/film/video and Address:  [facility],  [address] [place], [place] [address]      Provider Number: [id number]  Attending Physician Name and Address:  Armenta Landau, MD  Relative Name and Phone Number:  Dorenda, Pfannenstiel (Daughter)  [phone] [facility])    Current Level of Care: Hospital Recommended Level of Care: Skilled Nursing Facility Prior Approval Number:    Date Approved/Denied:   PASRR Number: [id number]  Discharge Plan: SNF    Current Diagnoses: Patient Active Problem List   Diagnosis Date Noted   Fall 03/05/2024   Closed left hip fracture (HCC) 03/03/2024   Olecranon fracture, left, closed, initial encounter 03/03/2024   Radial neck fracture 03/03/2024   Anemia 06/19/2023   Primary osteoarthritis of left knee 08/09/2022   Essential hypertension 08/04/2022   Meningiomas, multiple (HCC) 11/18/2020   Hearing loss 10/21/2020   Elevated blood-pressure reading, without diagnosis of hypertension 06/02/2020   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   Stiffness of right knee 03/30/2018   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   Vasovagal syncope 12/19/2013   Headache 12/10/2013   Subarachnoid hemorrhage following injury (HCC) 12/09/2013   Subarachnoid bleed (HCC) 12/09/2013   Loss of height 01/30/2013   Back pain, lumbosacral 01/30/2013   Family history of osteoporosis 01/30/2013   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   Osteoporosis 12/20/2012    Orientation RESPIRATION BLADDER Height & Weight     Self, Time, Situation, Place  Normal Incontinent, External catheter Weight: 71.6 kg Height:  5\' 8"  (172.7 cm)  BEHAVIORAL SYMPTOMS/MOOD NEUROLOGICAL BOWEL NUTRITION STATUS      Continent Diet (regular)  AMBULATORY  STATUS COMMUNICATION OF NEEDS Skin   Limited Assist Verbally Surgical wounds (Left THA)                       Personal Care Assistance Level of Assistance  Bathing, Feeding, Dressing Bathing Assistance: Limited assistance Feeding assistance: Limited assistance Dressing Assistance: Limited assistance     Functional Limitations Info  Sight, Hearing, Speech Sight Info: Adequate Hearing Info: Impaired Speech Info: Adequate    SPECIAL CARE FACTORS FREQUENCY  PT (By licensed PT), OT (By licensed OT)     PT Frequency: 5x/wk OT Frequency: 5x/wk            Contractures Contractures Info: Not present    Additional Factors Info  Code Status, Allergies, Psychotropic Code Status Info: Full Code Allergies Info: Diphenhydramine , Hydrochlorothiazide  Psychotropic Info: N/A         Current Medications (03/06/2024):  This is the current hospital active medication list Current Facility-Administered Medications  Medication Dose Route Frequency Provider Last Rate Last Admin   0.9 %  sodium chloride  infusion   Intravenous Continuous Perla Bradford, PA 75 mL/hr at 03/05/24 1435 New Bag at 03/05/24 1435   acetaminophen  (TYLENOL ) tablet 325-650 mg  325-650 mg Oral Q6H PRN Perla Bradford, PA       amLODipine  (NORVASC ) tablet 5 mg  5 mg Oral Daily Perla Bradford, PA   5 mg at 03/06/24 7253   aspirin  chewable tablet 81 mg  81 mg Oral Daily Perla Bradford, PA       bisacodyl  (DULCOLAX) suppository 10 mg  10 mg Rectal Daily PRN Perla Bradford, PA  calcium-vitamin D  (OSCAL WITH D) 500-5 MG-MCG per tablet 0.5 tablet  0.5 tablet Oral Q breakfast Perla Bradford, PA   0.5 tablet at 03/06/24 0808   cholecalciferol (VITAMIN D3) tablet 2,000 Units  2,000 Units Oral Daily Perla Bradford, PA   2,000 Units at 03/06/24 1610   docusate sodium  (COLACE) capsule 100 mg  100 mg Oral BID Perla Bradford, PA   100 mg at 03/06/24 9604   fluticasone  (FLONASE ) 50 MCG/ACT nasal spray 1  spray  1 spray Each Nare Daily PRN Perla Bradford, PA       latanoprost  (XALATAN ) 0.005 % ophthalmic solution 1 drop  1 drop Both Eyes QHS Daniels, James K, NP       magnesium oxide (MAG-OX) tablet 400 mg  400 mg Oral Daily Perla Bradford, PA   400 mg at 03/06/24 5409   melatonin tablet 3 mg  3 mg Oral QHS PRN Perla Bradford, PA       menthol -cetylpyridinium (CEPACOL) lozenge 3 mg  1 lozenge Oral PRN Perla Bradford, PA       Or   phenol (CHLORASEPTIC) mouth spray 1 spray  1 spray Mouth/Throat PRN Perla Bradford, PA       methocarbamol  (ROBAXIN ) tablet 500 mg  500 mg Oral Q6H PRN Perla Bradford, PA       Or   methocarbamol  (ROBAXIN ) injection 500 mg  500 mg Intravenous Q6H PRN Perla Bradford, PA       metoCLOPramide  (REGLAN ) tablet 5-10 mg  5-10 mg Oral Q8H PRN Perla Bradford, PA       Or   metoCLOPramide  (REGLAN ) injection 5-10 mg  5-10 mg Intravenous Q8H PRN Perla Bradford, PA       morphine  (PF) 2 MG/ML injection 1-2 mg  1-2 mg Intravenous Q2H PRN Perla Bradford, PA       ondansetron  (ZOFRAN ) tablet 4 mg  4 mg Oral Q6H PRN Perla Bradford, PA       Or   ondansetron  (ZOFRAN ) injection 4 mg  4 mg Intravenous Q6H PRN Perla Bradford, PA       oxyCODONE  (Oxy IR/ROXICODONE ) immediate release tablet 5-10 mg  5-10 mg Oral Q4H PRN Perla Bradford, PA       polyethylene glycol (MIRALAX  / GLYCOLAX ) packet 17 g  17 g Oral Daily PRN Perla Bradford, PA       polyvinyl alcohol (LIQUIFILM TEARS) 1.4 % ophthalmic solution 1 drop  1 drop Both Eyes TID PRN Perla Bradford, PA       sodium phosphate  (FLEET) enema 1 enema  1 enema Rectal Once PRN Perla Bradford, PA       traMADol  (ULTRAM ) tablet 50-100 mg  50-100 mg Oral Q6H PRN Perla Bradford, PA         Discharge Medications: Please see discharge summary for a list of discharge medications.  Relevant Imaging Results:  Relevant Lab Results:   Additional Information SSN: [id number]  Bari Leys,  RN

## 2024-03-06 NOTE — Evaluation (Signed)
 Occupational Therapy Evaluation Patient Details Name: Megan Reilly MRN: [id number] DOB: Dec 06, 1940 Today's Date: 03/06/2024   History of Present Illness   Pt is an 83 year old female who presented after a fall at home. Patient was found to have L displaced femoral neck fracture and  Left closed olecranon fracture, commuted and displaced/left nondisplaced radial head fracture. LUE is being managed conservatively with WBAT. LLE underwent anterior total hip arthroplasty on 5/26. PMH: HTN, OP, hx of vasovagal syncope, R-TKA 2019, L TKA, L rTSA     Clinical Impressions Patient is a 83 year old female who was admitted for above. Patient was living at home independently prior level. Patient was limited with pain in LUE and LLE impacting participation in ADLs on this date.  Patient is pleasant and very motivated to be able to return home. Patient was noted to have decreased functional activity tolerance, decreased endurance, decreased standing balance, decreased safety awareness, and decreased knowledge of AD/AE impacting participation in ADLs. Patient will benefit from continued inpatient follow up therapy, <3 hours/day.      If plan is discharge home, recommend the following:   A lot of help with bathing/dressing/bathroom;Assistance with cooking/housework;Direct supervision/assist for medications management;Assist for transportation;Help with stairs or ramp for entrance;Direct supervision/assist for financial management;A lot of help with walking and/or transfers     Functional Status Assessment   Patient has had a recent decline in their functional status and demonstrates the ability to make significant improvements in function in a reasonable and predictable amount of time.     Equipment Recommendations   Other (comment) (total hip kit)      Precautions/Restrictions   Precautions Precautions: Fall Precaution/Restrictions Comments: sling for comfort for  LUE Restrictions Weight Bearing Restrictions Per Provider Order: Yes LUE Weight Bearing Per Provider Order: Weight bearing as tolerated LLE Weight Bearing Per Provider Order: Weight bearing as tolerated     Mobility Bed Mobility               General bed mobility comments: patient was OOB prior to start of session and returned to the same.          Balance Overall balance assessment: Mild deficits observed, not formally tested     ADL either performed or assessed with clinical judgement   ADL Overall ADL's : Needs assistance/impaired Eating/Feeding: Modified independent;Sitting   Grooming: Sitting;Minimal assistance   Upper Body Bathing: Sitting;Minimal assistance   Lower Body Bathing: Sitting/lateral leans;Total assistance   Upper Body Dressing : Sitting;Minimal assistance   Lower Body Dressing: Sitting/lateral leans;Maximal assistance   Toilet Transfer: Moderate assistance;Ambulation;Rolling walker (2 wheels) Toilet Transfer Details (indicate cue type and reason): to [facility] in bathroom wiht increased time. pain in L LUE AND LLE impacting movement. patient unable to transition back to recliner at bedsidde with chair rolled up to doorway for patient to sit down. Toileting- Clothing Manipulation and Hygiene: Sitting/lateral lean;Contact guard assist               Vision   Vision Assessment?: No apparent visual deficits            Pertinent Vitals/Pain Pain Assessment Pain Assessment: Faces Faces Pain Scale: Hurts even more Pain Location: L hip with movement Pain Descriptors / Indicators: Guarding, Grimacing, Constant Pain Intervention(s): Limited activity within patient's tolerance, Monitored during session, Repositioned, Ice applied, Premedicated before session     Extremity/Trunk Assessment Upper Extremity Assessment Upper Extremity Assessment: LUE deficits/detail LUE Deficits / Details: olecranon fracture and non displaced  radial head fracture with  recommendations for sling for comfort but WBAT LUE. noted to have brusing and edema around elbow impacting ROM LUE: Unable to fully assess due to pain   Lower Extremity Assessment Lower Extremity Assessment: Defer to PT evaluation   Cervical / Trunk Assessment Cervical / Trunk Assessment: Normal   Communication Communication Communication: No apparent difficulties   Cognition Arousal: Alert Behavior During Therapy: WFL for tasks assessed/performed Cognition: No apparent impairments                  Home Living Family/patient expects to be discharged to:: Private residence Living Arrangements: Alone Available Help at Discharge: Family;Available PRN/intermittently Type of Home: House Home Access: Stairs to enter [employer] of Steps: 2 Entrance Stairs-Rails: Left Home Layout: One level     Bathroom Shower/Tub: Producer, television/film/video: Handicapped height     Home Equipment: Agricultural consultant (2 wheels);Shower seat;Grab bars - tub/shower   Additional Comments: lift chair in den,      Prior Functioning/Environment Prior Level of Function : Independent/Modified Independent;Driving             Mobility Comments: No AD used most of the time, however she reported use of cane when ambulating at night in the home. ADLs Comments: She was independent with ADLs and driving.    OT Problem List: Pain;Impaired balance (sitting and/or standing);Decreased safety awareness;Decreased knowledge of precautions;Decreased knowledge of use of DME or AE;Decreased activity tolerance   OT Treatment/Interventions: Self-care/ADL training;DME and/or AE instruction;Therapeutic activities;Balance training;Therapeutic exercise;Energy conservation;Patient/family education      OT Goals(Current goals can be found in the care plan section)   Acute Rehab OT Goals Patient Stated Goal: to go home OT Goal Formulation: With patient Time For Goal Achievement:  03/20/24 Potential to Achieve Goals: Fair   OT Frequency:  Min 2X/week       AM-PAC OT "6 Clicks" Daily Activity     Outcome Measure Help from another person eating meals?: None Help from another person taking care of personal grooming?: A Little Help from another person toileting, which includes using toliet, bedpan, or urinal?: A Lot Help from another person bathing (including washing, rinsing, drying)?: A Lot Help from another person to put on and taking off regular upper body clothing?: A Little Help from another person to put on and taking off regular lower body clothing?: A Lot 6 Click Score: 16   End of Session Equipment Utilized During Treatment: Gait belt;Rolling walker (2 wheels);Other (comment) (bsc)  Activity Tolerance: Patient tolerated treatment well Patient left: in chair;with call bell/phone within reach;with chair alarm set  OT Visit Diagnosis: Unsteadiness on feet (R26.81);Other abnormalities of gait and mobility (R26.89);Pain Pain - Right/Left: Left Pain - part of body: Arm;Leg                Time: 1610-9604 OT Time Calculation (min): 49 min Charges:  OT General Charges $OT Visit: 1 Visit OT Evaluation $OT Eval Low Complexity: 1 Low OT Treatments $Self Care/Home Management : 23-37 mins  Pebbles Zeiders OTR/L, MS Acute Rehabilitation Department Office# [phone]   Jame Maze 03/06/2024, 9:18 AM

## 2024-03-06 NOTE — Evaluation (Addendum)
 Physical Therapy Evaluation Patient Details Name: Megan Reilly MRN: [id number] DOB: Jun 24, 1941 Today's Date: 03/06/2024  History of Present Illness  Pt is an 83 year old female who presented after a fall at home. Patient was found to have L displaced femoral neck fracture and  Left closed olecranon fracture, comminuted and displaced/left nondisplaced radial head fracture. LUE is being managed conservatively with WBAT. LLE underwent anterior total hip arthroplasty on 5/26. PMH: HTN, OP, hx of vasovagal syncope, R-TKA 2019, L TKA, L rTSA  Clinical Impression  Pt admitted with above diagnosis.  Pt agreeable and motivated to work with PT. Able to amb ~ 25' with RW and min assist, limitations d/t pain and fatigue. Experiencing urinary urgency and requiring assist with all aspects of toileting (BSC to pt, assist to transfer and manage briefs, etc) Patient will benefit from continued inpatient follow up therapy, <3 hours/day at d/c Pt states she would like to d/c home and that dtr can come assist/stay with her. IIF dtr is available, pt could possibly d/c with HHPT/OT; pt would need near 24/7 assist initially.  Continue to follow and will update plan accordingly  Pt currently with functional limitations due to the deficits listed below (see PT Problem List). Pt will benefit from acute skilled PT to increase their independence and safety with mobility to allow discharge.           If plan is discharge home, recommend the following: A little help with walking and/or transfers;A little help with bathing/dressing/bathroom;Assistance with cooking/housework;Help with stairs or ramp for entrance;Assist for transportation   Can travel by private vehicle   Yes    Equipment Recommendations None recommended by PT  Recommendations for Other Services       Functional Status Assessment Patient has had a recent decline in their functional status and demonstrates the ability to make significant  improvements in function in a reasonable and predictable amount of time.     Precautions / Restrictions Precautions Precautions: Fall Precaution/Restrictions Comments: sling for comfort for LUE Restrictions Weight Bearing Restrictions Per Provider Order: No LUE Weight Bearing Per Provider Order: Weight bearing as tolerated LLE Weight Bearing Per Provider Order: Weight bearing as tolerated      Mobility  Bed Mobility               General bed mobility comments:  (OOB to chair with OT)    Transfers Overall transfer level: Needs assistance Equipment used: Rolling walker (2 wheels) Transfers: Sit to/from Stand, Bed to chair/wheelchair/BSC Sit to Stand: Min assist   Step pivot transfers: Min assist, +2 safety/equipment       General transfer comment: verbal cues for hand placement and RLE position. assist to rise, transition to RW and control descent    Ambulation/Gait Ambulation/Gait assistance: Min assist Gait Distance (Feet): 25 Feet Assistive device: Rolling walker (2 wheels) Gait Pattern/deviations: Step-to pattern, Decreased stance time - left       General Gait Details: verbal cues for sequence, assist to steady, manaage RW. distance ltd by pain and fatigue  Stairs            Wheelchair Mobility     Tilt Bed    Modified Rankin (Stroke Patients Only)       Balance Overall balance assessment: Needs assistance Sitting-balance support: No upper extremity supported, Feet supported Sitting balance-Leahy Scale: Fair     Standing balance support: During functional activity, Reliant on assistive device for balance Standing balance-Leahy Scale: Poor Standing balance comment: reliant  on device and intermittent external assist; provided with briefs  and pad d/t urinary urgency. pt  unable to self assist LB garment after toileting--reliant on bil UE assist                             Pertinent Vitals/Pain Pain Assessment Pain Assessment:  Faces Faces Pain Scale: Hurts even more Pain Location: L hip with movement Pain Descriptors / Indicators: Guarding, Grimacing, Constant Pain Intervention(s): Limited activity within patient's tolerance, Monitored during session, Premedicated before session, Repositioned, Ice applied    Home Living Family/patient expects to be discharged to:: Private residence Living Arrangements: Alone Available Help at Discharge: Family;Available PRN/intermittently     Entrance Stairs-Rails: Left Entrance Stairs-Number of Steps: 2   Home Layout: One level Home Equipment: Agricultural consultant (2 wheels);Shower seat;Grab bars - tub/shower Additional Comments: lift chair in den,    Prior Function Prior Level of Function : Independent/Modified Independent;Driving             Mobility Comments: No AD used most of the time, however she reported use of cane when ambulating at night in the home. ADLs Comments: She was independent with ADLs and driving.     Extremity/Trunk Assessment   Upper Extremity Assessment Upper Extremity Assessment: Defer to OT evaluation LUE Deficits / Details: olecranon fracture and non displaced radial head fracture with recommendations for sling for comfort but WBAT LUE. noted to have brusing and edema around elbow impacting ROM LUE: Unable to fully assess due to pain    Lower Extremity Assessment Lower Extremity Assessment: LLE deficits/detail LLE Deficits / Details: ankle WFL,  knee and hip grossly 3+/5-- anticipated post op limitiations    Cervical / Trunk Assessment Cervical / Trunk Assessment: Normal  Communication   Communication Communication: No apparent difficulties    Cognition Arousal: Alert Behavior During Therapy: WFL for tasks assessed/performed   PT - Cognitive impairments: No apparent impairments                       PT - Cognition Comments: occasional redirection to task needed         Cueing Cueing Techniques: Verbal cues, Tactile  cues     General Comments      Exercises     Assessment/Plan    PT Assessment Patient needs continued PT services  PT Problem List Decreased strength;Decreased range of motion;Decreased balance;Decreased mobility;Decreased knowledge of use of DME;Pain;Decreased activity tolerance       PT Treatment Interventions DME instruction;Gait training;Stair training;Functional mobility training;Therapeutic activities;Patient/family education;Therapeutic exercise;Balance training    PT Goals (Current goals can be found in the Care Plan section)  Acute Rehab PT Goals PT Goal Formulation: With patient Time For Goal Achievement: 03/19/24 Potential to Achieve Goals: Good    Frequency Min 5X/week     Co-evaluation               AM-PAC PT "6 Clicks" Mobility  Outcome Measure Help needed turning from your back to your side while in a flat bed without using bedrails?: A Little Help needed moving from lying on your back to sitting on the side of a flat bed without using bedrails?: A Little Help needed moving to and from a bed to a chair (including a wheelchair)?: A Little Help needed standing up from a chair using your arms (e.g., wheelchair or bedside chair)?: A Little Help needed to walk in hospital room?: A Little  Help needed climbing 3-5 steps with a railing? : A Lot 6 Click Score: 17    End of Session Equipment Utilized During Treatment: Gait belt Activity Tolerance: Patient tolerated treatment well Patient left: in chair;with call bell/phone within reach;with chair alarm set Nurse Communication: Mobility status PT Visit Diagnosis: Other abnormalities of gait and mobility (R26.89)    Time: 1610-9604 PT Time Calculation (min) (ACUTE ONLY): 21 min   Charges:   PT Evaluation $PT Eval Low Complexity: 1 Low   PT General Charges $$ ACUTE PT VISIT: 1 Visit         Katie Moch, PT  Acute Rehab Dept ([facility]) [phone]  03/06/2024   [facility] 03/06/2024, 12:20 PM

## 2024-03-06 NOTE — Progress Notes (Addendum)
 PROGRESS NOTE    Megan Reilly  ZOX:[id number] DOB: 1941-08-21 DOA: 03/03/2024 PCP: Marquetta Sit, MD   Chief Complaint  Patient presents with   Fall    Brief Narrative:  Patient 83 year old female history of subarachnoid hemorrhage secondary to a fall back in 2015, knee replacement, was ambulating on day of admission when she fell while walking, denied any syncopal or prodromal symptoms and developed left hip and elbow pain.  Patient stated her legs just gave out from her.  Workup in the ED concerning for a commuted displaced olecranon fracture, radial head fracture, displaced left femoral fracture.  Patient admitted.  Patient seen in consultation by orthopedics, and patient status post left total hip arthroplasty on 03/05/2024.     Assessment & Plan:   Principal Problem:   Closed left hip fracture [facility]) Active Problems:   Essential hypertension   Olecranon fracture, left, closed, initial encounter   Radial neck fracture   Fall  #1 left femoral neck fracture, displaced - Secondary to mechanical fall. - Patient seen in consultation by orthopedics and patient underwent left total hip arthroplasty on 03/05/2024 per Dr. France Ina.  - Pain management, DVT prophylaxis per orthopedics. -Patient seen by PT/OT who are recommending SNF placement.  Patient in agreement. -TOC consulted.  2.  Left closed olecranon fracture, commuted and displaced/left nondisplaced radial head fracture -Secondary to mechanical fall. - Patient seen in consultation by orthopedics who have discussed with patient and recommending conservative management at this time. - Sling usage for comfort per orthopedics and can WBAT to left upper extremity. -PT/OT. - Per orthopedics.  3.  Fall -Patient denies any prodromal symptoms, no syncopal episodes, noted that her legs gave out. - Patient also stating blood pressure was low close in her fall as she states systolic blood pressures worse in the 90s. -  Antihypertensive medications initially held on admission.   - Norvasc  resumed at home dose and Cozaar  held.   - Decrease Norvasc  to 2.5 mg daily.   -PT/OT.    4 hypertension -Patient noted that systolic blood pressures were in the 90s after her fall. - Antihypertensive medications held on admission.   - BP elevated in the morning of 03/05/2024.   - BP soft this morning.   - Cozaar  discontinued and likely will not resume on discharge.   - Decrease Norvasc  to 2.5 mg daily.   - Patient noted to have already received 5 mg of Norvasc  this morning.   - Follow.  5.  Postop acute blood loss anemia -Hemoglobin currently at 10.0 from 13.8 on admission. - Follow H&H. - Transfusion threshold hemoglobin < 7.      DVT prophylaxis: Postop DVT prophylaxis per orthopedics. Code Status: Full Family Communication: Updated patient, no family at bedside.  Disposition: SNF once cleared by orthopedics and bed available.   Status is: Inpatient Remains inpatient appropriate because: Severity of illness   Consultants:  Orthopedics: Dr. Hiram Lukes 03/04/2024  Procedures: CT head/CT C-spine 03/03/2024 Plain films of the left elbow 03/03/2024 Plain films of the left femur 03/03/2024 Plain films of the pelvis 03/03/2024 Left total hip arthroplasty, anterior approach per orthopedics: Dr.Aluisio 03/05/2024  Antimicrobials:  Anti-infectives (From admission, onward)    None         Subjective: Sitting up in recliner, eating lunch.  Denies any chest pain or shortness of breath.  Complaining of bruising around left elbow with some difficulty using walker and pain when ambulating with walker.  Still with left lower extremity pain  but improved postoperatively.  Agrees to SNF placement.   Objective: Vitals:   03/05/24 2221 03/06/24 0224 03/06/24 0706 03/06/24 0911  BP: (!) 124/59 126/68 104/65 (!) 115/58  Pulse: 82 99 (!) 106 80  Resp: 18 17 18 16   Temp: 99.2 F (37.3 C) 99.6 F (37.6 C)  98.3 F (36.8  C)  TempSrc: Oral Oral  Oral  SpO2: 99% 96% 96% 98%  Weight:      Height:        Intake/Output Summary (Last 24 hours) at 03/06/2024 1226 Last data filed at 03/06/2024 0600 Gross per 24 hour  Intake 2136.16 ml  Output 500 ml  Net 1636.16 ml   Filed Weights   03/03/24 1945  Weight: 71.6 kg    Examination:  General exam: NAD. Respiratory system: CTAB anterior lung fields.  No wheezes, no crackles, no rhonchi.  Fair air movement.  Speaking in full sentences.  Cardiovascular system: RRR no murmurs rubs or gallops.  No JVD.  No lower extremity edema. Gastrointestinal system: Abdomen soft, nontender, nondistended, positive bowel sounds.  No rebound.  No guarding.  Central nervous system: Alert and oriented. No focal neurological deficits. Extremities: Left hip with ice.  Some TTP.  Postop dressing in place.   Left upper extremity with ecchymosis around elbow region and swelling. Skin: No rashes, lesions or ulcers Psychiatry: Judgement and insight appear normal. Mood & affect appropriate.     Data Reviewed: I have personally reviewed following labs and imaging studies  CBC: Recent Labs  Lab 03/03/24 1318 03/04/24 0328 03/05/24 0344 03/06/24 0335  WBC 12.4* 10.3 9.5 11.8*  NEUTROABS 9.7*  --   --   --   HGB 13.8 11.2* 10.9* 10.0*  HCT 41.7 34.9* 33.8* 30.9*  MCV 95.6 97.2 98.5 99.4  PLT 168 152 128* 118*    Basic Metabolic Panel: Recent Labs  Lab 03/03/24 1318 03/04/24 0328 03/05/24 0344 03/06/24 0335  NA 138 136 134* 133*  K 4.2 4.5 3.9 4.4  CL 104 104 105 105  CO2 20* 24 22 21*  GLUCOSE 114* 165* 224* 144*  BUN 20 28* 21 19  CREATININE 1.02* 0.95 0.99 0.85  CALCIUM 10.8* 9.7 8.7* 8.8*    GFR: Estimated Creatinine Clearance: 50.6 mL/min (by C-G formula based on SCr of 0.85 mg/dL).  Liver Function Tests: Recent Labs  Lab 03/03/24 1318  AST 31  ALT 20  ALKPHOS 59  BILITOT 0.7  PROT 6.4*  ALBUMIN 4.2    CBG: No results for input(s): "GLUCAP" in  the last 168 hours.   Recent Results (from the past 240 hours)  Surgical pcr screen     Status: None   Collection Time: 03/04/24  5:00 AM   Specimen: Nasal Mucosa; Nasal Swab  Result Value Ref Range Status   MRSA, PCR NEGATIVE NEGATIVE Final   Staphylococcus aureus NEGATIVE NEGATIVE Final    Comment: (NOTE) The Xpert SA Assay (FDA approved for NASAL specimens in patients 26 years of age and older), is one component of a comprehensive surveillance program. It is not intended to diagnose infection nor to guide or monitor treatment. Performed at [facility], [address], [place], [place] [address]          Radiology Studies: DG Pelvis Portable Result Date: 03/05/2024 CLINICAL DATA:  Status post hip arthroplasty. EXAM: PORTABLE PELVIS 1-2 VIEWS COMPARISON:  Preoperative imaging FINDINGS: Left hip arthroplasty in expected alignment. No periprosthetic lucency or fracture. Recent postsurgical change includes air  and edema in the soft tissues. IMPRESSION: Left hip arthroplasty without immediate postoperative complication. Electronically Signed   By: Chadwick Colonel M.D.   On: 03/05/2024 13:13   DG HIP UNILAT WITH PELVIS 1V LEFT Result Date: 03/05/2024 CLINICAL DATA:  [id number] Surgery, elective [id number] EXAM: DG HIP (WITH OR WITHOUT PELVIS) 1V*L* COMPARISON:  Mar 03, 2024 FINDINGS: Spot fluoroscopy images were obtained for surgical planning purposes. This demonstrates placement of a LEFT hip arthroplasty. Time: 4 second Dose: 0.5 mGy Please reference procedure report for further details. IMPRESSION: Fluoroscopic images for surgical planning purposes. Electronically Signed   By: Clancy Crimes M.D.   On: 03/05/2024 11:45   DG C-Arm 1-60 Min-No Report Result Date: 03/05/2024 Fluoroscopy was utilized by the requesting physician.  No radiographic interpretation.        Scheduled Meds:  amLODipine   5 mg Oral Daily   aspirin   81 mg Oral Daily   calcium -vitamin D    0.5 tablet Oral Q breakfast   cholecalciferol   2,000 Units Oral Daily   docusate sodium   100 mg Oral BID   latanoprost   1 drop Both Eyes QHS   magnesium  oxide  400 mg Oral Daily   Continuous Infusions:  sodium chloride  75 mL/hr at 03/05/24 1435      LOS: 3 days    Time spent: 35 minutes    Hilda Lovings, MD [facility]   To contact the attending provider between 7A-7P or the covering provider during after hours 7P-7A, please log into the web site [email or web address] and access using universal [place] password for that web site. If you do not have the password, please call the hospital operator.  03/06/2024, 12:26 PM

## 2024-03-07 DIAGNOSIS — S42402A Unspecified fracture of lower end of left humerus, initial encounter for closed fracture: Secondary | ICD-10-CM | POA: Diagnosis not present

## 2024-03-07 DIAGNOSIS — S72002A Fracture of unspecified part of neck of left femur, initial encounter for closed fracture: Secondary | ICD-10-CM | POA: Diagnosis not present

## 2024-03-07 LAB — CBC
HCT: 27.8 % — ABNORMAL LOW (ref 36.0–46.0)
Hemoglobin: 8.9 g/dL — ABNORMAL LOW (ref 12.0–15.0)
MCH: 31.2 pg (ref 26.0–34.0)
MCHC: 32 g/dL (ref 30.0–36.0)
MCV: 97.5 fL (ref 80.0–100.0)
Platelets: 133 10*3/uL — ABNORMAL LOW (ref 150–400)
RBC: 2.85 MIL/uL — ABNORMAL LOW (ref 3.87–5.11)
RDW: 13 % (ref 11.5–15.5)
WBC: 9.8 10*3/uL (ref 4.0–10.5)
nRBC: 0 % (ref 0.0–0.2)

## 2024-03-07 LAB — BASIC METABOLIC PANEL WITH GFR
Anion gap: 5 (ref 5–15)
BUN: 22 mg/dL (ref 8–23)
CO2: 24 mmol/L (ref 22–32)
Calcium: 8.8 mg/dL — ABNORMAL LOW (ref 8.9–10.3)
Chloride: 106 mmol/L (ref 98–111)
Creatinine, Ser: 0.68 mg/dL (ref 0.44–1.00)
GFR, Estimated: >60 mL/min (ref >60)
Glucose, Bld: 130 mg/dL — ABNORMAL HIGH (ref 70–99)
Potassium: 4.6 mmol/L (ref 3.5–5.1)
Sodium: 135 mmol/L (ref 135–145)

## 2024-03-07 NOTE — TOC Progression Note (Addendum)
 Transition of Care [facility]) - Progression Note    Patient Details  Name: Megan Reilly MRN: [id number] Date of Birth: 1941/10/06  Transition of Care [facility]) CM/SW Contact  Bari Leys, RN Phone Number: 03/07/2024, 11:15 AM  Clinical Narrative:  Met with patient at bedside to review short term rehab/SNF bed offers ( [facility], [facility], [facility], [facility]), pt accepted bed offer at [facility] [place], [place], admit coord notified.    -12:47pm SNF auth initiated via [employer]. SNF auth approved " Certification Number [id number] Status CERTIFIED IN TOTAL, team notified.     Expected Discharge Plan: Skilled Nursing Facility Barriers to Discharge: Continued Medical Work up  Expected Discharge Plan and Services       Living arrangements for the past 2 months: Single Family Home                                       Social Determinants of Health (SDOH) Interventions SDOH Screenings   Food Insecurity: No Food Insecurity (03/04/2024)  Housing: Low Risk  (03/04/2024)  Transportation Needs: No Transportation Needs (03/04/2024)  Utilities: Not At Risk (03/04/2024)  Alcohol Screen: Low Risk  (08/19/2023)  Depression (PHQ2-9): Low Risk  (07/18/2023)  Financial Resource Strain: Low Risk  (07/17/2023)  Physical Activity: Sufficiently Active (07/17/2023)  Social Connections: Moderately Integrated (03/04/2024)  Stress: Stress Concern Present (07/17/2023)  Tobacco Use: Low Risk  (03/04/2024)  Health Literacy: Adequate Health Literacy (07/18/2023)    Readmission Risk Interventions    03/06/2024   12:52 PM  Readmission Risk Prevention Plan  Post Dischage Appt Complete  Medication Screening Complete  Transportation Screening Complete

## 2024-03-07 NOTE — Progress Notes (Signed)
  Progress Note   Patient: Megan Reilly BJY:[id number] DOB: 1941/04/06 DOA: 03/03/2024     4 DOS: the patient was seen and examined on 03/07/2024   Brief hospital course: 83 year old female history of subarachnoid hemorrhage secondary to a fall back in 2015, knee replacement, was ambulating on day of admission when she fell while walking, denied any syncopal or prodromal symptoms and developed left hip and elbow pain.  Patient stated her legs just gave out from her.  Workup in the ED concerning for a commuted displaced olecranon fracture, radial head fracture, displaced left femoral fracture.  Patient admitted.  Patient seen in consultation by orthopedics, and patient status post left total hip arthroplasty on 03/05/2024.   Assessment and Plan: #1 left femoral neck fracture, displaced - Secondary to mechanical fall. - Patient seen in consultation by orthopedics and patient underwent left total hip arthroplasty on 03/05/2024 per Dr. France Ina.  - Pain management, DVT prophylaxis per orthopedics. -Patient seen by PT/OT who are recommending SNF placement.  Patient in agreement. -TOC consulted with SNF pending   2.  Left closed olecranon fracture, commuted and displaced/left nondisplaced radial head fracture -Secondary to mechanical fall. - Patient seen in consultation by orthopedics who have discussed with patient and recommending conservative management at this time. - Sling usage for comfort per orthopedics and can WBAT to left upper extremity. -PT/OT. - Per orthopedics.   3.  Fall -Patient denies any prodromal symptoms, no syncopal episodes, noted that her legs gave out. - Patient also stating blood pressure was low close in her fall as she states systolic blood pressures worse in the 90s. - Antihypertensive medications initially held on admission.   - cont Norvasc  2.5mg  daily resumed at home dose and Cozaar  held.   -PT/OT.     4 hypertension -Patient noted that systolic blood pressures  were in the 90s after her fall. - Antihypertensive medications held on admission.   - Cozaar  discontinued and likely will not resume on discharge.   - Cont Norvasc  to 2.5 mg daily.   - Follow.   5.  Postop acute blood loss anemia -Hemoglobin currently at 8.9 post-operatively from 13.8 on admission. - Follow H&H. - Hgb stable at this time      Subjective: Without complaints   Physical Exam: Vitals:   03/06/24 1412 03/06/24 2127 03/07/24 0609 03/07/24 1310  BP: 126/68 102/68 (!) 119/56 (!) 125/59  Pulse: 86 88 87 90  Resp: 17 17 16 16   Temp: 98.2 F (36.8 C) 98.1 F (36.7 C) 97.8 F (36.6 C) 97.8 F (36.6 C)  TempSrc: Oral Oral Oral Oral  SpO2: 98% 97% 96% 99%  Weight:      Height:       General exam: Awake, laying in bed, in nad Respiratory system: Normal respiratory effort, no wheezing Cardiovascular system: regular rate, s1, s2 Gastrointestinal system: Soft, nondistended, positive BS Central nervous system: CN2-12 grossly intact, strength intact Extremities: Perfused, no clubbing Skin: Normal skin turgor, no notable skin lesions seen Psychiatry: Mood normal // no visual hallucinations   Data Reviewed:  Labs reviewed: Na 135, K 4.6, Cr 0.68, WBC 9.8, Hgb 8.9, Plts 133  Family Communication: Pt in room, family not at bedside  Disposition: Status is: Inpatient Remains inpatient appropriate because: Severity of illness  Planned Discharge Destination: Skilled nursing facility    Author: Cherylle Corwin, MD 03/07/2024 4:30 PM  For on call review [email or web address].

## 2024-03-07 NOTE — Progress Notes (Signed)
   Subjective: 2 Days Post-Op Procedure(s) (LRB): ARTHROPLASTY, HIP, TOTAL, ANTERIOR APPROACH (Left) Patient seen in rounds by Dr. France Ina. Patient reports pain as moderate.   Patient is well, and has had no acute complaints or problems. No issues overnight.  Objective: Vital signs in last 24 hours: Temp:  [97.8 F (36.6 C)-98.3 F (36.8 C)] 97.8 F (36.6 C) (05/28 0609) Pulse Rate:  [80-88] 87 (05/28 0609) Resp:  [16-17] 16 (05/28 0609) BP: (102-126)/(56-68) 119/56 (05/28 0609) SpO2:  [96 %-98 %] 96 % (05/28 0609)  Intake/Output from previous day:  Intake/Output Summary (Last 24 hours) at 03/07/2024 0738 Last data filed at 03/07/2024 0600 Gross per 24 hour  Intake 810 ml  Output --  Net 810 ml    Intake/Output this shift: No intake/output data recorded.  Labs: Recent Labs    03/05/24 0344 03/06/24 0335 03/07/24 0356  HGB 10.9* 10.0* 8.9*   Recent Labs    03/06/24 0335 03/07/24 0356  WBC 11.8* 9.8  RBC 3.11* 2.85*  HCT 30.9* 27.8*  PLT 118* 133*   Recent Labs    03/06/24 0335 03/07/24 0356  NA 133* 135  K 4.4 4.6  CL 105 106  CO2 21* 24  BUN 19 22  CREATININE 0.85 0.68  GLUCOSE 144* 130*  CALCIUM 8.8* 8.8*   No results for input(s): "LABPT", "INR" in the last 72 hours.  Exam: General - Patient is Alert and Oriented Extremity - Neurologically intact Neurovascular intact Sensation intact distally Dorsiflexion/Plantar flexion intact Dressing/Incision - clean, dry, no drainage Motor Function - intact, moving foot and toes well on exam.  Past Medical History:  Diagnosis Date   Allergy    Arthritis    Blood transfusion abn reaction or complication, no procedure mishap    83 years old after tonsilectomy   Hypertension    Osteoporosis 04/2019   T score -2.1 improved from prior DEXA   Tibia fracture    left   Vasovagal syncope    2 prior incidences last was in 2015 ; treated in ED , reports no recurrence since then     Assessment/Plan: 2  Days Post-Op Procedure(s) (LRB): ARTHROPLASTY, HIP, TOTAL, ANTERIOR APPROACH (Left) Principal Problem:   Closed left hip fracture (HCC) Active Problems:   Essential hypertension   Olecranon fracture, left, closed, initial encounter   Radial neck fracture   Fall  Estimated body mass index is 24 kg/m as calculated from the following:   Height as of this encounter: 5\' 8"  (1.727 m).   Weight as of this encounter: 71.6 kg.  DVT Prophylaxis - Aspirin  Weight-bearing as tolerated.  Continue physical therapy while admitted. Plan to discharge to SNF when bed ready.  R. Brinton Canavan, PA-C Orthopedic Surgery 03/07/2024, 7:38 AM

## 2024-03-07 NOTE — Progress Notes (Signed)
 Physical Therapy Treatment Patient Details Name: Megan Reilly MRN: [id number] DOB: [birth year] Today's Date: 03/07/2024   History of Present Illness Pt is an 83 year old female who presented after a fall at home. Patient was found to have L displaced femoral neck fracture and  Left closed olecranon fracture, commuted and displaced/left nondisplaced radial head fracture. LUE is being managed conservatively with WBAT. LLE underwent anterior total hip arthroplasty on 5/26. PMH: HTN, OP, hx of vasovagal syncope, R-TKA 2019, L TKA, L rTSA    PT Comments  Pt progressing well, able to tolerate 59ft of gait training with RW and CGA. 5/10 R hip discomfort during mobility. Continues to require physical assist for safe transfers. Reviewed and completed strengthening exercises in sitting and discussed benefits of STR at d/c. Will continue to progress acutely.    If plan is discharge home, recommend the following: A little help with walking and/or transfers;A little help with bathing/dressing/bathroom;Assistance with cooking/housework;Help with stairs or ramp for entrance;Assist for transportation   Can travel by private vehicle     Yes  Equipment Recommendations  None recommended by PT    Recommendations for Other Services       Precautions / Restrictions Precautions Precautions: Fall Restrictions Weight Bearing Restrictions Per Provider Order: Yes LUE Weight Bearing Per Provider Order: Weight bearing as tolerated LLE Weight Bearing Per Provider Order: Weight bearing as tolerated     Mobility  Bed Mobility               General bed mobility comments: Pt in recliner pre/post session    Transfers Overall transfer level: Needs assistance Equipment used: Rolling walker (2 wheels) Transfers: Sit to/from Stand, Bed to chair/wheelchair/BSC Sit to Stand: Min assist   Step pivot transfers: Min assist       General transfer comment: verbal cues for hand placement and RLE position.  assist to rise, transition to RW and control descent. ModA to initially raise from recliner. Assist for hygiene after BSC use    Ambulation/Gait Ambulation/Gait assistance: Contact guard assist Gait Distance (Feet): 75 Feet Assistive device: Rolling walker (2 wheels) Gait Pattern/deviations: Step-to pattern, Decreased stance time - left, Antalgic       General Gait Details: verbal cues for sequence, assist to steady, manaage RW. distance ltd by pain and fatigue   Stairs             Wheelchair Mobility     Tilt Bed    Modified Rankin (Stroke Patients Only)       Balance Overall balance assessment: Needs assistance Sitting-balance support: No upper extremity supported, Feet supported Sitting balance-Leahy Scale: Fair     Standing balance support: During functional activity, Reliant on assistive device for balance Standing balance-Leahy Scale: Fair Standing balance comment: reliant on device and intermittent external assist                            Communication Communication Communication: No apparent difficulties  Cognition Arousal: Alert Behavior During Therapy: WFL for tasks assessed/performed   PT - Cognitive impairments: No apparent impairments                       PT - Cognition Comments: occasional redirection to task needed Following commands: Intact      Cueing Cueing Techniques: Verbal cues, Tactile cues  Exercises General Exercises - Lower Extremity Ankle Circles/Pumps: AROM, Both, 20 reps Quad Sets: AROM, Both, 10 reps  Long Arc Quad: AROM, Both, 10 reps Other Exercises Other Exercises:  (Pt given HEP and reviewed with good understanding. Educated on benefits of STR and increasing mobility)    General Comments General comments (skin integrity, edema, etc.):  (L UE edematous, educated on elevating and icing)      Pertinent Vitals/Pain Pain Assessment Pain Assessment: 0-10 Pain Score: 5  Pain Location: L hip with  movement Pain Descriptors / Indicators: Guarding, Grimacing, Constant Pain Intervention(s): Monitored during session    Home Living                          Prior Function            PT Goals (current goals can now be found in the care plan section) Acute Rehab PT Goals Patient Stated Goal:  (Go to rehab) Progress towards PT goals: Progressing toward goals    Frequency    Min 5X/week      PT Plan      Co-evaluation              AM-PAC PT "6 Clicks" Mobility   Outcome Measure  Help needed turning from your back to your side while in a flat bed without using bedrails?: A Little Help needed moving from lying on your back to sitting on the side of a flat bed without using bedrails?: A Little Help needed moving to and from a bed to a chair (including a wheelchair)?: A Little Help needed standing up from a chair using your arms (e.g., wheelchair or bedside chair)?: A Little Help needed to walk in hospital room?: A Little Help needed climbing 3-5 steps with a railing? : A Lot 6 Click Score: 17    End of Session Equipment Utilized During Treatment: Gait belt Activity Tolerance: Patient tolerated treatment well Patient left: in chair;with call bell/phone within reach;with chair alarm set;with family/visitor present Nurse Communication: Mobility status PT Visit Diagnosis: Other abnormalities of gait and mobility (R26.89)     Time: 4098-1191 PT Time Calculation (min) (ACUTE ONLY): 42 min  Charges:    $Gait Training: 8-22 mins $Therapeutic Exercise: 8-22 mins $Therapeutic Activity: 8-22 mins PT General Charges $$ ACUTE PT VISIT: 1 Visit                    Melvyn Stagers, PTA  Megan Reilly 03/07/2024, 12:40 PM

## 2024-03-07 NOTE — Hospital Course (Signed)
 83 year old female history of subarachnoid hemorrhage secondary to a fall back in 2015, knee replacement, was ambulating on day of admission when she fell while walking, denied any syncopal or prodromal symptoms and developed left hip and elbow pain.  Patient stated her legs just gave out from her.  Workup in the ED concerning for a commuted displaced olecranon fracture, radial head fracture, displaced left femoral fracture.  Patient admitted.  Patient seen in consultation by orthopedics, and patient status post left total hip arthroplasty on 03/05/2024.

## 2024-03-07 NOTE — Plan of Care (Signed)
  Problem: Activity: Goal: Risk for activity intolerance will decrease Outcome: Progressing   Problem: Pain Managment: Goal: General experience of comfort will improve and/or be controlled Outcome: Progressing   Problem: Safety: Goal: Ability to remain free from injury will improve Outcome: Progressing   Problem: Skin Integrity: Goal: Will show signs of wound healing Outcome: Progressing

## 2024-03-08 DIAGNOSIS — S42402A Unspecified fracture of lower end of left humerus, initial encounter for closed fracture: Secondary | ICD-10-CM | POA: Diagnosis not present

## 2024-03-08 DIAGNOSIS — R55 Syncope and collapse: Secondary | ICD-10-CM | POA: Diagnosis not present

## 2024-03-08 DIAGNOSIS — R2681 Unsteadiness on feet: Secondary | ICD-10-CM | POA: Diagnosis not present

## 2024-03-08 DIAGNOSIS — H409 Unspecified glaucoma: Secondary | ICD-10-CM | POA: Diagnosis not present

## 2024-03-08 DIAGNOSIS — M6281 Muscle weakness (generalized): Secondary | ICD-10-CM | POA: Diagnosis not present

## 2024-03-08 DIAGNOSIS — I10 Essential (primary) hypertension: Secondary | ICD-10-CM | POA: Diagnosis not present

## 2024-03-08 DIAGNOSIS — M79672 Pain in left foot: Secondary | ICD-10-CM | POA: Diagnosis not present

## 2024-03-08 DIAGNOSIS — D649 Anemia, unspecified: Secondary | ICD-10-CM | POA: Diagnosis not present

## 2024-03-08 DIAGNOSIS — Z7401 Bed confinement status: Secondary | ICD-10-CM | POA: Diagnosis not present

## 2024-03-08 DIAGNOSIS — Z743 Need for continuous supervision: Secondary | ICD-10-CM | POA: Diagnosis not present

## 2024-03-08 DIAGNOSIS — M1712 Unilateral primary osteoarthritis, left knee: Secondary | ICD-10-CM | POA: Diagnosis not present

## 2024-03-08 DIAGNOSIS — R600 Localized edema: Secondary | ICD-10-CM | POA: Diagnosis not present

## 2024-03-08 DIAGNOSIS — R278 Other lack of coordination: Secondary | ICD-10-CM | POA: Diagnosis not present

## 2024-03-08 DIAGNOSIS — S59919A Unspecified injury of unspecified forearm, initial encounter: Secondary | ICD-10-CM | POA: Diagnosis not present

## 2024-03-08 DIAGNOSIS — S72142D Displaced intertrochanteric fracture of left femur, subsequent encounter for closed fracture with routine healing: Secondary | ICD-10-CM | POA: Diagnosis not present

## 2024-03-08 DIAGNOSIS — D429 Neoplasm of uncertain behavior of meninges, unspecified: Secondary | ICD-10-CM | POA: Diagnosis not present

## 2024-03-08 DIAGNOSIS — M810 Age-related osteoporosis without current pathological fracture: Secondary | ICD-10-CM | POA: Diagnosis not present

## 2024-03-08 DIAGNOSIS — S72002A Fracture of unspecified part of neck of left femur, initial encounter for closed fracture: Secondary | ICD-10-CM | POA: Diagnosis not present

## 2024-03-08 DIAGNOSIS — S72009A Fracture of unspecified part of neck of unspecified femur, initial encounter for closed fracture: Secondary | ICD-10-CM | POA: Diagnosis not present

## 2024-03-08 LAB — COMPREHENSIVE METABOLIC PANEL WITH GFR
ALT: 78 U/L — ABNORMAL HIGH (ref 0–44)
AST: 90 U/L — ABNORMAL HIGH (ref 15–41)
Albumin: 2.6 g/dL — ABNORMAL LOW (ref 3.5–5.0)
Alkaline Phosphatase: 115 U/L (ref 38–126)
Anion gap: 6 (ref 5–15)
BUN: 22 mg/dL (ref 8–23)
CO2: 23 mmol/L (ref 22–32)
Calcium: 8.4 mg/dL — ABNORMAL LOW (ref 8.9–10.3)
Chloride: 102 mmol/L (ref 98–111)
Creatinine, Ser: 0.75 mg/dL (ref 0.44–1.00)
GFR, Estimated: >60 mL/min (ref >60)
Glucose, Bld: 148 mg/dL — ABNORMAL HIGH (ref 70–99)
Potassium: 4.1 mmol/L (ref 3.5–5.1)
Sodium: 131 mmol/L — ABNORMAL LOW (ref 135–145)
Total Bilirubin: 0.9 mg/dL (ref 0.0–1.2)
Total Protein: 5.2 g/dL — ABNORMAL LOW (ref 6.5–8.1)

## 2024-03-08 LAB — CBC
HCT: 28.5 % — ABNORMAL LOW (ref 36.0–46.0)
Hemoglobin: 8.9 g/dL — ABNORMAL LOW (ref 12.0–15.0)
MCH: 31.7 pg (ref 26.0–34.0)
MCHC: 31.2 g/dL (ref 30.0–36.0)
MCV: 101.4 fL — ABNORMAL HIGH (ref 80.0–100.0)
Platelets: 127 10*3/uL — ABNORMAL LOW (ref 150–400)
RBC: 2.81 MIL/uL — ABNORMAL LOW (ref 3.87–5.11)
RDW: 12.8 % (ref 11.5–15.5)
WBC: 8.5 10*3/uL (ref 4.0–10.5)
nRBC: 0 % (ref 0.0–0.2)

## 2024-03-08 MED ORDER — OXYCODONE HCL 5 MG PO TABS: 5.0000 mg | ORAL_TABLET | Freq: Four times a day (QID) | ORAL | 0 refills | Status: DC | PRN

## 2024-03-08 MED ORDER — ASPIRIN 81 MG PO CHEW
81.0000 mg | CHEWABLE_TABLET | Freq: Every day | ORAL | 0 refills | Status: DC
Start: 2024-03-08 — End: 2024-03-21

## 2024-03-08 MED ORDER — TRAMADOL HCL 50 MG PO TABS: 50.0000 mg | ORAL_TABLET | Freq: Four times a day (QID) | ORAL | 0 refills | Status: DC | PRN

## 2024-03-08 MED ORDER — METHOCARBAMOL 500 MG PO TABS: 500.0000 mg | ORAL_TABLET | Freq: Four times a day (QID) | ORAL | 0 refills | Status: DC | PRN

## 2024-03-08 MED ORDER — AMLODIPINE BESYLATE 2.5 MG PO TABS: 2.5000 mg | ORAL_TABLET | Freq: Every day | ORAL | Status: DC

## 2024-03-08 NOTE — Progress Notes (Signed)
 Physical Therapy Treatment Patient Details Name: Gurpreet Mariani MRN: [id number] DOB: 19-Mar-1941 Today's Date: 03/08/2024   History of Present Illness Pt is an 83 year old female who presented after a fall at home. Patient was found to have L displaced femoral neck fracture and  Left closed olecranon fracture, commuted and displaced/left nondisplaced radial head fracture. LUE is being managed conservatively with WBAT. LLE underwent anterior total hip arthroplasty on 5/26. PMH: HTN, OP, hx of vasovagal syncope, R-TKA 2019, L TKA, L rTSA    PT Comments  Pt continues to progress well with skilled PT sessions. Gait distance has increased to 150' with RW, continues to favor L LE during stance time, however only c/o 2/10 hip and L UE pain. Anticipate good progression at STR prior to returning home.    If plan is discharge home, recommend the following: A little help with walking and/or transfers;A little help with bathing/dressing/bathroom;Assistance with cooking/housework;Help with stairs or ramp for entrance;Assist for transportation   Can travel by private vehicle     Yes  Equipment Recommendations  None recommended by PT    Recommendations for Other Services       Precautions / Restrictions Precautions Precautions: Fall Precaution/Restrictions Comments: sling for comfort for LUE Restrictions Weight Bearing Restrictions Per Provider Order: Yes LUE Weight Bearing Per Provider Order: Weight bearing as tolerated LLE Weight Bearing Per Provider Order: Weight bearing as tolerated     Mobility  Bed Mobility               General bed mobility comments: Pt in recliner pre/post session    Transfers Overall transfer level: Needs assistance Equipment used: Rolling walker (2 wheels) Transfers: Sit to/from Stand, Bed to chair/wheelchair/BSC Sit to Stand: Min assist           General transfer comment: verbal cues for hand placement and RLE position. assist to rise, transition  to RW and control descent. ModA to initially raise from recliner. Assist for hygiene after BSC use    Ambulation/Gait Ambulation/Gait assistance: Contact guard assist Gait Distance (Feet): 150 Feet Assistive device: Rolling walker (2 wheels) Gait Pattern/deviations: Step-to pattern, Decreased stance time - left, Antalgic Gait velocity: decreased     General Gait Details: verbal cues for sequence, assist to steady, manaage RW. distance ltd by pain and fatigue   Stairs             Wheelchair Mobility     Tilt Bed    Modified Rankin (Stroke Patients Only)       Balance Overall balance assessment: Needs assistance Sitting-balance support: No upper extremity supported, Feet supported Sitting balance-Leahy Scale: Fair     Standing balance support: Bilateral upper extremity supported, During functional activity, Reliant on assistive device for balance Standing balance-Leahy Scale: Fair Standing balance comment: reliant on device and intermittent external assist                            Communication Communication Communication: No apparent difficulties  Cognition Arousal: Alert Behavior During Therapy: WFL for tasks assessed/performed   PT - Cognitive impairments: No apparent impairments                       PT - Cognition Comments: occasional redirection to task needed Following commands: Intact      Cueing Cueing Techniques: Verbal cues, Tactile cues  Exercises General Exercises - Lower Extremity Ankle Circles/Pumps: AROM, Both, 20 reps Long  Arc Quad: AROM, Both, 10 reps    General Comments        Pertinent Vitals/Pain Pain Assessment Pain Assessment: 0-10 Pain Score: 2  Pain Location:  (L hip and L UE) Pain Descriptors / Indicators: Guarding, Discomfort Pain Intervention(s): Monitored during session    Home Living                          Prior Function            PT Goals (current goals can now be found in  the care plan section) Progress towards PT goals: Progressing toward goals    Frequency    Min 5X/week      PT Plan      Co-evaluation              AM-PAC PT "6 Clicks" Mobility   Outcome Measure  Help needed turning from your back to your side while in a flat bed without using bedrails?: A Little Help needed moving from lying on your back to sitting on the side of a flat bed without using bedrails?: A Little Help needed moving to and from a bed to a chair (including a wheelchair)?: A Little Help needed standing up from a chair using your arms (e.g., wheelchair or bedside chair)?: A Little Help needed to walk in hospital room?: A Little Help needed climbing 3-5 steps with a railing? : A Lot 6 Click Score: 17    End of Session Equipment Utilized During Treatment: Gait belt Activity Tolerance: Patient tolerated treatment well Patient left: in chair;with call bell/phone within reach;with chair alarm set;with family/visitor present Nurse Communication: Mobility status PT Visit Diagnosis: Other abnormalities of gait and mobility (R26.89)     Time: 6578-4696 PT Time Calculation (min) (ACUTE ONLY): 27 min  Charges:    $Gait Training: 8-22 mins $Therapeutic Exercise: 8-22 mins PT General Charges $$ ACUTE PT VISIT: 1 Visit                    Melvyn Stagers, PTA  Diona Franklin 03/08/2024, 2:12 PM

## 2024-03-08 NOTE — Progress Notes (Signed)
 Report given to nurse [facility], LPN.

## 2024-03-08 NOTE — Discharge Summary (Signed)
 Physician Discharge Summary   Patient: Megan Reilly MRN: [id number] DOB: [birth year]  Admit date:     03/03/2024  Discharge date: 03/08/24  Discharge Physician: Cherylle Corwin   PCP: Marquetta Sit, MD   Recommendations at discharge:    Follow up with PCP in 1-2 weeks Follow up with orthopedic surgery in 2 weeks  Discharge Diagnoses: Principal Problem:   Closed left hip fracture (HCC) Active Problems:   Essential hypertension   Olecranon fracture, left, closed, initial encounter   Radial neck fracture   Fall  Resolved Problems:   * No resolved hospital problems. 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  Patient stated her legs just gave out from her.  Workup in the ED concerning for a commuted displaced olecranon fracture, radial head fracture, displaced left femoral fracture.  Patient admitted.  Patient seen in consultation by orthopedics, and patient status post left total hip arthroplasty on 03/05/2024.   Assessment and Plan: #1 left femoral neck fracture, displaced - Secondary to mechanical fall. - Patient seen in consultation by orthopedics and patient underwent left total hip arthroplasty on 03/05/2024 per Dr. France Ina.  - Pain management, DVT prophylaxis per orthopedics. -Patient seen by PT/OT who are recommending SNF placement.  Patient in agreement.   2.  Left closed olecranon fracture, commuted and displaced/left nondisplaced radial head fracture -Secondary to mechanical fall. - Patient seen in consultation by orthopedics who have discussed with patient and recommending conservative management at this time. - Sling usage for comfort per orthopedics and can WBAT to left upper extremity. -PT/OT. - Per orthopedics.   3.  Fall -Patient  denies any prodromal symptoms, no syncopal episodes, noted that her legs gave out. - Patient also stating blood pressure was low close in her fall as she states systolic blood pressures worse in the 90s. - Antihypertensive medications initially held on admission.   - cont Norvasc  2.5mg  daily resumed at home dose and Cozaar  held.   -PT/OT.     4 hypertension -Patient noted that systolic blood pressures were in the 90s after her fall. - Antihypertensive medications held on admission.   - Cozaar  discontinued and likely will not resume on discharge.   - Cont Norvasc  to 2.5 mg daily.     5.  Postop acute blood loss anemia -Hemoglobin currently at 8.9 post-operatively from 13.8 on admission. - Hgb stable at this time    Consultants: Orthopedic Surgery Procedures performed: Left total hip arthroplasty, anterior approach   Disposition: Skilled nursing facility Diet recommendation:  Regular diet DISCHARGE MEDICATION: Allergies as of 03/08/2024       Reactions   Diphenhydramine  Other (See Comments)   Light-headedness   Hydrochlorothiazide  Other (See Comments)   Fatigue, body aches        Medication List     STOP taking these medications    Advil  200 MG tablet Generic drug: ibuprofen    losartan  50 MG tablet Commonly known as: COZAAR        TAKE these medications    acetaminophen  500 MG tablet Commonly known as: TYLENOL  Take 500 mg by mouth every 6 (six) hours as needed for mild pain (pain score 1-3) or headache.   amLODipine  2.5 MG tablet Commonly known as: NORVASC  Take 1 tablet (2.5 mg total) by mouth daily. Start  taking on: Mar 09, 2024 What changed:  medication strength how much to take   aspirin  81 MG chewable tablet Chew 1 tablet (81 mg total) by mouth daily for 19 days. Then take an 81 mg aspirin  once a day for 3 weeks.   CALCIUM + D3 PO Take 0.5 tablets by mouth daily.   cetirizine 10 MG tablet Commonly known as: ZYRTEC Take 10 mg by mouth daily as  needed for allergies.   fish oil-omega-3 fatty acids 1000 MG capsule Take 1 g by mouth daily.   fluticasone  50 MCG/ACT nasal spray Commonly known as: FLONASE  SPRAY 1 TO 2 SPARYS IN EACH NOSTRIL DAILY. What changed: See the new instructions.   latanoprost  0.005 % ophthalmic solution Commonly known as: XALATAN  Place 1 drop into both eyes at bedtime.   Magnesium 400 MG Caps Take 400 mg by mouth daily. Triple   melatonin 5 MG Tabs Take 5 mg by mouth at bedtime as needed (for sleep).   methocarbamol  500 MG tablet Commonly known as: ROBAXIN  Take 1 tablet (500 mg total) by mouth every 6 (six) hours as needed for muscle spasms.   multivitamin with minerals Tabs tablet Take 1 tablet by mouth every morning.   Myrbetriq  50 MG Tb24 tablet Generic drug: mirabegron  ER TAKE 1 TABLET BY MOUTH EVERY DAY What changed:  when to take this reasons to take this   oxyCODONE  5 MG immediate release tablet Commonly known as: Oxy IR/ROXICODONE  Take 1-2 tablets (5-10 mg total) by mouth every 6 (six) hours as needed for severe pain (pain score 7-10) (pain score 7-10).   Systane Complete 0.6 % Soln Generic drug: Propylene Glycol Place 1 drop into both eyes 3 (three) times daily as needed (for dryness).   traMADol  50 MG tablet Commonly known as: ULTRAM  Take 1-2 tablets (50-100 mg total) by mouth every 6 (six) hours as needed for moderate pain (pain score 4-6). What changed:  how much to take reasons to take this   triamcinolone  cream 0.1 % Commonly known as: KENALOG  Apply 1 Application topically 2 (two) times daily as needed (for itching).   VITAMIN D -3 PO Take 2,000 Units by mouth daily.        Contact information for follow-up providers     Aluisio, Samuel Crock, MD. Schedule an appointment as soon as possible for a visit in 2 week(s).   Specialty: Orthopedic Surgery Contact information: [address] [place] [address] [place] [address] [phone]              Contact  information for after-discharge care     Destination     [facility] .   Service: Skilled Nursing Contact information: [address] [place]  [address] [phone]                    Discharge Exam: Cleavon Curls Weights   03/03/24 1945  Weight: 71.6 kg   General exam: Awake, laying in bed, in nad Respiratory system: Normal respiratory effort, no wheezing Cardiovascular system: regular rate, s1, s2 Gastrointestinal system: Soft, nondistended, positive BS Central nervous system: CN2-12 grossly intact, strength intact Extremities: Perfused, no clubbing Skin: Normal skin turgor, no notable skin lesions seen Psychiatry: Mood normal // no visual hallucinations   Condition at discharge: fair  The results of significant diagnostics from this hospitalization (including imaging, microbiology, ancillary and laboratory) are listed below for reference.   Imaging Studies: DG Pelvis Portable Result Date: 03/05/2024 CLINICAL DATA:  Status post hip arthroplasty.  EXAM: PORTABLE PELVIS 1-2 VIEWS COMPARISON:  Preoperative imaging FINDINGS: Left hip arthroplasty in expected alignment. No periprosthetic lucency or fracture. Recent postsurgical change includes air and edema in the soft tissues. IMPRESSION: Left hip arthroplasty without immediate postoperative complication. Electronically Signed   By: Chadwick Colonel M.D.   On: 03/05/2024 13:13   DG HIP UNILAT WITH PELVIS 1V LEFT Result Date: 03/05/2024 CLINICAL DATA:  [id number] Surgery, elective [id number] EXAM: DG HIP (WITH OR WITHOUT PELVIS) 1V*L* COMPARISON:  Mar 03, 2024 FINDINGS: Spot fluoroscopy images were obtained for surgical planning purposes. This demonstrates placement of a LEFT hip arthroplasty. Time: 4 second Dose: 0.5 mGy Please reference procedure report for further details. IMPRESSION: Fluoroscopic images for surgical planning purposes. Electronically Signed   By: Clancy Crimes M.D.   On: 03/05/2024  11:45   DG C-Arm 1-60 Min-No Report Result Date: 03/05/2024 Fluoroscopy was utilized by the requesting physician.  No radiographic interpretation.   DG Femur Min 2 Views Left Result Date: 03/03/2024 CLINICAL DATA:  Left hip pain after fall. EXAM: LEFT FEMUR 2 VIEWS; PELVIS - 1-2 VIEW COMPARISON:  None Available. FINDINGS: Femur: Displaced femoral neck fracture. There is proximal migration of the femoral shaft with apex lateral angulation. The distal femur is intact. Previous knee arthroplasty. No focal bone lesion or erosive change. Pelvis: No additional fracture of the pelvis. Pubic rami are intact. Pubic symphysis and sacroiliac joints are congruent. Milder underlying bilateral hip osteoarthritis. IMPRESSION: Displaced left femoral neck fracture. Electronically Signed   By: Chadwick Colonel M.D.   On: 03/03/2024 14:27   DG Pelvis 1-2 Views Result Date: 03/03/2024 CLINICAL DATA:  Left hip pain after fall. EXAM: LEFT FEMUR 2 VIEWS; PELVIS - 1-2 VIEW COMPARISON:  None Available. FINDINGS: Femur: Displaced femoral neck fracture. There is proximal migration of the femoral shaft with apex lateral angulation. The distal femur is intact. Previous knee arthroplasty. No focal bone lesion or erosive change. Pelvis: No additional fracture of the pelvis. Pubic rami are intact. Pubic symphysis and sacroiliac joints are congruent. Milder underlying bilateral hip osteoarthritis. IMPRESSION: Displaced left femoral neck fracture. Electronically Signed   By: Chadwick Colonel M.D.   On: 03/03/2024 14:27   DG Elbow Complete Left Result Date: 03/03/2024 CLINICAL DATA:  Pain after fall.  Bruising and swelling. EXAM: LEFT ELBOW - COMPLETE 3+ VIEW COMPARISON:  None Available. FINDINGS: Comminuted and displaced olecranon fracture. There is osseous distraction of at least 14 mm at the articular surface. Potential nondisplaced fracture of the radial neck. Mild degenerative spurring. There is a moderate joint effusion. Posterior  soft tissue edema. IMPRESSION: 1. Comminuted and displaced olecranon fracture with osseous distraction of at least 14 mm at the articular surface. 2. Potential nondisplaced fracture of the radial neck. Electronically Signed   By: Chadwick Colonel M.D.   On: 03/03/2024 14:26   CT Cervical Spine Wo Contrast Result Date: 03/03/2024 EXAM: CT CERVICAL SPINE WITHOUT CONTRAST 03/03/2024 01:51:43 PM TECHNIQUE: CT of the cervical was performed without the administration of intravenous contrast. Multiplanar reformatted images are provided for review. Automated exposure control, iterative reconstruction, and/or weight based adjustment of the mA/kV was utilized to reduce the radiation dose to as low as reasonably achievable. COMPARISON: None available. CLINICAL HISTORY: Neck trauma (Age >= 65y).  Fall. FINDINGS: CERVICAL SPINE: BONES/ALIGNMENT: Chronic changes at the dens suggest rheumatoid arthritis. Grade 1 degenerative anterolisthesis is present at C7-T1. Skin degenerative anterolisthesis is present at C3-4. DEGENERATIVE CHANGES: Chronic loss of disc height and uncovertebral spine is present  at C4-5, C5-6, and C6-7. SOFT TISSUES: There is no prevertebral soft tissue swelling. IMPRESSION: 1. No acute abnormality of the cervical spine related to the reported neck trauma. 2. 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 Electronically signed by: Audree Leas MD 03/03/2024 02:19 PM EDT RP Workstation: [id number]   CT Head Wo Contrast Result Date: 03/03/2024 EXAM: CT HEAD WITHOUT 03/03/2024 01:51:43 PM TECHNIQUE: CT of the head was performed without the administration of intravenous contrast. Automated exposure control, iterative reconstruction, and/or weight based adjustment of the mA/kV was utilized to reduce the radiation dose to as low as reasonably achievable. COMPARISON: MR head without and with contrast 05/18/2023. CLINICAL HISTORY: Head trauma, minor (Age >= 65y). Non con. Fall. FINDINGS: BRAIN  AND VENTRICLES: There is no acute intracranial hemorrhage, mass effect or midline shift. No abnormal extra-axial fluid collection. The gray-white differentiation is maintained without evidence of an acute infarct. The ventricles are proportionate to the degree of atrophy. Paraventricular and subcortical white matter hypoattenuation is moderately advanced for age, stable. ORBITS: Bilateral lens replacements are present. The globes and orbits are otherwise within normal limits. SINUSES: The visualized paranasal sinuses and mastoid air cells demonstrate no acute abnormality. 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 A second meningioma measuring 6 mm. No acute abnormality of the visualized skull or soft tissues. VASCULATURE: Atherosclerotic calcifications are present in the cavernous carotid arteries bilaterally and at the dural margin of both vertebral arteries. No hyperdense vessel is present. IMPRESSION: 1. No acute intracranial abnormality related to the minor head trauma. 2. 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 3. Moderately advanced paraventricular and subcortical white matter hypoattenuation for age, stable. 4. Atherosclerosis. Electronically signed by: Audree Leas MD 03/03/2024 02:16 PM EDT RP Workstation: [id number]    Microbiology: Results for orders placed or performed during the hospital encounter of 03/03/24  Surgical pcr screen     Status: None   Collection Time: 03/04/24  5:00 AM   Specimen: Nasal Mucosa; Nasal Swab  Result Value Ref Range Status   MRSA, PCR NEGATIVE NEGATIVE Final   Staphylococcus aureus NEGATIVE NEGATIVE Final    Comment: (NOTE) The Xpert SA Assay (FDA approved for NASAL specimens in patients 33 years of age and older), is one component of a comprehensive surveillance program. It is not intended to diagnose  infection nor to guide or monitor treatment. Performed at [facility], [address], [place], [place] [address]     Labs: CBC: Recent Labs  Lab 03/03/24 1318 03/04/24 0328 03/05/24 0344 03/06/24 0335 03/07/24 0356 03/08/24 0348  WBC 12.4* 10.3 9.5 11.8* 9.8 8.5  NEUTROABS 9.7*  --   --   --   --   --   HGB 13.8 11.2* 10.9* 10.0* 8.9* 8.9*  HCT 41.7 34.9* 33.8* 30.9* 27.8* 28.5*  MCV 95.6 97.2 98.5 99.4 97.5 101.4*  PLT 168 152 128* 118* 133* 127*   Basic Metabolic Panel: Recent Labs  Lab 03/04/24 0328 03/05/24 0344 03/06/24 0335 03/07/24 0356 03/08/24 0348  NA 136 134* 133* 135 131*  K 4.5 3.9 4.4 4.6 4.1  CL 104 105 105 106 102  CO2 24 22 21* 24 23  GLUCOSE 165* 224* 144* 130* 148*  BUN 28* 21 19 22 22   CREATININE 0.95 0.99 0.85 0.68 0.75  CALCIUM  9.7 8.7* 8.8*  8.8* 8.4*   Liver Function Tests: Recent Labs  Lab 03/03/24 1318 03/08/24 0348  AST 31 90*  ALT 20 78*  ALKPHOS 59 115  BILITOT 0.7 0.9  PROT 6.4* 5.2*  ALBUMIN 4.2 2.6*   CBG: No results for input(s): "GLUCAP" in the last 168 hours.  Discharge time spent: less than 30 minutes.  Signed: Cherylle Corwin, MD [facility] 03/08/2024

## 2024-03-08 NOTE — TOC Transition Note (Signed)
 Transition of Care [facility]) - Discharge Note   Patient Details  Name: Megan Reilly MRN: [id number] Date of Birth: 08/07/1941  [facility] CM/SW Contact:  Megan Leys, RN Phone Number: 03/08/2024, 2:45 PM   Clinical Narrative:   DC order to SNF [facility]), RM 506, Call Report [phone], patient notified her daughter, Megan Reilly for transport. No further TOC needs.     Final next level of care: Skilled Nursing Facility Barriers to Discharge: Barriers Resolved   Patient Goals and CMS Choice Patient states their goals for this hospitalization and ongoing recovery are:: return home CMS Medicare.gov Compare Post Acute Care list provided to:: Patient Choice offered to / list presented to : Patient [place] ownership interest in [facility].provided to:: Patient    Discharge Placement              Patient chooses bed at: [facility] Patient to be transferred to facility by: [facility] Name of family member notified: Patient notified her daughter Patient and family notified of of transfer: 03/08/24  Discharge Plan and Services Additional resources added to the After Visit Summary for                                       Social Drivers of Health (SDOH) Interventions SDOH Screenings   Food Insecurity: No Food Insecurity (03/04/2024)  Housing: Low Risk  (03/04/2024)  Transportation Needs: No Transportation Needs (03/04/2024)  Utilities: Not At Risk (03/04/2024)  Alcohol Screen: Low Risk  (08/19/2023)  Depression (PHQ2-9): Low Risk  (07/18/2023)  Financial Resource Strain: Low Risk  (07/17/2023)  Physical Activity: Sufficiently Active (07/17/2023)  Social Connections: Moderately Integrated (03/04/2024)  Stress: Stress Concern Present (07/17/2023)  Tobacco Use: Low Risk  (03/04/2024)  Health Literacy: Adequate Health Literacy (07/18/2023)     Readmission Risk Interventions    03/06/2024   12:52 PM  Readmission Risk Prevention Plan  Post Dischage  Appt Complete  Medication Screening Complete  Transportation Screening Complete

## 2024-03-08 NOTE — Progress Notes (Signed)
   Subjective: 3 Days Post-Op Procedure(s) (LRB): ARTHROPLASTY, HIP, TOTAL, ANTERIOR APPROACH (Left) Patient seen in rounds for Dr. France Ina. Patient reports the hip is doing well. She notes difficulty with ambulation due to the elbow and unable to utilize walker easily.   Objective: Vital signs in last 24 hours: Temp:  [97.8 F (36.6 C)-98.9 F (37.2 C)] 98.9 F (37.2 C) (05/29 0548) Pulse Rate:  [81-96] 81 (05/29 0548) Resp:  [16-17] 17 (05/29 0548) BP: (118-128)/(59-63) 128/63 (05/29 0548) SpO2:  [97 %-99 %] 97 % (05/29 0548)  Intake/Output from previous day:  Intake/Output Summary (Last 24 hours) at 03/08/2024 0839 Last data filed at 03/08/2024 0809 Gross per 24 hour  Intake 1540 ml  Output --  Net 1540 ml    Intake/Output this shift: Total I/O In: 240 [P.O.:240] Out: -   Labs: Recent Labs    03/06/24 0335 03/07/24 0356 03/08/24 0348  HGB 10.0* 8.9* 8.9*   Recent Labs    03/07/24 0356 03/08/24 0348  WBC 9.8 8.5  RBC 2.85* 2.81*  HCT 27.8* 28.5*  PLT 133* 127*   Recent Labs    03/07/24 0356 03/08/24 0348  NA 135 131*  K 4.6 4.1  CL 106 102  CO2 24 23  BUN 22 22  CREATININE 0.68 0.75  GLUCOSE 130* 148*  CALCIUM 8.8* 8.4*   No results for input(s): "LABPT", "INR" in the last 72 hours.  Exam: General - Patient is Alert and Oriented Extremity - Neurologically intact Neurovascular intact Sensation intact distally Dorsiflexion/Plantar flexion intact Dressing/Incision - clean, dry, no drainage Motor Function - intact, moving foot and toes well on exam.  Past Medical History:  Diagnosis Date   Allergy    Arthritis    Blood transfusion abn reaction or complication, no procedure mishap    83 years old after tonsilectomy   Hypertension    Osteoporosis 04/2019   T score -2.1 improved from prior DEXA   Tibia fracture    left   Vasovagal syncope    2 prior incidences last was in 2015 ; treated in ED , reports no recurrence since then      Assessment/Plan: 3 Days Post-Op Procedure(s) (LRB): ARTHROPLASTY, HIP, TOTAL, ANTERIOR APPROACH (Left) Principal Problem:   Closed left hip fracture (HCC) Active Problems:   Essential hypertension   Olecranon fracture, left, closed, initial encounter   Radial neck fracture   Fall  Estimated body mass index is 24 kg/m as calculated from the following:   Height as of this encounter: 5\' 8"  (1.727 m).   Weight as of this encounter: 71.6 kg.  DVT Prophylaxis - Aspirin  Weight-bearing as tolerated.  Continue physical therapy while admitted. Discharge to SNF when bed ready. F/u in clinic in 2 weeks. Scripts printed in chart.  The PDMP database was reviewed today prior to any opioid medications being prescribed to this patient.  R. Brinton Canavan, PA-C Orthopedic Surgery 03/08/2024, 8:39 AM

## 2024-03-08 NOTE — Progress Notes (Signed)
 Called [facility] to give report, placed on hold and cut off. Will call back.

## 2024-03-09 DIAGNOSIS — M810 Age-related osteoporosis without current pathological fracture: Secondary | ICD-10-CM | POA: Diagnosis not present

## 2024-03-09 DIAGNOSIS — M1712 Unilateral primary osteoarthritis, left knee: Secondary | ICD-10-CM | POA: Diagnosis not present

## 2024-03-09 DIAGNOSIS — D649 Anemia, unspecified: Secondary | ICD-10-CM | POA: Diagnosis not present

## 2024-03-09 DIAGNOSIS — D429 Neoplasm of uncertain behavior of meninges, unspecified: Secondary | ICD-10-CM | POA: Diagnosis not present

## 2024-03-12 ENCOUNTER — Other Ambulatory Visit: Payer: Self-pay | Admitting: Family Medicine

## 2024-03-12 ENCOUNTER — Ambulatory Visit: Payer: Self-pay

## 2024-03-12 DIAGNOSIS — M79672 Pain in left foot: Secondary | ICD-10-CM | POA: Diagnosis not present

## 2024-03-12 DIAGNOSIS — R600 Localized edema: Secondary | ICD-10-CM | POA: Diagnosis not present

## 2024-03-12 NOTE — Telephone Encounter (Signed)
 Copied from CRM [id number]. Topic: Clinical - Red Word Triage >> Mar 12, 2024  8:00 AM Marlan Silva wrote: Red Word that prompted transfer to Nurse Triage: Patient called in requesting to speak with Mikell. She said she had a recent accident, fell and her blood pressure dropped. She broke her hip and had surgery last Monday, she said her blood pressure is all out of whack and she would like Dr. Darren Em to follow up with her. She is currently still in a facility. She is also stating that her legs are swollen she states its edema, and that her blood pressure has been out of whack and she is scared to go home from the facility that she is in without her blood pressure being under control.   Chief Complaint: Left Foot, Swelling Symptoms: Left Foot Swelling, Hypotension Frequency: One week ago  Pertinent Negatives: Patient denies chest pain, difficulty breathing,  Disposition: [] ED /[] Urgent Care (no appt availability in office) / [] Appointment(In office/virtual)/ []  [facility]/ [] Home Care/ [] Refused Recommended Disposition /[] [facility]/ [x]  Follow-up with PCP Additional Notes: Triage call received via telehealth platform on Date: @TODAY @ Patient/caregiver reports concerns of Other (Swelling, Left Foot, Hypotension)  Assessment conducted based on subjective report and clinical judgment within nursing scope.  Patient advised based on reported symptoms, risk factors, and urgency of presentation.  The patient is at [facility] currently on [address]. Contacted the Clinical [facility] and Spoke with Kristeen Peto, the patient then stated she did not want an appointment, she just wanted Dr. Darren Em to be aware of what was going on and to give her a call back.   Reason for Disposition  [1] MILD swelling of both ankles (i.e., pedal edema) AND [2] new-onset or worsening  Answer Assessment - Initial Assessment Questions 1. ONSET: "When did the swelling start?" (e.g., minutes, hours, days)      Last week 2. LOCATION: "What part of the leg is swollen?"  "Are both legs swollen or just one leg?"     Left foot and ankle  3. SEVERITY: "How bad is the swelling?" (e.g., localized; mild, moderate, severe)   - Localized: Small area of swelling localized to one leg.   - MILD pedal edema: Swelling limited to foot and ankle, pitting edema < 1/4 inch (6 mm) deep, rest and elevation eliminate most or all swelling.   - MODERATE edema: Swelling of lower leg to knee, pitting edema > 1/4 inch (6 mm) deep, rest and elevation only partially reduce swelling.   - SEVERE edema: Swelling extends above knee, facial or hand swelling present.      Mild  4. REDNESS: "Does the swelling look red or infected?"     Unsure  5. PAIN: "Is the swelling painful to touch?" If Yes, ask: "How painful is it?"   (Scale 1-10; mild, moderate or severe)     Mild  6. FEVER: "Do you have a fever?" If Yes, ask: "What is it, how was it measured, and when did it start?"      No  7. CAUSE: "What do you think is causing the leg swelling?"     Unsure  8. MEDICAL HISTORY: "Do you have a history of blood clots (e.g., DVT), cancer, heart failure, kidney disease, or liver failure?"     *No Answer* 9. RECURRENT SYMPTOM: "Have you had leg swelling before?" If Yes, ask: "When was the last time?" "What happened that time?"     No  10. OTHER SYMPTOMS: "Do  you have any other symptoms?" (e.g., chest pain, difficulty breathing)       No  11. PREGNANCY: "Is there any chance you are pregnant?" "When was your last menstrual period?"       No and No  Protocols used: Leg Swelling and Edema-A-AH

## 2024-03-12 NOTE — Telephone Encounter (Signed)
 I spoke with the patient and she reported that she is having edema in left ankle and her blood pressures have been occasionally low after her hip surgery. Patient is currently at [facility] care and she is undergoing PT for strengthening. Patient is currently taking Amlodipine  2.5 mg and Asprin 81. Patient reported nurses at [facility] have been monitoring blood pressures and they have been getting reading ranging around 128/60 and O2 sat of 98%. Patient wanted to inform PCP of current situation and reported she would follow up once she is discharged from [facility]. Patient inquired if she should do anything before follow up?

## 2024-03-12 NOTE — Telephone Encounter (Signed)
 Patient informed of the message below.

## 2024-03-15 ENCOUNTER — Telehealth: Payer: Self-pay | Admitting: *Deleted

## 2024-03-15 DIAGNOSIS — D649 Anemia, unspecified: Secondary | ICD-10-CM | POA: Diagnosis not present

## 2024-03-15 DIAGNOSIS — S72142D Displaced intertrochanteric fracture of left femur, subsequent encounter for closed fracture with routine healing: Secondary | ICD-10-CM | POA: Diagnosis not present

## 2024-03-15 DIAGNOSIS — M1712 Unilateral primary osteoarthritis, left knee: Secondary | ICD-10-CM | POA: Diagnosis not present

## 2024-03-15 DIAGNOSIS — D429 Neoplasm of uncertain behavior of meninges, unspecified: Secondary | ICD-10-CM | POA: Diagnosis not present

## 2024-03-15 NOTE — Telephone Encounter (Signed)
 Patient informed of the message below and f/u has been scheduled for 03/23/2024.

## 2024-03-15 NOTE — Telephone Encounter (Signed)
 Copied from CRM [id number]. Topic: Clinical - Lab/Test Results >> Mar 15, 2024  8:43 AM Alyse July wrote: Reason for CRM: Patient would like a call back to further discuss lab result.

## 2024-03-16 ENCOUNTER — Other Ambulatory Visit: Payer: Self-pay | Admitting: Family Medicine

## 2024-03-21 ENCOUNTER — Ambulatory Visit: Admitting: Family Medicine

## 2024-03-21 ENCOUNTER — Encounter: Payer: Self-pay | Admitting: Family Medicine

## 2024-03-21 VITALS — BP 130/60 | HR 71 | Temp 98.2°F

## 2024-03-21 DIAGNOSIS — S72002P Fracture of unspecified part of neck of left femur, subsequent encounter for closed fracture with malunion: Secondary | ICD-10-CM

## 2024-03-21 DIAGNOSIS — S52022D Displaced fracture of olecranon process without intraarticular extension of left ulna, subsequent encounter for closed fracture with routine healing: Secondary | ICD-10-CM | POA: Diagnosis not present

## 2024-03-21 DIAGNOSIS — I10 Essential (primary) hypertension: Secondary | ICD-10-CM

## 2024-03-21 DIAGNOSIS — D649 Anemia, unspecified: Secondary | ICD-10-CM

## 2024-03-21 MED ORDER — GEMTESA 75 MG PO TABS: 75.0000 mg | ORAL_TABLET | Freq: Every day | ORAL | 3 refills | Status: DC

## 2024-03-21 NOTE — Progress Notes (Signed)
 Established Patient Office Visit  Subjective   Patient ID: Megan Reilly, female    DOB: 1940-10-21  Age: 83 y.o. MRN: [id number]  Chief Complaint  Patient presents with   Medical Management of Chronic Issues    HPI   Ms. Puccinelli is seen for hospital follow-up following recent fall and left hip fracture.  She has history of hypertension, osteoarthritis, osteoporosis, vasovagal syncope.  Last fall prior to recent injury was 8 years ago.  She states she was out walking on the sidewalk and had what she describes a sensation that came over her suddenly.  This sounded like dizziness/presyncope and she went down.  She does not think she had total loss of consciousness.  Fortunately,  2 bystanders were nearby and observed and came to assist her.    She did not hit her head.  Unfortunately, sustained a left hip fracture.  Injury occurred on 5-24.    Patient had hip surgery.  She also had a left elbow injury with comminuted left olecranon fracture.  She has scheduled follow-up with orthopedics.  She went to [facility] for rehab for 13 days and just got out from there.  Admission hemoglobin 13.8 and discharge hemoglobin 8.9.  She has had some fatigue.  Blood pressure slightly low during hospital stay and her losartan  was held.  She remains on amlodipine  2.5 mg daily.  Denies any lightheadedness with standing.  Does not currently have home blood pressure monitor.  Past Medical History:  Diagnosis Date   Allergy    Arthritis    Blood transfusion abn reaction or complication, no procedure mishap    83 years old after tonsilectomy   Hypertension    Osteoporosis 04/2019   T score -2.1 improved from prior DEXA   Tibia fracture    left   Vasovagal syncope    2 prior incidences last was in 2015 ; treated in ED , reports no recurrence since then    Past Surgical History:  Procedure Laterality Date   COLONOSCOPY     FOOT SURGERY     bunectomy, hammertoe, bil   LAPAROSCOPIC BILATERAL SALPINGO  OOPHERECTOMY     REVERSE SHOULDER ARTHROPLASTY Left 10/14/2023   Procedure: REVERSE SHOULDER ARTHROPLASTY;  Surgeon: Winston Hawking, MD;  Location: [facility];  Service: Orthopedics;  Laterality: Left;    TONSILLECTOMY  10/12/1943   TOTAL HIP ARTHROPLASTY Left 03/05/2024   Procedure: ARTHROPLASTY, HIP, TOTAL, ANTERIOR APPROACH;  Surgeon: Liliane Rei, MD;  Location: [facility];  Service: Orthopedics;  Laterality: Left;   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   Procedure: RIGHT TOTAL KNEE ARTHROPLASTY;  Surgeon: Liliane Rei, MD;  Location: [facility];  Service: Orthopedics;  Laterality: Right;   TOTAL KNEE ARTHROPLASTY Left 08/09/2022   Procedure: TOTAL KNEE ARTHROPLASTY;  Surgeon: Liliane Rei, MD;  Location: [facility];  Service: Orthopedics;  Laterality: Left;    reports that she has never smoked. She has never been exposed to tobacco smoke. She has never used smokeless tobacco. She reports current alcohol  use of about 1.0 standard drink of alcohol  per week. She reports that she does not use drugs. family history includes Breast cancer in her maternal aunt; Diabetes in her father; Heart disease in her mother; Heart disease (age of onset: 24) in her father; Heart failure in her mother; Hyperlipidemia in her father and mother; Stroke in her mother. Allergies  Allergen Reactions   Diphenhydramine  Other (See Comments)    Light-headedness   Hydrochlorothiazide  Other (See Comments)  Fatigue, body aches    Review of Systems  Constitutional:  Positive for malaise/fatigue. Negative for chills and fever.  Eyes:  Negative for blurred vision.  Respiratory:  Negative for shortness of breath.   Cardiovascular:  Negative for chest pain.  Gastrointestinal:  Negative for abdominal pain.  Neurological:  Negative for dizziness, weakness and headaches.      Objective:     BP 130/60 (BP Location: Left Arm, Patient Position: Sitting, Cuff Size: Normal)   Pulse 71   Temp 98.2 F (36.8 C) (Oral)    SpO2 97%  BP Readings from Last 3 Encounters:  03/21/24 130/60  03/08/24 134/69  02/01/24 126/66   Wt Readings from Last 3 Encounters:  03/03/24 157 lb 13.6 oz (71.6 kg)  02/01/24 157 lb 14.4 oz (71.6 kg)  10/14/23 159 lb (72.1 kg)      Physical Exam Vitals reviewed.  Constitutional:      General: She is not in acute distress.    Appearance: She is well-developed. She is not ill-appearing.  Eyes:     Pupils: Pupils are equal, round, and reactive to light.  Neck:     Thyroid : No thyromegaly.     Vascular: No JVD.  Cardiovascular:     Rate and Rhythm: Normal rate and regular rhythm.     Heart sounds:     No gallop.  Pulmonary:     Effort: Pulmonary effort is normal. No respiratory distress.     Breath sounds: Normal breath sounds. No wheezing or rales.  Musculoskeletal:     Cervical back: Neck supple.     Comments: Mild edema left lower extremity related to recent surgery  She has extensive bruising and swelling of left elbow and forearm from recent fracture.    Neurological:     Mental Status: She is alert.      No results found for any visits on 03/21/24.  Last CBC Lab Results  Component Value Date   WBC 8.5 03/08/2024   HGB 8.9 (L) 03/08/2024   HCT 28.5 (L) 03/08/2024   MCV 101.4 (H) 03/08/2024   MCH 31.7 03/08/2024   RDW 12.8 03/08/2024   PLT 127 (L) 03/08/2024   Last metabolic panel Lab Results  Component Value Date   GLUCOSE 148 (H) 03/08/2024   NA 131 (L) 03/08/2024   K 4.1 03/08/2024   CL 102 03/08/2024   CO2 23 03/08/2024   BUN 22 03/08/2024   CREATININE 0.75 03/08/2024   GFRNONAA >60 03/08/2024   CALCIUM  8.4 (L) 03/08/2024   PHOS 2.9 12/10/2013   PROT 5.2 (L) 03/08/2024   ALBUMIN 2.6 (L) 03/08/2024   LABGLOB 2.6 06/01/2023   BILITOT 0.9 03/08/2024   ALKPHOS 115 03/08/2024   AST 90 (H) 03/08/2024   ALT 78 (H) 03/08/2024   ANIONGAP 6 03/08/2024   Last lipids Lab Results  Component Value Date   CHOL 202 (H) 08/23/2023   HDL 60.00  08/23/2023   LDLCALC 113 (H) 08/23/2023   LDLDIRECT 139.3 02/02/2011   TRIG 144.0 08/23/2023   CHOLHDL 3 08/23/2023   Last thyroid  functions Lab Results  Component Value Date   TSH 1.70 04/25/2023      The ASCVD Risk score (Arnett DK, et al., 2019) failed to calculate for the following reasons:   The 2019 ASCVD risk score is only valid for ages 77 to 10    Assessment & Plan:   #1 recent fall with left hip fracture.  Circumstances regarding her fall are not  totally clear.  Possibly related to low blood pressure.  Sounds like her symptoms came on relatively abruptly when she was in the middle of her walk.  She does not recall being dehydrated. No reported LOC.     Has progressed well with her physical therapy.  Currently ambulating with a cane.  She also has a walker for home use as needed.  Postoperative anemia with hemoglobin 8.9. Handicap sticker papers completed  #2 anemia related to recent trauma and surgery.  Recommend multivitamin with iron and iron rich diet with handout given.  Set up follow-up in 3 to 4 weeks and reassess CBC then  #3 history of hypertension.  Blood pressure today seated 120/58 and standing 108/58.  Continue to hold losartan  for now.  Continue low-dose amlodipine  2.5 mg daily.  Stay well-hydrated.  Change positions slowly.  Also recommend home blood pressure monitor to monitor blood pressure more closely and be in touch if systolic consistently over 140  #4 left olecranon fracture.  This was comminuted.  Has scheduled follow-up with orthopedics.

## 2024-03-21 NOTE — Patient Instructions (Addendum)
 Continue to HOLD the Losartan  for now.    Stay well hydrated  Change positions slowly  Stop the Myrbetriq  and start Gemtesa 75 mg once daily.   Set up one month follow up.

## 2024-03-23 ENCOUNTER — Ambulatory Visit: Admitting: Family Medicine

## 2024-03-27 DIAGNOSIS — S52022A Displaced fracture of olecranon process without intraarticular extension of left ulna, initial encounter for closed fracture: Secondary | ICD-10-CM | POA: Diagnosis not present

## 2024-03-27 DIAGNOSIS — M25522 Pain in left elbow: Secondary | ICD-10-CM | POA: Diagnosis not present

## 2024-04-04 ENCOUNTER — Telehealth: Payer: Self-pay | Admitting: Family Medicine

## 2024-04-04 NOTE — Telephone Encounter (Signed)
 Copied from CRM [id number]. Topic: General - Call Back - No Documentation >> Apr 04, 2024  4:12 PM Leah C wrote: Reason for CRM: Grayce from [facility] stated that they received a fax for the patient but it was cut off and asked if the fax can be sent again.  Sonya's contact is [phone].

## 2024-04-04 NOTE — Telephone Encounter (Signed)
 Fax sent.

## 2024-04-05 DIAGNOSIS — Z5189 Encounter for other specified aftercare: Secondary | ICD-10-CM | POA: Diagnosis not present

## 2024-04-15 ENCOUNTER — Other Ambulatory Visit: Payer: Self-pay | Admitting: Family Medicine

## 2024-04-20 ENCOUNTER — Ambulatory Visit: Payer: Self-pay | Admitting: Family Medicine

## 2024-04-20 ENCOUNTER — Encounter: Payer: Self-pay | Admitting: Family Medicine

## 2024-04-20 ENCOUNTER — Ambulatory Visit (INDEPENDENT_AMBULATORY_CARE_PROVIDER_SITE_OTHER): Admitting: Family Medicine

## 2024-04-20 VITALS — BP 134/66 | HR 71 | Temp 98.3°F | Wt 160.5 lb

## 2024-04-20 DIAGNOSIS — S52022D Displaced fracture of olecranon process without intraarticular extension of left ulna, subsequent encounter for closed fracture with routine healing: Secondary | ICD-10-CM | POA: Diagnosis not present

## 2024-04-20 DIAGNOSIS — I10 Essential (primary) hypertension: Secondary | ICD-10-CM | POA: Diagnosis not present

## 2024-04-20 DIAGNOSIS — D649 Anemia, unspecified: Secondary | ICD-10-CM

## 2024-04-20 LAB — CBC WITH DIFFERENTIAL/PLATELET
Basophils Absolute: 0 K/uL (ref 0.0–0.1)
Basophils Relative: 0.5 % (ref 0.0–3.0)
Eosinophils Absolute: 0.4 K/uL (ref 0.0–0.7)
Eosinophils Relative: 4.5 % (ref 0.0–5.0)
HCT: 36.7 % (ref 36.0–46.0)
Hemoglobin: 12.2 g/dL (ref 12.0–15.0)
Lymphocytes Relative: 23.3 % (ref 12.0–46.0)
Lymphs Abs: 2 K/uL (ref 0.7–4.0)
MCHC: 33.1 g/dL (ref 30.0–36.0)
MCV: 94.1 fl (ref 78.0–100.0)
Monocytes Absolute: 0.7 K/uL (ref 0.1–1.0)
Monocytes Relative: 8.1 % (ref 3.0–12.0)
Neutro Abs: 5.6 K/uL (ref 1.4–7.7)
Neutrophils Relative %: 63.6 % (ref 43.0–77.0)
Platelets: 200 K/uL (ref 150.0–400.0)
RBC: 3.9 Mil/uL (ref 3.87–5.11)
RDW: 13.8 % (ref 11.5–15.5)
WBC: 8.7 K/uL (ref 4.0–10.5)

## 2024-04-20 MED ORDER — AMLODIPINE BESYLATE 2.5 MG PO TABS: 2.5000 mg | ORAL_TABLET | Freq: Every day | ORAL | 3 refills | Status: AC

## 2024-04-20 NOTE — Progress Notes (Signed)
 Established Patient Office Visit  Subjective   Patient ID: Megan Reilly, female    DOB: [birth year]  Age: 83 y.o. MRN: [id number]  Chief Complaint  Patient presents with   Medical Management of Chronic Issues    HPI   Megan Reilly is seen for chronic medical follow-up accompanied by daughter.  She is still recovering from injuries from fall in April.  She had comminuted fracture left elbow as well as hip fracture of the left hip.  She has recovered extremely well from her hip surgery and is ambulating very well at this time.  She decided against any left elbow surgery which was offered.  She cannot quite fully extend the elbow but has good functional use of her left upper extremity this time.  She is right-hand dominant.  She has hypertension and is currently taking just low-dose amlodipine  and had previously been on losartan  but this has been held.  Last visit she had some drop in blood pressure with standing.  She is changing positions slowly.  Staying well-hydrated.  Home blood pressure readings reviewed and vary considerably but most of these been fairly well-controlled on amlodipine .  Occasional readings of 140 but not consistently.  She still been doing some home exercises for lower extremity strengthening.  She had anemia with hemoglobin 8.9 after her surgery for hip and is requesting follow-up CBC today.  Has had good appetite and eating fairly well.  Past Medical History:  Diagnosis Date   Allergy    Arthritis    Blood transfusion abn reaction or complication, no procedure mishap    83 years old after tonsilectomy   Hypertension    Osteoporosis 04/2019   T score -2.1 improved from prior DEXA   Tibia fracture    left   Vasovagal syncope    2 prior incidences last was in 2015 ; treated in ED , reports no recurrence since then    Past Surgical History:  Procedure Laterality Date   COLONOSCOPY     FOOT SURGERY     bunectomy, hammertoe, bil   LAPAROSCOPIC BILATERAL  SALPINGO OOPHERECTOMY     REVERSE SHOULDER ARTHROPLASTY Left 10/14/2023   Procedure: REVERSE SHOULDER ARTHROPLASTY;  Surgeon: Kay Kemps, MD;  Location: [facility];  Service: Orthopedics;  Laterality: Left;    TONSILLECTOMY  10/12/1943   TOTAL HIP ARTHROPLASTY Left 03/05/2024   Procedure: ARTHROPLASTY, HIP, TOTAL, ANTERIOR APPROACH;  Surgeon: Melodi Lerner, MD;  Location: [facility];  Service: Orthopedics;  Laterality: Left;   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   Procedure: RIGHT TOTAL KNEE ARTHROPLASTY;  Surgeon: Melodi Lerner, MD;  Location: [facility];  Service: Orthopedics;  Laterality: Right;   TOTAL KNEE ARTHROPLASTY Left 08/09/2022   Procedure: TOTAL KNEE ARTHROPLASTY;  Surgeon: Melodi Lerner, MD;  Location: [facility];  Service: Orthopedics;  Laterality: Left;    reports that she has never smoked. She has never been exposed to tobacco smoke. She has never used smokeless tobacco. She reports current alcohol  use of about 1.0 standard drink of alcohol  per week. She reports that she does not use drugs. family history includes Breast cancer in her maternal aunt; Diabetes in her father; Heart disease in her mother; Heart disease (age of onset: 68) in her father; Heart failure in her mother; Hyperlipidemia in her father and mother; Stroke in her mother. Allergies  Allergen Reactions   Diphenhydramine  Other (See Comments)    Light-headedness   Hydrochlorothiazide  Other (See Comments)    Fatigue, body aches  Review of Systems  Constitutional:  Negative for malaise/fatigue.  Eyes:  Negative for blurred vision.  Respiratory:  Negative for shortness of breath.   Cardiovascular:  Negative for chest pain.  Gastrointestinal:  Negative for abdominal pain.  Neurological:  Negative for dizziness, weakness and headaches.      Objective:     BP 134/66 (BP Location: Left Arm, Patient Position: Sitting, Cuff Size: Normal)   Pulse 71   Temp 98.3 F (36.8 C) (Oral)   Wt 160 lb 8 oz (72.8 kg)    SpO2 96%   BMI 24.40 kg/m  BP Readings from Last 3 Encounters:  04/20/24 134/66  03/21/24 130/60  03/08/24 134/69   Wt Readings from Last 3 Encounters:  04/20/24 160 lb 8 oz (72.8 kg)  03/03/24 157 lb 13.6 oz (71.6 kg)  02/01/24 157 lb 14.4 oz (71.6 kg)      Physical Exam Vitals reviewed.  Constitutional:      General: She is not in acute distress.    Appearance: She is not ill-appearing.  Cardiovascular:     Rate and Rhythm: Normal rate and regular rhythm.  Pulmonary:     Effort: Pulmonary effort is normal.     Breath sounds: Normal breath sounds. No wheezing or rales.  Musculoskeletal:     Right lower leg: No edema.     Left lower leg: No edema.      No results found for any visits on 04/20/24.  Last CBC Lab Results  Component Value Date   WBC 8.5 03/08/2024   HGB 8.9 (L) 03/08/2024   HCT 28.5 (L) 03/08/2024   MCV 101.4 (H) 03/08/2024   MCH 31.7 03/08/2024   RDW 12.8 03/08/2024   PLT 127 (L) 03/08/2024   Last metabolic panel Lab Results  Component Value Date   GLUCOSE 148 (H) 03/08/2024   NA 131 (L) 03/08/2024   K 4.1 03/08/2024   CL 102 03/08/2024   CO2 23 03/08/2024   BUN 22 03/08/2024   CREATININE 0.75 03/08/2024   GFRNONAA >60 03/08/2024   CALCIUM  8.4 (L) 03/08/2024   PHOS 2.9 12/10/2013   PROT 5.2 (L) 03/08/2024   ALBUMIN 2.6 (L) 03/08/2024   LABGLOB 2.6 06/01/2023   BILITOT 0.9 03/08/2024   ALKPHOS 115 03/08/2024   AST 90 (H) 03/08/2024   ALT 78 (H) 03/08/2024   ANIONGAP 6 03/08/2024      The ASCVD Risk score (Arnett DK, et al., 2019) failed to calculate for the following reasons:   The 2019 ASCVD risk score is only valid for ages 75 to 66    Assessment & Plan:   #1 recent postoperative anemia.  Patient requesting repeat CBC today.  This will be obtained.  Hopefully hemoglobin improving.  Continue iron rich diet  #2 hypertension.  Currently controlled with low-dose amlodipine .  Continue to hold losartan  for now.  Continue to  monitor at home and be in touch if systolic readings consistently over 140  #3 recent left olecranon process fracture.  This was comminuted.  Patient decided against surgery.  No associated pain at this point and appears to be healing fairly well.  No major functional limitations  No follow-ups on file.    [name], MD

## 2024-04-23 ENCOUNTER — Telehealth: Payer: Self-pay

## 2024-04-23 NOTE — Telephone Encounter (Signed)
 Copied from CRM [id number]. Topic: Clinical - Medication Question >> Apr 23, 2024  8:52 AM Geroldine GRADE wrote: Reason for CRM: Patient is calling in because she was unaware her amlodipine  was dropped to 2.5mg . She is just making sure it's correct

## 2024-04-23 NOTE — Telephone Encounter (Signed)
 Patient informed of message below.

## 2024-04-27 ENCOUNTER — Other Ambulatory Visit: Payer: Self-pay | Admitting: Family Medicine

## 2024-04-27 DIAGNOSIS — Z1231 Encounter for screening mammogram for malignant neoplasm of breast: Secondary | ICD-10-CM

## 2024-05-01 NOTE — Progress Notes (Signed)
   05/01/2024  Patient ID: Megan Reilly, female   DOB: May 02, 1941, 83 y.o.   MRN: [id number]  Pharmacy Quality Measure Review  This patient is appearing on a report for being at risk of failing the adherence measure for hypertension (ACEi/ARB) medications this calendar year.   Medication: Losartan  50mg  Last fill date: 12/11/23 for 90 day supply  Medication has been discontinued. No further action needed at this time.  Jon VEAR Lindau, PharmD Clinical Pharmacist [phone]

## 2024-05-16 ENCOUNTER — Encounter: Payer: Self-pay | Admitting: Family Medicine

## 2024-05-16 ENCOUNTER — Ambulatory Visit (INDEPENDENT_AMBULATORY_CARE_PROVIDER_SITE_OTHER): Admitting: Family Medicine

## 2024-05-16 VITALS — BP 140/70 | HR 67 | Temp 98.6°F | Wt 159.1 lb

## 2024-05-16 DIAGNOSIS — H6123 Impacted cerumen, bilateral: Secondary | ICD-10-CM | POA: Diagnosis not present

## 2024-05-16 NOTE — Progress Notes (Unsigned)
 Established Patient Office Visit  Subjective   Patient ID: Megan Reilly, female    DOB: 03/11/1941  Age: 83 y.o. MRN: [id number]  Chief Complaint  Patient presents with   Cerumen Impaction    HPI  {History (Optional):23778} Megan Reilly is seen for cerumen impaction bilaterally.  She went to audiology yesterday for hearing check and was told that she would need to get her ears cleared out first.  She has had chronic hearing loss for years and is looking at possible change of hearing aids.  No recent ear drainage.  No ear pain.  Past Medical History:  Diagnosis Date   Allergy    Arthritis    Blood transfusion abn reaction or complication, no procedure mishap    83 years old after tonsilectomy   Hypertension    Osteoporosis 04/2019   T score -2.1 improved from prior DEXA   Tibia fracture    left   Vasovagal syncope    2 prior incidences last was in 2015 ; treated in ED , reports no recurrence since then    Past Surgical History:  Procedure Laterality Date   COLONOSCOPY     FOOT SURGERY     bunectomy, hammertoe, bil   LAPAROSCOPIC BILATERAL SALPINGO OOPHERECTOMY     REVERSE SHOULDER ARTHROPLASTY Left 10/14/2023   Procedure: REVERSE SHOULDER ARTHROPLASTY;  Surgeon: Kay Kemps, MD;  Location: [facility];  Service: Orthopedics;  Laterality: Left;    TONSILLECTOMY  10/12/1943   TOTAL HIP ARTHROPLASTY Left 03/05/2024   Procedure: ARTHROPLASTY, HIP, TOTAL, ANTERIOR APPROACH;  Surgeon: Melodi Lerner, MD;  Location: [facility];  Service: Orthopedics;  Laterality: Left;   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   Procedure: RIGHT TOTAL KNEE ARTHROPLASTY;  Surgeon: Melodi Lerner, MD;  Location: [facility];  Service: Orthopedics;  Laterality: Right;   TOTAL KNEE ARTHROPLASTY Left 08/09/2022   Procedure: TOTAL KNEE ARTHROPLASTY;  Surgeon: Melodi Lerner, MD;  Location: [facility];  Service: Orthopedics;  Laterality: Left;    reports that she has never smoked. She has never been exposed  to tobacco smoke. She has never used smokeless tobacco. She reports current alcohol  use of about 1.0 standard drink of alcohol  per week. She reports that she does not use drugs. family history includes Breast cancer in her maternal aunt; Diabetes in her father; Heart disease in her mother; Heart disease (age of onset: 55) in her father; Heart failure in her mother; Hyperlipidemia in her father and mother; Stroke in her mother. Allergies  Allergen Reactions   Diphenhydramine  Other (See Comments)    Light-headedness   Hydrochlorothiazide  Other (See Comments)    Fatigue, body aches    Review of Systems  Constitutional:  Negative for chills and fever.  HENT:  Positive for hearing loss. Negative for ear discharge, ear pain and tinnitus.       Objective:     BP (!) 140/70   Pulse 67   Temp 98.6 F (37 C) (Oral)   Wt 159 lb 1.6 oz (72.2 kg)   SpO2 96%   BMI 24.19 kg/m  {Vitals History (Optional):23777}  Physical Exam Vitals reviewed.  Constitutional:      General: She is not in acute distress.    Appearance: She is not ill-appearing.  HENT:     Ears:     Comments: Cerumen impaction bilaterally Cardiovascular:     Rate and Rhythm: Normal rate and regular rhythm.  Neurological:     Mental Status: She is alert.  No results found for any visits on 05/16/24.  {Labs (Optional):23779}  The ASCVD Risk score (Arnett DK, et al., 2019) failed to calculate for the following reasons:   The 2019 ASCVD risk score is only valid for ages 80 to 12    Assessment & Plan:   Problem List Items Addressed This Visit   None   No follow-ups on file.    [name], MD

## 2024-06-05 ENCOUNTER — Telehealth: Payer: Self-pay

## 2024-06-05 MED ORDER — CEPHALEXIN 500 MG PO CAPS: 500.0000 mg | ORAL_CAPSULE | Freq: Three times a day (TID) | ORAL | 0 refills | Status: AC

## 2024-06-05 NOTE — Telephone Encounter (Signed)
Patient aware and rx sent  

## 2024-06-05 NOTE — Addendum Note (Signed)
 Addended by: METTA KRISTEN CROME on: 06/05/2024 05:03 PM   Modules accepted: Orders

## 2024-06-05 NOTE — Telephone Encounter (Signed)
 I spoke with the and she reported Dysuria, x1 day. I informed the patient that a visit will be needed to check her urine if office. Patient declined visit on 06/07/2025 as she states she prefers to see PCP. Patient was advised to visit nearest Urgent care for treatment. Patient wanted to first inquire if PCP is willing to call in an antibiotic for her and she will schedule an appointment with PCP on 06/08/2024 to follow up regarding UTI and Hypertension.

## 2024-06-05 NOTE — Telephone Encounter (Signed)
 Copied from CRM #[id number]. Topic: Clinical - Medical Advice >> Jun 05, 2024 10:19 AM Chasity T wrote: Reason for CRM: Patient is calling to see if Dr Lavonne can calling in antibiotics for UTI. Checked schedule and he doesn't have anything for this afternoon. She would like for someone to call her back at [phone]

## 2024-06-08 ENCOUNTER — Ambulatory Visit (INDEPENDENT_AMBULATORY_CARE_PROVIDER_SITE_OTHER): Admitting: Family Medicine

## 2024-06-08 ENCOUNTER — Encounter: Payer: Self-pay | Admitting: Family Medicine

## 2024-06-08 VITALS — BP 124/64 | HR 83 | Temp 98.3°F | Wt 156.6 lb

## 2024-06-08 DIAGNOSIS — I10 Essential (primary) hypertension: Secondary | ICD-10-CM | POA: Diagnosis not present

## 2024-06-08 NOTE — Progress Notes (Signed)
 Established Patient Office Visit  Subjective   Patient ID: Megan Reilly, female    DOB: 1941-02-04  Age: 83 y.o. MRN: [id number]  Chief Complaint  Patient presents with   Medical Management of Chronic Issues    HPI   Megan Reilly is seen today for follow-up regarding hypertension.  She is currently on amlodipine  2.5 mg once daily.  She has had some sporadic readings that have been above 140 systolic but most of her home readings that she brings in today are better than that.  She has had some readings below 120 systolic.  She has had tendencies toward orthostasis and orthostatic dizziness in the past.  Her blood pressures have tended to drop somewhat with standing in the past.  She denies any current orthostatic symptoms.  Recent UTI symptoms and we elected to go ahead and cover empirically with Keflex .  Her symptoms are improved today.  No burning with urination.  No fever.  Past Medical History:  Diagnosis Date   Allergy    Arthritis    Blood transfusion abn reaction or complication, no procedure mishap    83 years old after tonsilectomy   Hypertension    Osteoporosis 04/2019   T score -2.1 improved from prior DEXA   Tibia fracture    left   Vasovagal syncope    2 prior incidences last was in 2015 ; treated in ED , reports no recurrence since then    Past Surgical History:  Procedure Laterality Date   COLONOSCOPY     FOOT SURGERY     bunectomy, hammertoe, bil   LAPAROSCOPIC BILATERAL SALPINGO OOPHERECTOMY     REVERSE SHOULDER ARTHROPLASTY Left 10/14/2023   Procedure: REVERSE SHOULDER ARTHROPLASTY;  Surgeon: Kay Kemps, MD;  Location: [facility];  Service: Orthopedics;  Laterality: Left;    TONSILLECTOMY  10/12/1943   TOTAL HIP ARTHROPLASTY Left 03/05/2024   Procedure: ARTHROPLASTY, HIP, TOTAL, ANTERIOR APPROACH;  Surgeon: Melodi Lerner, MD;  Location: [facility];  Service: Orthopedics;  Laterality: Left;   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   Procedure: RIGHT  TOTAL KNEE ARTHROPLASTY;  Surgeon: Melodi Lerner, MD;  Location: [facility];  Service: Orthopedics;  Laterality: Right;   TOTAL KNEE ARTHROPLASTY Left 08/09/2022   Procedure: TOTAL KNEE ARTHROPLASTY;  Surgeon: Melodi Lerner, MD;  Location: [facility];  Service: Orthopedics;  Laterality: Left;    reports that she has never smoked. She has never been exposed to tobacco smoke. She has never used smokeless tobacco. She reports current alcohol  use of about 1.0 standard drink of alcohol  per week. She reports that she does not use drugs. family history includes Breast cancer in her maternal aunt; Diabetes in her father; Heart disease in her mother; Heart disease (age of onset: 66) in her father; Heart failure in her mother; Hyperlipidemia in her father and mother; Stroke in her mother. Allergies  Allergen Reactions   Diphenhydramine  Other (See Comments)    Light-headedness   Hydrochlorothiazide  Other (See Comments)    Fatigue, body aches    Review of Systems  Constitutional:  Negative for malaise/fatigue.  Eyes:  Negative for blurred vision.  Respiratory:  Negative for shortness of breath.   Cardiovascular:  Negative for chest pain.  Neurological:  Negative for dizziness, weakness and headaches.      Objective:     BP 124/64 (BP Location: Left Arm, Cuff Size: Normal)   Pulse 83   Temp 98.3 F (36.8 C) (Oral)   Wt 156 lb 9.6 oz (  71 kg)   SpO2 97%   BMI 23.81 kg/m  BP Readings from Last 3 Encounters:  06/08/24 124/64  05/16/24 (!) 140/70  04/20/24 134/66   Wt Readings from Last 3 Encounters:  06/08/24 156 lb 9.6 oz (71 kg)  05/16/24 159 lb 1.6 oz (72.2 kg)  04/20/24 160 lb 8 oz (72.8 kg)      Physical Exam Vitals reviewed.  Constitutional:      General: She is not in acute distress.    Appearance: She is well-developed.  Eyes:     Pupils: Pupils are equal, round, and reactive to light.  Neck:     Thyroid : No thyromegaly.     Vascular: No JVD.  Cardiovascular:     Rate and  Rhythm: Normal rate and regular rhythm.     Heart sounds:     No gallop.  Pulmonary:     Effort: Pulmonary effort is normal. No respiratory distress.     Breath sounds: Normal breath sounds. No wheezing or rales.  Musculoskeletal:     Right lower leg: No edema.     Left lower leg: No edema.  Neurological:     Mental Status: She is alert.      No results found for any visits on 06/08/24.    The ASCVD Risk score (Arnett DK, et al., 2019) failed to calculate for the following reasons:   The 2019 ASCVD risk score is only valid for ages 33 to 31    Assessment & Plan:   Hypertension-stable.  Obtained reading today left arm standing with normal-sized cuff 118/62 and seated 124/64.  These were both taken after about 10 minutes of rest.  She appears to be adequately controlled by most of her home readings as well.  She has had some sporadic elevations.  Would recommend she continue with low-sodium diet and amlodipine  2.5 mg daily.  [name], MD

## 2024-06-12 ENCOUNTER — Ambulatory Visit
Admission: RE | Admit: 2024-06-12 | Discharge: 2024-06-12 | Disposition: A | Discharge status: Home / Self Care | Source: Ambulatory Visit | Attending: Family Medicine | Admitting: Family Medicine

## 2024-06-12 DIAGNOSIS — Z1231 Encounter for screening mammogram for malignant neoplasm of breast: Secondary | ICD-10-CM

## 2024-06-16 ENCOUNTER — Other Ambulatory Visit: Payer: Self-pay | Admitting: Family Medicine

## 2024-07-20 ENCOUNTER — Ambulatory Visit: Payer: Medicare HMO

## 2024-07-20 VITALS — BP 122/62 | HR 60 | Temp 98.6°F | Ht 68.0 in | Wt 152.0 lb

## 2024-07-20 DIAGNOSIS — Z0000 Encounter for general adult medical examination without abnormal findings: Secondary | ICD-10-CM

## 2024-07-20 NOTE — Progress Notes (Signed)
 Subjective:   Megan Reilly is a 83 y.o. who presents for a Medicare Wellness preventive visit.  As a reminder, Annual Wellness Visits don't include a physical exam, and some assessments may be limited, especially if this visit is performed virtually. We may recommend an in-person follow-up visit with your provider if needed.  Visit Complete: In person    Persons Participating in Visit: Patient.  AWV Questionnaire: No: Patient Medicare AWV questionnaire was not completed prior to this visit.  Cardiac Risk Factors include: advanced age (>75men, >63 women);hypertension     Objective:    Today's Vitals   07/20/24 1342  BP: 122/62  Pulse: 60  Temp: 98.6 F (37 C)  TempSrc: Oral  SpO2: 99%  Weight: 152 lb (68.9 kg)  Height: 5' 8 (1.727 m)   Body mass index is 23.11 kg/m.     07/20/2024    1:58 PM 03/04/2024   12:00 AM 03/03/2024   12:44 PM 10/14/2023    7:13 AM 10/03/2023    9:54 AM 07/18/2023    1:49 PM 08/09/2022   10:57 AM  Advanced Directives  Does Patient Have a Medical Advance Directive? Yes Yes Yes Yes Yes Yes No  Type of Estate agent of [place];Living will Healthcare Power of [place];Living will Healthcare Power of [place];Living will Healthcare Power of [place];Living will Healthcare Power of [place];Living will Healthcare Power of [place];Living will   Does patient want to make changes to medical advance directive? No - Patient declined No - Patient declined  No - Patient declined  No - Patient declined   Copy of Healthcare Power of Attorney in Chart? Yes - validated most recent copy scanned in chart (See row information) No - copy requested  Yes - validated most recent copy scanned in chart (See row information) No - copy requested Yes - validated most recent copy scanned in chart (See row information)   Would patient like information on creating a medical advance directive?       No - Patient declined    Current Medications  (verified) Outpatient Encounter Medications as of 07/20/2024  Medication Sig   acetaminophen  (TYLENOL ) 500 MG tablet Take 500 mg by mouth every 6 (six) hours as needed for mild pain (pain score 1-3) or headache.   amLODipine  (NORVASC ) 2.5 MG tablet Take 1 tablet (2.5 mg total) by mouth daily.   Calcium  Carb-Cholecalciferol  (CALCIUM  + D3 PO) Take 0.5 tablets by mouth daily.   cetirizine (ZYRTEC) 10 MG tablet Take 10 mg by mouth daily as needed for allergies.   Cholecalciferol  (VITAMIN D -3 PO) Take 2,000 Units by mouth daily.   fish oil-omega-3 fatty acids 1000 MG capsule Take 1 g by mouth daily.    GEMTESA  75 MG TABS TAKE 1 TABLET BY MOUTH EVERY DAY   latanoprost  (XALATAN ) 0.005 % ophthalmic solution Place 1 drop into both eyes at bedtime.   Magnesium  400 MG CAPS Take 400 mg by mouth daily. Triple   melatonin 5 MG TABS Take 5 mg by mouth at bedtime as needed (for sleep).   Multiple Vitamin (MULTIVITAMIN WITH MINERALS) TABS tablet Take 1 tablet by mouth every morning.   SYSTANE COMPLETE 0.6 % SOLN Place 1 drop into both eyes 3 (three) times daily as needed (for dryness).   triamcinolone  cream (KENALOG ) 0.1 % Apply 1 Application topically 2 (two) times daily as needed (for itching).   No facility-administered encounter medications on file as of 07/20/2024.    Allergies (verified) Diphenhydramine  and Hydrochlorothiazide   History: Past Medical History:  Diagnosis Date   Allergy    Arthritis    Blood transfusion abn reaction or complication, no procedure mishap    83 years old after tonsilectomy   Hypertension    Osteoporosis 04/2019   T score -2.1 improved from prior DEXA   Tibia fracture    left   Vasovagal syncope    2 prior incidences last was in 2015 ; treated in ED , reports no recurrence since then    Past Surgical History:  Procedure Laterality Date   COLONOSCOPY     FOOT SURGERY     bunectomy, hammertoe, bil   LAPAROSCOPIC BILATERAL SALPINGO OOPHERECTOMY     REVERSE  SHOULDER ARTHROPLASTY Left 10/14/2023   Procedure: REVERSE SHOULDER ARTHROPLASTY;  Surgeon: Kay Kemps, MD;  Location: [facility];  Service: Orthopedics;  Laterality: Left;    TONSILLECTOMY  10/12/1943   TOTAL HIP ARTHROPLASTY Left 03/05/2024   Procedure: ARTHROPLASTY, HIP, TOTAL, ANTERIOR APPROACH;  Surgeon: Melodi Lerner, MD;  Location: [facility];  Service: Orthopedics;  Laterality: Left;   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   Procedure: RIGHT TOTAL KNEE ARTHROPLASTY;  Surgeon: Melodi Lerner, MD;  Location: [facility];  Service: Orthopedics;  Laterality: Right;   TOTAL KNEE ARTHROPLASTY Left 08/09/2022   Procedure: TOTAL KNEE ARTHROPLASTY;  Surgeon: Melodi Lerner, MD;  Location: [facility];  Service: Orthopedics;  Laterality: Left;   Family History  Problem Relation Age of Onset   Heart disease Mother    Hyperlipidemia Mother    Stroke Mother    Heart failure Mother    Diabetes Father        type ll   Heart disease Father 3       sudden death age 74   Hyperlipidemia Father    Breast cancer Maternal Aunt    Social History   Socioeconomic History   Marital status: Divorced    Spouse name: Not on file   Number of children: 5   Years of education: Not on file   Highest education level: Bachelor's degree (e.g., BA, AB, BS)  Occupational History   Not on file  Tobacco Use   Smoking status: Never    Passive exposure: Never   Smokeless tobacco: Never  Vaping Use   Vaping status: Never Used  Substance and Sexual Activity   Alcohol  use: Yes    Alcohol /week: 1.0 standard drink of alcohol     Types: 1 Glasses of wine per week    Comment: occ   Drug use: No   Sexual activity: Not Currently    Birth control/protection: Surgical    Comment: BSO ,INTERCOURSE AGE 34, SEXUAL PARTNERS LESS THAN 5  Other Topics Concern   Not on file  Social History Narrative   Not on file   Social Drivers of Health   Financial Resource Strain: Low Risk  (07/20/2024)   Overall Financial Resource  Strain (CARDIA)    Difficulty of Paying Living Expenses: Not hard at all  Food Insecurity: No Food Insecurity (07/20/2024)   Hunger Vital Sign    Worried About Running Out of Food in the Last Year: Never true    Ran Out of Food in the Last Year: Never true  Transportation Needs: No Transportation Needs (07/20/2024)   PRAPARE - Administrator, Civil Service (Medical): No    Lack of Transportation (Non-Medical): No  Physical Activity: Sufficiently Active (07/20/2024)   Exercise Vital Sign    Days of Exercise per Week: 5  days    Minutes of Exercise per Session: 60 min  Stress: No Stress Concern Present (07/20/2024)   [employer] of Occupational Health - Occupational Stress Questionnaire    Feeling of Stress: Not at all  Social Connections: Moderately Integrated (07/20/2024)   Social Connection and Isolation Panel    Frequency of Communication with Friends and Family: More than three times a week    Frequency of Social Gatherings with Friends and Family: More than three times a week    Attends Religious Services: More than 4 times per year    Active Member of [employer] or Organizations: Yes    Attends Engineer, structural: More than 4 times per year    Marital Status: Divorced    Tobacco Counseling Counseling given: Not Answered    Clinical Intake:  Pre-visit preparation completed: Yes  Pain : No/denies pain     BMI - recorded: 23.11 Nutritional Status: BMI of 19-24  Normal Nutritional Risks: None Diabetes: No  Lab Results  Component Value Date   HGBA1C 5.8 (H) 12/10/2013     How often do you need to have someone help you when you read instructions, pamphlets, or other written materials from your doctor or pharmacy?: 1 - Never  Interpreter Needed?: No  Information entered by :: Rojelio Blush LPN   Activities of Daily Living     07/20/2024    1:56 PM 03/04/2024   12:00 AM  In your present state of health, do you have any difficulty  performing the following activities:  Hearing? 1 1  Comment Wears Hearing Aids   Vision? 0 0  Difficulty concentrating or making decisions? 0 0  Walking or climbing stairs? 0   Dressing or bathing? 0   Doing errands, shopping? 0 0  Preparing Food and eating ? N   Using the Toilet? N   In the past six months, have you accidently leaked urine? N   Do you have problems with loss of bowel control? N   Managing your Medications? N   Managing your Finances? N   Housekeeping or managing your Housekeeping? N     Patient Care Team: Micheal Wolm ORN, MD as PCP - General (Family Medicine) Livingston Rigg, MD as Consulting Physician (Dermatology) Caleen Dirks, MD as Consulting Physician (Internal Medicine)  I have updated your Care Teams any recent Medical Services you may have received from other providers in the past year.     Assessment:   This is a routine wellness examination for [place].  Hearing/Vision screen Hearing Screening - Comments:: Wears Hearing Aids Vision Screening - Comments:: Wears rx glasses - up to date with routine eye exams with  Dr Patrcia   Goals Addressed               This Visit's Progress     Continue physical activity (pt-stated)        Remain active       Depression Screen     07/20/2024    1:42 PM 07/18/2023    1:43 PM 04/11/2023    1:03 PM 03/28/2023    4:11 PM 07/21/2022    8:16 AM 07/13/2022   11:28 AM 12/29/2021    3:56 PM  PHQ 2/9 Scores  PHQ - 2 Score 0 0 1  0 0 1  PHQ- 9 Score   8    5  Exception Documentation    Patient refusal       Fall Risk     07/20/2024  1:57 PM 07/18/2023    1:58 PM 07/17/2023    2:43 PM 04/28/2023    1:04 PM 04/11/2023    1:02 PM  Fall Risk   Falls in the past year? 1 0 0 0 0  Number falls in past yr: 0 0 0  0  Injury with Fall? 1 0 0  0  Comment Fx Left Hip. Followed by medical attention      Risk for fall due to : No Fall Risks No Fall Risks No Fall Risks  Impaired balance/gait  Follow up Falls  evaluation completed Falls prevention discussed Falls prevention discussed  Falls evaluation completed    MEDICARE RISK AT HOME:  Medicare Risk at Home Any stairs in or around the home?: No If so, are there any without handrails?: No Home free of loose throw rugs in walkways, pet beds, electrical cords, etc?: Yes Adequate lighting in your home to reduce risk of falls?: Yes Life alert?: No Use of a cane, walker or w/c?: No Grab bars in the bathroom?: Yes Shower chair or bench in shower?: No Elevated toilet seat or a handicapped toilet?: Yes  TIMED UP AND GO:  Was the test performed?  Yes  Length of time to ambulate 10 feet: 10 sec Gait steady and fast without use of assistive device  Cognitive Function: 6CIT completed        07/20/2024    1:58 PM 07/18/2023    1:49 PM 07/13/2022   11:33 AM  6CIT Screen  What Year? 0 points 0 points 0 points  What month? 0 points 0 points 0 points  What time? 0 points 0 points 0 points  Count back from 20 0 points 0 points 0 points  Months in reverse 0 points 0 points 0 points  Repeat phrase 0 points 0 points 0 points  Total Score 0 points 0 points 0 points    Immunizations Immunization History  Administered Date(s) Administered   Fluad Quad(high Dose 65+) 07/17/2019, 06/24/2020, 07/01/2022   INFLUENZA, HIGH DOSE SEASONAL PF 07/24/2015, 07/15/2016, 07/04/2017, 07/03/2018, 07/03/2019, 07/09/2021, 06/28/2023, 07/04/2024   Influenza Split 07/22/2011, 08/09/2012   Influenza,inj,Quad PF,6+ Mos 06/26/2013, 07/15/2014   Influenza-Unspecified 07/03/2018   Moderna Covid-19 Fall Seasonal Vaccine 84yrs & older 06/28/2023   PFIZER Comirnaty (Gray Top)Covid-19 Tri-Sucrose Vaccine 01/16/2021, 07/21/2022   PFIZER(Purple Top)SARS-COV-2 Vaccination 10/23/2019, 11/12/2019, 06/29/2020, 01/16/2021, 07/09/2021   PPD Test 01/19/2013   Pfizer Covid-19 Vaccine Bivalent Booster 54yrs & up 07/10/2021, 07/21/2022   Pneumococcal Conjugate-13 10/12/2007, 09/18/2013    Pneumococcal Polysaccharide-23 09/29/2016   Respiratory Syncytial Virus Vaccine,Recomb Aduvanted(Arexvy) 10/14/2022   Tdap 02/02/2011, 07/01/2022   Unspecified SARS-COV-2 Vaccination 07/04/2024   Zoster Recombinant(Shingrix) 12/19/2017, 05/31/2018   Zoster, Live 07/11/2012    Screening Tests Health Maintenance  Topic Date Due   COVID-19 Vaccine (10 - Pfizer risk 2025-26 season) 01/01/2025   Medicare Annual Wellness (AWV)  07/20/2025   DTaP/Tdap/Td (3 - Td or Tdap) 07/01/2032   Pneumococcal Vaccine: 50+ Years  Completed   Influenza Vaccine  Completed   DEXA SCAN  Completed   Zoster Vaccines- Shingrix  Completed   Meningococcal B Vaccine  Aged Out   Hepatitis C Screening  Discontinued    Health Maintenance Items Addressed:   Additional Screening:  Vision Screening: Recommended annual ophthalmology exams for early detection of glaucoma and other disorders of the eye. Is the patient up to date with their annual eye exam?  Yes  Who is the provider or what is the name of the office  in which the patient attends annual eye exams? Dr Patrcia  Dental Screening: Recommended annual dental exams for proper oral hygiene  Community Resource Referral / Chronic Care Management: CRR required this visit?  No   CCM required this visit?  No   Plan:    I have personally reviewed and noted the following in the patient's chart:   Medical and social history Use of alcohol , tobacco or illicit drugs  Current medications and supplements including opioid prescriptions. Patient is not currently taking opioid prescriptions. Functional ability and status Nutritional status Physical activity Advanced directives List of other physicians Hospitalizations, surgeries, and ER visits in previous 12 months Vitals Screenings to include cognitive, depression, and falls Referrals and appointments  In addition, I have reviewed and discussed with patient certain preventive protocols, quality metrics,  and best practice recommendations. A written personalized care plan for preventive services as well as general preventive health recommendations were provided to patient.   Rojelio LELON Blush, LPN   [id number]   After Visit Summary: (In Person-Printed) AVS printed and given to the patient  Notes: Nothing significant to report at this time.

## 2024-07-20 NOTE — Patient Instructions (Addendum)
 Ms. Igo,  Thank you for taking the time for your Medicare Wellness Visit. I appreciate your continued commitment to your health goals. Please review the care plan we discussed, and feel free to reach out if I can assist you further.  Medicare recommends these wellness visits once per year to help you and your care team stay ahead of potential health issues. These visits are designed to focus on prevention, allowing your provider to concentrate on managing your acute and chronic conditions during your regular appointments.  Please note that Annual Wellness Visits do not include a physical exam. Some assessments may be limited, especially if the visit was conducted virtually. If needed, we may recommend a separate in-person follow-up with your provider.  Ongoing Care Seeing your primary care provider every 3 to 6 months helps us  monitor your health and provide consistent, personalized care.   Referrals If a referral was made during today's visit and you haven't received any updates within two weeks, please contact the referred provider directly to check on the status.  Recommended Screenings:  Health Maintenance  Topic Date Due   COVID-19 Vaccine (10 - Pfizer risk 2025-26 season) 01/01/2025   Medicare Annual Wellness Visit  07/20/2025   DTaP/Tdap/Td vaccine (3 - Td or Tdap) 07/01/2032   Pneumococcal Vaccine for age over 23  Completed   Flu Shot  Completed   DEXA scan (bone density measurement)  Completed   Zoster (Shingles) Vaccine  Completed   Meningitis B Vaccine  Aged Out   Hepatitis C Screening  Discontinued       07/20/2024    1:58 PM  Advanced Directives  Does Patient Have a Medical Advance Directive? Yes  Type of Estate agent of [place];Living will  Does patient want to make changes to medical advance directive? No - Patient declined  Copy of Healthcare Power of Attorney in Chart? Yes - validated most recent copy scanned in chart (See row information)    Advance Care Planning is important because it: Ensures you receive medical care that aligns with your values, goals, and preferences. Provides guidance to your family and loved ones, reducing the emotional burden of decision-making during critical moments.  Vision: Annual vision screenings are recommended for early detection of glaucoma, cataracts, and diabetic retinopathy. These exams can also reveal signs of chronic conditions such as diabetes and high blood pressure.  Dental: Annual dental screenings help detect early signs of oral cancer, gum disease, and other conditions linked to overall health, including heart disease and diabetes.  Please see the attached documents for additional preventive care recommendations.

## 2024-08-02 DIAGNOSIS — H04123 Dry eye syndrome of bilateral lacrimal glands: Secondary | ICD-10-CM | POA: Diagnosis not present

## 2024-08-02 DIAGNOSIS — H401232 Low-tension glaucoma, bilateral, moderate stage: Secondary | ICD-10-CM | POA: Diagnosis not present

## 2024-08-02 DIAGNOSIS — H26493 Other secondary cataract, bilateral: Secondary | ICD-10-CM | POA: Diagnosis not present

## 2024-08-24 ENCOUNTER — Encounter: Payer: Self-pay | Admitting: Family Medicine

## 2024-08-24 ENCOUNTER — Ambulatory Visit (INDEPENDENT_AMBULATORY_CARE_PROVIDER_SITE_OTHER): Admitting: Family Medicine

## 2024-08-24 VITALS — BP 130/60 | HR 68 | Temp 99.0°F | Ht 67.72 in | Wt 156.1 lb

## 2024-08-24 DIAGNOSIS — Z0000 Encounter for general adult medical examination without abnormal findings: Secondary | ICD-10-CM

## 2024-08-24 DIAGNOSIS — R739 Hyperglycemia, unspecified: Secondary | ICD-10-CM | POA: Diagnosis not present

## 2024-08-24 DIAGNOSIS — R7401 Elevation of levels of liver transaminase levels: Secondary | ICD-10-CM | POA: Diagnosis not present

## 2024-08-24 LAB — COMPREHENSIVE METABOLIC PANEL WITH GFR
ALT: 11 U/L (ref 0–35)
AST: 19 U/L (ref 0–37)
Albumin: 4.2 g/dL (ref 3.5–5.2)
Alkaline Phosphatase: 70 U/L (ref 39–117)
BUN: 22 mg/dL (ref 6–23)
CO2: 30 meq/L (ref 19–32)
Calcium: 10.2 mg/dL (ref 8.4–10.5)
Chloride: 105 meq/L (ref 96–112)
Creatinine, Ser: 0.85 mg/dL (ref 0.40–1.20)
GFR: 63.25 mL/min (ref >60.00)
Glucose, Bld: 75 mg/dL (ref 70–99)
Potassium: 4.3 meq/L (ref 3.5–5.1)
Sodium: 142 meq/L (ref 135–145)
Total Bilirubin: 0.8 mg/dL (ref 0.2–1.2)
Total Protein: 6.2 g/dL (ref 6.0–8.3)

## 2024-08-24 NOTE — Progress Notes (Signed)
 Established Patient Office Visit  Subjective   Patient ID: Megan Reilly, female    DOB: 05/13/1941  Age: 83 y.o. MRN: [id number]  Chief Complaint  Patient presents with   Annual Exam    HPI   Megan Reilly is here for physical exam.  She has history of hypertension, chronic bilateral hearing loss, osteoarthritis involving multiple joints, osteoporosis.  Currently on amlodipine  low dosage 2.5 mg once daily.  Has had a couple of sporadic elevated blood pressures recently.  She has had chronic insomnia issues and currently takes low-dose melatonin which seems to help.  Also does magnesium  supplement.  She had blood chemistries last May which were done following one of her falls/fractures where she had multiple lab abnormalities including low sodium 131 and elevated liver transaminases.  Albumin was low at that time as well.  She feels like she has recovered significantly since then.  Health maintenance reviewed and basically up-to-date.  She does get annual mammograms.  Immunizations are up-to-date.  She has had previous RSV.  Flu vaccine already given.  Aged out of colonoscopy screening.  Ambulating well currently without assistance.  Occasionally uses cane for uneven surfaces.  Past Medical History:  Diagnosis Date   Allergy    Arthritis    Blood transfusion abn reaction or complication, no procedure mishap    83 years old after tonsilectomy   Hypertension    Osteoporosis 04/2019   T score -2.1 improved from prior DEXA   Tibia fracture    left   Vasovagal syncope    2 prior incidences last was in 2015 ; treated in ED , reports no recurrence since then    Past Surgical History:  Procedure Laterality Date   COLONOSCOPY     FOOT SURGERY     bunectomy, hammertoe, bil   LAPAROSCOPIC BILATERAL SALPINGO OOPHERECTOMY     REVERSE SHOULDER ARTHROPLASTY Left 10/14/2023   Procedure: REVERSE SHOULDER ARTHROPLASTY;  Surgeon: Kay Kemps, MD;  Location: [facility];  Service: Orthopedics;   Laterality: Left;    TONSILLECTOMY  10/12/1943   TOTAL HIP ARTHROPLASTY Left 03/05/2024   Procedure: ARTHROPLASTY, HIP, TOTAL, ANTERIOR APPROACH;  Surgeon: Melodi Lerner, MD;  Location: [facility];  Service: Orthopedics;  Laterality: Left;   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   Procedure: RIGHT TOTAL KNEE ARTHROPLASTY;  Surgeon: Melodi Lerner, MD;  Location: [facility];  Service: Orthopedics;  Laterality: Right;   TOTAL KNEE ARTHROPLASTY Left 08/09/2022   Procedure: TOTAL KNEE ARTHROPLASTY;  Surgeon: Melodi Lerner, MD;  Location: [facility];  Service: Orthopedics;  Laterality: Left;    reports that she has never smoked. She has never been exposed to tobacco smoke. She has never used smokeless tobacco. She reports current alcohol  use of about 1.0 standard drink of alcohol  per week. She reports that she does not use drugs. family history includes Breast cancer in her maternal aunt; Diabetes in her father; Heart disease in her mother; Heart disease (age of onset: 60) in her father; Heart failure in her mother; Hyperlipidemia in her father and mother; Stroke in her mother. Allergies  Allergen Reactions   Diphenhydramine  Other (See Comments)    Light-headedness   Hydrochlorothiazide  Other (See Comments)    Fatigue, body aches   Family History  Problem Relation Age of Onset   Heart disease Mother    Hyperlipidemia Mother    Stroke Mother    Heart failure Mother    Diabetes Father        type ll  Heart disease Father 58       sudden death age 68   Hyperlipidemia Father    Breast cancer Maternal Aunt      Review of Systems  Constitutional:  Negative for malaise/fatigue.  Eyes:  Negative for blurred vision.  Respiratory:  Negative for shortness of breath.   Cardiovascular:  Negative for chest pain.  Gastrointestinal:  Negative for abdominal pain.  Genitourinary:  Negative for dysuria.  Neurological:  Negative for dizziness, weakness and headaches.      Objective:     BP  130/60   Pulse 68   Temp 99 F (37.2 C) (Oral)   Ht 5' 7.72 (1.72 m)   Wt 156 lb 1.6 oz (70.8 kg)   SpO2 96%   BMI 23.93 kg/m  BP Readings from Last 3 Encounters:  08/24/24 130/60  07/20/24 122/62  06/08/24 124/64   Wt Readings from Last 3 Encounters:  08/24/24 156 lb 1.6 oz (70.8 kg)  07/20/24 152 lb (68.9 kg)  06/08/24 156 lb 9.6 oz (71 kg)      Physical Exam Vitals reviewed.  Constitutional:      General: She is not in acute distress.    Appearance: She is well-developed. She is not ill-appearing.  HENT:     Right Ear: Tympanic membrane and ear canal normal.     Left Ear: Tympanic membrane and ear canal normal.  Eyes:     Pupils: Pupils are equal, round, and reactive to light.  Neck:     Thyroid : No thyromegaly.     Vascular: No JVD.  Cardiovascular:     Rate and Rhythm: Normal rate and regular rhythm.     Heart sounds:     No gallop.  Pulmonary:     Effort: Pulmonary effort is normal. No respiratory distress.     Breath sounds: Normal breath sounds. No wheezing or rales.  Musculoskeletal:     Cervical back: Neck supple.     Right lower leg: No edema.     Left lower leg: No edema.  Neurological:     Mental Status: She is alert.      No results found for any visits on 08/24/24.  Last CBC Lab Results  Component Value Date   WBC 8.7 04/20/2024   HGB 12.2 04/20/2024   HCT 36.7 04/20/2024   MCV 94.1 04/20/2024   MCH 31.7 03/08/2024   RDW 13.8 04/20/2024   PLT 200.0 04/20/2024   Last metabolic panel Lab Results  Component Value Date   GLUCOSE 148 (H) 03/08/2024   NA 131 (L) 03/08/2024   K 4.1 03/08/2024   CL 102 03/08/2024   CO2 23 03/08/2024   BUN 22 03/08/2024   CREATININE 0.75 03/08/2024   GFRNONAA >60 03/08/2024   CALCIUM  8.4 (L) 03/08/2024   PHOS 2.9 12/10/2013   PROT 5.2 (L) 03/08/2024   ALBUMIN 2.6 (L) 03/08/2024   LABGLOB 2.6 06/01/2023   BILITOT 0.9 03/08/2024   ALKPHOS 115 03/08/2024   AST 90 (H) 03/08/2024   ALT 78 (H)  03/08/2024   ANIONGAP 6 03/08/2024   Last thyroid  functions Lab Results  Component Value Date   TSH 1.70 04/25/2023      The ASCVD Risk score (Arnett DK, et al., 2019) failed to calculate for the following reasons:   The 2019 ASCVD risk score is only valid for ages 55 to 59    Assessment & Plan:   83 year old female here for physical exam.  Chronic medical problems as  above.  Generally doing well and stable at this time.  She does have history of some falls and has taken measures at home to reduce fall risk.  She stays fairly active for age.  Blood pressures sporadically but fairly well-controlled today.  She has had tendencies toward orthostasis in the past.  She has history of elevated liver transaminases from hospitalization back in May and a slightly low sodium at that time as well.  She has had multiple elevated glucose readings looking over recent labs but some of those were not likely fasting.  -Check labs including A1c and CMP - Vaccines reviewed and up-to-date - She plans to continue with annual flu vaccine - Patient getting annual mammograms - Aged out of colonoscopy and Pap smear screening - Handout given on fall prevention   No follow-ups on file.    [name], MD

## 2024-08-27 ENCOUNTER — Ambulatory Visit: Payer: Self-pay | Admitting: Family Medicine

## 2024-08-27 LAB — HEMOGLOBIN A1C: Hgb A1c MFr Bld: 5.6 % (ref 4.6–6.5)

## 2025-07-26 ENCOUNTER — Ambulatory Visit
